# Patient Record
Sex: Female | Born: 1955 | Smoking: Former smoker
Health system: Southern US, Community
[De-identification: ages and names within clinical notes are randomized; demographics above are authoritative.]

## PROBLEM LIST (undated history)

## (undated) DIAGNOSIS — E785 Hyperlipidemia, unspecified: Secondary | ICD-10-CM

## (undated) DIAGNOSIS — I10 Essential (primary) hypertension: Secondary | ICD-10-CM

## (undated) DIAGNOSIS — Z860100 Personal history of colon polyps, unspecified: Secondary | ICD-10-CM

## (undated) DIAGNOSIS — K8020 Calculus of gallbladder without cholecystitis without obstruction: Secondary | ICD-10-CM

## (undated) DIAGNOSIS — M2550 Pain in unspecified joint: Secondary | ICD-10-CM

## (undated) DIAGNOSIS — I2510 Atherosclerotic heart disease of native coronary artery without angina pectoris: Secondary | ICD-10-CM

## (undated) DIAGNOSIS — M2540 Effusion, unspecified joint: Secondary | ICD-10-CM

## (undated) DIAGNOSIS — E669 Obesity, unspecified: Secondary | ICD-10-CM

## (undated) DIAGNOSIS — K5730 Diverticulosis of large intestine without perforation or abscess without bleeding: Secondary | ICD-10-CM

## (undated) DIAGNOSIS — L719 Rosacea, unspecified: Secondary | ICD-10-CM

## (undated) DIAGNOSIS — R42 Dizziness and giddiness: Secondary | ICD-10-CM

## (undated) DIAGNOSIS — H269 Unspecified cataract: Secondary | ICD-10-CM

## (undated) DIAGNOSIS — J302 Other seasonal allergic rhinitis: Secondary | ICD-10-CM

## (undated) DIAGNOSIS — T7840XA Allergy, unspecified, initial encounter: Secondary | ICD-10-CM

## (undated) DIAGNOSIS — T783XXA Angioneurotic edema, initial encounter: Secondary | ICD-10-CM

## (undated) DIAGNOSIS — Z862 Personal history of diseases of the blood and blood-forming organs and certain disorders involving the immune mechanism: Secondary | ICD-10-CM

## (undated) DIAGNOSIS — K648 Other hemorrhoids: Secondary | ICD-10-CM

## (undated) DIAGNOSIS — R112 Nausea with vomiting, unspecified: Secondary | ICD-10-CM

## (undated) DIAGNOSIS — K589 Irritable bowel syndrome without diarrhea: Secondary | ICD-10-CM

## (undated) DIAGNOSIS — K219 Gastro-esophageal reflux disease without esophagitis: Secondary | ICD-10-CM

## (undated) DIAGNOSIS — H9110 Presbycusis, unspecified ear: Secondary | ICD-10-CM

## (undated) DIAGNOSIS — M519 Unspecified thoracic, thoracolumbar and lumbosacral intervertebral disc disorder: Secondary | ICD-10-CM

## (undated) DIAGNOSIS — M949 Disorder of cartilage, unspecified: Secondary | ICD-10-CM

## (undated) DIAGNOSIS — L853 Xerosis cutis: Secondary | ICD-10-CM

## (undated) DIAGNOSIS — I219 Acute myocardial infarction, unspecified: Secondary | ICD-10-CM

## (undated) DIAGNOSIS — M1990 Unspecified osteoarthritis, unspecified site: Secondary | ICD-10-CM

## (undated) DIAGNOSIS — M549 Dorsalgia, unspecified: Secondary | ICD-10-CM

## (undated) DIAGNOSIS — G8929 Other chronic pain: Secondary | ICD-10-CM

## (undated) DIAGNOSIS — F411 Generalized anxiety disorder: Secondary | ICD-10-CM

## (undated) DIAGNOSIS — Z8639 Personal history of other endocrine, nutritional and metabolic disease: Secondary | ICD-10-CM

## (undated) DIAGNOSIS — Z8719 Personal history of other diseases of the digestive system: Secondary | ICD-10-CM

## (undated) DIAGNOSIS — Z9889 Other specified postprocedural states: Secondary | ICD-10-CM

## (undated) DIAGNOSIS — M899 Disorder of bone, unspecified: Secondary | ICD-10-CM

## (undated) HISTORY — DX: Angioneurotic edema, initial encounter: T78.3XXA

## (undated) HISTORY — DX: Obesity, unspecified: E66.9

## (undated) HISTORY — DX: Unspecified thoracic, thoracolumbar and lumbosacral intervertebral disc disorder: M51.9

## (undated) HISTORY — PX: ESOPHAGOGASTRODUODENOSCOPY: SHX1529

## (undated) HISTORY — DX: Disorder of bone, unspecified: M89.9

## (undated) HISTORY — DX: Allergy, unspecified, initial encounter: T78.40XA

## (undated) HISTORY — PX: COLONOSCOPY: SHX174

## (undated) HISTORY — DX: Diverticulosis of large intestine without perforation or abscess without bleeding: K57.30

## (undated) HISTORY — DX: Gastro-esophageal reflux disease without esophagitis: K21.9

## (undated) HISTORY — DX: Presbycusis, unspecified ear: H91.10

## (undated) HISTORY — DX: Irritable bowel syndrome, unspecified: K58.9

## (undated) HISTORY — PX: POLYPECTOMY: SHX149

## (undated) HISTORY — DX: Personal history of other endocrine, nutritional and metabolic disease: Z86.39

## (undated) HISTORY — PX: TONSILLECTOMY: SHX5217

## (undated) HISTORY — DX: Hyperlipidemia, unspecified: E78.5

## (undated) HISTORY — PX: CARDIAC CATHETERIZATION: SHX172

## (undated) HISTORY — DX: Personal history of diseases of the blood and blood-forming organs and certain disorders involving the immune mechanism: Z86.2

## (undated) HISTORY — PX: TONSILLECTOMY: SUR1361

## (undated) HISTORY — DX: Generalized anxiety disorder: F41.1

## (undated) HISTORY — DX: Calculus of gallbladder without cholecystitis without obstruction: K80.20

## (undated) HISTORY — DX: Essential (primary) hypertension: I10

## (undated) HISTORY — DX: Disorder of cartilage, unspecified: M94.9

## (undated) HISTORY — PX: OTHER SURGICAL HISTORY: SHX169

---

## 1998-08-22 ENCOUNTER — Other Ambulatory Visit: Admission: RE | Admit: 1998-08-22 | Discharge: 1998-08-22 | Payer: Self-pay | Admitting: *Deleted

## 1998-09-21 ENCOUNTER — Other Ambulatory Visit: Admission: RE | Admit: 1998-09-21 | Discharge: 1998-09-21 | Payer: Self-pay | Admitting: Obstetrics and Gynecology

## 1998-09-27 ENCOUNTER — Ambulatory Visit (HOSPITAL_COMMUNITY): Admission: RE | Admit: 1998-09-27 | Discharge: 1998-09-27 | Payer: Self-pay | Admitting: Obstetrics and Gynecology

## 1999-03-14 ENCOUNTER — Encounter: Admission: RE | Admit: 1999-03-14 | Discharge: 1999-06-12 | Payer: Self-pay | Admitting: Internal Medicine

## 1999-04-17 ENCOUNTER — Encounter: Payer: Self-pay | Admitting: Internal Medicine

## 1999-06-09 ENCOUNTER — Other Ambulatory Visit: Admission: RE | Admit: 1999-06-09 | Discharge: 1999-06-09 | Payer: Self-pay | Admitting: *Deleted

## 1999-06-27 ENCOUNTER — Encounter: Admission: RE | Admit: 1999-06-27 | Discharge: 1999-06-27 | Payer: Self-pay | Admitting: Internal Medicine

## 1999-06-27 ENCOUNTER — Encounter: Payer: Self-pay | Admitting: Internal Medicine

## 2000-12-06 ENCOUNTER — Other Ambulatory Visit: Admission: RE | Admit: 2000-12-06 | Discharge: 2000-12-06 | Payer: Self-pay | Admitting: *Deleted

## 2001-11-20 ENCOUNTER — Encounter: Admission: RE | Admit: 2001-11-20 | Discharge: 2001-11-20 | Payer: Self-pay | Admitting: Internal Medicine

## 2001-11-20 ENCOUNTER — Encounter: Payer: Self-pay | Admitting: Internal Medicine

## 2002-01-22 ENCOUNTER — Other Ambulatory Visit: Admission: RE | Admit: 2002-01-22 | Discharge: 2002-01-22 | Payer: Self-pay | Admitting: *Deleted

## 2003-06-24 ENCOUNTER — Other Ambulatory Visit: Admission: RE | Admit: 2003-06-24 | Discharge: 2003-06-24 | Payer: Self-pay | Admitting: *Deleted

## 2004-06-09 ENCOUNTER — Encounter: Admission: RE | Admit: 2004-06-09 | Discharge: 2004-06-09 | Payer: Self-pay | Admitting: *Deleted

## 2004-07-11 ENCOUNTER — Ambulatory Visit: Payer: Self-pay | Admitting: Internal Medicine

## 2004-07-18 ENCOUNTER — Ambulatory Visit: Payer: Self-pay | Admitting: Internal Medicine

## 2005-06-11 ENCOUNTER — Encounter: Admission: RE | Admit: 2005-06-11 | Discharge: 2005-06-11 | Payer: Self-pay | Admitting: *Deleted

## 2005-09-03 ENCOUNTER — Ambulatory Visit: Payer: Self-pay | Admitting: Internal Medicine

## 2005-10-15 ENCOUNTER — Encounter: Admission: RE | Admit: 2005-10-15 | Discharge: 2005-10-15 | Payer: Self-pay | Admitting: Internal Medicine

## 2006-06-19 ENCOUNTER — Encounter: Admission: RE | Admit: 2006-06-19 | Discharge: 2006-06-19 | Payer: Self-pay | Admitting: *Deleted

## 2006-09-23 ENCOUNTER — Ambulatory Visit: Payer: Self-pay | Admitting: Internal Medicine

## 2006-09-23 LAB — CONVERTED CEMR LAB
ALT: 30 units/L (ref 0–40)
AST: 26 units/L (ref 0–37)
Albumin: 4.1 g/dL (ref 3.5–5.2)
Alkaline Phosphatase: 58 units/L (ref 39–117)
BUN: 16 mg/dL (ref 6–23)
Basophils Absolute: 0 10*3/uL (ref 0.0–0.1)
Basophils Relative: 0.8 % (ref 0.0–1.0)
Bilirubin Urine: NEGATIVE
Bilirubin, Direct: 0.1 mg/dL (ref 0.0–0.3)
CO2: 34 meq/L — ABNORMAL HIGH (ref 19–32)
Calcium: 9.4 mg/dL (ref 8.4–10.5)
Chloride: 107 meq/L (ref 96–112)
Cholesterol: 225 mg/dL (ref 0–200)
Creatinine, Ser: 0.6 mg/dL (ref 0.4–1.2)
Direct LDL: 126.6 mg/dL
Eosinophils Absolute: 0.1 10*3/uL (ref 0.0–0.6)
Eosinophils Relative: 1.9 % (ref 0.0–5.0)
GFR calc Af Amer: 134 mL/min
GFR calc non Af Amer: 111 mL/min
Glucose, Bld: 97 mg/dL (ref 70–99)
HCT: 42.3 % (ref 36.0–46.0)
HDL: 41.3 mg/dL (ref >39.0)
Hemoglobin, Urine: NEGATIVE
Hemoglobin: 14.4 g/dL (ref 12.0–15.0)
Ketones, ur: NEGATIVE mg/dL
Leukocytes, UA: NEGATIVE
Lymphocytes Relative: 32 % (ref 12.0–46.0)
MCHC: 34.2 g/dL (ref 30.0–36.0)
MCV: 89.8 fL (ref 78.0–100.0)
Monocytes Absolute: 0.3 10*3/uL (ref 0.2–0.7)
Monocytes Relative: 7.7 % (ref 3.0–11.0)
Neutro Abs: 2.3 10*3/uL (ref 1.4–7.7)
Neutrophils Relative %: 57.6 % (ref 43.0–77.0)
Nitrite: NEGATIVE
Platelets: 259 10*3/uL (ref 150–400)
Potassium: 4.4 meq/L (ref 3.5–5.1)
RBC: 4.71 M/uL (ref 3.87–5.11)
RDW: 11.7 % (ref 11.5–14.6)
Sodium: 143 meq/L (ref 135–145)
Specific Gravity, Urine: 1.02 (ref 1.000–1.03)
TSH: 2.28 microintl units/mL (ref 0.35–5.50)
Total Bilirubin: 1 mg/dL (ref 0.3–1.2)
Total CHOL/HDL Ratio: 5.4
Total Protein, Urine: NEGATIVE mg/dL
Total Protein: 7.1 g/dL (ref 6.0–8.3)
Triglycerides: 303 mg/dL (ref 0–149)
Urine Glucose: NEGATIVE mg/dL
Urobilinogen, UA: 0.2 (ref 0.0–1.0)
VLDL: 61 mg/dL — ABNORMAL HIGH (ref 0–40)
WBC: 4 10*3/uL — ABNORMAL LOW (ref 4.5–10.5)
pH: 6.5 (ref 5.0–8.0)

## 2006-10-02 ENCOUNTER — Ambulatory Visit: Payer: Self-pay | Admitting: Internal Medicine

## 2006-10-31 ENCOUNTER — Ambulatory Visit: Payer: Self-pay | Admitting: Internal Medicine

## 2006-12-04 ENCOUNTER — Ambulatory Visit: Payer: Self-pay | Admitting: Internal Medicine

## 2006-12-12 ENCOUNTER — Encounter: Admission: RE | Admit: 2006-12-12 | Discharge: 2006-12-12 | Payer: Self-pay | Admitting: Internal Medicine

## 2006-12-16 ENCOUNTER — Ambulatory Visit: Payer: Self-pay | Admitting: Internal Medicine

## 2006-12-16 LAB — CONVERTED CEMR LAB
Fecal Occult Blood: NEGATIVE
OCCULT 1: NEGATIVE
OCCULT 2: NEGATIVE
OCCULT 3: NEGATIVE
OCCULT 4: NEGATIVE
OCCULT 5: NEGATIVE

## 2007-01-08 ENCOUNTER — Encounter: Payer: Self-pay | Admitting: Internal Medicine

## 2007-01-08 LAB — CONVERTED CEMR LAB
Fecal Occult Blood: NEGATIVE
OCCULT 1: NEGATIVE
OCCULT 2: NEGATIVE
OCCULT 3: NEGATIVE
OCCULT 4: NEGATIVE
OCCULT 5: NEGATIVE

## 2007-06-20 ENCOUNTER — Encounter: Admission: RE | Admit: 2007-06-20 | Discharge: 2007-06-20 | Payer: Self-pay | Admitting: Obstetrics and Gynecology

## 2007-09-08 ENCOUNTER — Telehealth: Payer: Self-pay | Admitting: Internal Medicine

## 2007-10-21 ENCOUNTER — Ambulatory Visit: Payer: Self-pay | Admitting: Internal Medicine

## 2007-10-21 LAB — CONVERTED CEMR LAB
ALT: 57 units/L — ABNORMAL HIGH (ref 0–35)
AST: 43 units/L — ABNORMAL HIGH (ref 0–37)
Albumin: 4.2 g/dL (ref 3.5–5.2)
Alkaline Phosphatase: 53 units/L (ref 39–117)
BUN: 17 mg/dL (ref 6–23)
Basophils Absolute: 0 10*3/uL (ref 0.0–0.1)
Basophils Relative: 0.4 % (ref 0.0–3.0)
Bilirubin Urine: NEGATIVE
Bilirubin, Direct: 0.1 mg/dL (ref 0.0–0.3)
CO2: 27 meq/L (ref 19–32)
Calcium: 9.3 mg/dL (ref 8.4–10.5)
Chloride: 99 meq/L (ref 96–112)
Cholesterol: 286 mg/dL (ref 0–200)
Creatinine, Ser: 0.7 mg/dL (ref 0.4–1.2)
Direct LDL: 159.5 mg/dL
Eosinophils Absolute: 0.1 10*3/uL (ref 0.0–0.7)
Eosinophils Relative: 2 % (ref 0.0–5.0)
GFR calc Af Amer: 112 mL/min
GFR calc non Af Amer: 93 mL/min
Glucose, Bld: 102 mg/dL — ABNORMAL HIGH (ref 70–99)
HCT: 40.8 % (ref 36.0–46.0)
HDL: 34.4 mg/dL — ABNORMAL LOW (ref >39.0)
Hemoglobin, Urine: NEGATIVE
Hemoglobin: 14.3 g/dL (ref 12.0–15.0)
Ketones, ur: NEGATIVE mg/dL
Leukocytes, UA: NEGATIVE
Lymphocytes Relative: 34.8 % (ref 12.0–46.0)
MCHC: 35.1 g/dL (ref 30.0–36.0)
MCV: 89.7 fL (ref 78.0–100.0)
Monocytes Absolute: 0.3 10*3/uL (ref 0.1–1.0)
Monocytes Relative: 8.6 % (ref 3.0–12.0)
Neutro Abs: 1.9 10*3/uL (ref 1.4–7.7)
Neutrophils Relative %: 54.2 % (ref 43.0–77.0)
Nitrite: NEGATIVE
Platelets: 247 10*3/uL (ref 150–400)
Potassium: 3.6 meq/L (ref 3.5–5.1)
RBC: 4.55 M/uL (ref 3.87–5.11)
RDW: 12.4 % (ref 11.5–14.6)
Sodium: 136 meq/L (ref 135–145)
Specific Gravity, Urine: 1.015 (ref 1.000–1.03)
TSH: 2.42 microintl units/mL (ref 0.35–5.50)
Total Bilirubin: 0.9 mg/dL (ref 0.3–1.2)
Total CHOL/HDL Ratio: 8.3
Total Protein, Urine: NEGATIVE mg/dL
Total Protein: 7.1 g/dL (ref 6.0–8.3)
Triglycerides: 404 mg/dL (ref 0–149)
Urine Glucose: NEGATIVE mg/dL
Urobilinogen, UA: 0.2 (ref 0.0–1.0)
VLDL: 81 mg/dL — ABNORMAL HIGH (ref 0–40)
WBC: 3.5 10*3/uL — ABNORMAL LOW (ref 4.5–10.5)
pH: 6 (ref 5.0–8.0)

## 2007-10-28 ENCOUNTER — Encounter: Payer: Self-pay | Admitting: Internal Medicine

## 2007-10-28 DIAGNOSIS — K5730 Diverticulosis of large intestine without perforation or abscess without bleeding: Secondary | ICD-10-CM | POA: Insufficient documentation

## 2007-10-28 DIAGNOSIS — I10 Essential (primary) hypertension: Secondary | ICD-10-CM | POA: Insufficient documentation

## 2007-10-28 DIAGNOSIS — K219 Gastro-esophageal reflux disease without esophagitis: Secondary | ICD-10-CM | POA: Insufficient documentation

## 2007-10-28 DIAGNOSIS — E785 Hyperlipidemia, unspecified: Secondary | ICD-10-CM

## 2007-10-28 DIAGNOSIS — F411 Generalized anxiety disorder: Secondary | ICD-10-CM

## 2007-10-28 DIAGNOSIS — E1169 Type 2 diabetes mellitus with other specified complication: Secondary | ICD-10-CM | POA: Insufficient documentation

## 2007-10-28 HISTORY — DX: Hyperlipidemia, unspecified: E78.5

## 2007-10-28 HISTORY — DX: Essential (primary) hypertension: I10

## 2007-10-28 HISTORY — DX: Diverticulosis of large intestine without perforation or abscess without bleeding: K57.30

## 2007-10-28 HISTORY — DX: Generalized anxiety disorder: F41.1

## 2007-10-28 HISTORY — DX: Gastro-esophageal reflux disease without esophagitis: K21.9

## 2007-10-29 ENCOUNTER — Ambulatory Visit: Payer: Self-pay | Admitting: Internal Medicine

## 2007-10-29 DIAGNOSIS — Z8639 Personal history of other endocrine, nutritional and metabolic disease: Secondary | ICD-10-CM

## 2007-10-29 DIAGNOSIS — M899 Disorder of bone, unspecified: Secondary | ICD-10-CM | POA: Insufficient documentation

## 2007-10-29 DIAGNOSIS — Z862 Personal history of diseases of the blood and blood-forming organs and certain disorders involving the immune mechanism: Secondary | ICD-10-CM

## 2007-10-29 DIAGNOSIS — M949 Disorder of cartilage, unspecified: Secondary | ICD-10-CM

## 2007-10-29 HISTORY — DX: Disorder of bone, unspecified: M89.9

## 2007-10-29 HISTORY — DX: Personal history of other endocrine, nutritional and metabolic disease: Z86.2

## 2007-10-29 HISTORY — DX: Personal history of other endocrine, nutritional and metabolic disease: Z86.39

## 2008-12-08 ENCOUNTER — Ambulatory Visit: Payer: Self-pay | Admitting: Internal Medicine

## 2008-12-08 LAB — CONVERTED CEMR LAB
ALT: 51 units/L — ABNORMAL HIGH (ref 0–35)
AST: 36 units/L (ref 0–37)
Albumin: 4 g/dL (ref 3.5–5.2)
Alkaline Phosphatase: 55 units/L (ref 39–117)
BUN: 17 mg/dL (ref 6–23)
Basophils Absolute: 0 10*3/uL (ref 0.0–0.1)
Basophils Relative: 0.6 % (ref 0.0–3.0)
Bilirubin Urine: NEGATIVE
Bilirubin, Direct: 0.1 mg/dL (ref 0.0–0.3)
CO2: 32 meq/L (ref 19–32)
Calcium: 9.3 mg/dL (ref 8.4–10.5)
Chloride: 103 meq/L (ref 96–112)
Cholesterol: 286 mg/dL — ABNORMAL HIGH (ref 0–200)
Creatinine, Ser: 0.7 mg/dL (ref 0.4–1.2)
Direct LDL: 180.7 mg/dL
Eosinophils Absolute: 0.1 10*3/uL (ref 0.0–0.7)
Eosinophils Relative: 2.1 % (ref 0.0–5.0)
GFR calc non Af Amer: 92.17 mL/min (ref >60)
Glucose, Bld: 99 mg/dL (ref 70–99)
HCT: 40.5 % (ref 36.0–46.0)
HDL: 37.6 mg/dL — ABNORMAL LOW (ref >39.00)
Hemoglobin: 13.8 g/dL (ref 12.0–15.0)
Ketones, ur: NEGATIVE mg/dL
Leukocytes, UA: NEGATIVE
Lymphocytes Relative: 34.8 % (ref 12.0–46.0)
Lymphs Abs: 1 10*3/uL (ref 0.7–4.0)
MCHC: 34.1 g/dL (ref 30.0–36.0)
MCV: 91.5 fL (ref 78.0–100.0)
Monocytes Absolute: 0.3 10*3/uL (ref 0.1–1.0)
Monocytes Relative: 9.2 % (ref 3.0–12.0)
Neutro Abs: 1.5 10*3/uL (ref 1.4–7.7)
Neutrophils Relative %: 53.3 % (ref 43.0–77.0)
Nitrite: NEGATIVE
Platelets: 243 10*3/uL (ref 150.0–400.0)
Potassium: 4.1 meq/L (ref 3.5–5.1)
RBC: 4.42 M/uL (ref 3.87–5.11)
RDW: 11.8 % (ref 11.5–14.6)
Sodium: 141 meq/L (ref 135–145)
Specific Gravity, Urine: >=1.03 (ref 1.000–1.030)
TSH: 2.24 microintl units/mL (ref 0.35–5.50)
Total Bilirubin: 1 mg/dL (ref 0.3–1.2)
Total CHOL/HDL Ratio: 8
Total Protein, Urine: NEGATIVE mg/dL
Total Protein: 6.9 g/dL (ref 6.0–8.3)
Triglycerides: 263 mg/dL — ABNORMAL HIGH (ref 0.0–149.0)
Urine Glucose: NEGATIVE mg/dL
Urobilinogen, UA: 0.2 (ref 0.0–1.0)
VLDL: 52.6 mg/dL — ABNORMAL HIGH (ref 0.0–40.0)
WBC: 2.9 10*3/uL — ABNORMAL LOW (ref 4.5–10.5)
pH: 5.5 (ref 5.0–8.0)

## 2008-12-09 LAB — CONVERTED CEMR LAB
Calcium, Total (PTH): 9.4 mg/dL (ref 8.4–10.5)
HCV Ab: NEGATIVE
Hep A IgM: NEGATIVE
Hep B C IgM: NEGATIVE
Hepatitis B Surface Ag: NEGATIVE
PTH: 55.1 pg/mL (ref 14.0–72.0)

## 2008-12-14 ENCOUNTER — Telehealth: Payer: Self-pay | Admitting: Internal Medicine

## 2008-12-14 ENCOUNTER — Ambulatory Visit: Payer: Self-pay | Admitting: Internal Medicine

## 2009-01-11 ENCOUNTER — Ambulatory Visit: Payer: Self-pay | Admitting: Internal Medicine

## 2009-01-12 LAB — CONVERTED CEMR LAB
ALT: 42 units/L — ABNORMAL HIGH (ref 0–35)
AST: 32 units/L (ref 0–37)
Albumin: 4.1 g/dL (ref 3.5–5.2)
Alkaline Phosphatase: 51 units/L (ref 39–117)
Bilirubin, Direct: 0 mg/dL (ref 0.0–0.3)
Cholesterol: 245 mg/dL — ABNORMAL HIGH (ref 0–200)
Direct LDL: 158.1 mg/dL
HDL: 36.4 mg/dL — ABNORMAL LOW (ref >39.00)
Total Bilirubin: 0.9 mg/dL (ref 0.3–1.2)
Total CHOL/HDL Ratio: 7
Total Protein: 7.1 g/dL (ref 6.0–8.3)
Triglycerides: 215 mg/dL — ABNORMAL HIGH (ref 0.0–149.0)
VLDL: 43 mg/dL — ABNORMAL HIGH (ref 0.0–40.0)

## 2009-05-10 ENCOUNTER — Encounter: Payer: Self-pay | Admitting: Internal Medicine

## 2009-10-25 ENCOUNTER — Telehealth: Payer: Self-pay | Admitting: Internal Medicine

## 2009-12-30 ENCOUNTER — Ambulatory Visit: Payer: Self-pay | Admitting: Internal Medicine

## 2009-12-30 LAB — CONVERTED CEMR LAB
ALT: 41 units/L — ABNORMAL HIGH (ref 0–35)
AST: 35 units/L (ref 0–37)
Albumin: 4.4 g/dL (ref 3.5–5.2)
Alkaline Phosphatase: 61 units/L (ref 39–117)
BUN: 21 mg/dL (ref 6–23)
Basophils Absolute: 0 10*3/uL (ref 0.0–0.1)
Basophils Relative: 0.5 % (ref 0.0–3.0)
Bilirubin Urine: NEGATIVE
Bilirubin, Direct: 0.1 mg/dL (ref 0.0–0.3)
CO2: 30 meq/L (ref 19–32)
Calcium: 9.4 mg/dL (ref 8.4–10.5)
Chloride: 100 meq/L (ref 96–112)
Cholesterol: 243 mg/dL — ABNORMAL HIGH (ref 0–200)
Creatinine, Ser: 0.6 mg/dL (ref 0.4–1.2)
Direct LDL: 171.8 mg/dL
Eosinophils Absolute: 0.1 10*3/uL (ref 0.0–0.7)
Eosinophils Relative: 1.5 % (ref 0.0–5.0)
GFR calc non Af Amer: 111.84 mL/min (ref >60)
Glucose, Bld: 103 mg/dL — ABNORMAL HIGH (ref 70–99)
HCT: 39.9 % (ref 36.0–46.0)
HDL: 40.4 mg/dL (ref >39.00)
Hemoglobin, Urine: NEGATIVE
Hemoglobin: 13.9 g/dL (ref 12.0–15.0)
Ketones, ur: NEGATIVE mg/dL
Lymphocytes Relative: 37.9 % (ref 12.0–46.0)
Lymphs Abs: 1.3 10*3/uL (ref 0.7–4.0)
MCHC: 34.8 g/dL (ref 30.0–36.0)
MCV: 90.9 fL (ref 78.0–100.0)
Monocytes Absolute: 0.3 10*3/uL (ref 0.1–1.0)
Monocytes Relative: 9.4 % (ref 3.0–12.0)
Neutro Abs: 1.7 10*3/uL (ref 1.4–7.7)
Neutrophils Relative %: 50.7 % (ref 43.0–77.0)
Nitrite: NEGATIVE
Platelets: 232 10*3/uL (ref 150.0–400.0)
Potassium: 4.4 meq/L (ref 3.5–5.1)
RBC: 4.39 M/uL (ref 3.87–5.11)
RDW: 12.2 % (ref 11.5–14.6)
Sodium: 138 meq/L (ref 135–145)
Specific Gravity, Urine: 1.015 (ref 1.000–1.030)
TSH: 2.73 microintl units/mL (ref 0.35–5.50)
Total Bilirubin: 0.9 mg/dL (ref 0.3–1.2)
Total CHOL/HDL Ratio: 6
Total Protein, Urine: NEGATIVE mg/dL
Total Protein: 7.2 g/dL (ref 6.0–8.3)
Triglycerides: 177 mg/dL — ABNORMAL HIGH (ref 0.0–149.0)
Urine Glucose: NEGATIVE mg/dL
Urobilinogen, UA: 0.2 (ref 0.0–1.0)
VLDL: 35.4 mg/dL (ref 0.0–40.0)
WBC: 3.3 10*3/uL — ABNORMAL LOW (ref 4.5–10.5)
pH: 6.5 (ref 5.0–8.0)

## 2010-01-04 ENCOUNTER — Encounter: Payer: Self-pay | Admitting: Internal Medicine

## 2010-01-04 ENCOUNTER — Ambulatory Visit: Payer: Self-pay | Admitting: Internal Medicine

## 2010-01-04 DIAGNOSIS — H9110 Presbycusis, unspecified ear: Secondary | ICD-10-CM

## 2010-01-04 HISTORY — DX: Presbycusis, unspecified ear: H91.10

## 2010-01-11 ENCOUNTER — Encounter: Admission: RE | Admit: 2010-01-11 | Discharge: 2010-01-11 | Payer: Self-pay | Admitting: Obstetrics and Gynecology

## 2010-01-16 ENCOUNTER — Encounter (INDEPENDENT_AMBULATORY_CARE_PROVIDER_SITE_OTHER): Payer: Self-pay | Admitting: *Deleted

## 2010-01-19 ENCOUNTER — Encounter: Admission: RE | Admit: 2010-01-19 | Discharge: 2010-01-19 | Payer: Self-pay | Admitting: Obstetrics and Gynecology

## 2010-01-25 ENCOUNTER — Encounter: Admission: RE | Admit: 2010-01-25 | Discharge: 2010-01-25 | Payer: Self-pay | Admitting: Internal Medicine

## 2010-02-03 ENCOUNTER — Encounter: Admission: RE | Admit: 2010-02-03 | Discharge: 2010-02-03 | Payer: Self-pay | Admitting: Obstetrics and Gynecology

## 2010-04-10 ENCOUNTER — Ambulatory Visit: Admit: 2010-04-10 | Payer: Self-pay | Admitting: Internal Medicine

## 2010-04-22 ENCOUNTER — Encounter: Payer: Self-pay | Admitting: Obstetrics and Gynecology

## 2010-04-23 ENCOUNTER — Encounter: Payer: Self-pay | Admitting: Internal Medicine

## 2010-05-02 NOTE — Progress Notes (Signed)
Summary: rx/ divon   Phone Note Call from Patient Call back at Home Phone [phone]   Caller: Patient/[phone] Call For: Dr Jonny Ruiz Summary of Call: per pt call requseting an rx for Divon for mail order requseting a 90 day supply ,pt states she has an appt scheduled for July 21,09   Patient's chart has been requested. please CALL patient when ready for pick up    Initial call taken by: Shelbie Proctor,  September 08, 2007 9:52 AM    New/Updated Medications: DIOVAN HCT 160-12.5 MG  TABS (VALSARTAN-HYDROCHLOROTHIAZIDE) 1 by mouth qd   Prescriptions: DIOVAN HCT 160-12.5 MG  TABS (VALSARTAN-HYDROCHLOROTHIAZIDE) 1 by mouth qd  #90 x 3   Entered by:   Maris Berger   Authorized by:   Corwin Levins MD   Signed by:   Maris Berger on 09/08/2007   Method used:   Print then Give to Patient   RxID:   [id number]

## 2010-05-02 NOTE — Assessment & Plan Note (Signed)
Summary: PHYSICAL-$50-STC   Vital Signs:  Patient Profile:   55 Years Old Female Weight:      179 pounds Temp:     98.0 degrees F oral Pulse rate:   73 / minute BP sitting:   128 / 85  (right arm) Cuff size:   regular  Vitals Entered By: Jerilynn Mages (October 29, 2007 1:12 PM)                 Preventive Care Screening  Colonoscopy:    Date:  04/17/1999    Next Due:  04/2009    Results:  Diverticulosis    Chief Complaint:  CPX.  History of Present Illness: overall doing well, did not take the pravachol since last visit, but is willing to try now    Updated Prior Medication List: DIOVAN HCT 160-12.5 MG  TABS (VALSARTAN-HYDROCHLOROTHIAZIDE) 1 by mouth once daily OMEPRAZOLE 20 MG  CPDR (OMEPRAZOLE) 2 by mouth once daily PRAVACHOL 40 MG  TABS (PRAVASTATIN SODIUM) TAKE 2 by mouth QD CLARINEX 5 MG  TABS (DESLORATADINE) Take 1 by mouth once daily  Current Allergies (reviewed today): ! SULFA ! PENICILLIN ! LIPITOR ! ZOCOR ! CRESTOR  Past Medical History:    Reviewed history from 10/28/2007 and no changes required:       GERD       Hyperlipidemia       Anxiety       IBS       Diverticulosis       Obesity       Migraine       Diverticulosis, colon       Hypertension       low vit D       Osteopenia       hx of lumbar disc disease  Past Surgical History:    Reviewed history from 06/26/2007 and no changes required:       Tonsillectomy   Family History:    Reviewed history and no changes required:       mother with afib, elev chol, stroke       father with afib       grandmother and sister with low thyroid  Social History:    Reviewed history and no changes required:       Former Smoker       Alcohol use-yes       Married       work - Educational psychologist       no children   Risk Factors:  Tobacco use:  quit Alcohol use:  yes  Colonoscopy History:     Date of Last Colonoscopy:  04/17/1999    Results:  Diverticulosis    Review of Systems   all otherwise negative    Physical Exam  General:     alert and overweight-appearing.   Head:     Normocephalic and atraumatic without obvious abnormalities. No apparent alopecia or balding. Eyes:     No corneal or conjunctival inflammation noted. EOMI. Perrla.  Ears:     External ear exam shows no significant lesions or deformities.  Otoscopic examination reveals clear canals, tympanic membranes are intact bilaterally without bulging, retraction, inflammation or discharge. Hearing is grossly normal bilaterally. Nose:     External nasal examination shows no deformity or inflammation. Nasal mucosa are pink and moist without lesions or exudates. Mouth:     Oral mucosa and oropharynx without lesions or exudates.  Teeth in good repair. Neck:  No deformities, masses, or tenderness noted. Lungs:     Normal respiratory effort, chest expands symmetrically. Lungs are clear to auscultation, no crackles or wheezes. Heart:     Normal rate and regular rhythm. S1 and S2 normal without gallop, murmur, click, rub or other extra sounds. Abdomen:     Bowel sounds positive,abdomen soft and non-tender without masses, organomegaly or hernias noted. Msk:     No deformity or scoliosis noted of thoracic or lumbar spine.   Extremities:     No clubbing, cyanosis, edema, or deformity noted with normal full range of motion of all joints.   Neurologic:     No cranial nerve deficits noted. Station and gait are normal. Plantar reflexes are down-going bilaterally. DTRs are symmetrical throughout. Sensory, motor and coordinative functions appear intact.    Impression & Recommendations:  Problem # 1:  Preventive Health Care (ICD-V70.0) Overall doing well, up to date, counseled on routine health concerns for screening and prevention, immunizations up to date or declined, labs reviewed, ecg reviewed  Orders: EKG w/ Interpretation (93000)   Problem # 2:  HYPERTENSION (ICD-401.9)  Her updated medication  list for this problem includes:    Diovan Hct 160-12.5 Mg Tabs (Valsartan-hydrochlorothiazide) .Marland Kitchen... 1 by mouth once daily  BP today: 128/85 Prior BP: 114/70 (10/31/2006)  Labs Reviewed: Creat: 0.7 (10/21/2007) Chol: 286 (10/21/2007)   HDL: 34.4 (10/21/2007)   LDL: DEL (10/21/2007)   TG: 404 (10/21/2007) stable overall by hx and exam, ok to continue meds/tx as is   Complete Medication List: 1)  Diovan Hct 160-12.5 Mg Tabs (Valsartan-hydrochlorothiazide) .Marland Kitchen.. 1 by mouth once daily 2)  Omeprazole 20 Mg Cpdr (Omeprazole) .... 2 by mouth once daily 3)  Pravachol 40 Mg Tabs (Pravastatin sodium) .... Take 2 by mouth qd 4)  Clarinex 5 Mg Tabs (Desloratadine) .... Take 1 by mouth once daily   Patient Instructions: 1)  Please take all new medications as prescribed 2)  Continue all medications that you may have been taking previously 3)  Please return for LAB only in 4 wks: 4)  Lipid Panel prior to visit, ICD-9: 272.0 5)  Hepatic Panel prior to visit, ICD-9: v58.69 6)  Acute Hepatitis Panel 794.8 7)  PTH level - parathyroid hormone level - 733.90 8)  Please schedule a follow-up appointment in 1 year.   Prescriptions: CLARINEX 5 MG  TABS (DESLORATADINE) Take 1 by mouth once daily  #90 x 3   Entered and Authorized by:   Corwin Levins MD   Signed by:   Corwin Levins MD on 10/29/2007   Method used:   Print then Give to Patient   RxID:   [id number] DIOVAN HCT 160-12.5 MG  TABS (VALSARTAN-HYDROCHLOROTHIAZIDE) 1 by mouth once daily  #90 x 3   Entered and Authorized by:   Corwin Levins MD   Signed by:   Corwin Levins MD on 10/29/2007   Method used:   Print then Give to Patient   RxID:   [id number] OMEPRAZOLE 20 MG  CPDR (OMEPRAZOLE) 2 by mouth once daily  #180 x 3   Entered and Authorized by:   Corwin Levins MD   Signed by:   Corwin Levins MD on 10/29/2007   Method used:   Print then Give to Patient   RxID:   [id number] PRAVACHOL 40 MG  TABS (PRAVASTATIN SODIUM) TAKE 2 by mouth  QD  #180 x 3   Entered and Authorized by:   Corwin Levins  MD   Signed by:   Corwin Levins MD on 10/29/2007   Method used:   Print then Give to Patient   RxID:   [id number]  ]

## 2010-05-02 NOTE — Consult Note (Signed)
Summary: [facility]  [facility]   Imported By: Lester [place] 05/12/2009 10:29:24  _____________________________________________________________________  External Attachment:    Type:   Image     Comment:   External Document

## 2010-05-02 NOTE — Procedures (Signed)
Summary: Gastroenterology EGD  Gastroenterology EGD   Imported By: Francee Piccolo CMA 06/26/2007 17:11:47  _____________________________________________________________________  External Attachment:    Type:   Image     Comment:   External Document

## 2010-05-02 NOTE — Procedures (Signed)
Summary: Gastroenterology Colon  Gastroenterology Colon   Imported By: Francee Piccolo CMA 06/26/2007 17:11:05  _____________________________________________________________________  External Attachment:    Type:   Image     Comment:   External Document

## 2010-05-02 NOTE — Assessment & Plan Note (Signed)
Summary: CPX /NWS $50   Vital Signs:  Patient profile:   55 year old female Height:      63.75 inches Weight:      181.25 pounds BMI:     31.47 O2 Sat:      97 % Temp:     97.8 degrees F oral Pulse rate:   73 / minute BP sitting:   146 / 82  (left arm) Cuff size:   regular  Vitals Entered By: Windell [place] (December 14, 2008 8:26 AM)  Preventive Care Screening  Last Tetanus Booster:    Date:  04/02/2004    Results:  Tdap      gets flu shot at work  CC: cpx   CC:  cpx.  History of Present Illness: gained  lbs overall, had recent vacation with falling off the wagin on the diet; BP at home usually < 140/90;  Pt denies CP, sob, doe, wheezing, orthopnea, pnd, worsening LE edema, palps, dizziness or syncope   Pt denies new neuro symptoms such as headache, facial or extremity weakness   Trying to follow low chol diet, wt overall stable   Problems Prior to Update: 1)  Preventive Health Care  (ICD-V70.0) 2)  Osteopenia  (ICD-733.90) 3)  Preventive Health Care  (ICD-V70.0) 4)  Liver Function Tests, Abnormal, Hx of  (ICD-V12.2) 5)  Hypertension  (ICD-401.9) 6)  Hyperlipidemia  (ICD-272.4) 7)  Gerd  (ICD-530.81) 8)  Diverticulosis, Colon  (ICD-562.10) 9)  Anxiety  (ICD-300.00)  Medications Prior to Update: 1)  Diovan Hct 160-12.5 Mg  Tabs (Valsartan-Hydrochlorothiazide) .Marland Kitchen.. 1 By Mouth Once Daily 2)  Omeprazole 20 Mg  Cpdr (Omeprazole) .... 2 By Mouth Once Daily 3)  Pravachol 40 Mg  Tabs (Pravastatin Sodium) .... Take 2 By Mouth Qd 4)  Clarinex 5 Mg  Tabs (Desloratadine) .... Take 1 By Mouth Once Daily  Current Medications (verified): 1)  Diovan Hct 160-12.5 Mg  Tabs (Valsartan-Hydrochlorothiazide) .Marland Kitchen.. 1 By Mouth Once Daily 2)  Lansoprazole 30 Mg Cpdr (Lansoprazole) .Marland Kitchen.. 1 By Mouth Once Daily 3)  Pravachol 40 Mg  Tabs (Pravastatin Sodium) .... Take 1 By Mouth Once Daily 4)  Clarinex 5 Mg  Tabs (Desloratadine) .... Take 1 By Mouth Once Daily 5)  Aspir-Low 81 Mg Tbec  (Aspirin) .Marland Kitchen.. 1po Once Daily  Allergies (verified): 1)  ! Sulfa 2)  ! Penicillin 3)  ! Lipitor 4)  ! Zocor 5)  ! 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         all otherwise negative - 12 system review done   Physical Exam  General:  alert and overweight-appearing.   Head:  normocephalic and atraumatic.   Eyes:  vision grossly intact, pupils  equal, and pupils round.   Ears:  R ear normal and L ear normal.   Nose:  no external deformity and no nasal discharge.   Mouth:  no gingival abnormalities and pharynx pink and moist.   Neck:  supple and no masses.   Lungs:  normal respiratory effort and normal breath sounds.   Heart:  normal rate and regular rhythm.   Abdomen:  soft, non-tender, and normal bowel sounds.   Msk:  no joint tenderness and no joint swelling.   Extremities:  no edema, no erythema  Neurologic:  cranial nerves II-XII  intact and strength normal in all extremities.     Impression & Recommendations:  Problem # 1:  Preventive Health Care (ICD-V70.0)  Overall doing well, up to date, counseled on routine health concerns for screening and prevention, immunizations up to date or declined, labs reviewed, ecg reviewed   Orders: EKG w/ Interpretation (93000)  Problem # 2:  HYPERLIPIDEMIA (ICD-272.4)  Her updated medication list for this problem includes:    Pravachol 40 Mg Tabs (Pravastatin sodium) .Marland Kitchen... Take 1 by mouth once daily  Labs Reviewed: SGOT: 36 (12/08/2008)   SGPT: 51 (12/08/2008)   HDL:37.60 (12/08/2008), 34.4 (10/21/2007)  LDL:DEL (10/21/2007), DEL (09/23/2006)  Chol:286 (12/08/2008), 286 (10/21/2007)  Trig:263.0 (12/08/2008), 404 (10/21/2007) start tx, intolerant to other statins, but will try, and check labs 4 wks  Complete Medication List: 1)  Diovan Hct 160-12.5 Mg Tabs (Valsartan-hydrochlorothiazide) .Marland Kitchen.. 1 by mouth once daily 2)  Lansoprazole 30 Mg Cpdr (Lansoprazole) .Marland Kitchen.. 1 by mouth once daily 3)  Pravachol 40 Mg Tabs (Pravastatin sodium) .... Take 1 by mouth once daily 4)  Clarinex 5 Mg Tabs (Desloratadine) .... Take 1 by mouth once daily 5)  Aspir-low 81 Mg Tbec (Aspirin) .Marland Kitchen.. 1po once daily  Patient Instructions: 1)  start the pravachol 40 mg per day 2)  please return for LAB only in 4 wks: 3)  Hepatic Panel prior to visit, ICD-9: v58.69 4)  Lipid Panel prior to visit, ICD-9: 272.0 5)  Take an Aspirin every day  81 mg - 1 per day - COATED only 6)  please followup iwth your GYN as you do for the bone density and  female exams 7)  Please schedule a follow-up appointment in 1 year or sooner if needed Prescriptions: CLARINEX 5 MG  TABS (DESLORATADINE) Take 1 by mouth once daily  #90 x 3   Entered and Authorized by:   Corwin Levins MD   Signed by:   Corwin Levins MD on 12/14/2008   Method used:   Print then Give to Patient   RxID:   [id number] LANSOPRAZOLE 30 MG CPDR  (LANSOPRAZOLE) 1 by mouth once daily  #90 x 3   Entered and Authorized by:   Corwin Levins MD   Signed by:   Corwin Levins MD on 12/14/2008   Method used:   Print then Give to Patient   RxID:   [id number] DIOVAN HCT 160-12.5 MG  TABS (VALSARTAN-HYDROCHLOROTHIAZIDE) 1 by mouth once daily  #90 x 3   Entered and Authorized by:   Corwin Levins MD   Signed by:   Corwin Levins MD on 12/14/2008   Method used:   Print then Give to Patient   RxID:   [id number] PRAVACHOL 40 MG  TABS (PRAVASTATIN SODIUM) TAKE 1 by mouth once daily  #90 x 3   Entered and Authorized by:   Corwin Levins MD   Signed by:  Corwin Levins MD on 12/14/2008   Method used:   Print then Give to Patient   RxID:   [id number]

## 2010-05-02 NOTE — Assessment & Plan Note (Signed)
Summary: CPX / NWS  #   Vital Signs:  Patient profile:   55 year old female Height:      63 inches Weight:      179 pounds BMI:     31.82 O2 Sat:      96 % on Room air Temp:     98.3 degrees F oral Pulse rate:   72 / minute BP sitting:   130 / 80  (left arm) Cuff size:   regular  Vitals Entered By: Zella Ball Ewing CMA (AAMA) (January 04, 2010 1:13 PM)  O2 Flow:  Room air  Preventive Care Screening     due to see GYN in late oct for pap and mamogram   CC: Adult Physical/RE/wellness   CC:  Adult Physical/RE/wellness.  History of Present Illness: here for wellness, overall doing well, presbyacusis persits however, even grad worse - saw ENT fev 2011 who suggested MRI for persistent symptoms;  Pt denies new neuro symptoms such as headache, facial or extremity weakness  Pt denies CP, worsening sob, doe, wheezing, orthopnea, pnd, worsening LE edema, palps, dizziness or syncope  No fever, wt loss, night sweats, loss of appetite or other constitutional symptoms  Pt denies polydipsia, polyuria,   Overall good compliance with meds, trying to follow low chol diet, wt stable, little excercise however   Preventive Screening-Counseling & Management      Drug Use:  no.    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 1 By Mouth Once Daily 2)  Lansoprazole 30 Mg Cpdr (Lansoprazole) .Marland Kitchen.. 1 By Mouth Once Daily 3)  Pravachol 40 Mg  Tabs (Pravastatin Sodium) .... Take 1 By Mouth Once Daily 4)  Clarinex 5 Mg  Tabs (Desloratadine) .... Take 1 By Mouth Once Daily 5)   Aspir-Low 81 Mg Tbec (Aspirin) .Marland Kitchen.. 1po Once Daily  Current Medications (verified): 1)  Diovan Hct 160-12.5 Mg  Tabs (Valsartan-Hydrochlorothiazide) .Marland Kitchen.. 1 By Mouth Once Daily 2)  Lansoprazole 30 Mg Cpdr (Lansoprazole) .Marland Kitchen.. 1 By Mouth Once Daily 3)  Pravachol 40 Mg  Tabs (Pravastatin Sodium) .... Take 1 By Mouth Once Daily 4)  Clarinex 5 Mg  Tabs (Desloratadine) .... Take 1 By Mouth Once Daily 5)  Aspir-Low 81 Mg Tbec (Aspirin) .Marland Kitchen.. 1po Once Daily 6)  Welchol 3.75 Gm Pack (Colesevelam Hcl) .Marland Kitchen.. 1 Pack By Mouth Once Daily in 8 Oz Fluid  Allergies (verified): 1)  ! Sulfa 2)  ! Penicillin 3)  ! Lipitor 4)  ! Zocor 5)  ! 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         all otherwise negative per  pt -    Physical Exam  General:  alert and overweight-appearing.   Head:  normocephalic and atraumatic.   Eyes:  vision grossly intact, pupils equal, and pupils round.   Ears:  R ear normal and L ear normal.   Nose:  no external deformity and no nasal discharge.   Mouth:  no gingival abnormalities and pharynx pink and moist.   Neck:  supple and no masses.   Lungs:  normal respiratory effort and normal breath sounds.   Heart:  normal rate and regular rhythm.   Abdomen:  soft, non-tender, and normal bowel sounds.   Msk:  no joint tenderness and no joint swelling.   Extremities:  no edema, no erythema  Neurologic:  cranial nerves II-XII intact and strength normal in all extremities.   Skin:  color normal and no rashes.   Psych:  not anxious appearing and not depressed appearing.     Impression & Recommendations:  Problem # 1:  Preventive Health Care (ICD-V70.0) Overall doing well, age appropriate education and counseling updated and referral for appropriate preventive services done unless declined, immunizations up to date or declined, diet counseling done if overweight, urged to quit smoking if smokes , most recent labs reviewed and current ordered if appropriate, ecg reviewed or declined (interpretation per ECG scanned in the EMR if done); information regarding Medicare Prevention requirements given if appropriate; speciality referrals updated as appropriate  Orders: EKG w/ Interpretation (93000) Gastroenterology Referral (GI)  Problem # 2:  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 Take 1 by mouth once daily    Welchol 3.75 Gm Pack (Colesevelam hcl) .Marland Kitchen... 1 pack by mouth once daily in 8 oz fluid to  add the welchol for uncontrolled ; Pt to continue diet efforts, good med tolerance; to check labs - goal LDL less than 100   Labs Reviewed: SGOT: 35 (12/30/2009)   SGPT: 41 (12/30/2009)   HDL:40.40 (12/30/2009), 36.40 (01/11/2009)  LDL:DEL (10/21/2007), DEL (09/23/2006)  Chol:243 (12/30/2009), 245 (01/11/2009)  Trig:177.0 (12/30/2009), 215.0 (01/11/2009)  Complete Medication List: 1)  Diovan Hct 160-12.5 Mg Tabs (Valsartan-hydrochlorothiazide) .Marland Kitchen.. 1 by mouth once daily 2)  Lansoprazole 30 Mg Cpdr (Lansoprazole) .Marland Kitchen.. 1 by mouth once daily 3)  Pravachol 40 Mg Tabs (Pravastatin sodium) .... Take 1 by mouth once daily 4)  Clarinex 5 Mg Tabs (Desloratadine) .... Take 1 by mouth once daily 5)  Aspir-low 81 Mg Tbec (Aspirin) .Marland Kitchen.. 1po once daily 6)  Welchol 3.75 Gm Pack (Colesevelam hcl) .Marland Kitchen.. 1 pack by mouth once daily in 8 oz fluid  Patient Instructions: 1)  You will be contacted about the referral(s) to: Head MRI 2)  Please take all new medications as prescribed 3)  Continue all  previous medications as before this visit 4)  Please return in 4 wks for Lab only:   5)  Hepatic Panel prior to visit, ICD-9: v58.69 6)  Lipid Panel prior to visit, ICD-9: 272.0 7)  You will be contacted about the referral(s) to: colonoscopy 8)  Please schedule a follow-up appointment in 1 year, or sooner if needed Prescriptions: CLARINEX 5 MG  TABS (DESLORATADINE) Take 1 by mouth once daily  #90 x 3   Entered and Authorized by:   Corwin Levins MD   Signed by:   Corwin Levins MD on 01/04/2010   Method used:   Print then Give to Patient   RxID:   [id number] PRAVACHOL 40 MG  TABS (PRAVASTATIN SODIUM) TAKE 1 by mouth once daily  #90 x 3   Entered and Authorized by:   Corwin Levins MD   Signed by:   Corwin Levins MD on 01/04/2010   Method used:   Print then Give to Patient   RxID:   [id number] LANSOPRAZOLE 30 MG CPDR (LANSOPRAZOLE) 1 by mouth once daily  #90 x 3   Entered and Authorized by:   Corwin Levins  MD   Signed by:   Corwin Levins MD on 01/04/2010   Method used:   Print then Give to Patient   RxID:   [id number] DIOVAN HCT 160-12.5 MG  TABS (VALSARTAN-HYDROCHLOROTHIAZIDE) 1 by mouth once daily  #90 x 3   Entered and Authorized by:   Corwin Levins MD   Signed by:   Corwin Levins MD on 01/04/2010   Method used:   Print then Give to Patient   RxID:   [id number] WELCHOL 3.75 GM PACK (COLESEVELAM HCL) 1 pack by mouth once daily in 8 oz fluid  #90 x 3   Entered and Authorized by:   Corwin Levins MD   Signed by:   Corwin Levins MD on 01/04/2010   Method used:   Print then Give to Patient   RxID:   [id number]

## 2010-05-02 NOTE — Letter (Signed)
Summary: Pre Visit Letter Revised  [facility]  [address] [place], [place] [address]   Phone: [phone]  Fax: [phone]        01/16/2010 MRN: [id number] Heather Merritt [address] [place], [place]  [address]             Procedure Date:  02-28-10   Welcome to the Gastroenterology Division at [facility].    You are scheduled to see a nurse for your pre-procedure visit on 02-14-10 at 8:30a.m. on the 3rd floor at [facility], [address]  We ask that you try to arrive at our office 15 minutes prior to your appointment time to allow for check-in.  Please take a minute to review the attached form.  If you answer "Yes" to one or more of the questions on the first page, we ask that you call the person listed at your earliest opportunity.  If you answer "No" to all of the questions, please complete the rest of the form and bring it to your appointment.    Your nurse visit will consist of discussing your medical and surgical history, your immediate family medical history, and your medications.   If you are unable to list all of your medications on the form, please bring the medication bottles to your appointment and we will list them.  We will need to be aware of both prescribed and over the counter drugs.  We will need to know exact dosage information as well.    Please be prepared to read and sign documents such as consent forms, a financial agreement, and acknowledgement forms.  If necessary, and with your consent, a friend or relative is welcome to sit-in on the nurse visit with you.  Please bring your insurance card so that we may make a copy of it.  If your insurance requires a referral to see a specialist, please bring your referral form from your primary care physician.  No co-pay is required for this nurse visit.     If you cannot keep your appointment, please call [phone] to cancel or reschedule prior to your appointment date.  This allows  [place] the opportunity to schedule an appointment for another patient in need of care.    Thank you for choosing [facility] for your medical needs.  We appreciate the opportunity to care for you.  Please visit [place] at our website  to learn more about our practice.  Sincerely, The Gastroenterology Division

## 2010-05-02 NOTE — Progress Notes (Signed)
Summary: PT ?  Phone Note Call from Patient Call back at [facility] Phone [phone]   Caller: Patient Summary of Call: pt called stating that she received saple of Diovan 160/25 but her normal dose is 160/12.5. pt would like to know if this is okay or should she bring them back for 160-12.5? Initial call taken by: Margaret Pyle, CMA,  December 14, 2008 11:10 AM  Follow-up for Phone Call        sorry - pt is correct, I reviewed this and she was given the incorrect samples;  I have 2 more packages of the correct samples for her to return to pick up - to dahlia to arrange Follow-up by: Corwin Levins MD,  December 14, 2008 12:49 PM  Additional Follow-up for Phone Call Additional follow up Details #1::        left message on machine for pt to return my call  samples in front office for pt pick up Additional Follow-up by: Margaret Pyle, CMA,  December 14, 2008 1:06 PM    Additional Follow-up for Phone Call Additional follow up Details #2::    spoke with pt. she will come back to office to return wrong samples for correct ones. Follow-up by: Margaret Pyle, CMA,  December 15, 2008 8:45 AM

## 2010-05-02 NOTE — Progress Notes (Signed)
Summary: BP med  Phone Note Call from Patient Call back at Work Phone [phone]   Caller: Patient Summary of Call: Pt called stating that she may have taken her BP medications twice this morning, she is not sure. Pt states that she feels fine but does not know if there is something she should/could do to counter act any possible symptoms. Please advise.  Follow-up for Phone Call        should be ok this time, but should watch for any dizziness or undue weakness, and avoid dehydration since she took double dose of her mild fluid pill (in with the diovan hct);  it would be nice to check BP at a local drug store or home later in the day before dinner and call if < 100 on the top number;  ok to cont meds as per usual tomorrow Follow-up by: Corwin Levins MD,  October 25, 2009 9:36 AM  Additional Follow-up for Phone Call Additional follow up Details #1::        Pt informed and will call with her BP before 5pm or tomorrow morning if low. Additional Follow-up by: Margaret Pyle, CMA,  October 25, 2009 10:20 AM

## 2010-05-16 ENCOUNTER — Encounter (INDEPENDENT_AMBULATORY_CARE_PROVIDER_SITE_OTHER): Payer: Self-pay | Admitting: *Deleted

## 2010-05-18 ENCOUNTER — Encounter: Payer: Self-pay | Admitting: Internal Medicine

## 2010-05-24 NOTE — Letter (Signed)
Summary: [facility] Instructions  [facility]  [address] [place], [place] [address]   Phone: [phone]  Fax: [phone]       JINAN BIGGINS    Aug 09, 1955    MRN: [id number]        Procedure Day Dorna Bloom:  Farrell Ours 06/02/10     Arrival Time:  8:00am     Procedure Time:  9:00am     Location of Procedure:                    [facility] (4th Floor)                       PREPARATION FOR COLONOSCOPY WITH MOVIPREP   Starting 5 days prior to your procedure  SUNDAY 02/26  do not eat nuts, seeds, popcorn, corn, beans, peas,  salads, or any raw vegetables.  Do not take any fiber supplements (e.g. Metamucil, Citrucel, and Benefiber).  THE DAY BEFORE YOUR PROCEDURE         DATE: THURSDAY 03/01  1.  Drink clear liquids the entire day-NO SOLID FOOD  2.  Do not drink anything colored red or purple.  Avoid juices with pulp.  No orange juice.  3.  Drink at least 64 oz. (8 glasses) of fluid/clear liquids during the day to prevent dehydration and help the prep work efficiently.  CLEAR LIQUIDS INCLUDE: Water Jello Ice Popsicles Tea (sugar ok, no milk/cream) Powdered fruit flavored drinks Coffee (sugar ok, no milk/cream) Gatorade Juice: apple, white grape, white cranberry  Lemonade Clear bullion, consomm, broth Carbonated beverages (any kind) Strained chicken noodle soup Hard Candy                             4.  In the morning, mix first dose of MoviPrep solution:    Empty 1 Pouch A and 1 Pouch B into the disposable container    Add lukewarm drinking water to the top line of the container. Mix to dissolve    Refrigerate (mixed solution should be used within 24 hrs)  5.  Begin drinking the prep at 5:00 p.m. The MoviPrep container is divided by 4 marks.   Every 15 minutes drink the solution down to the next mark (approximately 8 oz) until the full liter is complete.   6.  Follow completed prep with 16 oz of clear liquid of your choice (Nothing  red or purple).  Continue to drink clear liquids until bedtime.  7.  Before going to bed, mix second dose of MoviPrep solution:    Empty 1 Pouch A and 1 Pouch B into the disposable container    Add lukewarm drinking water to the top line of the container. Mix to dissolve    Refrigerate  THE DAY OF YOUR PROCEDURE      DATE:  FRIDAY  03/02  Beginning at  4:00 a.m. (5 hours before procedure):         1. Every 15 minutes, drink the solution down to the next mark (approx 8 oz) until the full liter is complete.  2. Follow completed prep with 16 oz. of clear liquid of your choice.    3. You may drink clear liquids until  7:00am  (2 HOURS BEFORE PROCEDURE).   MEDICATION INSTRUCTIONS  Unless otherwise instructed, you should take regular prescription medications with a small sip of water   as early as possible  the morning of your procedure.   Additional medication instructions: Do not take Diovan/HCTZ day of procedure.         OTHER INSTRUCTIONS  You will need a responsible adult at least 57 years of age to accompany you and drive you home.   This person must remain in the waiting room during your procedure.  Wear loose fitting clothing that is easily removed.  Leave jewelry and other valuables at home.  However, you may wish to bring a book to read or  an iPod/MP3 player to listen to music as you wait for your procedure to start.  Remove all body piercing jewelry and leave at home.  Total time from sign-in until discharge is approximately 2-3 hours.  You should go home directly after your procedure and rest.  You can resume normal activities the  day after your procedure.  The day of your procedure you should not:   Drive   Make legal decisions   Operate machinery   Drink alcohol   Return to work  You will receive specific instructions about eating, activities and medications before you leave.    The above instructions have been reviewed and explained to me by    Ezra Sites RN  May 18, 2010 8:22 AM    I fully understand and can verbalize these instructions _____________________________ Date _________

## 2010-05-24 NOTE — Miscellaneous (Signed)
Summary: [facility]  Clinical Lists Changes  Medications: Added new medication of MOVIPREP 100 GM  SOLR (PEG-KCL-NACL-NASULF-NA ASC-C) As per prep instructions. - Signed Rx of MOVIPREP 100 GM  SOLR (PEG-KCL-NACL-NASULF-NA ASC-C) As per prep instructions.;  #1 x 0;  Signed;  Entered by: Ezra Sites RN;  Authorized by: Hilarie Fredrickson MD;  Method used: [facility] [phone]*, [address] [place], [place]  [address], Ph: [phone] or [phone], Fax: [phone] Allergies: Changed allergy or adverse reaction from SULFA to SULFA Changed allergy or adverse reaction from PENICILLIN to PENICILLIN Changed allergy or adverse reaction from LIPITOR to LIPITOR Changed allergy or adverse reaction from ZOCOR to ZOCOR Changed allergy or adverse reaction from CRESTOR to CRESTOR    Prescriptions: MOVIPREP 100 GM  SOLR (PEG-KCL-NACL-NASULF-NA ASC-C) As per prep instructions.  #1 x 0   Entered by:   Ezra Sites RN   Authorized by:   Hilarie Fredrickson MD   Signed by:   Ezra Sites RN on 05/18/2010   Method used:   [facility]ronically to        [facility] [phone]* (retail)       [address]       [place], [place]  [address]       Ph: [phone] or [phone]       Fax: [phone]   RxID:   [id number]

## 2010-05-29 ENCOUNTER — Telehealth (INDEPENDENT_AMBULATORY_CARE_PROVIDER_SITE_OTHER): Payer: Self-pay | Admitting: *Deleted

## 2010-06-02 ENCOUNTER — Other Ambulatory Visit: Payer: Self-pay | Admitting: Internal Medicine

## 2010-06-02 ENCOUNTER — Other Ambulatory Visit (AMBULATORY_SURGERY_CENTER): Payer: Managed Care, Other (non HMO) | Admitting: Internal Medicine

## 2010-06-02 DIAGNOSIS — D126 Benign neoplasm of colon, unspecified: Secondary | ICD-10-CM

## 2010-06-02 DIAGNOSIS — K5730 Diverticulosis of large intestine without perforation or abscess without bleeding: Secondary | ICD-10-CM

## 2010-06-02 DIAGNOSIS — K648 Other hemorrhoids: Secondary | ICD-10-CM

## 2010-06-02 DIAGNOSIS — Z1211 Encounter for screening for malignant neoplasm of colon: Secondary | ICD-10-CM

## 2010-06-06 ENCOUNTER — Encounter: Payer: Self-pay | Admitting: Internal Medicine

## 2010-06-08 NOTE — Progress Notes (Signed)
Summary: Prep questions  Phone Note Call from Patient Call back at Work Phone [phone]   Caller: Patient Call For: Dr. Marina Goodell Reason for Call: Talk to Nurse Summary of Call: Pt has questions about the ingredients in the Movi prep and would like to speak with a nurse Initial call taken by: [place] Johnson,  May 29, 2010 8:16 AM  Follow-up for Phone Call        Talked w/ pt about concerns w/ MoviPrep.  She will call pharmacist today and talk about concerns about MoviPrep.  She will call us back if she has any further concerns about prep .Ezra Sites RN  May 29, 2010 8:37 AM      Appended Document: Prep questions Dr. Marina Goodell- pt talked w/ pharmacist about MoviPrep.  She has an allergy to sulfites and sulfa.  (She feels faint, has nausea and vomiting, and severe esophageal pain.)  The pharmacist said that using moviprep should be okay but he could not guarantee that she would not have a problem w/ prep.  Pt is still concerned about using moviprep.  Please advise.  Appended Document: Prep questions  most people have sulfa allergy and confuse  sulfa with sulfites.   They are different. I suspect this is the case with her and proceeding with her move the prep will  be fine  Appended Document: Prep questions Talked w/ pt.  She will proceed w/ Moviprep as planned.  Instructed pt to call us if she has any problems w/ prep.

## 2010-06-08 NOTE — Procedures (Addendum)
Summary: Colonoscopy  Patient: Heather Merritt Note: All result statuses are Final unless otherwise noted.  Tests: (1) Colonoscopy (COL)   COL Colonoscopy           DONE     [facility]     [address]     [place], [place]  [address]          COLONOSCOPY PROCEDURE REPORT          PATIENT:  [name], [name]  MR#:  [id number]     BIRTHDATE:  [birth year], 54 yrs. old  GENDER:  female     ENDOSCOPIST:  Wilhemina Bonito. Eda Keys, MD     REF. BY:  Screening Recall     PROCEDURE DATE:  06/02/2010     PROCEDURE:  Colonoscopy with snare polypectomy x 1     ASA CLASS:  Class II     INDICATIONS:  Routine Risk Screening     MEDICATIONS:   Fentanyl 50 mcg IV, Versed 5 mg IV          DESCRIPTION OF PROCEDURE:   After the risks benefits and     alternatives of the procedure were thoroughly explained, informed     consent was obtained.  Digital rectal exam was performed and     revealed no abnormalities.   The LB CF-H180AL [id number] endoscope     was introduced through the anus and advanced to the cecum, which     was identified by both the appendix and ileocecal valve, without     limitations.Time to cecum = 2:32 min.  The quality of the prep was     excellent, using MoviPrep.  The instrument was then slowly     withdrawn (time = 15:30 min) as the colon was fully examined.     <<PROCEDUREIMAGES>>          FINDINGS:  A diminutive polyp was found in the ascending colon.     Polyp was snared without cautery. Retrieval was successful.     Moderate diverticulosis was found in the sigmoid colon.  Otherwise     normal colonoscopy without other polyps, masses, vascular     ectasias, or inflammatory changes.   Retroflexed views in the     rectum revealed internal hemorrhoids.    The scope was then     withdrawn from the patient and the procedure completed.          COMPLICATIONS:  None          ENDOSCOPIC IMPRESSION:     1) Diminutive polyp in the ascending colon - removed     2) Moderate  diverticulosis in the sigmoid colon     3) Otherwise normal colonoscopy     4) Internal hemorrhoids          RECOMMENDATIONS:     1) Repeat colonoscopy in 5 years if polyp adenomatous; otherwise     10 years          ______________________________     Wilhemina Bonito. Eda Keys, MD          CC:  Corwin Levins, MD; The Patient          n.     eSIGNED:   Wilhemina Bonito. Eda Keys at 06/02/2010 10:10 AM          Erich Montane, [id number]  Note: An exclamation mark (!) indicates a result that was not dispersed into the flowsheet. Document Creation Date: 06/02/2010 10:10 AM _______________________________________________________________________  (  1) Order result status: Final Collection or observation date-time: 06/02/2010 10:03 Requested date-time:  Receipt date-time:  Reported date-time:  Referring Physician:   Ordering Physician: Fransico Setters [phone]) Specimen Source:  Source: Launa Grill Order Number: [id number] Lab site:   Appended Document: Colonoscopy recall     Procedures Next Due Date:    Colonoscopy: 06/2015

## 2010-06-13 NOTE — Letter (Addendum)
Summary: Patient Notice- Polyp Results  [facility]  [address] [place], [place] [address]   Phone: [phone]  Fax: [phone]        June 06, 2010 MRN: [id number]    Heather Merritt [address] [place], [place]  [address]    Dear [name],  I am pleased to inform you that the colon polyp(s) removed during your recent colonoscopy was (were) found to be benign (no cancer detected) upon pathologic examination.  I recommend you have a repeat colonoscopy examination in 5 years to look for recurrent polyps, as having colon polyps increases your risk for having recurrent polyps or even colon cancer in the future.  Should you develop new or worsening symptoms of abdominal pain, bowel habit changes or bleeding from the rectum or bowels, please schedule an evaluation with either your primary care physician or with me.  Additional information/recommendations:  __ No further action with gastroenterology is needed at this time. Please      follow-up with your primary care physician for your other healthcare      needs.   Please call us if you are having persistent problems or have questions about your condition that have not been fully answered at this time.  Sincerely,  Hilarie Fredrickson MD  This letter has been electronically signed by your physician.  Appended Document: Patient Notice- Polyp Results letter mailed

## 2010-08-15 NOTE — Assessment & Plan Note (Signed)
[facility]                         GASTROENTEROLOGY OFFICE NOTE   MACKINZIE, VUNCANNON                     MRN:          [id number]  DATE:12/04/2006                            DOB:          01-17-1956    REFERRING PHYSICIAN:  Corwin Levins, MD   REASON FOR CONSULTATION:  1. Abdominal pain.  2. Dysphagia.   HISTORY OF PRESENT ILLNESS:  This is a 55 year old white female with  history of gastroesophageal reflux disease, hypertension,  hyperlipidemia, and low back pain.  She is referred through the courtesy  of Dr. Jonny Ruiz regarding transient abdominal pain and dysphagia.  The  patient was evaluated in this clinic in January 2001 for reflux disease,  dysphagia, and abdominal complaints.  She was felt to have reflux  disease, possible peptic stricture, and irritable bowel.  She  subsequently underwent colonoscopy and upper endoscopy April 17, 1999.  Complete colonoscopy including intubation of the terminal ileum was  normal except for sigmoid diverticulosis and internal hemorrhoids.  Upper endoscopy revealed mild esophagitis and a sliding hiatal hernia.  No obvious stricture.  It was elected to keep her on Prevacid with  planned followup in the office in six weeks.  At the time of followup  the patient was asymptomatic.  Currently she reports that her chronic  indigestion and heartburn are well-controlled with Prevacid.  Off  medication she has significant breakthrough symptoms.  She complains of  dysphagia to large pills only.  She has had this for some time, and  denies that it has gotten significantly worse.  No problems with solid  food or liquids.  She has had no weight loss.  She reports an episode of  abdominal pain after defecation in mid July.  She states that the bowel  movement was normal.  There was no associated constipation.  She does  have chronic bloating and gas.  The discomfort was the lower part of the  abdomen and lasted about two  days.  There was no associated nausea,  vomiting, fevers, melena, hematochezia or urinary symptoms.  She denies  having had problems previous or since.  Blood work obtained in late June  of 2008 found that her hemoglobin, comprehensive metabolic panel, and  urinalysis were all normal.   PAST MEDICAL HISTORY:  1. Hypertension.  2. Dyslipidemia.  3. Low back pain.  4. Gastroesophageal reflux disease.   PAST SURGICAL HISTORY:  Tonsillectomy.   ALLERGIES:  The patient is allergic or intolerant to PENICILLIN, SULFA,  LIPITOR, ZOCOR, CRESTOR.   CURRENT MEDICATIONS:  1. Prevacid 30 mg daily.  2. Diovan/HCT 160/12.5 mg daily.  3. Pravachol 80 mg daily.  4. Oracea 40 mg daily.   FAMILY HISTORY:  No family history of gastrointestinal malignancy.   SOCIAL HISTORY:  The patient is separated without children.  She lives  alone.  She works in Arts administrator for [employer].  She does not smoke or  use alcohol.   REVIEW OF SYSTEMS:  Per diagnostic evaluation form.   PHYSICAL EXAMINATION:  GENERAL:  A well-appearing female in no acute  distress.  VITAL SIGNS:  Blood pressure  116/68, heart rate 86, weight 173.8 pounds,  5 feet 3-3/4 inches.  HEENT:  Sclerae anicteric, conjunctivae are pink, oral mucosa is intact.  NECK:  There is no adenopathy.  LUNGS:  Clear to auscultation and percussion.  HEART:  Regular without murmur.  ABDOMEN:  Obese and soft without tenderness, mass or hernia.  Good bowel  sounds heard.  EXTREMITIES:  Without edema.   IMPRESSION:  1. Gastroesophageal reflux disease.  Symptoms controlled on Prevacid.  2. Minor intermittent dysphagia to large pills.  3. Problems with minor dysphagia chronic and stable. Endoscopy in 2001      as described.  4. Transient abdominal pain.  Cause uncertain.  No worrisome features      by history. Currently asymptomatic.   RECOMMENDATIONS:  1. Continue Prevacid.  2. Offered upper endoscopy with possible esophageal dilation.  She       declined and wishes to see if things worsen.  3. Abdominal ultrasound to evaluate transient pain.  4. Screening Hemoccult cards.  5. Ongoing general medical care with Dr. Jonny Ruiz.  GI followup p.r.n.      She would be due for routine screening colonoscopy in a couple of      years unless otherwise clinically indicated.     Wilhemina Bonito. Marina Goodell, MD  Electronically Signed    JNP/MedQ  DD: 12/04/2006  DT: 12/04/2006  Job #: [id number]   cc:   Corwin Levins, MD

## 2011-01-05 ENCOUNTER — Telehealth: Payer: Self-pay

## 2011-01-05 DIAGNOSIS — Z0000 Encounter for general adult medical examination without abnormal findings: Secondary | ICD-10-CM

## 2011-01-05 NOTE — Telephone Encounter (Signed)
Put order in for labs. 

## 2011-01-09 ENCOUNTER — Other Ambulatory Visit: Payer: Self-pay | Admitting: Obstetrics

## 2011-01-09 DIAGNOSIS — Z1231 Encounter for screening mammogram for malignant neoplasm of breast: Secondary | ICD-10-CM

## 2011-01-30 ENCOUNTER — Ambulatory Visit
Admission: RE | Admit: 2011-01-30 | Discharge: 2011-01-30 | Disposition: A | Discharge status: Home / Self Care | Payer: Managed Care, Other (non HMO) | Source: Ambulatory Visit | Attending: Obstetrics | Admitting: Obstetrics

## 2011-01-30 DIAGNOSIS — Z1231 Encounter for screening mammogram for malignant neoplasm of breast: Secondary | ICD-10-CM

## 2011-02-06 ENCOUNTER — Encounter: Payer: Self-pay | Admitting: Internal Medicine

## 2011-02-15 ENCOUNTER — Other Ambulatory Visit (INDEPENDENT_AMBULATORY_CARE_PROVIDER_SITE_OTHER): Payer: Managed Care, Other (non HMO)

## 2011-02-15 ENCOUNTER — Other Ambulatory Visit: Payer: Self-pay | Admitting: Internal Medicine

## 2011-02-15 DIAGNOSIS — Z0000 Encounter for general adult medical examination without abnormal findings: Secondary | ICD-10-CM

## 2011-02-15 LAB — BASIC METABOLIC PANEL
BUN: 19 mg/dL (ref 6–23)
CO2: 30 mEq/L (ref 19–32)
Calcium: 9.6 mg/dL (ref 8.4–10.5)
Chloride: 100 mEq/L (ref 96–112)
Creatinine, Ser: 0.6 mg/dL (ref 0.4–1.2)
GFR: 109.25 mL/min (ref >60.00)
Glucose, Bld: 83 mg/dL (ref 70–99)
Potassium: 4.1 mEq/L (ref 3.5–5.1)
Sodium: 139 mEq/L (ref 135–145)

## 2011-02-15 LAB — LIPID PANEL
Cholesterol: 241 mg/dL — ABNORMAL HIGH (ref 0–200)
HDL: 42.7 mg/dL (ref >39.00)
Total CHOL/HDL Ratio: 6
Triglycerides: 346 mg/dL — ABNORMAL HIGH (ref 0.0–149.0)
VLDL: 69.2 mg/dL — ABNORMAL HIGH (ref 0.0–40.0)

## 2011-02-15 LAB — CBC WITH DIFFERENTIAL/PLATELET
Basophils Absolute: 0 10*3/uL (ref 0.0–0.1)
Basophils Relative: 0.4 % (ref 0.0–3.0)
Eosinophils Absolute: 0.1 10*3/uL (ref 0.0–0.7)
Eosinophils Relative: 1.7 % (ref 0.0–5.0)
HCT: 40.3 % (ref 36.0–46.0)
Hemoglobin: 13.8 g/dL (ref 12.0–15.0)
Lymphocytes Relative: 30.1 % (ref 12.0–46.0)
Lymphs Abs: 1.2 10*3/uL (ref 0.7–4.0)
MCHC: 34.2 g/dL (ref 30.0–36.0)
MCV: 90.9 fl (ref 78.0–100.0)
Monocytes Absolute: 0.3 10*3/uL (ref 0.1–1.0)
Monocytes Relative: 8.5 % (ref 3.0–12.0)
Neutro Abs: 2.3 10*3/uL (ref 1.4–7.7)
Neutrophils Relative %: 59.3 % (ref 43.0–77.0)
Platelets: 248 10*3/uL (ref 150.0–400.0)
RBC: 4.44 Mil/uL (ref 3.87–5.11)
RDW: 12.8 % (ref 11.5–14.6)
WBC: 3.8 10*3/uL — ABNORMAL LOW (ref 4.5–10.5)

## 2011-02-15 LAB — URINALYSIS, ROUTINE W REFLEX MICROSCOPIC
Bilirubin Urine: NEGATIVE
Hgb urine dipstick: NEGATIVE
Ketones, ur: NEGATIVE
Leukocytes, UA: NEGATIVE
Nitrite: NEGATIVE
Specific Gravity, Urine: 1.02 (ref 1.000–1.030)
Total Protein, Urine: NEGATIVE
Urine Glucose: NEGATIVE
Urobilinogen, UA: 0.2 (ref 0.0–1.0)
pH: 6 (ref 5.0–8.0)

## 2011-02-15 LAB — HEPATIC FUNCTION PANEL
ALT: 63 U/L — ABNORMAL HIGH (ref 0–35)
AST: 40 U/L — ABNORMAL HIGH (ref 0–37)
Albumin: 4 g/dL (ref 3.5–5.2)
Alkaline Phosphatase: 49 U/L (ref 39–117)
Bilirubin, Direct: 0.1 mg/dL (ref 0.0–0.3)
Total Bilirubin: 0.8 mg/dL (ref 0.3–1.2)
Total Protein: 7.1 g/dL (ref 6.0–8.3)

## 2011-02-15 LAB — LDL CHOLESTEROL, DIRECT: Direct LDL: 153.9 mg/dL

## 2011-02-16 ENCOUNTER — Other Ambulatory Visit: Payer: Self-pay

## 2011-02-16 LAB — TSH: TSH: 2.14 u[IU]/mL (ref 0.35–5.50)

## 2011-02-16 MED ORDER — VALSARTAN-HYDROCHLOROTHIAZIDE 160-12.5 MG PO TABS: 1.0000 | ORAL_TABLET | Freq: Every day | ORAL | Status: DC

## 2011-02-16 NOTE — Telephone Encounter (Signed)
Patient called to request a refill #30 day to hold until appointment on Nov. 27. Called and informed the patient we sent in as requested [facility] [address] [place].

## 2011-02-24 ENCOUNTER — Encounter: Payer: Self-pay | Admitting: Internal Medicine

## 2011-02-24 DIAGNOSIS — Z0001 Encounter for general adult medical examination with abnormal findings: Secondary | ICD-10-CM | POA: Insufficient documentation

## 2011-02-27 ENCOUNTER — Encounter: Payer: Self-pay | Admitting: Internal Medicine

## 2011-02-27 ENCOUNTER — Ambulatory Visit (INDEPENDENT_AMBULATORY_CARE_PROVIDER_SITE_OTHER): Payer: Managed Care, Other (non HMO) | Admitting: Internal Medicine

## 2011-02-27 VITALS — BP 132/80 | HR 74 | Temp 98.4°F | Ht 64.0 in | Wt 188.2 lb

## 2011-02-27 DIAGNOSIS — M810 Age-related osteoporosis without current pathological fracture: Secondary | ICD-10-CM | POA: Insufficient documentation

## 2011-02-27 DIAGNOSIS — E785 Hyperlipidemia, unspecified: Secondary | ICD-10-CM

## 2011-02-27 DIAGNOSIS — M549 Dorsalgia, unspecified: Secondary | ICD-10-CM | POA: Insufficient documentation

## 2011-02-27 DIAGNOSIS — K219 Gastro-esophageal reflux disease without esophagitis: Secondary | ICD-10-CM

## 2011-02-27 DIAGNOSIS — Z0000 Encounter for general adult medical examination without abnormal findings: Secondary | ICD-10-CM

## 2011-02-27 DIAGNOSIS — M519 Unspecified thoracic, thoracolumbar and lumbosacral intervertebral disc disorder: Secondary | ICD-10-CM | POA: Insufficient documentation

## 2011-02-27 MED ORDER — DESLORATADINE 5 MG PO TABS: 5.0000 mg | ORAL_TABLET | Freq: Every day | ORAL | Status: DC

## 2011-02-27 MED ORDER — COLESEVELAM HCL 3.75 G PO PACK: 1.0000 | PACK | Freq: Every day | ORAL | Status: DC

## 2011-02-27 MED ORDER — COLESEVELAM HCL 3.75 G PO PACK: 1.0000 | PACK | Freq: Two times a day (BID) | ORAL | Status: DC

## 2011-02-27 MED ORDER — LANSOPRAZOLE 15 MG PO CPDR: 15.0000 mg | DELAYED_RELEASE_CAPSULE | Freq: Every day | ORAL | Status: DC

## 2011-02-27 MED ORDER — VALSARTAN-HYDROCHLOROTHIAZIDE 160-12.5 MG PO TABS: 1.0000 | ORAL_TABLET | Freq: Every day | ORAL | Status: DC

## 2011-02-27 NOTE — Assessment & Plan Note (Signed)

## 2011-02-27 NOTE — Patient Instructions (Signed)
Continue all other medications as before, except stop the pravachol and the prevacid is decreased to 15 mg Please keep your appointments with your specialists as you have planned - GYN, mammogram You are otherwise up to date with prevention You will be contacted regarding the referral for: Dr June Leap are given the hardcopies of your prescriptions as requested Please return in 1 year for your yearly visit, or sooner if needed, with Lab testing done 3-5 days before

## 2011-02-27 NOTE — Progress Notes (Signed)
Subjective:    Patient ID: Heather Merritt, female    DOB: 09-12-1955, 55 y.o.   MRN: [id number]  HPI Here for wellness and f/u;  Overall doing ok;  Pt denies CP, worsening SOB, DOE, wheezing, orthopnea, PND, worsening LE edema, palpitations, dizziness or syncope.  Pt denies neurological change such as new Headache, facial or extremity weakness.  Pt denies polydipsia, polyuria, or low sugar symptoms. Pt states overall good compliance with treatment and medications, good tolerability, and trying to follow lower cholesterol diet.  Pt denies worsening depressive symptoms, suicidal ideation or panic. No fever, wt loss, night sweats, loss of appetite, or other constitutional symptoms.  Pt states good ability with ADL's, low fall risk, home safety reviewed and adequate, no significant changes in hearing or vision, and occasionally active with exercise.  Pt continues to have recurring LBP without change in severity, bowel or bladder change, fever, wt loss,  worsening LE pain/numbness/weakness, gait change or falls, and plans to f/u with Dr Wynetta Emery.  Also has been taking 15 mg prevacid OTC and doing well, asks for decrease dosing as she does well with this.  Also willing to re-try welchol with stool softner for constipation as her chol is still quite severe and statin intolerant. Past Medical History  Diagnosis Date  . ANXIETY 10/28/2007  . DIVERTICULOSIS, COLON 10/28/2007  . GERD 10/28/2007  . HYPERLIPIDEMIA 10/28/2007  . HYPERTENSION 10/28/2007  . LIVER FUNCTION TESTS, ABNORMAL, HX OF 10/29/2007  . OSTEOPENIA 10/29/2007  . Presbyacusis 01/04/2010  . Irritable bowel syndrome (IBS)   . Migraine   . Obesity   . Vitamin D deficiency   . Lumbar disc disease    Past Surgical History  Procedure Date  . Tonsillectomy     reports that she has quit smoking. She does not have any smokeless tobacco history on file. She reports that she drinks alcohol. She reports that she does not use illicit drugs. family history  includes Atrial fibrillation in her father and mother; Hyperlipidemia in her mother; Hypothyroidism in her maternal grandmother and sister; and Stroke in her mother. Allergies  Allergen Reactions  . Atorvastatin     REACTION: muscle weakness  . Penicillins     REACTION: rash  . Rosuvastatin     REACTION: muscle weakness  . Simvastatin     REACTION: muscle weakness  . Sulfonamide Derivatives     REACTION: nausea, abd pain   Current Outpatient Prescriptions on File Prior to Visit  Medication Sig Dispense Refill  . aspirin 81 MG tablet Take 81 mg by mouth daily.         Review of Systems Review of Systems  Constitutional: Negative for diaphoresis, activity change, appetite change and unexpected weight change.  HENT: Negative for hearing loss, ear pain, facial swelling, mouth sores and neck stiffness.   Eyes: Negative for pain, redness and visual disturbance.  Respiratory: Negative for shortness of breath and wheezing.   Cardiovascular: Negative for chest pain and palpitations.  Gastrointestinal: Negative for diarrhea, blood in stool, abdominal distention and rectal pain.  Genitourinary: Negative for hematuria, flank pain and decreased urine volume.  Musculoskeletal: Negative for myalgias and joint swelling.  Skin: Negative for color change and wound.  Neurological: Negative for syncope and numbness.  Hematological: Negative for adenopathy.  Psychiatric/Behavioral: Negative for hallucinations, self-injury, decreased concentration and agitation.      Objective:   Physical Exam BP 132/80  Pulse 74  Temp(Src) 98.4 F (36.9 C) (Oral)  Ht 5\' 4"  (1.626  m)  Wt 188 lb 4 oz (85.39 kg)  BMI 32.31 kg/m2  SpO2 94% Physical Exam  VS noted Constitutional: Pt is oriented to person, place, and time. Appears well-developed and well-nourished.  HENT:  Head: Normocephalic and atraumatic.  Right Ear: External ear normal.  Left Ear: External ear normal.  Nose: Nose normal.  Mouth/Throat:  Oropharynx is clear and moist.  Eyes: Conjunctivae and EOM are normal. Pupils are equal, round, and reactive to light.  Neck: Normal range of motion. Neck supple. No JVD present. No tracheal deviation present.  Cardiovascular: Normal rate, regular rhythm, normal heart sounds and intact distal pulses.   Pulmonary/Chest: Effort normal and breath sounds normal.  Abdominal: Soft. Bowel sounds are normal. There is no tenderness.  Musculoskeletal: Normal range of motion. Exhibits no edema.  Lymphadenopathy:  Has no cervical adenopathy.  Neurological: Pt is alert and oriented to person, place, and time. Pt has normal reflexes. No cranial nerve deficit. Motor/gait normal Skin: Skin is warm and dry. No rash noted.  Psychiatric:  Has  normal mood and affect. Behavior is normal.     Assessment & Plan:

## 2011-03-04 ENCOUNTER — Encounter: Payer: Self-pay | Admitting: Internal Medicine

## 2011-03-04 NOTE — Assessment & Plan Note (Signed)
For f/u with Dr Wynetta Emery, wil refer as has ongoing pain, better now with standing, has to use pillow at night beween legs, per pt request

## 2011-03-04 NOTE — Assessment & Plan Note (Signed)
stable overall by hx and exam,  and pt to continue medical treatment as before except der to 15 mg per day

## 2011-03-04 NOTE — Assessment & Plan Note (Signed)
ot re-start the welchol, f/iu labs next visit

## 2011-04-12 ENCOUNTER — Other Ambulatory Visit: Payer: Self-pay | Admitting: Neurosurgery

## 2011-04-12 DIAGNOSIS — M542 Cervicalgia: Secondary | ICD-10-CM

## 2011-04-17 ENCOUNTER — Other Ambulatory Visit: Payer: Managed Care, Other (non HMO)

## 2011-04-17 ENCOUNTER — Ambulatory Visit
Admission: RE | Admit: 2011-04-17 | Discharge: 2011-04-17 | Disposition: A | Discharge status: Home / Self Care | Payer: Managed Care, Other (non HMO) | Source: Ambulatory Visit | Attending: Neurosurgery | Admitting: Neurosurgery

## 2011-04-17 DIAGNOSIS — M542 Cervicalgia: Secondary | ICD-10-CM

## 2011-04-18 ENCOUNTER — Ambulatory Visit
Admission: RE | Admit: 2011-04-18 | Discharge: 2011-04-18 | Disposition: A | Discharge status: Home / Self Care | Payer: Managed Care, Other (non HMO) | Source: Ambulatory Visit | Attending: Neurosurgery | Admitting: Neurosurgery

## 2011-07-18 ENCOUNTER — Other Ambulatory Visit (HOSPITAL_COMMUNITY): Payer: Managed Care, Other (non HMO)

## 2011-07-18 ENCOUNTER — Encounter (HOSPITAL_COMMUNITY): Payer: Self-pay | Admitting: Pharmacy Technician

## 2011-07-24 ENCOUNTER — Other Ambulatory Visit: Payer: Self-pay | Admitting: Neurosurgery

## 2011-07-25 ENCOUNTER — Encounter (HOSPITAL_COMMUNITY)
Admission: RE | Admit: 2011-07-25 | Discharge: 2011-07-25 | Disposition: A | Discharge status: Home / Self Care | Payer: Managed Care, Other (non HMO) | Source: Ambulatory Visit | Attending: Anesthesiology | Admitting: Anesthesiology

## 2011-07-25 ENCOUNTER — Encounter (HOSPITAL_COMMUNITY): Payer: Self-pay

## 2011-07-25 ENCOUNTER — Encounter (HOSPITAL_COMMUNITY)
Admission: RE | Admit: 2011-07-25 | Discharge: 2011-07-25 | Disposition: A | Discharge status: Home / Self Care | Payer: Managed Care, Other (non HMO) | Source: Ambulatory Visit | Attending: Neurosurgery | Admitting: Neurosurgery

## 2011-07-25 HISTORY — DX: Unspecified osteoarthritis, unspecified site: M19.90

## 2011-07-25 HISTORY — DX: Xerosis cutis: L85.3

## 2011-07-25 HISTORY — DX: Dorsalgia, unspecified: M54.9

## 2011-07-25 HISTORY — DX: Personal history of colon polyps, unspecified: Z86.0100

## 2011-07-25 HISTORY — DX: Personal history of other diseases of the digestive system: Z87.19

## 2011-07-25 HISTORY — DX: Other hemorrhoids: K64.8

## 2011-07-25 HISTORY — DX: Effusion, unspecified joint: M25.40

## 2011-07-25 HISTORY — DX: Pain in unspecified joint: M25.50

## 2011-07-25 HISTORY — DX: Rosacea, unspecified: L71.9

## 2011-07-25 HISTORY — DX: Other seasonal allergic rhinitis: J30.2

## 2011-07-25 HISTORY — DX: Other chronic pain: G89.29

## 2011-07-25 HISTORY — DX: Dizziness and giddiness: R42

## 2011-07-25 HISTORY — DX: Unspecified cataract: H26.9

## 2011-07-25 HISTORY — DX: Personal history of colonic polyps: Z86.010

## 2011-07-25 LAB — CBC
HCT: 41 % (ref 36.0–46.0)
Hemoglobin: 14.3 g/dL (ref 12.0–15.0)
MCH: 30.7 pg (ref 26.0–34.0)
MCHC: 34.9 g/dL (ref 30.0–36.0)
MCV: 88 fL (ref 78.0–100.0)
Platelets: 252 10*3/uL (ref 150–400)
RBC: 4.66 MIL/uL (ref 3.87–5.11)
RDW: 12.4 % (ref 11.5–15.5)
WBC: 4.5 10*3/uL (ref 4.0–10.5)

## 2011-07-25 LAB — BASIC METABOLIC PANEL
BUN: 21 mg/dL (ref 6–23)
CO2: 30 mEq/L (ref 19–32)
Calcium: 9.5 mg/dL (ref 8.4–10.5)
Chloride: 98 mEq/L (ref 96–112)
Creatinine, Ser: 0.64 mg/dL (ref 0.50–1.10)
GFR calc Af Amer: >90 mL/min (ref >90)
GFR calc non Af Amer: >90 mL/min (ref >90)
Glucose, Bld: 103 mg/dL — ABNORMAL HIGH (ref 70–99)
Potassium: 4 mEq/L (ref 3.5–5.1)
Sodium: 139 mEq/L (ref 135–145)

## 2011-07-25 LAB — SURGICAL PCR SCREEN
MRSA, PCR: NEGATIVE
Staphylococcus aureus: NEGATIVE

## 2011-07-25 NOTE — Pre-Procedure Instructions (Signed)
[address]  07/25/2011   Your procedure is scheduled on: Thurs, May 2 @ 0730 AM  Report to [facility] at 0530 AM.  Call this number if you have problems the morning of surgery: [phone]   Remember:   Do not eat food:4 Hours before arrival.  May have clear liquids: up to 4 Hours before arrival.(until 1:30 am)  Clear liquids include soda, tea, black coffee, apple or grape juice, broth,water  Take these medicines the morning of surgery with A SIP OF WATER: Prevacid and Clarinex   Do not wear jewelry, make-up or nail polish.  Do not wear lotions, powders, or perfumes.   Do not bring valuables to the hospital.  Contacts, dentures or bridgework may not be worn into surgery.  Leave suitcase in the car. After surgery it may be brought to your room.  For patients admitted to the hospital, checkout time is 11:00 AM the day of discharge.   Patients discharged the day of surgery will not be allowed to drive home.            Special Instructions: CHG Shower Use Special Wash: 1/2 bottle night before surgery and 1/2 bottle morning of surgery.   Please read over the following fact sheets that you were given: Pain Booklet, Coughing and Deep Breathing, MRSA Information and Surgical Site Infection Prevention

## 2011-07-25 NOTE — Progress Notes (Signed)
Pt doesn't have a cardiologist  Echo done in 89/90 Denies ever having a heart cath or stress test  Medical Md is Dr.James Jonny Ruiz with [facility]

## 2011-08-02 ENCOUNTER — Ambulatory Visit (HOSPITAL_COMMUNITY): Payer: Managed Care, Other (non HMO)

## 2011-08-02 ENCOUNTER — Encounter (HOSPITAL_COMMUNITY): Payer: Self-pay | Admitting: Certified Registered Nurse Anesthetist

## 2011-08-02 ENCOUNTER — Ambulatory Visit (HOSPITAL_COMMUNITY): Payer: Managed Care, Other (non HMO) | Admitting: Certified Registered Nurse Anesthetist

## 2011-08-02 ENCOUNTER — Ambulatory Visit (HOSPITAL_COMMUNITY)
Admission: RE | Admit: 2011-08-02 | Discharge: 2011-08-03 | Disposition: A | Discharge status: Home / Self Care | Payer: Managed Care, Other (non HMO) | Source: Ambulatory Visit | Attending: Neurosurgery | Admitting: Neurosurgery

## 2011-08-02 ENCOUNTER — Encounter (HOSPITAL_COMMUNITY)
Admission: RE | Disposition: A | Discharge status: Home / Self Care | Payer: Self-pay | Source: Ambulatory Visit | Attending: Neurosurgery

## 2011-08-02 DIAGNOSIS — M4712 Other spondylosis with myelopathy, cervical region: Secondary | ICD-10-CM | POA: Insufficient documentation

## 2011-08-02 DIAGNOSIS — F411 Generalized anxiety disorder: Secondary | ICD-10-CM | POA: Insufficient documentation

## 2011-08-02 DIAGNOSIS — F430 Acute stress reaction: Secondary | ICD-10-CM | POA: Insufficient documentation

## 2011-08-02 DIAGNOSIS — Z01812 Encounter for preprocedural laboratory examination: Secondary | ICD-10-CM | POA: Insufficient documentation

## 2011-08-02 DIAGNOSIS — Z01818 Encounter for other preprocedural examination: Secondary | ICD-10-CM | POA: Insufficient documentation

## 2011-08-02 DIAGNOSIS — I10 Essential (primary) hypertension: Secondary | ICD-10-CM | POA: Insufficient documentation

## 2011-08-02 HISTORY — PX: ANTERIOR CERVICAL DECOMP/DISCECTOMY FUSION: SHX1161

## 2011-08-02 SURGERY — ANTERIOR CERVICAL DECOMPRESSION/DISCECTOMY FUSION 3 LEVELS
Anesthesia: General | Wound class: Clean

## 2011-08-02 MED ORDER — MIDAZOLAM HCL 5 MG/5ML IJ SOLN
INTRAMUSCULAR | Status: DC | PRN
  Administered 2011-08-02: 2 mg via INTRAVENOUS

## 2011-08-02 MED ORDER — SODIUM CHLORIDE 0.9 % IV SOLN
INTRAVENOUS | Status: AC
  Filled 2011-08-02: qty 500

## 2011-08-02 MED ORDER — VANCOMYCIN HCL 1000 MG IV SOLR
1000.0000 mg | INTRAVENOUS | Status: DC | PRN
  Administered 2011-08-02: 08:00:00 via INTRAVENOUS

## 2011-08-02 MED ORDER — VITAMIN E 180 MG (400 UNIT) PO CAPS
400.0000 [IU] | ORAL_CAPSULE | Freq: Every day | ORAL | Status: DC
  Administered 2011-08-03: 400 [IU] via ORAL
  Filled 2011-08-02 (×2): qty 1

## 2011-08-02 MED ORDER — ACETAMINOPHEN 325 MG PO TABS: 650.0000 mg | ORAL_TABLET | ORAL | Status: DC | PRN

## 2011-08-02 MED ORDER — THROMBIN 20000 UNITS EX KIT
PACK | CUTANEOUS | Status: DC | PRN
  Administered 2011-08-02: 20000 [IU] via TOPICAL

## 2011-08-02 MED ORDER — 0.9 % SODIUM CHLORIDE (POUR BTL) OPTIME
TOPICAL | Status: DC | PRN
  Administered 2011-08-02: 1000 mL

## 2011-08-02 MED ORDER — DEXAMETHASONE SODIUM PHOSPHATE 4 MG/ML IJ SOLN
INTRAMUSCULAR | Status: DC | PRN
  Administered 2011-08-02: 10 mg via INTRAVENOUS

## 2011-08-02 MED ORDER — VANCOMYCIN HCL IN DEXTROSE 1-5 GM/200ML-% IV SOLN
1000.0000 mg | Freq: Two times a day (BID) | INTRAVENOUS | Status: AC
  Administered 2011-08-02 – 2011-08-03 (×2): 1000 mg via INTRAVENOUS
  Filled 2011-08-02 (×2): qty 200

## 2011-08-02 MED ORDER — VITAMIN D3 25 MCG (1000 UNIT) PO TABS
2000.0000 [IU] | ORAL_TABLET | Freq: Every day | ORAL | Status: DC
  Administered 2011-08-03: 2000 [IU] via ORAL
  Filled 2011-08-02 (×2): qty 2

## 2011-08-02 MED ORDER — HEMOSTATIC AGENTS (NO CHARGE) OPTIME
TOPICAL | Status: DC | PRN
  Administered 2011-08-02: 1 via TOPICAL

## 2011-08-02 MED ORDER — DROPERIDOL 2.5 MG/ML IJ SOLN
INTRAMUSCULAR | Status: AC
  Filled 2011-08-02: qty 2

## 2011-08-02 MED ORDER — MENTHOL 3 MG MT LOZG
1.0000 | LOZENGE | OROMUCOSAL | Status: DC | PRN
  Administered 2011-08-03: 3 mg via ORAL
  Filled 2011-08-02: qty 9

## 2011-08-02 MED ORDER — VANCOMYCIN HCL IN DEXTROSE 1-5 GM/200ML-% IV SOLN
INTRAVENOUS | Status: AC
  Filled 2011-08-02: qty 200

## 2011-08-02 MED ORDER — LIDOCAINE HCL (CARDIAC) 20 MG/ML IV SOLN
INTRAVENOUS | Status: DC | PRN
  Administered 2011-08-02: 100 mg via INTRAVENOUS

## 2011-08-02 MED ORDER — LORATADINE 10 MG PO TABS
10.0000 mg | ORAL_TABLET | Freq: Every day | ORAL | Status: DC
  Administered 2011-08-03: 10 mg via ORAL
  Filled 2011-08-02: qty 1

## 2011-08-02 MED ORDER — LACTATED RINGERS IV SOLN
INTRAVENOUS | Status: DC | PRN
  Administered 2011-08-02 (×2): via INTRAVENOUS

## 2011-08-02 MED ORDER — ASPIRIN 81 MG PO CHEW
81.0000 mg | CHEWABLE_TABLET | Freq: Every day | ORAL | Status: DC
  Filled 2011-08-02: qty 1

## 2011-08-02 MED ORDER — CYCLOBENZAPRINE HCL 10 MG PO TABS
10.0000 mg | ORAL_TABLET | Freq: Three times a day (TID) | ORAL | Status: DC | PRN
  Filled 2011-08-02: qty 1

## 2011-08-02 MED ORDER — VALSARTAN-HYDROCHLOROTHIAZIDE 160-12.5 MG PO TABS: 1.0000 | ORAL_TABLET | Freq: Every day | ORAL | Status: DC

## 2011-08-02 MED ORDER — PROMETHAZINE HCL 25 MG/ML IJ SOLN
12.5000 mg | Freq: Four times a day (QID) | INTRAMUSCULAR | Status: DC | PRN
  Administered 2011-08-02 (×2): 12.5 mg via INTRAVENOUS
  Filled 2011-08-02 (×3): qty 1

## 2011-08-02 MED ORDER — OMEGA-3 FATTY ACIDS 1000 MG PO CAPS: 1.0000 g | ORAL_CAPSULE | Freq: Two times a day (BID) | ORAL | Status: DC

## 2011-08-02 MED ORDER — BACITRACIN 50000 UNITS IM SOLR
INTRAMUSCULAR | Status: AC
  Filled 2011-08-02: qty 1

## 2011-08-02 MED ORDER — FENTANYL CITRATE 0.05 MG/ML IJ SOLN
INTRAMUSCULAR | Status: DC | PRN
  Administered 2011-08-02: 50 ug via INTRAVENOUS
  Administered 2011-08-02: 100 ug via INTRAVENOUS
  Administered 2011-08-02 (×4): 50 ug via INTRAVENOUS

## 2011-08-02 MED ORDER — HYDROCHLOROTHIAZIDE 12.5 MG PO CAPS
12.5000 mg | ORAL_CAPSULE | Freq: Every day | ORAL | Status: DC
  Administered 2011-08-03: 12.5 mg via ORAL
  Filled 2011-08-02 (×2): qty 1

## 2011-08-02 MED ORDER — ACETAMINOPHEN 650 MG RE SUPP: 650.0000 mg | RECTAL | Status: DC | PRN

## 2011-08-02 MED ORDER — EPHEDRINE SULFATE 50 MG/ML IJ SOLN
INTRAMUSCULAR | Status: DC | PRN
  Administered 2011-08-02: 5 mg via INTRAVENOUS

## 2011-08-02 MED ORDER — DROPERIDOL 2.5 MG/ML IJ SOLN
0.6250 mg | INTRAMUSCULAR | Status: DC | PRN
  Administered 2011-08-02: 0.625 mg via INTRAVENOUS

## 2011-08-02 MED ORDER — VITAMIN D 50 MCG (2000 UT) PO CAPS: 2000.0000 [IU] | ORAL_CAPSULE | Freq: Every day | ORAL | Status: DC

## 2011-08-02 MED ORDER — VANCOMYCIN HCL IN DEXTROSE 1-5 GM/200ML-% IV SOLN
1000.0000 mg | Freq: Three times a day (TID) | INTRAVENOUS | Status: DC
  Filled 2011-08-02: qty 200

## 2011-08-02 MED ORDER — CEFAZOLIN SODIUM 1-5 GM-% IV SOLN: 1.0000 g | Freq: Three times a day (TID) | INTRAVENOUS | Status: DC

## 2011-08-02 MED ORDER — HYDROMORPHONE HCL PF 1 MG/ML IJ SOLN
0.2500 mg | INTRAMUSCULAR | Status: DC | PRN
  Administered 2011-08-02 (×2): 0.5 mg via INTRAVENOUS

## 2011-08-02 MED ORDER — IRBESARTAN 150 MG PO TABS
150.0000 mg | ORAL_TABLET | Freq: Every day | ORAL | Status: DC
  Administered 2011-08-03: 150 mg via ORAL
  Filled 2011-08-02 (×2): qty 1

## 2011-08-02 MED ORDER — LIDOCAINE HCL 4 % MT SOLN
OROMUCOSAL | Status: DC | PRN
  Administered 2011-08-02: 3 mL via TOPICAL

## 2011-08-02 MED ORDER — OMEGA-3-ACID ETHYL ESTERS 1 G PO CAPS
1.0000 g | ORAL_CAPSULE | Freq: Two times a day (BID) | ORAL | Status: DC
  Filled 2011-08-02 (×3): qty 1

## 2011-08-02 MED ORDER — HYDROMORPHONE HCL PF 1 MG/ML IJ SOLN
INTRAMUSCULAR | Status: AC
  Administered 2011-08-02: 0.5 mg via INTRAVENOUS
  Filled 2011-08-02: qty 1

## 2011-08-02 MED ORDER — ROCURONIUM BROMIDE 100 MG/10ML IV SOLN
INTRAVENOUS | Status: DC | PRN
  Administered 2011-08-02: 50 mg via INTRAVENOUS

## 2011-08-02 MED ORDER — HYDROMORPHONE HCL PF 1 MG/ML IJ SOLN
INTRAMUSCULAR | Status: AC
  Filled 2011-08-02: qty 1

## 2011-08-02 MED ORDER — PHENOL 1.4 % MT LIQD: 1.0000 | OROMUCOSAL | Status: DC | PRN

## 2011-08-02 MED ORDER — SODIUM CHLORIDE 0.9 % IJ SOLN: 3.0000 mL | Freq: Two times a day (BID) | INTRAMUSCULAR | Status: DC

## 2011-08-02 MED ORDER — VECURONIUM BROMIDE 10 MG IV SOLR
INTRAVENOUS | Status: DC | PRN
  Administered 2011-08-02 (×2): 2 mg via INTRAVENOUS

## 2011-08-02 MED ORDER — ASPIRIN 81 MG PO TABS: 81.0000 mg | ORAL_TABLET | Freq: Every day | ORAL | Status: DC

## 2011-08-02 MED ORDER — PANTOPRAZOLE SODIUM 20 MG PO TBEC
20.0000 mg | DELAYED_RELEASE_TABLET | Freq: Every day | ORAL | Status: DC
  Administered 2011-08-03: 20 mg via ORAL
  Filled 2011-08-02: qty 1

## 2011-08-02 MED ORDER — OXYCODONE-ACETAMINOPHEN 5-325 MG PO TABS
1.0000 | ORAL_TABLET | ORAL | Status: DC | PRN
  Administered 2011-08-03: 1 via ORAL
  Filled 2011-08-02: qty 1

## 2011-08-02 MED ORDER — ARTIFICIAL TEARS OP OINT
TOPICAL_OINTMENT | OPHTHALMIC | Status: DC | PRN
  Administered 2011-08-02: 1 via OPHTHALMIC

## 2011-08-02 MED ORDER — HYDROMORPHONE HCL PF 1 MG/ML IJ SOLN
0.5000 mg | INTRAMUSCULAR | Status: DC | PRN
  Administered 2011-08-02: 0.5 mg via INTRAVENOUS
  Administered 2011-08-02 – 2011-08-03 (×2): 1 mg via INTRAVENOUS
  Filled 2011-08-02 (×2): qty 1

## 2011-08-02 MED ORDER — THROMBIN 5000 UNITS EX SOLR
OROMUCOSAL | Status: DC | PRN
  Administered 2011-08-02: 09:00:00 via TOPICAL

## 2011-08-02 MED ORDER — ONDANSETRON HCL 4 MG/2ML IJ SOLN
4.0000 mg | INTRAMUSCULAR | Status: DC | PRN
  Administered 2011-08-02 (×2): 4 mg via INTRAVENOUS
  Filled 2011-08-02 (×2): qty 2

## 2011-08-02 MED ORDER — PROPOFOL 10 MG/ML IV BOLUS
INTRAVENOUS | Status: DC | PRN
  Administered 2011-08-02: 150 mg via INTRAVENOUS
  Administered 2011-08-02: 50 mg via INTRAVENOUS

## 2011-08-02 MED ORDER — ADULT MULTIVITAMIN W/MINERALS CH
1.0000 | ORAL_TABLET | Freq: Every day | ORAL | Status: DC
  Filled 2011-08-02 (×2): qty 1

## 2011-08-02 MED ORDER — GLYCOPYRROLATE 0.2 MG/ML IJ SOLN
INTRAMUSCULAR | Status: DC | PRN
  Administered 2011-08-02: .6 mg via INTRAVENOUS

## 2011-08-02 MED ORDER — SODIUM CHLORIDE 0.9 % IR SOLN
Status: DC | PRN
  Administered 2011-08-02: 08:00:00

## 2011-08-02 MED ORDER — ONDANSETRON HCL 4 MG/2ML IJ SOLN
INTRAMUSCULAR | Status: DC | PRN
  Administered 2011-08-02 (×2): 4 mg via INTRAVENOUS

## 2011-08-02 MED ORDER — NEOSTIGMINE METHYLSULFATE 1 MG/ML IJ SOLN
INTRAMUSCULAR | Status: DC | PRN
  Administered 2011-08-02: 4 mg via INTRAVENOUS

## 2011-08-02 SURGICAL SUPPLY — 64 items
ADH SKN CLS APL DERMABOND .7 (GAUZE/BANDAGES/DRESSINGS) ×1
APL SKNCLS STERI-STRIP NONHPOA (GAUZE/BANDAGES/DRESSINGS) ×1
BAG DECANTER FOR FLEXI CONT (MISCELLANEOUS) ×2 IMPLANT
BENZOIN TINCTURE PRP APPL 2/3 (GAUZE/BANDAGES/DRESSINGS) ×2 IMPLANT
BRUSH SCRUB EZ PLAIN DRY (MISCELLANEOUS) ×2 IMPLANT
BUR MATCHSTICK NEURO 3.0 LAGG (BURR) ×2 IMPLANT
CANISTER SUCTION 2500CC (MISCELLANEOUS) ×2 IMPLANT
CLOTH BEACON ORANGE TIMEOUT ST (SAFETY) ×2 IMPLANT
CONT SPEC 4OZ CLIKSEAL STRL BL (MISCELLANEOUS) ×2 IMPLANT
DECANTER SPIKE VIAL GLASS SM (MISCELLANEOUS) ×2 IMPLANT
DERMABOND ADVANCED (GAUZE/BANDAGES/DRESSINGS) ×1
DERMABOND ADVANCED .7 DNX12 (GAUZE/BANDAGES/DRESSINGS) ×1 IMPLANT
DRAPE C-ARM 42X72 X-RAY (DRAPES) ×4 IMPLANT
DRAPE LAPAROTOMY 100X72 PEDS (DRAPES) ×2 IMPLANT
DRAPE MICROSCOPE ZEISS OPMI (DRAPES) ×2 IMPLANT
DRAPE POUCH INSTRU U-SHP 10X18 (DRAPES) ×2 IMPLANT
DRILL BIT (BIT) ×1 IMPLANT
DRSG OPSITE 4X5.5 SM (GAUZE/BANDAGES/DRESSINGS) ×2 IMPLANT
ELECT COATED BLADE 2.86 ST (ELECTRODE) ×2 IMPLANT
ELECT REM PT RETURN 9FT ADLT (ELECTROSURGICAL) ×2
ELECTRODE REM PT RTRN 9FT ADLT (ELECTROSURGICAL) ×1 IMPLANT
GAUZE SPONGE 4X4 16PLY XRAY LF (GAUZE/BANDAGES/DRESSINGS) IMPLANT
GLOVE BIO SURGEON STRL SZ8 (GLOVE) ×3 IMPLANT
GLOVE BIOGEL PI IND STRL 7.0 (GLOVE) IMPLANT
GLOVE BIOGEL PI INDICATOR 7.0 (GLOVE) ×1
GLOVE EXAM NITRILE LRG STRL (GLOVE) IMPLANT
GLOVE EXAM NITRILE MD LF STRL (GLOVE) ×2 IMPLANT
GLOVE EXAM NITRILE XL STR (GLOVE) IMPLANT
GLOVE EXAM NITRILE XS STR PU (GLOVE) IMPLANT
GLOVE INDICATOR 8.5 STRL (GLOVE) ×2 IMPLANT
GLOVE SS BIOGEL STRL SZ 6.5 (GLOVE) IMPLANT
GLOVE SUPERSENSE BIOGEL SZ 6.5 (GLOVE) ×3
GLOVE SURG SS PI 6.5 STRL IVOR (GLOVE) ×1 IMPLANT
GOWN BRE IMP SLV AUR LG STRL (GOWN DISPOSABLE) ×1 IMPLANT
GOWN BRE IMP SLV AUR XL STRL (GOWN DISPOSABLE) ×4 IMPLANT
GOWN STRL REIN 2XL LVL4 (GOWN DISPOSABLE) IMPLANT
HEAD HALTER (SOFTGOODS) ×2 IMPLANT
HEMOSTAT POWDER KIT SURGIFOAM (HEMOSTASIS) IMPLANT
KIT BASIN OR (CUSTOM PROCEDURE TRAY) ×2 IMPLANT
KIT ROOM TURNOVER OR (KITS) ×2 IMPLANT
NDL SPNL 20GX3.5 QUINCKE YW (NEEDLE) ×1 IMPLANT
NEEDLE SPNL 20GX3.5 QUINCKE YW (NEEDLE) ×2 IMPLANT
NS IRRIG 1000ML POUR BTL (IV SOLUTION) ×2 IMPLANT
PACK LAMINECTOMY NEURO (CUSTOM PROCEDURE TRAY) ×2 IMPLANT
PLATE 3 57.5XLCK NS SPNE CVD (Plate) IMPLANT
PLATE 3 ATLANTIS TRANS (Plate) ×2 IMPLANT
PUTTY BONE DBX 2.5 MIS (Bone Implant) ×1 IMPLANT
RUBBERBAND STERILE (MISCELLANEOUS) ×4 IMPLANT
SCREW ST FIX 4 ATL 3120213 (Screw) ×8 IMPLANT
SPACER COLONIAL 7X14X12 (Spacer) ×1 IMPLANT
SPACER COLONIAL LGE 8MM 7DEG (Spacer) ×1 IMPLANT
SPACER COLONIAL SMALL 8MM (Spacer) ×1 IMPLANT
SPONGE GAUZE 4X4 12PLY (GAUZE/BANDAGES/DRESSINGS) ×2 IMPLANT
SPONGE INTESTINAL PEANUT (DISPOSABLE) ×2 IMPLANT
SPONGE SURGIFOAM ABS GEL 100 (HEMOSTASIS) ×2 IMPLANT
STRIP CLOSURE SKIN 1/2X4 (GAUZE/BANDAGES/DRESSINGS) ×2 IMPLANT
SUT VIC AB 3-0 SH 8-18 (SUTURE) ×2 IMPLANT
SUT VICRYL 4-0 PS2 18IN ABS (SUTURE) ×2 IMPLANT
SYR 20ML ECCENTRIC (SYRINGE) ×2 IMPLANT
TAPE CLOTH 4X10 WHT NS (GAUZE/BANDAGES/DRESSINGS) ×1 IMPLANT
TOWEL OR 17X24 6PK STRL BLUE (TOWEL DISPOSABLE) ×2 IMPLANT
TOWEL OR 17X26 10 PK STRL BLUE (TOWEL DISPOSABLE) ×2 IMPLANT
TRAP SPECIMEN MUCOUS 40CC (MISCELLANEOUS) ×2 IMPLANT
WATER STERILE IRR 1000ML POUR (IV SOLUTION) ×2 IMPLANT

## 2011-08-02 NOTE — H&P (Signed)
Heather Merritt is an 56 y.o. female.   Chief Complaint: Neck and bilateral arm pain numbness and tingling in her hands HPI: Patient is a very pleasant 56 year old female has had several months of progressively worsening neck pain with pain into both arms with numbness tingling and weakness in her hands as well as numbness and tingling in her legs and feet noted some balance difficulty. This is been refractory to all forms of conservative treatment anti-inflammatories prednisone physical therapy workup included MRI scan that showed severe spinal cord compression kyphotic deformity C3-4, C4-5, C5-6. Straight conservative treatment clinical exam consistent with myelopathy and imaging findings consistent with spinal cord compression she was recommended anterior cervical discectomies and fusion at these 3 levels. Benefits of the operation as well as perioperative course and expectations of outcome alternatives to surgery were all spine the patient she understands and agrees to proceed forward.  Past Medical History  Diagnosis Date  . ANXIETY 10/28/2007  . DIVERTICULOSIS, COLON 10/28/2007  . HYPERLIPIDEMIA 10/28/2007    unable to take meds d/t allergies  . LIVER FUNCTION TESTS, ABNORMAL, HX OF 10/29/2007  . OSTEOPENIA 10/29/2007  . Presbyacusis 01/04/2010  . Irritable bowel syndrome (IBS)   . Migraine   . Obesity   . Vitamin d deficiency     takes Vit D daily  . Lumbar disc disease   . HYPERTENSION 10/28/2007    takes Diovan daily  . Seasonal allergies     takes Clarinex daily  . Vertigo     hx of  . Arthritis   . Joint pain   . Joint swelling   . Chronic back pain   . Osteoporosis   . Dry skin     red patch on right knee area  . Rosacea   . GERD 10/28/2007    takes Prevacid daily  . H/O hiatal hernia   . Internal hemorrhoids   . Hx of colonic polyps   . Cataracts, bilateral     immature    Past Surgical History  Procedure Date  . Tonsillectomy   . Tonsillectomy at age 12  .  Colonoscopy 2001/2013  . Esophagogastroduodenoscopy     Family History  Problem Relation Age of Onset  . Atrial fibrillation Mother   . Hyperlipidemia Mother   . Stroke Mother   . Atrial fibrillation Father   . Hypothyroidism Sister   . Hypothyroidism Maternal Grandmother   . Anesthesia problems Neg Hx   . Hypotension Neg Hx   . Malignant hyperthermia Neg Hx   . Pseudochol deficiency Neg Hx    Social History:  reports that she has quit smoking. She does not have any smokeless tobacco history on file. She reports that she drinks alcohol. She reports that she does not use illicit drugs.  Allergies:  Allergies  Allergen Reactions  . Atorvastatin     REACTION: muscle weakness  . Penicillins     REACTION: rash  . Rosuvastatin     REACTION: muscle weakness  . Simvastatin     REACTION: muscle weakness  . Sulfonamide Derivatives     REACTION: nausea, abd pain    Medications Prior to Admission  Medication Sig Dispense Refill  . aspirin 81 MG tablet Take 81 mg by mouth daily.        . Calcium Citrate (CITRACAL PO) Take 2 tablets by mouth 2 (two) times daily.      . Cholecalciferol (VITAMIN D) 2000 UNITS CAPS Take 2,000 Units by mouth daily.      Marland Kitchen  desloratadine (CLARINEX) 5 MG tablet Take 5 mg by mouth daily.      . fish oil-omega-3 fatty acids 1000 MG capsule Take 1 g by mouth 2 (two) times daily.      Marland Kitchen ibuprofen (ADVIL,MOTRIN) 200 MG tablet Take 600 mg by mouth every 6 (six) hours as needed. Pain      . lansoprazole (PREVACID) 15 MG capsule Take 15 mg by mouth daily.      . metroNIDAZOLE (METROGEL) 0.75 % gel Apply 1 application topically at bedtime.      . Multiple Vitamin (MULITIVITAMIN WITH MINERALS) TABS Take 1 tablet by mouth daily.      . pimecrolimus (ELIDEL) 1 % cream Apply 1 application topically every morning.      . valsartan-hydrochlorothiazide (DIOVAN-HCT) 160-12.5 MG per tablet Take 1 tablet by mouth daily.      . vitamin E 400 UNIT capsule Take 400 Units by  mouth daily.        No results found for this or any previous visit (from the past 48 hour(s)). No results found.  Review of Systems  Constitutional: Negative.   HENT: Positive for neck pain.   Eyes: Negative.   Respiratory: Negative.   Cardiovascular: Negative.   Gastrointestinal: Negative.   Genitourinary: Negative.   Musculoskeletal: Positive for myalgias and back pain.  Skin: Negative.   Neurological: Positive for tingling and sensory change.  Psychiatric/Behavioral: Negative.     Blood pressure 114/82, pulse 66, temperature 98 F (36.7 C), temperature source Oral, resp. rate 18, SpO2 98.00%. Physical Exam  Constitutional: She is oriented to person, place, and time. She appears well-developed and well-nourished.  HENT:  Head: Normocephalic and atraumatic.  Eyes: Pupils are equal, round, and reactive to light.  Neck: Normal range of motion.  Cardiovascular: Normal rate.   Respiratory: Breath sounds normal.  GI: Soft.  Neurological: She is alert and oriented to person, place, and time. She has normal strength. GCS eye subscore is 4. GCS verbal subscore is 5. GCS motor subscore is 6.  Reflex Scores:      Patellar reflexes are 0 on the right side and 0 on the left side.      Achilles reflexes are 0 on the right side and 0 on the left side.      Strength is 5 out of 5 in her deltoids biceps triceps wrist flexion extension and intrinsics lower extra and also 5 out of 5 in her iliopsoas quads hamstrings gastrocs and EHL.     Assessment/Plan 56 year old female presents for anterior cervical discectomies and fusion C3-4, C4-5, C5-6. Risks and benefits of explained as well as perioperative course and expectations of outcome alternatives to surgery she understands and agrees to proceed forward.  Donshay Lupinski P 08/02/2011, 7:21 AM

## 2011-08-02 NOTE — Anesthesia Procedure Notes (Signed)
Procedure Name: Intubation Date/Time: 08/02/2011 7:37 AM Performed by: Margaree Mackintosh Pre-anesthesia Checklist: Patient identified, Timeout performed, Emergency Drugs available, Suction available and Patient being monitored Patient Re-evaluated:Patient Re-evaluated prior to inductionOxygen Delivery Method: Circle system utilized Preoxygenation: Pre-oxygenation with 100% oxygen Intubation Type: IV induction Ventilation: Mask ventilation without difficulty and Oral airway inserted - appropriate to patient size Laryngoscope Size: Mac, 3, Miller and 2 Grade View: Grade II Tube type: Oral Tube size: 7.0 mm Number of attempts: 2 Airway Equipment and Method: Stylet and LTA kit utilized Placement Confirmation: ETT inserted through vocal cords under direct vision,  positive ETCO2 and breath sounds checked- equal and bilateral Secured at: 22 cm Tube secured with: Tape Dental Injury: Teeth and Oropharynx as per pre-operative assessment

## 2011-08-02 NOTE — Preoperative (Signed)
Beta Blockers   Reason not to administer Beta Blockers:Not Applicable 

## 2011-08-02 NOTE — Progress Notes (Signed)
ANTIBIOTIC CONSULT NOTE - INITIAL  Pharmacy Consult for Vancomycin x 24hr Indication: Post-op prophylaxis  Allergies  Allergen Reactions  . Atorvastatin     REACTION: muscle weakness  . Penicillins     REACTION: rash  . Rosuvastatin     REACTION: muscle weakness  . Simvastatin     REACTION: muscle weakness  . Sulfonamide Derivatives     REACTION: nausea, abd pain    Patient Measurements: Height: 5\' 4"  (162.6 cm) Weight: 183 lb 10.3 oz (83.3 kg) IBW/kg (Calculated) : 54.7    Vital Signs: Temp: 97.7 F (36.5 C) (05/02 1210) Temp src: Oral (05/02 0614) BP: 119/68 mmHg (05/02 1210) Pulse Rate: 67  (05/02 1210) Intake/Output from previous day:   Intake/Output from this shift: Total I/O In: 1800 [I.V.:1700; IV Piggyback:100] Out: 469 [Urine:300; Drains:19; Blood:150]  Labs: No results found for this basename: WBC:3,HGB:3,PLT:3,LABCREA:3,CREATININE:3 in the last 72 hours Estimated Creatinine Clearance: 80 ml/min (by C-G formula based on Cr of 0.64). No results found for this basename: VANCOTROUGH:2,VANCOPEAK:2,VANCORANDOM:2,GENTTROUGH:2,GENTPEAK:2,GENTRANDOM:2,TOBRATROUGH:2,TOBRAPEAK:2,TOBRARND:2,AMIKACINPEAK:2,AMIKACINTROU:2,AMIKACIN:2, in the last 72 hours   Microbiology: Recent Results (from the past 720 hour(s))  SURGICAL PCR SCREEN     Status: Normal   Collection Time   07/25/11  8:26 AM      Component Value Range Status Comment   MRSA, PCR NEGATIVE  NEGATIVE  Final    Staphylococcus aureus NEGATIVE  NEGATIVE  Final     Medical History: Past Medical History  Diagnosis Date  . ANXIETY 10/28/2007  . DIVERTICULOSIS, COLON 10/28/2007  . HYPERLIPIDEMIA 10/28/2007    unable to take meds d/t allergies  . LIVER FUNCTION TESTS, ABNORMAL, HX OF 10/29/2007  . OSTEOPENIA 10/29/2007  . Presbyacusis 01/04/2010  . Irritable bowel syndrome (IBS)   . Migraine   . Obesity   . Vitamin d deficiency     takes Vit D daily  . Lumbar disc disease   . HYPERTENSION 10/28/2007   takes Diovan daily  . Seasonal allergies     takes Clarinex daily  . Vertigo     hx of  . Arthritis   . Joint pain   . Joint swelling   . Chronic back pain   . Osteoporosis   . Dry skin     red patch on right knee area  . Rosacea   . GERD 10/28/2007    takes Prevacid daily  . H/O hiatal hernia   . Internal hemorrhoids   . Hx of colonic polyps   . Cataracts, bilateral     immature    Medications:  Scheduled:    . aspirin  81 mg Oral Daily  . bacitracin      . cholecalciferol  2,000 Units Oral Daily  . droperidol      . irbesartan  150 mg Oral Daily   And  . hydrochlorothiazide  12.5 mg Oral Daily  . HYDROmorphone      . loratadine  10 mg Oral Daily  . mulitivitamin with minerals  1 tablet Oral Daily  . omega-3 acid ethyl esters  1 g Oral BID  . pantoprazole  20 mg Oral Q1200  . sodium chloride      . sodium chloride  3 mL Intravenous Q12H  . vancomycin      . vitamin E  400 Units Oral Daily  . DISCONTD: aspirin  81 mg Oral Daily  . DISCONTD:  ceFAZolin (ANCEF) IV  1 g Intravenous Q8H  . DISCONTD: fish oil-omega-3 fatty acids  1 g Oral BID  .  DISCONTD: valsartan-hydrochlorothiazide  1 tablet Oral Daily  . DISCONTD: Vitamin D  2,000 Units Oral Daily   Assessment: 55yo female s/p C3-4, C4-5, C5-6 anterior cervical discectomies & fusions.  She is to receive Vancomycin x 24hr post-op.  She received Vancomycin 500mg  IV in the OR, ~0830 per the anesthesia flow sheet.  Goal of Therapy:  Prevention of infection  Plan:  Vancomycin 1000mg  IV q12 x 2 doses.  Marisue Humble, PharmD Clinical Pharmacist [facility]

## 2011-08-02 NOTE — Op Note (Signed)
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 Atlantis translational plating from C3-C6 with 8 fixed angle 13 mm screws  Surgeon: Jillyn Hidden Leonce Bale  Assistant: Marikay Alar  Anesthesia: Gen.  EBL: Less than 100  History of present illness: Patient is a very pleasant 56 year old female presents with neck pain bilateral arm numbness tingling weakness in her hands and difficulty walking and clinical exam was consistent with a myelopathy and imaging findings revealed severe spinal cord compression from cervical spondylosis with stenosis at C3-4 C4-5 and C5-6 patient failed all forms of conservative treatment was recommended anterior cervical discectomies and fusion the risks and benefits of the operation as well as a perioperative course and expectations of outcome alternatives to surgery were all spine the patient she understood and agreed to proceed forward.  Operative procedure: Patient was brought into the or was induced under general anesthesia positioned supine the neck in slight extension in 5 pounds of halter traction. The right 7 neck was prepped and draped in routine sterile fashion the preoperative x-ray identify the appropriate level so a curvilinear incision was made just off midline to the intracortical the sternomastoid and the superficial layer of the platysma was dissected and divided longitudinally. The avascular tissue sternomastoid and strap muscles in the prevertebral fascia was dissected away with Kitners. Interoperative X. identify the C4-5 disc space level the after the longus Richardson Dopp was reflected laterally and self-retaining retractors placed. The disc spaces were all incised a large anterior aspect of did not a 2 and 3 mm in punch as well as  a Leksell rongeur the all 3 disc spaces were then drilled down with a high-speed drill capturing the bone shavings in a mucous trap. First working at C3-4 the operating microscope was draped and brought in the field under microscopic illumination this disc space was further drilled down the posterior annuluscomplex. There is a large posterior aspect of the C3 vertebral body this was aggressively under been decompress the central canal March across laterally and the level of both C4 pedicles were palpated and both C4 neuroforamina were confirmed patent. At this point the endplates and scraped appearance he allograft after adequate decompression achieved both centrally and foraminally. A 8 mm globus peek cages packed with local a fracture of DBX inserted C3-4. C4-5 disc space is drilled and a similar fashion aggressive and viable template and removed a very large posterior spur coming off the C4 vertebral body decompress the central canal both C5 nerve roots were identified there was some proximal stenosis in the left C5 nerve root this is all aggressively under bitten until the C5 foramen easily accepted nerve root. After adequate central and foraminal decompression been achieved endplates were then prepared and a 7 mm peek cage packable local are graft mixed with DBX was then inserted at C4-5. Both grafts were inserted went to the C2 anteverted by line. A similar fashion disc space is drilled down a very large posterior spur coming off the C5 vertebral body was aggressively under been decompress the central canal as well as a large foraminal spur was also decompressed skeletonized and the C6 nerve root on the left C6 the right was also decompressed and after adequate central and foraminal decompression achieved, was copious irrigated end plates prepped with carried out in another 8 mm  cage was inserted. Then a 57-1/2 mm Atlantis translational plate was placed all screws excellent purchase locking mechanisms were  engaged. The wounds and copiously irrigated meticulous in space was maintained a 7 a J-P drain was placed posterior fluoroscopy fluoroscopy confirmed good position of the plate screws and implants. Then the wounds closed in layers with interrupted Vicryl in the platysma and a running 4 subcuticular benzoin Steri-Strips were applied patient recovered in stable condition at the end of case all needle counts and sponge counts were correct.

## 2011-08-02 NOTE — Transfer of Care (Signed)
Immediate Anesthesia Transfer of Care Note  Patient: Heather Merritt  Procedure(s) Performed: Procedure(s) (LRB): ANTERIOR CERVICAL DECOMPRESSION/DISCECTOMY FUSION 3 LEVELS (N/A)  Patient Location: PACU  Anesthesia Type: General  Level of Consciousness: awake, alert  and oriented  Airway & Oxygen Therapy: Patient Spontanous Breathing and Patient connected to nasal cannula oxygen  Post-op Assessment: Report given to PACU RN, Post -op Vital signs reviewed and stable and Patient moving all extremities X 4  Post vital signs: Reviewed and stable  Complications: No apparent anesthesia complications

## 2011-08-02 NOTE — Anesthesia Preprocedure Evaluation (Signed)
Anesthesia Evaluation  Patient identified by MRN, date of birth, ID band Patient awake    Reviewed: Allergy & Precautions, H&P , NPO status , Patient's Chart, lab work & pertinent test results  History of Anesthesia Complications Negative for: history of anesthetic complications  Airway Mallampati: II TM Distance: >3 FB Neck ROM: Full    Dental  (+) Teeth Intact and Dental Advisory Given   Pulmonary neg pulmonary ROS,  breath sounds clear to auscultation  Pulmonary exam normal       Cardiovascular hypertension, Pt. on medications Rhythm:Regular Rate:Normal     Neuro/Psych Anxiety    GI/Hepatic Neg liver ROS, GERD-  Controlled and Medicated,  Endo/Other  negative endocrine ROS  Renal/GU negative Renal ROS     Musculoskeletal   Abdominal   Peds  Hematology negative hematology ROS (+)   Anesthesia Other Findings   Reproductive/Obstetrics                           Anesthesia Physical Anesthesia Plan  ASA: II  Anesthesia Plan: General   Post-op Pain Management:    Induction: Intravenous  Airway Management Planned: Oral ETT  Additional Equipment:   Intra-op Plan:   Post-operative Plan: Extubation in OR  Informed Consent: I have reviewed the patients History and Physical, chart, labs and discussed the procedure including the risks, benefits and alternatives for the proposed anesthesia with the patient or authorized representative who has indicated his/her understanding and acceptance.   Dental advisory given  Plan Discussed with: CRNA and Surgeon  Anesthesia Plan Comments:         Anesthesia Quick Evaluation

## 2011-08-02 NOTE — Anesthesia Postprocedure Evaluation (Signed)
Anesthesia Post Note  Patient: Heather Merritt  Procedure(s) Performed: Procedure(s) (LRB): ANTERIOR CERVICAL DECOMPRESSION/DISCECTOMY FUSION 3 LEVELS (N/A)  Anesthesia type: general  Patient location: PACU  Post pain: Pain level controlled  Post assessment: Patient's Cardiovascular Status Stable  Last Vitals:  Filed Vitals:   08/02/11 1059  BP:   Pulse: 67  Temp:   Resp: 16    Post vital signs: Reviewed and stable  Level of consciousness: sedated  Complications: No apparent anesthesia complications

## 2011-08-03 MED ORDER — PROMETHAZINE HCL 12.5 MG PO TABS: 12.5000 mg | ORAL_TABLET | Freq: Four times a day (QID) | ORAL | Status: DC | PRN

## 2011-08-03 MED ORDER — CYCLOBENZAPRINE HCL 10 MG PO TABS: 10.0000 mg | ORAL_TABLET | Freq: Three times a day (TID) | ORAL | Status: AC | PRN

## 2011-08-03 MED ORDER — OXYCODONE-ACETAMINOPHEN 5-325 MG PO TABS: 1.0000 | ORAL_TABLET | ORAL | Status: AC | PRN

## 2011-08-03 NOTE — Discharge Summary (Signed)
  Physician Discharge Summary  Patient ID: Heather Merritt MRN: [id number] DOB/AGE: 04-09-1955 56 y.o.  Admit date: 08/02/2011 Discharge date: 08/03/2011  Admission Diagnoses: Cervical spondylitic myelopathy  Discharge Diagnoses: Same Active Problems:  * No active hospital problems. *    Discharged Condition: good  Hospital Course: Patient is a 56 year old female who presented to the hospital yesterday underwent an anterior cervical discectomy and fusion C4 C4-5 and C5-6. Sharply patient did very well return in the floor on the floor she convalesced well had some trouble with postoperative nausea but this resolved in the evening of postop day 0 and she was tolerated a diet by the morning of postop day 1 and living and voiding spontaneously wound is clean and dry will be discharged home scheduled followup in approximately one week.  Consults: Significant Diagnostic Studies: Treatments: ACDF C3-4 C4-5 and C5-6 Discharge Exam: Blood pressure 97/61, pulse 79, temperature 97.4 F (36.3 C), temperature source Oral, resp. rate 16, height 5\' 4"  (1.626 m), weight 83.3 kg (183 lb 10.3 oz), SpO2 92.00%. Strength out of 5 wound clean and dry  Disposition: Home   Medication List  As of 08/03/2011  9:04 AM   TAKE these medications         aspirin 81 MG tablet   Take 81 mg by mouth daily.      CITRACAL PO   Take 2 tablets by mouth 2 (two) times daily.      cyclobenzaprine 10 MG tablet   Commonly known as: FLEXERIL   Take 1 tablet (10 mg total) by mouth 3 (three) times daily as needed for muscle spasms.      desloratadine 5 MG tablet   Commonly known as: CLARINEX   Take 5 mg by mouth daily.      fish oil-omega-3 fatty acids 1000 MG capsule   Take 1 g by mouth 2 (two) times daily.      ibuprofen 200 MG tablet   Commonly known as: ADVIL,MOTRIN   Take 600 mg by mouth every 6 (six) hours as needed. Pain      lansoprazole 15 MG capsule   Commonly known as: PREVACID   Take 15 mg by mouth  daily.      metroNIDAZOLE 0.75 % gel   Commonly known as: METROGEL   Apply 1 application topically at bedtime.      mulitivitamin with minerals Tabs   Take 1 tablet by mouth daily.      oxyCODONE-acetaminophen 5-325 MG per tablet   Commonly known as: PERCOCET   Take 1-2 tablets by mouth every 4 (four) hours as needed.      pimecrolimus 1 % cream   Commonly known as: ELIDEL   Apply 1 application topically every morning.      promethazine 12.5 MG tablet   Commonly known as: PHENERGAN   Take 1 tablet (12.5 mg total) by mouth every 6 (six) hours as needed for nausea.      valsartan-hydrochlorothiazide 160-12.5 MG per tablet   Commonly known as: DIOVAN-HCT   Take 1 tablet by mouth daily.      Vitamin D 2000 UNITS Caps   Take 2,000 Units by mouth daily.      vitamin E 400 UNIT capsule   Take 400 Units by mouth daily.             Signed: Jamarria Real P 08/03/2011, 9:04 AM

## 2011-08-03 NOTE — Discharge Instructions (Signed)
Wound Care Keep incision covered and dry for 2 days.   You may remove outer bandage after 2 days and shower.  Do not put any creams, lotions, or ointments on incision. Leave steri-strips on neck.  They will fall off by themselves. Activity Walk each and every day, increasing distance each day. No lifting greater than 5 lbs.  Avoid excessive neck motion. No driving for 2 weeks; may ride as a passenger locally. Wear neck brace at all times except when showering or otherwise instructed. Diet Resume your normal diet.  Return to Work Will be discussed at you follow up appointment. Call Your Doctor If Any of These Occur Redness, drainage, or swelling at the wound.  Temperature greater than 101 degrees. Severe pain not relieved by pain medication. Increased difficulty swallowing.  Incision starts to come apart. Follow Up Appt Call today for appointment in 1-2 weeks ([phone]) or for problems.  If you have any hardware placed in your spine, you will need an x-ray before your appointment.

## 2011-08-03 NOTE — Progress Notes (Signed)
Subjective: Patient reports She feels much better she has significantly improved numbness tingling arms and hands  Objective: Vital signs in last 24 hours: Temp:  [96.8 F (36 C)-98 F (36.7 C)] 97.4 F (36.3 C) (05/03 0758) Pulse Rate:  [64-83] 79  (05/03 0758) Resp:  [13-23] 16  (05/03 0758) BP: (97-146)/(55-80) 97/61 mmHg (05/03 0758) SpO2:  [92 %-99 %] 92 % (05/03 0758) Weight:  [83.3 kg (183 lb 10.3 oz)] 83.3 kg (183 lb 10.3 oz) (05/02 1300)  Intake/Output from previous day: 05/02 0701 - 05/03 0700 In: 1840 [I.V.:1700; IV Piggyback:100] Out: 1814 [Urine:1300; Emesis/NG output:300; Drains:64; Blood:150] Intake/Output this shift:    Wound is clean and dry neurologically stable  Lab Results: No results found for this basename: WBC:2,HGB:2,HCT:2,PLT:2 in the last 72 hours BMET No results found for this basename: NA:2,K:2,CL:2,CO2:2,GLUCOSE:2,BUN:2,CREATININE:2,CALCIUM:2 in the last 72 hours  Studies/Results: Dg Cervical Spine 1 View  08/02/2011  *RADIOLOGY REPORT*  Clinical Data: Cervical fusion.  DG CERVICAL SPINE - 1 VIEW  Comparison: Cervical spine MRI 04/18/2011.  Findings: Lateral cervical spine film demonstrates anterior plate and screws and interbody bone spacers fusing C3-4, C4-5 and C5-6. Normal alignment.  No complicating features.  IMPRESSION: Anterior and interbody fusion changes from C3-C6.  Original Report Authenticated By: P. Loralie Champagne, M.D.   Dg C-arm 1-60 Min  08/02/2011  *RADIOLOGY REPORT*  Clinical Data: Cervical fusion.  C-ARM 1-60 MIN  Comparison: Cervical spine MRI 04/18/2011.  Findings: Lateral cervical spine film demonstrates anterior plate and screws and interbody bone plugs at C3-4, C4-5 and C5-6. Normal alignment.  No complicating features.  IMPRESSION: The anterior and interbody fusion changes at C3-4, C4-5 and C5-6.  Original Report Authenticated By: P. Loralie Champagne, M.D.    Assessment/Plan: Postoperative day 1 from an ACDF  LOS: 1 day      Heather Merritt P 08/03/2011, 9:00 AM

## 2011-08-06 ENCOUNTER — Encounter (HOSPITAL_COMMUNITY): Payer: Self-pay | Admitting: Neurosurgery

## 2011-12-19 ENCOUNTER — Ambulatory Visit (HOSPITAL_COMMUNITY)
Admission: RE | Admit: 2011-12-19 | Discharge: 2011-12-19 | Disposition: A | Discharge status: Home / Self Care | Payer: Managed Care, Other (non HMO) | Source: Ambulatory Visit | Attending: Neurosurgery | Admitting: Neurosurgery

## 2011-12-19 DIAGNOSIS — R1310 Dysphagia, unspecified: Secondary | ICD-10-CM | POA: Insufficient documentation

## 2011-12-19 DIAGNOSIS — Z79899 Other long term (current) drug therapy: Secondary | ICD-10-CM | POA: Insufficient documentation

## 2011-12-19 DIAGNOSIS — K219 Gastro-esophageal reflux disease without esophagitis: Secondary | ICD-10-CM | POA: Insufficient documentation

## 2011-12-19 NOTE — Procedures (Signed)
Objective Swallowing Evaluation: Modified Barium Swallowing Study  Patient Details  Name: Heather Merritt MRN: [id number] Date of Birth: [birth year]  Today's Date: 12/19/2011 Time: 2952-8413 SLP Time Calculation (min): 33 min  Past Medical History:  Past Medical History  Diagnosis Date  . ANXIETY 10/28/2007  . DIVERTICULOSIS, COLON 10/28/2007  . HYPERLIPIDEMIA 10/28/2007    unable to take meds d/t allergies  . LIVER FUNCTION TESTS, ABNORMAL, HX OF 10/29/2007  . OSTEOPENIA 10/29/2007  . Presbyacusis 01/04/2010  . Irritable bowel syndrome (IBS)   . Migraine   . Obesity   . Vitamin d deficiency     takes Vit D daily  . Lumbar disc disease   . HYPERTENSION 10/28/2007    takes Diovan daily  . Seasonal allergies     takes Clarinex daily  . Vertigo     hx of  . Arthritis   . Joint pain   . Joint swelling   . Chronic back pain   . Osteoporosis   . Dry skin     red patch on right knee area  . Rosacea   . GERD 10/28/2007    takes Prevacid daily  . H/O hiatal hernia   . Internal hemorrhoids   . Hx of colonic polyps   . Cataracts, bilateral     immature   Past Surgical History:  Past Surgical History  Procedure Date  . Tonsillectomy   . Tonsillectomy at age 61  . Colonoscopy 2001/2013  . Esophagogastroduodenoscopy   . Anterior cervical decomp/discectomy fusion 08/02/2011    Procedure: ANTERIOR CERVICAL DECOMPRESSION/DISCECTOMY FUSION 3 LEVELS;  Surgeon: Mariam Dollar, MD;  Location: [facility];  Service: Neurosurgery;  Laterality: N/A;  Cervical Three-Four, Cervical Four-Five, Cervical Five-Six  Anterior Cervical Decompression Fusion    HPI:  56 yo female referred by Dr Wynetta Emery for outpt MBS due to pt report of dysphagia.  Pt is s/p ACDF C3-C5 (per her report) in May 2013.  Pt stated her dysphagia is characterized by having to swallow effortfully especially with bread.  Pt pointed to pharynx stating sensation of intermittent stasis clears when she drinks liquids.  She also reports  sensation of "tightness" at pharynx at times.  Pt does admit to h/o GERD and she has taken Prevacid 15 mg once a day since 1998 per her statement.  Pt denies symptoms of reflux but does avoid mints and chocolates.       Assessment / Plan / Recommendation Clinical Impression  Dysphagia Diagnosis: Within Functional Limits Clinical impression: Pt presents with functional oropharyngeal swallow without aspiration or penetration of any consistency tested.  Pt noted to throat clear during testing without barium infiltrated into larynx.  Appearance of stasis at mid esophagus with pudding noted- referrant sensation to pharynx- adequately cleared with thin swallow.  Barium tablet appeared to lodge at GE (pt reports she has a hiatal hernia) , appeared to clear adequately with further bolus of thin.  SLP suspects pt's symptoms are due to her known GERD.  Reflux symptom index administered with pt scoring 17 of 45 - indicating per authors a high likelihood that pt is having Laryngopharyngeal reflux.    SLP provided pt with tips to compensate for known GERD and RSI to re-test monthly for functional improvement.  Pt also states she is going to increase her Prevacid to 15 mg BID - as she cut it down to once a day 15 mg in Nov 2012 and only reports worsening symptoms since neck surgery.  Thanks for  this order.    Treatment Recommendation       Diet Recommendation Regular;Thin liquid   Liquid Administration via: Cup Supervision: Patient able to self feed Compensations: Slow rate;Small sips/bites;Follow solids with liquid Postural Changes and/or Swallow Maneuvers: Seated upright 90 degrees;Upright 30-60 min after meal    Other  Recommendations Oral Care Recommendations: Oral care BID   Follow Up Recommendations  None           SLP Swallow Goals     General Date of Onset: 12/19/11 HPI: 56 yo female referred by Dr Wynetta Emery for outpt MBS due to pt report of dysphagia.  Pt is s/p ACDF C3-C5 (per her report) in  May 2013.  Pt stated her dysphagia is characterized by having to swallow effortfully especially with bread.  Pt pointed to pharynx stating sensation of intermittent stasis clears when she drinks liquids.  She also reports sensation of "tightness" at pharynx at times.  Pt does admit to h/o GERD and she has taken Prevacid 15 mg once a day since 1998 per her statement.  Pt denies symptoms of reflux but does avoid mints and chocolates.   Type of Study: Modified Barium Swallowing Study Reason for Referral: Objectively evaluate swallowing function Previous Swallow Assessment: Pt states Dr Marina Goodell GI did endoscopic eval in 2001- uncertain of results Diet Prior to this Study: Regular;Thin liquids Temperature Spikes Noted: No Respiratory Status: Room air History of Recent Intubation:  (May 2013 for surgery) Behavior/Cognition: Alert Oral Cavity - Dentition: Adequate natural dentition Oral Motor / Sensory Function: Within functional limits Self-Feeding Abilities: Able to feed self Patient Positioning: Upright in bed Baseline Vocal Quality:  (pt states voice is baseline) Volitional Cough: Strong Volitional Swallow: Able to elicit Anatomy: Within functional limits Pharyngeal Secretions: Not observed secondary MBS    Reason for Referral Objectively evaluate swallowing function   Oral Phase Oral Preparation/Oral Phase Oral Phase: WFL   Pharyngeal Phase Pharyngeal Phase Pharyngeal Phase: Within functional limits  Cervical Esophageal Phase    GO    Cervical Esophageal Phase Cervical Esophageal Phase: [facility]         Donavan Burnet, MS [facility] SLP [phone]

## 2011-12-27 ENCOUNTER — Other Ambulatory Visit: Payer: Self-pay | Admitting: Obstetrics

## 2011-12-27 ENCOUNTER — Telehealth: Payer: Self-pay

## 2011-12-27 DIAGNOSIS — Z1231 Encounter for screening mammogram for malignant neoplasm of breast: Secondary | ICD-10-CM

## 2011-12-27 DIAGNOSIS — Z0000 Encounter for general adult medical examination without abnormal findings: Secondary | ICD-10-CM

## 2011-12-27 NOTE — Telephone Encounter (Signed)
Put order in for lab. 

## 2012-01-25 ENCOUNTER — Other Ambulatory Visit: Payer: Self-pay | Admitting: Otolaryngology

## 2012-01-25 DIAGNOSIS — R1310 Dysphagia, unspecified: Secondary | ICD-10-CM

## 2012-01-31 ENCOUNTER — Ambulatory Visit
Admission: RE | Admit: 2012-01-31 | Discharge: 2012-01-31 | Disposition: A | Discharge status: Home / Self Care | Payer: Managed Care, Other (non HMO) | Source: Ambulatory Visit | Attending: Obstetrics | Admitting: Obstetrics

## 2012-01-31 DIAGNOSIS — Z1231 Encounter for screening mammogram for malignant neoplasm of breast: Secondary | ICD-10-CM

## 2012-02-04 ENCOUNTER — Ambulatory Visit
Admission: RE | Admit: 2012-02-04 | Discharge: 2012-02-04 | Disposition: A | Discharge status: Home / Self Care | Payer: Managed Care, Other (non HMO) | Source: Ambulatory Visit | Attending: Otolaryngology | Admitting: Otolaryngology

## 2012-02-04 DIAGNOSIS — R1310 Dysphagia, unspecified: Secondary | ICD-10-CM

## 2012-02-06 ENCOUNTER — Other Ambulatory Visit: Payer: Self-pay | Admitting: Obstetrics

## 2012-02-06 DIAGNOSIS — M810 Age-related osteoporosis without current pathological fracture: Secondary | ICD-10-CM

## 2012-02-19 ENCOUNTER — Ambulatory Visit
Admission: RE | Admit: 2012-02-19 | Discharge: 2012-02-19 | Disposition: A | Discharge status: Home / Self Care | Payer: Managed Care, Other (non HMO) | Source: Ambulatory Visit | Attending: Obstetrics | Admitting: Obstetrics

## 2012-02-19 DIAGNOSIS — M810 Age-related osteoporosis without current pathological fracture: Secondary | ICD-10-CM

## 2012-03-10 ENCOUNTER — Other Ambulatory Visit (INDEPENDENT_AMBULATORY_CARE_PROVIDER_SITE_OTHER): Payer: Managed Care, Other (non HMO)

## 2012-03-10 DIAGNOSIS — Z0000 Encounter for general adult medical examination without abnormal findings: Secondary | ICD-10-CM

## 2012-03-10 LAB — LIPID PANEL
Cholesterol: 293 mg/dL — ABNORMAL HIGH (ref 0–200)
HDL: 34.5 mg/dL — ABNORMAL LOW (ref >39.00)
Total CHOL/HDL Ratio: 8
Triglycerides: 334 mg/dL — ABNORMAL HIGH (ref 0.0–149.0)
VLDL: 66.8 mg/dL — ABNORMAL HIGH (ref 0.0–40.0)

## 2012-03-10 LAB — CBC WITH DIFFERENTIAL/PLATELET
Basophils Absolute: 0 10*3/uL (ref 0.0–0.1)
Basophils Relative: 0.4 % (ref 0.0–3.0)
Eosinophils Absolute: 0 10*3/uL (ref 0.0–0.7)
Eosinophils Relative: 1.2 % (ref 0.0–5.0)
HCT: 42.7 % (ref 36.0–46.0)
Hemoglobin: 14.4 g/dL (ref 12.0–15.0)
Lymphocytes Relative: 32.2 % (ref 12.0–46.0)
Lymphs Abs: 1.2 10*3/uL (ref 0.7–4.0)
MCHC: 33.6 g/dL (ref 30.0–36.0)
MCV: 91 fl (ref 78.0–100.0)
Monocytes Absolute: 0.3 10*3/uL (ref 0.1–1.0)
Monocytes Relative: 8.5 % (ref 3.0–12.0)
Neutro Abs: 2.1 10*3/uL (ref 1.4–7.7)
Neutrophils Relative %: 57.7 % (ref 43.0–77.0)
Platelets: 274 10*3/uL (ref 150.0–400.0)
RBC: 4.69 Mil/uL (ref 3.87–5.11)
RDW: 12.8 % (ref 11.5–14.6)
WBC: 3.6 10*3/uL — ABNORMAL LOW (ref 4.5–10.5)

## 2012-03-10 LAB — BASIC METABOLIC PANEL
BUN: 16 mg/dL (ref 6–23)
CO2: 33 mEq/L — ABNORMAL HIGH (ref 19–32)
Calcium: 9.3 mg/dL (ref 8.4–10.5)
Chloride: 99 mEq/L (ref 96–112)
Creatinine, Ser: 0.6 mg/dL (ref 0.4–1.2)
GFR: 113.19 mL/min (ref >60.00)
Glucose, Bld: 105 mg/dL — ABNORMAL HIGH (ref 70–99)
Potassium: 4.3 mEq/L (ref 3.5–5.1)
Sodium: 138 mEq/L (ref 135–145)

## 2012-03-10 LAB — URINALYSIS, ROUTINE W REFLEX MICROSCOPIC
Bilirubin Urine: NEGATIVE
Hgb urine dipstick: NEGATIVE
Ketones, ur: NEGATIVE
Leukocytes, UA: NEGATIVE
Nitrite: NEGATIVE
Specific Gravity, Urine: 1.015 (ref 1.000–1.030)
Total Protein, Urine: NEGATIVE
Urine Glucose: NEGATIVE
Urobilinogen, UA: 0.2 (ref 0.0–1.0)
pH: 7 (ref 5.0–8.0)

## 2012-03-10 LAB — HEPATIC FUNCTION PANEL
ALT: 39 U/L — ABNORMAL HIGH (ref 0–35)
AST: 31 U/L (ref 0–37)
Albumin: 4.1 g/dL (ref 3.5–5.2)
Alkaline Phosphatase: 62 U/L (ref 39–117)
Bilirubin, Direct: 0 mg/dL (ref 0.0–0.3)
Total Bilirubin: 0.8 mg/dL (ref 0.3–1.2)
Total Protein: 7.3 g/dL (ref 6.0–8.3)

## 2012-03-10 LAB — TSH: TSH: 2.78 u[IU]/mL (ref 0.35–5.50)

## 2012-03-10 LAB — LDL CHOLESTEROL, DIRECT: Direct LDL: 200.9 mg/dL

## 2012-03-17 ENCOUNTER — Ambulatory Visit (INDEPENDENT_AMBULATORY_CARE_PROVIDER_SITE_OTHER): Payer: Managed Care, Other (non HMO) | Admitting: Internal Medicine

## 2012-03-17 ENCOUNTER — Encounter: Payer: Self-pay | Admitting: Internal Medicine

## 2012-03-17 VITALS — BP 132/72 | HR 85 | Temp 98.2°F | Ht 64.0 in | Wt 183.1 lb

## 2012-03-17 DIAGNOSIS — E785 Hyperlipidemia, unspecified: Secondary | ICD-10-CM

## 2012-03-17 DIAGNOSIS — M810 Age-related osteoporosis without current pathological fracture: Secondary | ICD-10-CM

## 2012-03-17 DIAGNOSIS — Z0000 Encounter for general adult medical examination without abnormal findings: Secondary | ICD-10-CM

## 2012-03-17 MED ORDER — ATORVASTATIN CALCIUM 10 MG PO TABS: 10.0000 mg | ORAL_TABLET | Freq: Every day | ORAL | Status: DC

## 2012-03-17 MED ORDER — LANSOPRAZOLE 15 MG PO CPDR: 15.0000 mg | DELAYED_RELEASE_CAPSULE | Freq: Every day | ORAL | Status: DC

## 2012-03-17 MED ORDER — VALSARTAN-HYDROCHLOROTHIAZIDE 160-12.5 MG PO TABS: 1.0000 | ORAL_TABLET | Freq: Every day | ORAL | Status: DC

## 2012-03-17 NOTE — Assessment & Plan Note (Signed)
Sees GYN for this, gave results to pt

## 2012-03-17 NOTE — Progress Notes (Signed)
Subjective:    Patient ID: Heather Merritt, female    DOB: 09-Oct-1955, 56 y.o.   MRN: [id number]  HPI  Here for wellness and f/u;  Overall doing ok;  Pt denies CP, worsening SOB, DOE, wheezing, orthopnea, PND, worsening LE edema, palpitations, dizziness or syncope.  Pt denies neurological change such as new Headache, facial or extremity weakness.  Pt denies polydipsia, polyuria, or low sugar symptoms. Pt states overall good compliance with treatment and medications, good tolerability, and trying to follow lower cholesterol diet.  Pt denies worsening depressive symptoms, suicidal ideation or panic. No fever, wt loss, night sweats, loss of appetite, or other constitutional symptoms.  Pt states good ability with ADL's, low fall risk, home safety reviewed and adequate, no significant changes in hearing or vision, and occasionally active with exercise.  S/p cervical surgury with some dysphagia/vomiting post op for 3wks, improved. Willing to try low dose lipitor qod since this helped but has myalgias with mult statins in the past. No other acute complaints  Past Medical History  Diagnosis Date  . ANXIETY 10/28/2007  . DIVERTICULOSIS, COLON 10/28/2007  . HYPERLIPIDEMIA 10/28/2007    unable to take meds d/t allergies  . LIVER FUNCTION TESTS, ABNORMAL, HX OF 10/29/2007  . OSTEOPENIA 10/29/2007  . Presbyacusis 01/04/2010  . Irritable bowel syndrome (IBS)   . Migraine   . Obesity   . Vitamin D deficiency     takes Vit D daily  . Lumbar disc disease   . HYPERTENSION 10/28/2007    takes Diovan daily  . Seasonal allergies     takes Clarinex daily  . Vertigo     hx of  . Arthritis   . Joint pain   . Joint swelling   . Chronic back pain   . Osteoporosis   . Dry skin     red patch on right knee area  . Rosacea   . GERD 10/28/2007    takes Prevacid daily  . H/O hiatal hernia   . Internal hemorrhoids   . Hx of colonic polyps   . Cataracts, bilateral     immature   Past Surgical History  Procedure  Date  . Tonsillectomy   . Tonsillectomy at age 35  . Colonoscopy 2001/2013  . Esophagogastroduodenoscopy   . Anterior cervical decomp/discectomy fusion 08/02/2011    Procedure: ANTERIOR CERVICAL DECOMPRESSION/DISCECTOMY FUSION 3 LEVELS;  Surgeon: Mariam Dollar, MD;  Location: [facility];  Service: Neurosurgery;  Laterality: N/A;  Cervical Three-Four, Cervical Four-Five, Cervical Five-Six  Anterior Cervical Decompression Fusion     reports that she has quit smoking. She does not have any smokeless tobacco history on file. She reports that she drinks alcohol. She reports that she does not use illicit drugs. family history includes Atrial fibrillation in her father and mother; Hyperlipidemia in her mother; Hypothyroidism in her maternal grandmother and sister; and Stroke in her mother.  There is no history of Anesthesia problems, and Hypotension, and Malignant hyperthermia, and Pseudochol deficiency, . Allergies  Allergen Reactions  . Atorvastatin     REACTION: muscle weakness  . Penicillins     REACTION: rash  . Rosuvastatin     REACTION: muscle weakness  . Simvastatin     REACTION: muscle weakness  . Sulfonamide Derivatives     REACTION: nausea, abd pain   Current Outpatient Prescriptions on File Prior to Visit  Medication Sig Dispense Refill  . aspirin 81 MG tablet Take 81 mg by mouth daily.        Marland Kitchen  Calcium Citrate (CITRACAL PO) Take 2 tablets by mouth 2 (two) times daily.      . Cholecalciferol (VITAMIN D) 2000 UNITS CAPS Take 2,000 Units by mouth daily.      Marland Kitchen desloratadine (CLARINEX) 5 MG tablet Take 5 mg by mouth daily.      . fish oil-omega-3 fatty acids 1000 MG capsule Take 1 g by mouth 2 (two) times daily.      Marland Kitchen ibuprofen (ADVIL,MOTRIN) 200 MG tablet Take 600 mg by mouth every 6 (six) hours as needed. Pain      . metroNIDAZOLE (METROGEL) 0.75 % gel Apply 1 application topically at bedtime.      . Multiple Vitamin (MULITIVITAMIN WITH MINERALS) TABS Take 1 tablet by mouth daily.       . pimecrolimus (ELIDEL) 1 % cream Apply 1 application topically every morning.      . vitamin E 400 UNIT capsule Take 400 Units by mouth daily.      Marland Kitchen atorvastatin (LIPITOR) 10 MG tablet Take 1 tablet (10 mg total) by mouth daily.  90 tablet  3  . lansoprazole (PREVACID) 15 MG capsule Take 1 capsule (15 mg total) by mouth daily.  90 capsule  3  . promethazine (PHENERGAN) 12.5 MG tablet Take 1 tablet (12.5 mg total) by mouth every 6 (six) hours as needed for nausea.  30 tablet  0  . valsartan-hydrochlorothiazide (DIOVAN-HCT) 160-12.5 MG per tablet Take 1 tablet by mouth daily.  14 tablet  0   Review of Systems Review of Systems  Constitutional: Negative for diaphoresis, activity change, appetite change and unexpected weight change.  HENT: Negative for hearing loss, ear pain, facial swelling, mouth sores and neck stiffness.   Eyes: Negative for pain, redness and visual disturbance.  Respiratory: Negative for shortness of breath and wheezing.   Cardiovascular: Negative for chest pain and palpitations.  Gastrointestinal: Negative for diarrhea, blood in stool, abdominal distention and rectal pain.  Genitourinary: Negative for hematuria, flank pain and decreased urine volume.  Musculoskeletal: Negative for myalgias and joint swelling.  Skin: Negative for color change and wound.  Neurological: Negative for syncope and numbness.  Hematological: Negative for adenopathy.  Psychiatric/Behavioral: Negative for hallucinations, self-injury, decreased concentration and agitation.      Objective:   Physical Exam BP 132/72  Pulse 85  Temp 98.2 F (36.8 C) (Oral)  Ht 5\' 4"  (1.626 m)  Wt 183 lb 2 oz (83.065 kg)  BMI 31.43 kg/m2  SpO2 97% Physical Exam  VS noted Constitutional: Pt is oriented to person, place, and time. Appears well-developed and well-nourished. Heather Merritt HENT:  Head: Normocephalic and atraumatic.  Right Ear: External ear normal.  Left Ear: External ear normal.  Nose: Nose  normal.  Mouth/Throat: Oropharynx is clear and moist.  Eyes: Conjunctivae and EOM are normal. Pupils are equal, round, and reactive to light.  Neck: Normal range of motion. Neck supple. No JVD present. No tracheal deviation present.  Cardiovascular: Normal rate, regular rhythm, normal heart sounds and intact distal pulses.   Pulmonary/Chest: Effort normal and breath sounds normal.  Abdominal: Soft. Bowel sounds are normal. There is no tenderness.  Musculoskeletal: Normal range of motion. Exhibits no edema.  Lymphadenopathy:  Has no cervical adenopathy.  Neurological: Pt is alert and oriented to person, place, and time. Pt has normal reflexes. No cranial nerve deficit.  Skin: Skin is warm and dry. No rash noted.  Psychiatric:  Has  normal mood and affect. Behavior is normal.     Assessment &  Plan:

## 2012-03-17 NOTE — Patient Instructions (Addendum)
Take all new medications as prescribed - the lipitor Continue all other medications as before Your refills were done as reqeuested Please have the pharmacy call with any other refills you may need. Please continue your efforts at being more active, low cholesterol diet, and weight control. Thank you for enrolling in MyChart. Please follow the instructions below to securely access your online medical record. MyChart allows you to send messages to your doctor, view your test results, renew your prescriptions, schedule appointments, and more. To Log into MyChart, please go to [email or web address], and your Username is: [email or web address] Please return in 1 year for your yearly visit, or sooner if needed, with Lab testing done 3-5 days before

## 2012-03-17 NOTE — Assessment & Plan Note (Signed)
Overall doing well, age appropriate education and counseling updated, referrals for preventative services and immunizations addressed, dietary and smoking counseling addressed, most recent labs and ECG reviewed.  I have personally reviewed and have noted: 1) the patient's medical and social history 2) The pt's use of alcohol, tobacco, and illicit drugs 3) The patient's current medications and supplements 4) Functional ability including ADL's, fall risk, home safety risk, hearing and visual impairment 5) Diet and physical activities 6) Evidence for depression or mood disorder 7) The patient's height, weight, and BMI have been recorded in the chart I have made referrals, and provided counseling and education based on review of the above Declines flu shot

## 2012-03-19 ENCOUNTER — Telehealth: Payer: Self-pay | Admitting: *Deleted

## 2012-03-19 MED ORDER — VALSARTAN-HYDROCHLOROTHIAZIDE 160-12.5 MG PO TABS: 1.0000 | ORAL_TABLET | Freq: Every day | ORAL | Status: DC

## 2012-03-19 MED ORDER — LANSOPRAZOLE 15 MG PO CPDR: 15.0000 mg | DELAYED_RELEASE_CAPSULE | Freq: Every day | ORAL | Status: DC

## 2012-03-19 MED ORDER — ATORVASTATIN CALCIUM 10 MG PO TABS: 10.0000 mg | ORAL_TABLET | Freq: Every day | ORAL | Status: DC

## 2012-03-19 NOTE — Telephone Encounter (Signed)
Pt needs rx's refilled at [facility] resent to [facility], she states she checked with mail order company and they have not received them yet. Pt states that she is almost of Diovan as well. Rx's sent to [facility] and temporary supply of Diovan sent to [facility].

## 2012-03-22 ENCOUNTER — Encounter: Payer: Self-pay | Admitting: Internal Medicine

## 2012-03-22 NOTE — Assessment & Plan Note (Signed)
For lipitor as rx, cont low chol diet

## 2012-04-28 ENCOUNTER — Ambulatory Visit (INDEPENDENT_AMBULATORY_CARE_PROVIDER_SITE_OTHER): Payer: Exclusive Provider Organization | Admitting: Nurse Practitioner

## 2012-04-28 ENCOUNTER — Encounter (INDEPENDENT_AMBULATORY_CARE_PROVIDER_SITE_OTHER): Payer: Self-pay

## 2012-04-28 VITALS — BP 123/78 | HR 105 | Temp 101.7°F | Resp 18 | Ht 64.0 in | Wt 182.0 lb

## 2012-04-28 LAB — POCT INFLUENZA A/B
POCT Rapid Influenza A AG: NEGATIVE
POCT Rapid Influenza B AG: NEGATIVE

## 2012-04-28 MED ORDER — ONDANSETRON HCL 4 MG PO TABS
4.0000 mg | ORAL_TABLET | Freq: Three times a day (TID) | ORAL | Status: AC | PRN
Start: 2012-04-28 — End: 2013-04-28

## 2012-04-28 MED ORDER — DICYCLOMINE HCL 20 MG PO TABS
20.0000 mg | ORAL_TABLET | Freq: Three times a day (TID) | ORAL | Status: AC | PRN
Start: 2012-04-28 — End: 2013-04-28

## 2012-04-28 MED ORDER — ONDANSETRON HCL 4 MG PO TABS
4.0000 mg | ORAL_TABLET | Freq: Every day | ORAL | Status: DC | PRN
Start: 2012-04-28 — End: 2012-04-28

## 2012-04-28 NOTE — Patient Instructions (Signed)
Food Poisoning Or Viral Gastroenteritis (6Yr-Adult)  You have a stomach illness that is likely either food poisoning or viral gastroenteritis. Food poisoning occurs from1 to 24 hours after eating contaminated food and lasts up to 1 to 2 days. Viral gastroenteritis is commonly known as the "stomach flu." It may last up to a week. Symptoms of both illnesses may include vomiting, diarrhea, fever, and stomach cramping. Antibiotics are not an effective treatment for either problem, but simple home treatment can give relief.  Home Care:   If symptoms are severe, rest at home for the next 24 hours.   You may use acetaminophen (Tylenol) or ibuprofen (Motrin, Advil) to control fever, unless another medication was prescribed. [NOTE: If you have chronic liver or kidney disease or ever had a stomach ulcer or GI bleeding, talk with your doctor before using these medications. Do not give aspirin to anyone under 18 years of age who is ill with a fever.]   Avoid tobacco and alcohol consumption. These may worsen your symptoms.   If medicines for diarrhea or vomiting were prescribed, take these only as directed. Never take these without a healthcare provider's approval.  During the first12 to 24hours follow the diet below:   BEVERAGES: Sport drinks like Gatorade, soft drinks without caffeine; ginger ale, mineral water (plain or flavored), decaffeinated tea and coffee.   SOUPS: Clear broth, consomm and bouillon   DESSERTS: Plain gelatin (Jell-O), popsicles and fruit juice bars.  During the next 24 hours you may add the following to the above:   Hot cereal, plain toast, bread, rolls, crackers   Plain noodles, rice, mashed potatoes, chicken noodle or rice soup   Unsweetened canned fruit (avoid pineapple), bananas   Limit fat intake to less than 15 grams per day by avoiding margarine, butter, oils, mayonnaise, sauces, gravies, fried foods, peanut butter, meat, poultry, and fish.   Limit fiber; avoid raw or cooked  vegetables, fresh fruits (except bananas) and bran cereals.   Limit caffeine and chocolate. No spices or seasonings except salt.  Gradually resume a normal diet as you feel better and your symptoms lessen.  Follow Up  with your doctor as advised if you are not better in 2 days. If a stool (diarrhea) sample was taken, you may call in 2 days (or as directed) for the results.  Get Prompt Medical Attention  if any of the following occur:   Increasing abdominal pain or constant lower right abdominal pain   Continued vomiting (unable to keep liquids down)   Frequent diarrhea (more than 5 times a day)   Blood in vomit or stool (black or red color)   Signs of dehydration: increased thirst, dark urine, reduced or no urine output, dry mouth and tongue, tireness or weakness, dizziness when standing, rapid breathinng   New rash   Fever of 100.4F (38C) oral or higher, not better with fever medication   2000-2013 [employer], [address], [place] [address]. All rights reserved. This information is not intended as a substitute for professional medical care. Always follow your healthcare professional's instructions.

## 2012-04-28 NOTE — Progress Notes (Signed)
Subjective:       Patient ID: Olivia Ballard is a 57 y.o. female.    Fever   This is a new problem. The current episode started today. The problem occurs constantly. The problem has been unchanged. The maximum temperature noted was 100 to 100.9 F. Associated symptoms include diarrhea and nausea. Pertinent negatives include no congestion, coughing, sore throat or vomiting. She has tried nothing for the symptoms. The treatment provided no relief.     Chief Complaint   Patient presents with   . Fever     fever, chills, nausea x 4 am this morning.  No OTC meds taken for symptoms.     Did take her sisters potassium supplement today since she had so much diarrhea. She is not vomiting. She did eat out and is not sure that this is related. She does have an allergy sulfites.     The following portions of the patient's history were reviewed and updated as appropriate: allergies, current medications, past family history, past medical history, past social history, past surgical history and problem list.    Review of Systems   Constitutional: Positive for fever and fatigue.   HENT: Negative for congestion and sore throat.    Respiratory: Negative for cough.    Gastrointestinal: Positive for nausea and diarrhea. Negative for vomiting and blood in stool.   All other systems reviewed and are negative.          Objective:    Physical Exam   Constitutional: She is oriented to person, place, and time. She appears well-developed and well-nourished. No distress.   Cardiovascular: Normal rate, regular rhythm and normal heart sounds.    Pulmonary/Chest: Effort normal and breath sounds normal.   Abdominal: Soft. Bowel sounds are normal. She exhibits no distension. There is no tenderness.   Neurological: She is alert and oriented to person, place, and time.   Skin: Skin is warm and dry. She is not diaphoretic.   Psychiatric: She has a normal mood and affect.         Assessment:       1. Gastroenteritis  Stool Culture with Ova and Parasites    2. Fever  Influenza A/B         Plan:       Stool culture  Zofran PRN  Bentyl PRN  Push PO fluids  Follow up as needed

## 2012-04-28 NOTE — Addendum Note (Signed)
Addended by: Nickie Retort on: 04/28/2012 07:43 PM     Modules accepted: Orders

## 2012-04-30 LAB — OVA AND PARASITE EXAMINATION

## 2012-05-01 ENCOUNTER — Ambulatory Visit (INDEPENDENT_AMBULATORY_CARE_PROVIDER_SITE_OTHER): Payer: Managed Care, Other (non HMO) | Admitting: Internal Medicine

## 2012-05-01 ENCOUNTER — Encounter: Payer: Self-pay | Admitting: Internal Medicine

## 2012-05-01 ENCOUNTER — Telehealth (INDEPENDENT_AMBULATORY_CARE_PROVIDER_SITE_OTHER): Payer: Self-pay

## 2012-05-01 VITALS — BP 120/80 | HR 71 | Temp 98.1°F | Ht 64.0 in | Wt 179.5 lb

## 2012-05-01 DIAGNOSIS — J019 Acute sinusitis, unspecified: Secondary | ICD-10-CM

## 2012-05-01 DIAGNOSIS — F411 Generalized anxiety disorder: Secondary | ICD-10-CM

## 2012-05-01 DIAGNOSIS — I10 Essential (primary) hypertension: Secondary | ICD-10-CM

## 2012-05-01 MED ORDER — AZITHROMYCIN 250 MG PO TABS: ORAL_TABLET | ORAL | Status: DC

## 2012-05-01 NOTE — Assessment & Plan Note (Signed)
stable overall by history and exam, recent data reviewed with pt, and pt to continue medical treatment as before,  to f/u any worsening symptoms or concerns, doubt relates to the positional HA BP Readings from Last 3 Encounters:  05/01/12 120/80  03/17/12 132/72  08/03/11 97/61

## 2012-05-01 NOTE — Assessment & Plan Note (Signed)
Mild to mod, for antibx course,  to f/u any worsening symptoms or concerns 

## 2012-05-01 NOTE — Assessment & Plan Note (Signed)
stable overall by history and exam, recent data reviewed with pt, and pt to continue medical treatment as before,  to f/u any worsening symptoms or concerns Lab Results  Component Value Date   WBC 3.6* 03/10/2012   HGB 14.4 03/10/2012   HCT 42.7 03/10/2012   PLT 274.0 03/10/2012   GLUCOSE 105* 03/10/2012   CHOL 293* 03/10/2012   TRIG 334.0* 03/10/2012   HDL 34.50* 03/10/2012   LDLDIRECT 200.9 03/10/2012   ALT 39* 03/10/2012   AST 31 03/10/2012   NA 138 03/10/2012   K 4.3 03/10/2012   CL 99 03/10/2012   CREATININE 0.6 03/10/2012   BUN 16 03/10/2012   CO2 33* 03/10/2012   TSH 2.78 03/10/2012

## 2012-05-01 NOTE — Progress Notes (Signed)
Subjective:    Patient ID: Heather Merritt, female    DOB: 1955-06-20, 57 y.o.   MRN: [id number]  HPI  Here to f/u after seen at [facility] in [place] on Mon jan 27, brings results of testing for influ A and B (both neg), and stool testing apparently still pending;  Symptoms apparently flu like with feeling warm, malaise, myalgias, n/v and watery diarrhea, all of which has improved except for worsening 2-3 days acute onset facial pain, pressure, postional headache (worse to stand, worse at the end of the day), general weakness and malaise, and greenish d/c, but pt denies chest pain, wheezing, increased sob or doe, orthopnea, PND, increased LE swelling, palpitations, dizziness or syncope.Pt denies new neurological symptoms such as new headache, or facial or extremity weakness or numbness   Pt denies polydipsia, polyuria.  Denies worsening depressive symptoms, suicidal ideation, or panic Past Medical History  Diagnosis Date  . ANXIETY 10/28/2007  . DIVERTICULOSIS, COLON 10/28/2007  . HYPERLIPIDEMIA 10/28/2007    unable to take meds d/t allergies  . LIVER FUNCTION TESTS, ABNORMAL, HX OF 10/29/2007  . OSTEOPENIA 10/29/2007  . Presbyacusis 01/04/2010  . Irritable bowel syndrome (IBS)   . Migraine   . Obesity   . Vitamin D deficiency     takes Vit D daily  . Lumbar disc disease   . HYPERTENSION 10/28/2007    takes Diovan daily  . Seasonal allergies     takes Clarinex daily  . Vertigo     hx of  . Arthritis   . Joint pain   . Joint swelling   . Chronic back pain   . Osteoporosis   . Dry skin     red patch on right knee area  . Rosacea   . GERD 10/28/2007    takes Prevacid daily  . H/O hiatal hernia   . Internal hemorrhoids   . Hx of colonic polyps   . Cataracts, bilateral     immature   Past Surgical History  Procedure Date  . Tonsillectomy   . Tonsillectomy at age 69  . Colonoscopy 2001/2013  . Esophagogastroduodenoscopy   . Anterior cervical decomp/discectomy fusion 08/02/2011    Procedure:  ANTERIOR CERVICAL DECOMPRESSION/DISCECTOMY FUSION 3 LEVELS;  Surgeon: Mariam Dollar, MD;  Location: [facility];  Service: Neurosurgery;  Laterality: N/A;  Cervical Three-Four, Cervical Four-Five, Cervical Five-Six  Anterior Cervical Decompression Fusion     reports that she has quit smoking. She does not have any smokeless tobacco history on file. She reports that she drinks alcohol. She reports that she does not use illicit drugs. family history includes Atrial fibrillation in her father and mother; Hyperlipidemia in her mother; Hypothyroidism in her maternal grandmother and sister; and Stroke in her mother.  There is no history of Anesthesia problems, and Hypotension, and Malignant hyperthermia, and Pseudochol deficiency, . Allergies  Allergen Reactions  . Atorvastatin     REACTION: muscle weakness  . Penicillins     REACTION: rash  . Rosuvastatin     REACTION: muscle weakness  . Simvastatin     REACTION: muscle weakness  . Sulfonamide Derivatives     REACTION: nausea, abd pain   Current Outpatient Prescriptions on File Prior to Visit  Medication Sig Dispense Refill  . aspirin 81 MG tablet Take 81 mg by mouth daily.        Marland Kitchen atorvastatin (LIPITOR) 10 MG tablet Take 1 tablet (10 mg total) by mouth daily.  90 tablet  3  .  Calcium Citrate (CITRACAL PO) Take 2 tablets by mouth 2 (two) times daily.      . Cholecalciferol (VITAMIN D) 2000 UNITS CAPS Take 2,000 Units by mouth daily.      Marland Kitchen desloratadine (CLARINEX) 5 MG tablet Take 5 mg by mouth daily.      . fish oil-omega-3 fatty acids 1000 MG capsule Take 1 g by mouth 2 (two) times daily.      Marland Kitchen ibuprofen (ADVIL,MOTRIN) 200 MG tablet Take 600 mg by mouth every 6 (six) hours as needed. Pain      . lansoprazole (PREVACID) 15 MG capsule Take 1 capsule (15 mg total) by mouth daily.  90 capsule  3  . metroNIDAZOLE (METROGEL) 0.75 % gel Apply 1 application topically at bedtime.      . Multiple Vitamin (MULITIVITAMIN WITH MINERALS) TABS Take 1  tablet by mouth daily.      . pimecrolimus (ELIDEL) 1 % cream Apply 1 application topically every morning.      . valsartan-hydrochlorothiazide (DIOVAN-HCT) 160-12.5 MG per tablet Take 1 tablet by mouth daily.  14 tablet  0  . vitamin E 400 UNIT capsule Take 400 Units by mouth daily.      . promethazine (PHENERGAN) 12.5 MG tablet Take 1 tablet (12.5 mg total) by mouth every 6 (six) hours as needed for nausea.  30 tablet  0   Review of Systems  Constitutional: Negative for unexpected weight change, or unusual diaphoresis  HENT: Negative for tinnitus.   Eyes: Negative for photophobia and visual disturbance.  Respiratory: Negative for choking and stridor.   Gastrointestinal: Negative for vomiting and blood in stool.  Genitourinary: Negative for hematuria and decreased urine volume.  Musculoskeletal: Negative for acute joint swelling Skin: Negative for color change and wound.  Neurological: Negative for tremors and numbness other than noted  Psychiatric/Behavioral: Negative for decreased concentration or  hyperactivity.       Objective:   Physical Exam BP 120/80  Pulse 71  Temp 98.1 F (36.7 C) (Oral)  Ht 5\' 4"  (1.626 m)  Wt 179 lb 8 oz (81.421 kg)  BMI 30.81 kg/m2  SpO2 95% VS noted,  Constitutional: Pt appears well-developed and well-nourished.  HENT: Head: NCAT.  Right Ear: External ear normal.  Left Ear: External ear normal.  Bilat tm's with mild erythema.  Max sinus areas mild tender.  Pharynx with mild erythema, no exudate Eyes: Conjunctivae and EOM are normal. Pupils are equal, round, and reactive to light.  Neck: Normal range of motion. Neck supple.  Cardiovascular: Normal rate and regular rhythm.   Pulmonary/Chest: Effort normal and breath sounds normal.  Abd:  Soft, NT, non-distended, + BS Neurological: Pt is alert. Not confused  Skin: Skin is warm. No erythema. No rash Psychiatric: Pt behavior is normal. Thought content normal. mild nervous, not depressed affect     Assessment & Plan:

## 2012-05-01 NOTE — Patient Instructions (Addendum)
Please take all new medication as prescribed Please continue all other medications as before, and refills have been done if requested. Please drink plenty of fluids and remember the BRAT diet Please have the pharmacy call with any other refills you may need, including the zofran Thank you for enrolling in MyChart. Please follow the instructions below to securely access your online medical record. MyChart allows you to send messages to your doctor, view your test results, renew your prescriptions, schedule appointments, and more.

## 2012-05-02 ENCOUNTER — Telehealth (INDEPENDENT_AMBULATORY_CARE_PROVIDER_SITE_OTHER): Payer: Self-pay

## 2012-05-02 LAB — CULTURE, STOOL, SALM/SHIGELLA/CAMPY/EHEC: E coli, Shiga toxin Assay: NEGATIVE

## 2012-05-17 ENCOUNTER — Other Ambulatory Visit: Payer: Self-pay

## 2012-05-27 ENCOUNTER — Other Ambulatory Visit: Payer: Self-pay | Admitting: Internal Medicine

## 2012-12-22 ENCOUNTER — Other Ambulatory Visit: Payer: Self-pay

## 2012-12-22 DIAGNOSIS — Z1231 Encounter for screening mammogram for malignant neoplasm of breast: Secondary | ICD-10-CM

## 2013-01-02 ENCOUNTER — Other Ambulatory Visit: Payer: Self-pay | Admitting: Neurosurgery

## 2013-01-02 DIAGNOSIS — M545 Low back pain, unspecified: Secondary | ICD-10-CM

## 2013-01-13 ENCOUNTER — Ambulatory Visit
Admission: RE | Admit: 2013-01-13 | Discharge: 2013-01-13 | Disposition: A | Discharge status: Home / Self Care | Payer: Managed Care, Other (non HMO) | Source: Ambulatory Visit | Attending: Neurosurgery | Admitting: Neurosurgery

## 2013-01-13 DIAGNOSIS — M545 Low back pain, unspecified: Secondary | ICD-10-CM

## 2013-02-02 ENCOUNTER — Telehealth: Payer: Self-pay | Admitting: Internal Medicine

## 2013-02-02 ENCOUNTER — Ambulatory Visit
Admission: RE | Admit: 2013-02-02 | Discharge: 2013-02-02 | Disposition: A | Discharge status: Home / Self Care | Payer: Managed Care, Other (non HMO) | Source: Ambulatory Visit

## 2013-02-02 DIAGNOSIS — Z1231 Encounter for screening mammogram for malignant neoplasm of breast: Secondary | ICD-10-CM

## 2013-02-02 MED ORDER — VALSARTAN-HYDROCHLOROTHIAZIDE 160-12.5 MG PO TABS: 1.0000 | ORAL_TABLET | Freq: Every day | ORAL | Status: DC

## 2013-02-02 NOTE — Telephone Encounter (Signed)
02/02/2013  Pt is requesting a refill on maintenance med : valsartan-hydrochlorothiazide (DIOVAN-HCT) 160-12.5 MG per tablet.  Pt has a scheduled CPE for Janurary 2015, but is needing this med filled before then. Please advise.

## 2013-02-02 NOTE — Telephone Encounter (Signed)
Called pt verify which pharmacy need med to go to [facility]. Inform pt will send in 90 with no refills. Must see physical appt for additional refills...lmb

## 2013-02-05 ENCOUNTER — Other Ambulatory Visit: Payer: Self-pay

## 2013-02-19 LAB — HM MAMMOGRAPHY

## 2013-03-09 ENCOUNTER — Telehealth: Payer: Self-pay | Admitting: Internal Medicine

## 2013-03-09 NOTE — Telephone Encounter (Signed)
Patient Information:  Caller Name: Brittaney  Phone: [phone]  Patient: Heather Merritt  Gender: Female  DOB: 1955-10-20  Age: 57 Years  PCP: Oliver Barre (Adults only)  Office Follow Up:  Does the office need to follow up with this patient?: No  Instructions For The Office: N/A   Symptoms  Reason For Call & Symptoms: Pt is calling about abdominal pain - onset last evening at 6pm. 03/08/13.  Pain was intermittent and became constant over the past 3 hours. Pain is a 5/8. Pt states it is middle of the abdomen to the left side - low abdomen. No nausea/vomiting/diarrhea. Pt across town and could not make it to the office until > 5pm. RN advised [facility].  Reviewed Health History In EMR: Yes  Reviewed Medications In EMR: Yes  Reviewed Allergies In EMR: Yes  Reviewed Surgeries / Procedures: Yes  Date of Onset of Symptoms: 03/08/2013  Guideline(s) Used:  Abdominal Pain - Female  Disposition Per Guideline:   Go to ED Now (or to Office with PCP Approval)  Reason For Disposition Reached:   Constant abdominal pain lasting > 2 hours  Advice Given:  N/A  Patient Will Follow Care Advice:  YES

## 2013-03-10 ENCOUNTER — Ambulatory Visit (INDEPENDENT_AMBULATORY_CARE_PROVIDER_SITE_OTHER): Payer: Managed Care, Other (non HMO) | Admitting: Internal Medicine

## 2013-03-10 ENCOUNTER — Encounter: Payer: Self-pay | Admitting: Internal Medicine

## 2013-03-10 VITALS — BP 120/80 | HR 79 | Temp 99.3°F | Ht 63.5 in | Wt 184.8 lb

## 2013-03-10 DIAGNOSIS — N200 Calculus of kidney: Secondary | ICD-10-CM

## 2013-03-10 MED ORDER — KETOROLAC TROMETHAMINE 10 MG PO TABS: 10.0000 mg | ORAL_TABLET | Freq: Four times a day (QID) | ORAL | Status: DC | PRN

## 2013-03-10 NOTE — Patient Instructions (Signed)
Nephrolithiasis - positive hematuria and flank pain. The pain is better and the exam is non-acute. It is very likely that this stone may pass  Plan Hydrate  Ketorolac 10 mg q 8, limit 15 tabs  Phenergan for nausea as needed.  For fever, cloudy urine, refractory pain will need CT stone protocol.   Kidney Stones Kidney stones (urolithiasis) are deposits that form inside your kidneys. The intense pain is caused by the stone moving through the urinary tract. When the stone moves, the ureter goes into spasm around the stone. The stone is usually passed in the urine.  CAUSES   A disorder that makes certain neck glands produce too much parathyroid hormone (primary hyperparathyroidism).  A buildup of uric acid crystals, similar to gout in your joints.  Narrowing (stricture) of the ureter.  A kidney obstruction present at birth (congenital obstruction).  Previous surgery on the kidney or ureters.  Numerous kidney infections. SYMPTOMS   Feeling sick to your stomach (nauseous).  Throwing up (vomiting).  Blood in the urine (hematuria).  Pain that usually spreads (radiates) to the groin.  Frequency or urgency of urination. DIAGNOSIS   Taking a history and physical exam.  Blood or urine tests.  CT scan.  Occasionally, an examination of the inside of the urinary bladder (cystoscopy) is performed. TREATMENT   Observation.  Increasing your fluid intake.  Extracorporeal shock wave lithotripsy This is a noninvasive procedure that uses shock waves to break up kidney stones.  Surgery may be needed if you have severe pain or persistent obstruction. There are various surgical procedures. Most of the procedures are performed with the use of small instruments. Only small incisions are needed to accommodate these instruments, so recovery time is minimized. The size, location, and chemical composition are all important variables that will determine the proper choice of action for you. Talk  to your health care provider to better understand your situation so that you will minimize the risk of injury to yourself and your kidney.  HOME CARE INSTRUCTIONS   Drink enough water and fluids to keep your urine clear or pale yellow. This will help you to pass the stone or stone fragments.  Strain all urine through the provided strainer. Keep all particulate matter and stones for your health care provider to see. The stone causing the pain may be as small as a grain of salt. It is very important to use the strainer each and every time you pass your urine. The collection of your stone will allow your health care provider to analyze it and verify that a stone has actually passed. The stone analysis will often identify what you can do to reduce the incidence of recurrences.  Only take over-the-counter or prescription medicines for pain, discomfort, or fever as directed by your health care provider.  Make a follow-up appointment with your health care provider as directed.  Get follow-up X-rays if required. The absence of pain does not always mean that the stone has passed. It may have only stopped moving. If the urine remains completely obstructed, it can cause loss of kidney function or even complete destruction of the kidney. It is your responsibility to make sure X-rays and follow-ups are completed. Ultrasounds of the kidney can show blockages and the status of the kidney. Ultrasounds are not associated with any radiation and can be performed easily in a matter of minutes. SEEK MEDICAL CARE IF:  You experience pain that is progressive and unresponsive to any pain medicine you have been  prescribed. SEEK IMMEDIATE MEDICAL CARE IF:   Pain cannot be controlled with the prescribed medicine.  You have a fever or shaking chills.  The severity or intensity of pain increases over 18 hours and is not relieved by pain medicine.  You develop a new onset of abdominal pain.  You feel faint or pass  out.  You are unable to urinate. MAKE SURE YOU:   Understand these instructions.  Will watch your condition.  Will get help right away if you are not doing well or get worse. Document Released: 03/19/2005 Document Revised: 11/19/2012 Document Reviewed: 08/20/2012 [facility] Patient Information 2014 [place], [place].

## 2013-03-10 NOTE — Progress Notes (Signed)
Subjective:    Patient ID: Heather Merritt, female    DOB: Jan 01, 1956, 57 y.o.   MRN: [id number]  HPI Ms. Guylledge started having abdominal pain Saturday in the suprapubic area with radiation to the left flank. Was seen at [facility] - tentative dx - nephrolithiasis. U/A is pending - called report trace blood on dip.  Past Medical History  Diagnosis Date  . ANXIETY 10/28/2007  . DIVERTICULOSIS, COLON 10/28/2007  . HYPERLIPIDEMIA 10/28/2007    unable to take meds d/t allergies  . LIVER FUNCTION TESTS, ABNORMAL, HX OF 10/29/2007  . OSTEOPENIA 10/29/2007  . Presbyacusis 01/04/2010  . Irritable bowel syndrome (IBS)   . Migraine   . Obesity   . Vitamin D deficiency     takes Vit D daily  . Lumbar disc disease   . HYPERTENSION 10/28/2007    takes Diovan daily  . Seasonal allergies     takes Clarinex daily  . Vertigo     hx of  . Arthritis   . Joint pain   . Joint swelling   . Chronic back pain   . Osteoporosis   . Dry skin     red patch on right knee area  . Rosacea   . GERD 10/28/2007    takes Prevacid daily  . H/O hiatal hernia   . Internal hemorrhoids   . Hx of colonic polyps   . Cataracts, bilateral     immature   Past Surgical History  Procedure Laterality Date  . Tonsillectomy    . Tonsillectomy  at age 26  . Colonoscopy  2001/2013  . Esophagogastroduodenoscopy    . Anterior cervical decomp/discectomy fusion  08/02/2011    Procedure: ANTERIOR CERVICAL DECOMPRESSION/DISCECTOMY FUSION 3 LEVELS;  Surgeon: Mariam Dollar, MD;  Location: [facility];  Service: Neurosurgery;  Laterality: N/A;  Cervical Three-Four, Cervical Four-Five, Cervical Five-Six  Anterior Cervical Decompression Fusion    Family History  Problem Relation Age of Onset  . Atrial fibrillation Mother   . Hyperlipidemia Mother   . Stroke Mother   . Atrial fibrillation Father   . Hypothyroidism Sister   . Hypothyroidism Maternal Grandmother   . Anesthesia problems Neg Hx   . Hypotension Neg Hx     . Malignant hyperthermia Neg Hx   . Pseudochol deficiency Neg Hx    History   Social History  . Marital Status: Married    Spouse Name: N/A    Number of Children: N/A  . Years of Education: N/A   Occupational History  . Not on file.   Social History Main Topics  . Smoking status: Former Games developer  . Smokeless tobacco: Not on file     Comment: quit in 81yrs ago  . Alcohol Use: Yes     Comment: occ beer/drink  . Drug Use: No  . Sexual Activity: Yes    Birth Control/ Protection: Post-menopausal   Other Topics Concern  . Not on file   Social History Narrative  . No narrative on file    Current Outpatient Prescriptions on File Prior to Visit  Medication Sig Dispense Refill  . aspirin 81 MG tablet Take 81 mg by mouth daily.        Marland Kitchen atorvastatin (LIPITOR) 10 MG tablet Take 1 tablet (10 mg total) by mouth daily.  90 tablet  3  . Calcium Citrate (CITRACAL PO) Take 2 tablets by mouth 2 (two) times daily.      . Cholecalciferol (VITAMIN D) 2000  UNITS CAPS Take 2,000 Units by mouth daily.      Marland Kitchen desloratadine (CLARINEX) 5 MG tablet TAKE 1 TABLET DAILY  90 tablet  2  . fish oil-omega-3 fatty acids 1000 MG capsule Take 1 g by mouth 2 (two) times daily.      Marland Kitchen ibuprofen (ADVIL,MOTRIN) 200 MG tablet Take 600 mg by mouth every 6 (six) hours as needed. Pain      . lansoprazole (PREVACID) 15 MG capsule Take 1 capsule (15 mg total) by mouth daily.  90 capsule  3  . metroNIDAZOLE (METROGEL) 0.75 % gel Apply 1 application topically at bedtime.      . Multiple Vitamin (MULITIVITAMIN WITH MINERALS) TABS Take 1 tablet by mouth daily.      . pimecrolimus (ELIDEL) 1 % cream Apply 1 application topically every morning.      . valsartan-hydrochlorothiazide (DIOVAN-HCT) 160-12.5 MG per tablet Take 1 tablet by mouth daily.  90 tablet  0  . vitamin E 400 UNIT capsule Take 400 Units by mouth daily.      . promethazine (PHENERGAN) 12.5 MG tablet Take 1 tablet (12.5 mg total) by mouth every 6 (six) hours  as needed for nausea.  30 tablet  0   No current facility-administered medications on file prior to visit.      Review of Systems System review is negative for any constitutional, cardiac, pulmonary, GI or neuro symptoms or complaints other than as described in the HPI.     Objective:   Physical Exam Filed Vitals:   03/10/13 1504  BP: 120/80  Pulse: 79  Temp: 99.3 F (37.4 C)   Gen'l - WNWD woman in no distress Cor RRR Pulm - normal respirations Abd - left flank tender to percussion, abdomen at left-level of umbilicus tender to percussion and palpation, suprapubic non-tender.       Assessment & Plan:  Nephrolithiasis - positive hematuria and flank pain. The pain is better and the exam is non-acute. It is very likely that this stone may pass  Plan Hydrate  Ketorolac 10 mg q 8, limit 15 tabs  Phenergan for nausea as needed.  For fever, cloudy urine, refractory pain will need CT stone protocol.

## 2013-03-10 NOTE — Progress Notes (Signed)
Pre visit review using our clinic review tool, if applicable. No additional management support is needed unless otherwise documented below in the visit note. 

## 2013-03-18 ENCOUNTER — Encounter: Payer: Managed Care, Other (non HMO) | Admitting: Internal Medicine

## 2013-04-07 ENCOUNTER — Other Ambulatory Visit (INDEPENDENT_AMBULATORY_CARE_PROVIDER_SITE_OTHER): Payer: Managed Care, Other (non HMO)

## 2013-04-07 DIAGNOSIS — M5417 Radiculopathy, lumbosacral region: Secondary | ICD-10-CM | POA: Insufficient documentation

## 2013-04-07 DIAGNOSIS — Z0000 Encounter for general adult medical examination without abnormal findings: Secondary | ICD-10-CM

## 2013-04-07 LAB — URINALYSIS, ROUTINE W REFLEX MICROSCOPIC
Bilirubin Urine: NEGATIVE
Ketones, ur: NEGATIVE
Leukocytes, UA: NEGATIVE
Nitrite: NEGATIVE
Specific Gravity, Urine: 1.02 (ref 1.000–1.030)
Total Protein, Urine: NEGATIVE
Urine Glucose: NEGATIVE
Urobilinogen, UA: 0.2 (ref 0.0–1.0)
pH: 6 (ref 5.0–8.0)

## 2013-04-07 LAB — HEPATIC FUNCTION PANEL
ALT: 44 U/L — ABNORMAL HIGH (ref 0–35)
AST: 31 U/L (ref 0–37)
Albumin: 4.1 g/dL (ref 3.5–5.2)
Alkaline Phosphatase: 47 U/L (ref 39–117)
Bilirubin, Direct: 0.1 mg/dL (ref 0.0–0.3)
Total Bilirubin: 0.7 mg/dL (ref 0.3–1.2)
Total Protein: 7.1 g/dL (ref 6.0–8.3)

## 2013-04-07 LAB — BASIC METABOLIC PANEL
BUN: 15 mg/dL (ref 6–23)
CO2: 31 mEq/L (ref 19–32)
Calcium: 9.2 mg/dL (ref 8.4–10.5)
Chloride: 101 mEq/L (ref 96–112)
Creatinine, Ser: 0.7 mg/dL (ref 0.4–1.2)
GFR: 90.77 mL/min (ref >60.00)
Glucose, Bld: 104 mg/dL — ABNORMAL HIGH (ref 70–99)
Potassium: 4.7 mEq/L (ref 3.5–5.1)
Sodium: 139 mEq/L (ref 135–145)

## 2013-04-07 LAB — CBC WITH DIFFERENTIAL/PLATELET
Basophils Absolute: 0 10*3/uL (ref 0.0–0.1)
Basophils Relative: 0.3 % (ref 0.0–3.0)
Eosinophils Absolute: 0.1 10*3/uL (ref 0.0–0.7)
Eosinophils Relative: 2 % (ref 0.0–5.0)
HCT: 40.1 % (ref 36.0–46.0)
Hemoglobin: 13.6 g/dL (ref 12.0–15.0)
Lymphocytes Relative: 33.2 % (ref 12.0–46.0)
Lymphs Abs: 1.6 10*3/uL (ref 0.7–4.0)
MCHC: 33.9 g/dL (ref 30.0–36.0)
MCV: 89.9 fl (ref 78.0–100.0)
Monocytes Absolute: 0.4 10*3/uL (ref 0.1–1.0)
Monocytes Relative: 7.8 % (ref 3.0–12.0)
Neutro Abs: 2.7 10*3/uL (ref 1.4–7.7)
Neutrophils Relative %: 56.7 % (ref 43.0–77.0)
Platelets: 298 10*3/uL (ref 150.0–400.0)
RBC: 4.46 Mil/uL (ref 3.87–5.11)
RDW: 12.5 % (ref 11.5–14.6)
WBC: 4.8 10*3/uL (ref 4.5–10.5)

## 2013-04-07 LAB — TSH: TSH: 1.73 u[IU]/mL (ref 0.35–5.50)

## 2013-04-07 LAB — LIPID PANEL
Cholesterol: 271 mg/dL — ABNORMAL HIGH (ref 0–200)
HDL: 30.2 mg/dL — ABNORMAL LOW (ref >39.00)
Total CHOL/HDL Ratio: 9
Triglycerides: 147 mg/dL (ref 0.0–149.0)
VLDL: 29.4 mg/dL (ref 0.0–40.0)

## 2013-04-07 LAB — LDL CHOLESTEROL, DIRECT: Direct LDL: 210.2 mg/dL

## 2013-04-14 ENCOUNTER — Ambulatory Visit (INDEPENDENT_AMBULATORY_CARE_PROVIDER_SITE_OTHER): Payer: Managed Care, Other (non HMO) | Admitting: Internal Medicine

## 2013-04-14 ENCOUNTER — Encounter: Payer: Self-pay | Admitting: Internal Medicine

## 2013-04-14 VITALS — BP 140/90 | HR 73 | Temp 98.2°F | Ht 63.5 in | Wt 182.4 lb

## 2013-04-14 DIAGNOSIS — Z0000 Encounter for general adult medical examination without abnormal findings: Secondary | ICD-10-CM

## 2013-04-14 DIAGNOSIS — E785 Hyperlipidemia, unspecified: Secondary | ICD-10-CM

## 2013-04-14 DIAGNOSIS — Z23 Encounter for immunization: Secondary | ICD-10-CM

## 2013-04-14 MED ORDER — LANSOPRAZOLE 15 MG PO CPDR: 15.0000 mg | DELAYED_RELEASE_CAPSULE | Freq: Every day | ORAL | Status: DC

## 2013-04-14 MED ORDER — VALSARTAN-HYDROCHLOROTHIAZIDE 160-12.5 MG PO TABS: 1.0000 | ORAL_TABLET | Freq: Every day | ORAL | Status: DC

## 2013-04-14 MED ORDER — ATORVASTATIN CALCIUM 10 MG PO TABS: 10.0000 mg | ORAL_TABLET | Freq: Every day | ORAL | Status: DC

## 2013-04-14 MED ORDER — DESLORATADINE 5 MG PO TABS: 5.0000 mg | ORAL_TABLET | Freq: Every day | ORAL | Status: DC

## 2013-04-14 NOTE — Progress Notes (Signed)
Subjective:    Patient ID: Heather Merritt, female    DOB: [birth year], 58 y.o.   MRN: [id number]  HPI  Here for wellness and f/u;  Overall doing ok;  Pt denies CP, worsening SOB, DOE, wheezing, orthopnea, PND, worsening LE edema, palpitations, dizziness or syncope.  Pt denies neurological change such as new headache, facial or extremity weakness.  Pt denies polydipsia, polyuria, or low sugar symptoms. Pt states overall good compliance with treatment and medications, good tolerability, and has been trying to follow lower cholesterol diet.  Pt denies worsening depressive symptoms, suicidal ideation or panic. No fever, night sweats, wt loss, loss of appetite, or other constitutional symptoms.  Pt states good ability with ADL's, has low fall risk, home safety reviewed and adequate, no other significant changes in hearing or vision, and only occasionally active with exercise.  Did have episode of lower abd pain, ? Passed urinary stone, had some blood on UA dip at the clinic, no recurrent pain since then.  No UTI.  B at home usually < 140/90 Past Medical History  Diagnosis Date  . ANXIETY 10/28/2007  . DIVERTICULOSIS, COLON 10/28/2007  . HYPERLIPIDEMIA 10/28/2007    unable to take meds d/t allergies  . LIVER FUNCTION TESTS, ABNORMAL, HX OF 10/29/2007  . OSTEOPENIA 10/29/2007  . Presbyacusis 01/04/2010  . Irritable bowel syndrome (IBS)   . Migraine   . Obesity   . Vitamin D deficiency     takes Vit D daily  . Lumbar disc disease   . HYPERTENSION 10/28/2007    takes Diovan daily  . Seasonal allergies     takes Clarinex daily  . Vertigo     hx of  . Arthritis   . Joint pain   . Joint swelling   . Chronic back pain   . Osteoporosis   . Dry skin     red patch on right knee area  . Rosacea   . GERD 10/28/2007    takes Prevacid daily  . H/O hiatal hernia   . Internal hemorrhoids   . Hx of colonic polyps   . Cataracts, bilateral     immature   Past Surgical History  Procedure Laterality Date    . Tonsillectomy    . Tonsillectomy  at age 43  . Colonoscopy  2001/2013  . Esophagogastroduodenoscopy    . Anterior cervical decomp/discectomy fusion  08/02/2011    Procedure: ANTERIOR CERVICAL DECOMPRESSION/DISCECTOMY FUSION 3 LEVELS;  Surgeon: Elaina Hoops, MD;  Location: [facility];  Service: Neurosurgery;  Laterality: N/A;  Cervical Three-Four, Cervical Four-Five, Cervical Five-Six  Anterior Cervical Decompression Fusion     reports that she has quit smoking. She does not have any smokeless tobacco history on file. She reports that she drinks alcohol. She reports that she does not use illicit drugs. family history includes Atrial fibrillation in her father and mother; Hyperlipidemia in her mother; Hypothyroidism in her maternal grandmother and sister; Stroke in her mother. There is no history of Anesthesia problems, Hypotension, Malignant hyperthermia, or Pseudochol deficiency. Allergies  Allergen Reactions  . Atorvastatin     REACTION: muscle weakness  . Penicillins     REACTION: rash  . Rosuvastatin     REACTION: muscle weakness  . Simvastatin     REACTION: muscle weakness  . Sulfonamide Derivatives     REACTION: nausea, abd pain   Current Outpatient Prescriptions on File Prior to Visit  Medication Sig Dispense Refill  . aspirin 81 MG tablet Take  81 mg by mouth daily.        Marland Kitchen atorvastatin (LIPITOR) 10 MG tablet Take 1 tablet (10 mg total) by mouth daily.  90 tablet  3  . Calcium Citrate (CITRACAL PO) Take 2 tablets by mouth 2 (two) times daily.      . Cholecalciferol (VITAMIN D) 2000 UNITS CAPS Take 2,000 Units by mouth daily.      . fish oil-omega-3 fatty acids 1000 MG capsule Take 1 g by mouth 2 (two) times daily.      Marland Kitchen ibuprofen (ADVIL,MOTRIN) 200 MG tablet Take 600 mg by mouth every 6 (six) hours as needed. Pain      . metroNIDAZOLE (METROGEL) 0.75 % gel Apply 1 application topically at bedtime.      . Multiple Vitamin (MULITIVITAMIN WITH MINERALS) TABS Take 1 tablet by  mouth daily.      . pimecrolimus (ELIDEL) 1 % cream Apply 1 application topically every morning.      . vitamin E 400 UNIT capsule Take 400 Units by mouth daily.       No current facility-administered medications on file prior to visit.   Review of Systems Constitutional: Negative for diaphoresis, activity change, appetite change or unexpected weight change.  HENT: Negative for hearing loss, ear pain, facial swelling, mouth sores and neck stiffness.   Eyes: Negative for pain, redness and visual disturbance.  Respiratory: Negative for shortness of breath and wheezing.   Cardiovascular: Negative for chest pain and palpitations.  Gastrointestinal: Negative for diarrhea, blood in stool, abdominal distention or other pain Genitourinary: Negative for hematuria, flank pain or change in urine volume.  Musculoskeletal: Negative for myalgias and joint swelling.  Skin: Negative for color change and wound.  Neurological: Negative for syncope and numbness. other than noted Hematological: Negative for adenopathy.  Psychiatric/Behavioral: Negative for hallucinations, self-injury, decreased concentration and agitation.      Objective:   Physical Exam BP 140/90  Pulse 73  Temp(Src) 98.2 F (36.8 C) (Oral)  Ht 5' 3.5" (1.613 m)  Wt 182 lb 6 oz (82.725 kg)  BMI 31.80 kg/m2  SpO2 97% VS noted,  Constitutional: Pt is oriented to person, place, and time. Appears well-developed and well-nourished.  Head: Normocephalic and atraumatic.  Right Ear: External ear normal.  Left Ear: External ear normal.  Nose: Nose normal.  Mouth/Throat: Oropharynx is clear and moist.  Eyes: Conjunctivae and EOM are normal. Pupils are equal, round, and reactive to light.  Neck: Normal range of motion. Neck supple. No JVD present. No tracheal deviation present.  Cardiovascular: Normal rate, regular rhythm, normal heart sounds and intact distal pulses.   Pulmonary/Chest: Effort normal and breath sounds normal.  Abdominal:  Soft. Bowel sounds are normal. There is no tenderness. No HSM  Musculoskeletal: Normal range of motion. Exhibits no edema.  Lymphadenopathy:  Has no cervical adenopathy.  Neurological: Pt is alert and oriented to person, place, and time. Pt has normal reflexes. No cranial nerve deficit.  Skin: Skin is warm and dry. No rash noted.  Psychiatric:  Has  normal mood and affect. Behavior is normal.     Assessment & Plan:

## 2013-04-14 NOTE — Assessment & Plan Note (Signed)
To start lipitor 10 qod as qd was not tolerated previously, as well as other statins

## 2013-04-14 NOTE — Patient Instructions (Addendum)
You had the flu shot today Please take all new medication as prescribed - the low dose lipitor Please continue all other medications as before, and refills have been done if requested. Please have the pharmacy call with any other refills you may need. Please continue your efforts at being more active, low cholesterol diet, and weight control. You are otherwise up to date with prevention measures today. Please keep your appointments with your specialists as you may have planned Your EKG and lab work was OK today Please look into whether the insurance covers the shingles shot.  Please return in 6 wks for lab work for your cholesterol  Please return in 1 year for your yearly visit, or sooner if needed, with Lab testing done 3-5 days before

## 2013-04-14 NOTE — Assessment & Plan Note (Signed)

## 2013-04-14 NOTE — Addendum Note (Signed)
Addended by: Sharon Seller B on: 04/14/2013 02:10 PM   Modules accepted: Orders

## 2013-04-14 NOTE — Addendum Note (Signed)
Addended by: Biagio Borg on: 04/14/2013 02:05 PM   Modules accepted: Orders

## 2013-08-20 ENCOUNTER — Other Ambulatory Visit: Payer: Self-pay | Admitting: Internal Medicine

## 2014-01-26 ENCOUNTER — Other Ambulatory Visit: Payer: Self-pay

## 2014-01-26 DIAGNOSIS — Z1231 Encounter for screening mammogram for malignant neoplasm of breast: Secondary | ICD-10-CM

## 2014-02-11 ENCOUNTER — Ambulatory Visit: Payer: Managed Care, Other (non HMO)

## 2014-03-02 ENCOUNTER — Ambulatory Visit
Admission: RE | Admit: 2014-03-02 | Discharge: 2014-03-02 | Disposition: A | Discharge status: Home / Self Care | Payer: Managed Care, Other (non HMO) | Source: Ambulatory Visit

## 2014-03-02 DIAGNOSIS — Z1231 Encounter for screening mammogram for malignant neoplasm of breast: Secondary | ICD-10-CM

## 2014-04-15 ENCOUNTER — Ambulatory Visit (INDEPENDENT_AMBULATORY_CARE_PROVIDER_SITE_OTHER): Payer: Managed Care, Other (non HMO) | Admitting: Internal Medicine

## 2014-04-15 ENCOUNTER — Encounter: Payer: Self-pay | Admitting: Internal Medicine

## 2014-04-15 VITALS — BP 132/72 | HR 67 | Temp 97.9°F | Ht 64.0 in | Wt 185.1 lb

## 2014-04-15 DIAGNOSIS — R079 Chest pain, unspecified: Secondary | ICD-10-CM | POA: Insufficient documentation

## 2014-04-15 DIAGNOSIS — K219 Gastro-esophageal reflux disease without esophagitis: Secondary | ICD-10-CM

## 2014-04-15 DIAGNOSIS — I10 Essential (primary) hypertension: Secondary | ICD-10-CM

## 2014-04-15 NOTE — Progress Notes (Signed)
Subjective:    Patient ID: Heather Merritt, female    DOB: 09/26/1955, 59 y.o.   MRN: [id number]  HPI  Here with 1 wk recurring sternal CP, dull burning pressure like, but pleuritic, and some tenderness noted to left parasternal and left costal margin, worse to bend forward to pick up something on floor, some radiation to the neck, without sour brash or dysphagia, Denies worsening reflux, abd pain, dysphagia, n/v, bowel change or blood. Not assoc with sob. N/v, diaphoresis, palp or dizziness. Past Medical History  Diagnosis Date  . ANXIETY 10/28/2007  . DIVERTICULOSIS, COLON 10/28/2007  . HYPERLIPIDEMIA 10/28/2007    unable to take meds d/t allergies  . LIVER FUNCTION TESTS, ABNORMAL, HX OF 10/29/2007  . OSTEOPENIA 10/29/2007  . Presbyacusis 01/04/2010  . Irritable bowel syndrome (IBS)   . Migraine   . Obesity   . Vitamin D deficiency     takes Vit D daily  . Lumbar disc disease   . HYPERTENSION 10/28/2007    takes Diovan daily  . Seasonal allergies     takes Clarinex daily  . Vertigo     hx of  . Arthritis   . Joint pain   . Joint swelling   . Chronic back pain   . Osteoporosis   . Dry skin     red patch on right knee area  . Rosacea   . GERD 10/28/2007    takes Prevacid daily  . H/O hiatal hernia   . Internal hemorrhoids   . Hx of colonic polyps   . Cataracts, bilateral     immature   Past Surgical History  Procedure Laterality Date  . Tonsillectomy    . Tonsillectomy  at age 45  . Colonoscopy  2001/2013  . Esophagogastroduodenoscopy    . Anterior cervical decomp/discectomy fusion  08/02/2011    Procedure: ANTERIOR CERVICAL DECOMPRESSION/DISCECTOMY FUSION 3 LEVELS;  Surgeon: Elaina Hoops, MD;  Location: [facility];  Service: Neurosurgery;  Laterality: N/A;  Cervical Three-Four, Cervical Four-Five, Cervical Five-Six  Anterior Cervical Decompression Fusion     reports that she has quit smoking. She does not have any smokeless tobacco history on file. She reports that she  drinks alcohol. She reports that she does not use illicit drugs. family history includes Atrial fibrillation in her father and mother; Hyperlipidemia in her mother; Hypothyroidism in her maternal grandmother and sister; Stroke in her mother. There is no history of Anesthesia problems, Hypotension, Malignant hyperthermia, or Pseudochol deficiency. Allergies  Allergen Reactions  . Atorvastatin     REACTION: muscle weakness  . Penicillins     REACTION: rash  . Rosuvastatin     REACTION: muscle weakness  . Simvastatin     REACTION: muscle weakness  . Sulfonamide Derivatives     REACTION: nausea, abd pain   Current Outpatient Prescriptions on File Prior to Visit  Medication Sig Dispense Refill  . aspirin 81 MG tablet Take 81 mg by mouth daily.      . Calcium Citrate (CITRACAL PO) Take 2 tablets by mouth 2 (two) times daily.    . Cholecalciferol (VITAMIN D) 2000 UNITS CAPS Take 2,000 Units by mouth daily.    Marland Kitchen desloratadine (CLARINEX) 5 MG tablet Take 1 tablet (5 mg total) by mouth daily. 90 tablet 3  . desloratadine (CLARINEX) 5 MG tablet TAKE 1 TABLET DAILY 90 tablet 1  . fish oil-omega-3 fatty acids 1000 MG capsule Take 1 g by mouth 2 (two) times daily.    Marland Kitchen  ibuprofen (ADVIL,MOTRIN) 200 MG tablet Take 600 mg by mouth every 6 (six) hours as needed. Pain    . metroNIDAZOLE (METROGEL) 0.75 % gel Apply 1 application topically at bedtime.    . Multiple Vitamin (MULITIVITAMIN WITH MINERALS) TABS Take 1 tablet by mouth daily.    . pimecrolimus (ELIDEL) 1 % cream Apply 1 application topically every morning.    . valsartan-hydrochlorothiazide (DIOVAN-HCT) 160-12.5 MG per tablet Take 1 tablet by mouth daily. 90 tablet 3  . vitamin E 400 UNIT capsule Take 400 Units by mouth daily.    . lansoprazole (PREVACID) 15 MG capsule Take 1 capsule (15 mg total) by mouth daily. 90 capsule 3   No current facility-administered medications on file prior to visit.   Review of Systems  Constitutional: Negative  for unusual diaphoresis or other sweats  HENT: Negative for ringing in ear Eyes: Negative for double vision or worsening visual disturbance.  Respiratory: Negative for choking and stridor.   Gastrointestinal: Negative for vomiting or other signifcant bowel change Genitourinary: Negative for hematuria or decreased urine volume.  Musculoskeletal: Negative for other MSK pain or swelling Skin: Negative for color change and worsening wound.  Neurological: Negative for tremors and numbness other than noted  Psychiatric/Behavioral: Negative for decreased concentration or agitation other than above       Objective:   Physical Exam BP 132/72 mmHg  Pulse 67  Temp(Src) 97.9 F (36.6 C) (Oral)  Ht 5\' 4"  (1.626 m)  Wt 185 lb 2 oz (83.972 kg)  BMI 31.76 kg/m2  SpO2 96% VS noted, not ill appearing Constitutional: Pt appears well-developed, well-nourished.  HENT: Head: NCAT.  Right Ear: External ear normal.  Left Ear: External ear normal.  Eyes: . Pupils are equal, round, and reactive to light. Conjunctivae and EOM are normal Neck: Normal range of motion. Neck supple.  Cardiovascular: Normal rate and regular rhythm.   Pulmonary/Chest: Effort normal and breath sounds without rales or wheezing.  + left parasternal tender, left costal margin tender Abd:  Soft, NT, ND, + BS Neurological: Pt is alert. Not confused , motor grossly intact Skin: Skin is warm. No rash Psychiatric: Pt behavior is normal. No agitation.     Assessment & Plan:

## 2014-04-15 NOTE — Progress Notes (Signed)
Pre visit review using our clinic review tool, if applicable. No additional management support is needed unless otherwise documented below in the visit note. 

## 2014-04-15 NOTE — Assessment & Plan Note (Addendum)
ECG reviewed as per emr, atypical, c/w msk most likely, for cxr as well - r/o pulm, also stress test given risk factors

## 2014-04-15 NOTE — Patient Instructions (Signed)
Your EKG is OK today  You will be contacted regarding the referral for: stress test  Please go to the XRAY Department in the Basement (go straight as you get off the elevator) for the x-ray testing tomorrow or "soon"  You will be contacted by phone if any changes need to be made immediately.  Otherwise, you will receive a letter about your results with an explanation, but please check with MyChart first.  Please continue all other medications as before  Please have the pharmacy call with any other refills you may need.   Please keep your appointments with your specialists as you may have planned

## 2014-04-16 ENCOUNTER — Ambulatory Visit (INDEPENDENT_AMBULATORY_CARE_PROVIDER_SITE_OTHER)
Admission: RE | Admit: 2014-04-16 | Discharge: 2014-04-16 | Disposition: A | Discharge status: Home / Self Care | Payer: Managed Care, Other (non HMO) | Source: Ambulatory Visit | Attending: Internal Medicine | Admitting: Internal Medicine

## 2014-04-16 DIAGNOSIS — R079 Chest pain, unspecified: Secondary | ICD-10-CM

## 2014-04-17 NOTE — Assessment & Plan Note (Signed)
stable overall by history and exam, recent data reviewed with pt, and pt to continue medical treatment as before,  to f/u any worsening symptoms or concerns BP Readings from Last 3 Encounters:  04/15/14 132/72  04/14/13 140/90  03/10/13 120/80

## 2014-04-17 NOTE — Assessment & Plan Note (Signed)
stable overall by history and exam, recent data reviewed with pt, and pt to continue medical treatment as before,  to f/u any worsening symptoms or concerns Lab Results  Component Value Date   WBC 4.8 04/07/2013   HGB 13.6 04/07/2013   HCT 40.1 04/07/2013   PLT 298.0 04/07/2013   GLUCOSE 104* 04/07/2013   CHOL 271* 04/07/2013   TRIG 147.0 04/07/2013   HDL 30.20* 04/07/2013   LDLDIRECT 210.2 04/07/2013   ALT 44* 04/07/2013   AST 31 04/07/2013   NA 139 04/07/2013   K 4.7 04/07/2013   CL 101 04/07/2013   CREATININE 0.7 04/07/2013   BUN 15 04/07/2013   CO2 31 04/07/2013   TSH 1.73 04/07/2013

## 2014-05-03 ENCOUNTER — Other Ambulatory Visit (INDEPENDENT_AMBULATORY_CARE_PROVIDER_SITE_OTHER): Payer: Managed Care, Other (non HMO)

## 2014-05-03 DIAGNOSIS — Z0000 Encounter for general adult medical examination without abnormal findings: Secondary | ICD-10-CM

## 2014-05-03 LAB — CBC WITH DIFFERENTIAL/PLATELET
Basophils Absolute: 0 10*3/uL (ref 0.0–0.1)
Basophils Relative: 0.4 % (ref 0.0–3.0)
Eosinophils Absolute: 0.1 10*3/uL (ref 0.0–0.7)
Eosinophils Relative: 1.7 % (ref 0.0–5.0)
HCT: 42.4 % (ref 36.0–46.0)
Hemoglobin: 14.6 g/dL (ref 12.0–15.0)
Lymphocytes Relative: 33.7 % (ref 12.0–46.0)
Lymphs Abs: 1.2 10*3/uL (ref 0.7–4.0)
MCHC: 34.4 g/dL (ref 30.0–36.0)
MCV: 87.8 fl (ref 78.0–100.0)
Monocytes Absolute: 0.3 10*3/uL (ref 0.1–1.0)
Monocytes Relative: 7.7 % (ref 3.0–12.0)
Neutro Abs: 2.1 10*3/uL (ref 1.4–7.7)
Neutrophils Relative %: 56.5 % (ref 43.0–77.0)
Platelets: 290 10*3/uL (ref 150.0–400.0)
RBC: 4.83 Mil/uL (ref 3.87–5.11)
RDW: 12.8 % (ref 11.5–15.5)
WBC: 3.7 10*3/uL — ABNORMAL LOW (ref 4.0–10.5)

## 2014-05-03 LAB — HEPATIC FUNCTION PANEL
ALT: 44 U/L — ABNORMAL HIGH (ref 0–35)
AST: 26 U/L (ref 0–37)
Albumin: 4.3 g/dL (ref 3.5–5.2)
Alkaline Phosphatase: 60 U/L (ref 39–117)
Bilirubin, Direct: 0.1 mg/dL (ref 0.0–0.3)
Total Bilirubin: 0.6 mg/dL (ref 0.2–1.2)
Total Protein: 7 g/dL (ref 6.0–8.3)

## 2014-05-03 LAB — URINALYSIS, ROUTINE W REFLEX MICROSCOPIC
Bilirubin Urine: NEGATIVE
Hgb urine dipstick: NEGATIVE
Ketones, ur: NEGATIVE
Leukocytes, UA: NEGATIVE
Nitrite: NEGATIVE
RBC / HPF: NONE SEEN (ref 0–?)
Specific Gravity, Urine: >=1.03 — AB (ref 1.000–1.030)
Total Protein, Urine: NEGATIVE
Urine Glucose: NEGATIVE
Urobilinogen, UA: 0.2 (ref 0.0–1.0)
pH: 5.5 (ref 5.0–8.0)

## 2014-05-03 LAB — LIPID PANEL
Cholesterol: 275 mg/dL — ABNORMAL HIGH (ref 0–200)
HDL: 37.1 mg/dL — ABNORMAL LOW (ref >39.00)
NonHDL: 237.9
Total CHOL/HDL Ratio: 7
Triglycerides: 299 mg/dL — ABNORMAL HIGH (ref 0.0–149.0)
VLDL: 59.8 mg/dL — ABNORMAL HIGH (ref 0.0–40.0)

## 2014-05-03 LAB — BASIC METABOLIC PANEL
BUN: 18 mg/dL (ref 6–23)
CO2: 30 mEq/L (ref 19–32)
Calcium: 9.5 mg/dL (ref 8.4–10.5)
Chloride: 102 mEq/L (ref 96–112)
Creatinine, Ser: 0.7 mg/dL (ref 0.40–1.20)
GFR: 90.45 mL/min (ref >60.00)
Glucose, Bld: 105 mg/dL — ABNORMAL HIGH (ref 70–99)
Potassium: 4.1 mEq/L (ref 3.5–5.1)
Sodium: 140 mEq/L (ref 135–145)

## 2014-05-03 LAB — LDL CHOLESTEROL, DIRECT: Direct LDL: 181 mg/dL

## 2014-05-03 LAB — TSH: TSH: 3.69 u[IU]/mL (ref 0.35–4.50)

## 2014-05-12 ENCOUNTER — Encounter: Payer: Self-pay | Admitting: Internal Medicine

## 2014-05-12 ENCOUNTER — Ambulatory Visit (INDEPENDENT_AMBULATORY_CARE_PROVIDER_SITE_OTHER): Payer: Managed Care, Other (non HMO) | Admitting: Internal Medicine

## 2014-05-12 VITALS — BP 152/94 | HR 66 | Temp 98.1°F | Ht 63.5 in | Wt 185.0 lb

## 2014-05-12 DIAGNOSIS — Z0000 Encounter for general adult medical examination without abnormal findings: Secondary | ICD-10-CM

## 2014-05-12 DIAGNOSIS — I10 Essential (primary) hypertension: Secondary | ICD-10-CM

## 2014-05-12 DIAGNOSIS — R079 Chest pain, unspecified: Secondary | ICD-10-CM

## 2014-05-12 MED ORDER — ATORVASTATIN CALCIUM 10 MG PO TABS: 10.0000 mg | ORAL_TABLET | ORAL | Status: DC

## 2014-05-12 MED ORDER — LANSOPRAZOLE 15 MG PO CPDR: 15.0000 mg | DELAYED_RELEASE_CAPSULE | Freq: Every day | ORAL | Status: DC

## 2014-05-12 MED ORDER — VALSARTAN-HYDROCHLOROTHIAZIDE 160-12.5 MG PO TABS
1.0000 | ORAL_TABLET | Freq: Every day | ORAL | Status: DC
Start: 2014-05-12 — End: 2014-05-14

## 2014-05-12 NOTE — Progress Notes (Signed)
Subjective:    Patient ID: Heather Merritt, female    DOB: [birth year], 59 y.o.   MRN: [id number]  HPI  Here for wellness and f/u;  Overall doing ok;  Pt denies, worsening SOB, DOE, wheezing, orthopnea, PND, worsening LE edema, palpitations, dizziness or syncope.  Pt denies neurological change such as new headache, facial or extremity weakness.  Pt denies polydipsia, polyuria, or low sugar symptoms. Pt states overall good compliance with treatment and medications, good tolerability, and has been trying to follow lower cholesterol diet.  Pt denies worsening depressive symptoms, suicidal ideation or panic. No fever, night sweats, wt loss, loss of appetite, or other constitutional symptoms.  Pt states good ability with ADL's, has low fall risk, home safety reviewed and adequate, no other significant changes in hearing or vision, and only occasionally active with exercise. BP at home < 140/90. Has not heard yet about stress test.  Left CP has resolved and not recurred.  Willing to re-try the lipitor at 10 mg qod  No other new complaints Past Medical History  Diagnosis Date  . ANXIETY 10/28/2007  . DIVERTICULOSIS, COLON 10/28/2007  . HYPERLIPIDEMIA 10/28/2007    unable to take meds d/t allergies  . LIVER FUNCTION TESTS, ABNORMAL, HX OF 10/29/2007  . OSTEOPENIA 10/29/2007  . Presbyacusis 01/04/2010  . Irritable bowel syndrome (IBS)   . Migraine   . Obesity   . Vitamin D deficiency     takes Vit D daily  . Lumbar disc disease   . HYPERTENSION 10/28/2007    takes Diovan daily  . Seasonal allergies     takes Clarinex daily  . Vertigo     hx of  . Arthritis   . Joint pain   . Joint swelling   . Chronic back pain   . Osteoporosis   . Dry skin     red patch on right knee area  . Rosacea   . GERD 10/28/2007    takes Prevacid daily  . H/O hiatal hernia   . Internal hemorrhoids   . Hx of colonic polyps   . Cataracts, bilateral     immature   Past Surgical History  Procedure Laterality Date  .  Tonsillectomy    . Tonsillectomy  at age 89  . Colonoscopy  2001/2013  . Esophagogastroduodenoscopy    . Anterior cervical decomp/discectomy fusion  08/02/2011    Procedure: ANTERIOR CERVICAL DECOMPRESSION/DISCECTOMY FUSION 3 LEVELS;  Surgeon: Elaina Hoops, MD;  Location: [facility];  Service: Neurosurgery;  Laterality: N/A;  Cervical Three-Four, Cervical Four-Five, Cervical Five-Six  Anterior Cervical Decompression Fusion     reports that she has quit smoking. She does not have any smokeless tobacco history on file. She reports that she drinks alcohol. She reports that she does not use illicit drugs. family history includes Atrial fibrillation in her father and mother; Hyperlipidemia in her mother; Hypothyroidism in her maternal grandmother and sister; Stroke in her mother. There is no history of Anesthesia problems, Hypotension, Malignant hyperthermia, or Pseudochol deficiency. Allergies  Allergen Reactions  . Penicillins     REACTION: rash  . Rosuvastatin     REACTION: muscle weakness  . Simvastatin     REACTION: muscle weakness  . Sulfonamide Derivatives     REACTION: nausea, abd pain   Current Outpatient Prescriptions on File Prior to Visit  Medication Sig Dispense Refill  . aspirin 81 MG tablet Take 81 mg by mouth daily.      . Calcium Citrate (  CITRACAL PO) Take 2 tablets by mouth 2 (two) times daily.    . Cholecalciferol (VITAMIN D) 2000 UNITS CAPS Take 2,000 Units by mouth daily.    Marland Kitchen desloratadine (CLARINEX) 5 MG tablet Take 1 tablet (5 mg total) by mouth daily. (Patient taking differently: Take 5 mg by mouth daily as needed. ) 90 tablet 3  . fish oil-omega-3 fatty acids 1000 MG capsule Take 1 g by mouth 2 (two) times daily.    Marland Kitchen ibuprofen (ADVIL,MOTRIN) 200 MG tablet Take 600 mg by mouth every 6 (six) hours as needed. Pain    . metroNIDAZOLE (METROGEL) 0.75 % gel Apply 1 application topically at bedtime.    . Multiple Vitamin (MULITIVITAMIN WITH MINERALS) TABS Take 1 tablet by  mouth daily.    . pimecrolimus (ELIDEL) 1 % cream Apply 1 application topically every morning.    . vitamin E 400 UNIT capsule Take 400 Units by mouth daily.     No current facility-administered medications on file prior to visit.   Review of Systems Constitutional: Negative for increased diaphoresis, other activity, appetite or other siginficant weight change  HENT: Negative for worsening hearing loss, ear pain, facial swelling, mouth sores and neck stiffness.   Eyes: Negative for other worsening pain, redness or visual disturbance.  Respiratory: Negative for shortness of breath and wheezing.   Cardiovascular: Negative for chest pain and palpitations.  Gastrointestinal: Negative for diarrhea, blood in stool, abdominal distention or other pain Genitourinary: Negative for hematuria, flank pain or change in urine volume.  Musculoskeletal: Negative for myalgias or other joint complaints.  Skin: Negative for color change and wound.  Neurological: Negative for syncope and numbness. other than noted Hematological: Negative for adenopathy. or other swelling Psychiatric/Behavioral: Negative for hallucinations, self-injury, decreased concentration or other worsening agitation.      Objective:   Physical Exam BP 152/94 mmHg  Pulse 66  Temp(Src) 98.1 F (36.7 C) (Oral)  Ht 5' 3.5" (1.613 m)  Wt 185 lb (83.915 kg)  BMI 32.25 kg/m2 VS noted,  Constitutional: Pt is oriented to person, place, and time. Appears well-developed and well-nourished.  Head: Normocephalic and atraumatic.  Right Ear: External ear normal.  Left Ear: External ear normal.  Nose: Nose normal.  Mouth/Throat: Oropharynx is clear and moist.  Eyes: Conjunctivae and EOM are normal. Pupils are equal, round, and reactive to light.  Neck: Normal range of motion. Neck supple. No JVD present. No tracheal deviation present.  Cardiovascular: Normal rate, regular rhythm, normal heart sounds and intact distal pulses.     Pulmonary/Chest: Effort normal and breath sounds without rales or wheezing  Abdominal: Soft. Bowel sounds are normal. NT. No HSM  Musculoskeletal: Normal range of motion. Exhibits no edema.  Lymphadenopathy:  Has no cervical adenopathy.  Neurological: Pt is alert and oriented to person, place, and time. Pt has normal reflexes. No cranial nerve deficit. Motor grossly intact Skin: Skin is warm and dry. No rash noted.  Psychiatric:  Has normal mood and affect. Behavior is normal.   BP Readings from Last 3 Encounters:  05/12/14 152/94  04/15/14 132/72  04/14/13 140/90       Assessment & Plan:

## 2014-05-12 NOTE — Patient Instructions (Addendum)
OK to take the lipitor 10 mg you have at home at every other day  Please continue all other medications as before, and refills have been done if requested - the prevacid and the BP medication  Please have the pharmacy call with any other refills you may need.  Please continue your efforts at being more active, low cholesterol diet, and weight control.  You are otherwise up to date with prevention measures today.  Please keep your appointments with your specialists as you may have planned  You will be contacted regarding the referral for: stress test  Continue to monitor your Blood Pressure at home as you do  Please return in 6 months, or sooner if needed, with Lab testing done 3-5 days before

## 2014-05-12 NOTE — Progress Notes (Signed)
Pre visit review using our clinic review tool, if applicable. No additional management support is needed unless otherwise documented below in the visit note. 

## 2014-05-14 ENCOUNTER — Telehealth: Payer: Self-pay | Admitting: Internal Medicine

## 2014-05-14 MED ORDER — VALSARTAN-HYDROCHLOROTHIAZIDE 160-12.5 MG PO TABS: 1.0000 | ORAL_TABLET | Freq: Every day | ORAL | Status: DC

## 2014-05-14 MED ORDER — ATORVASTATIN CALCIUM 10 MG PO TABS: 10.0000 mg | ORAL_TABLET | ORAL | Status: DC

## 2014-05-14 MED ORDER — LANSOPRAZOLE 15 MG PO CPDR: 15.0000 mg | DELAYED_RELEASE_CAPSULE | Freq: Every day | ORAL | Status: DC

## 2014-05-14 NOTE — Telephone Encounter (Signed)
Would like diovan, lipitor, and prevacid sent to [facility].

## 2014-05-14 NOTE — Telephone Encounter (Signed)
rx's sent in electronically 

## 2014-05-14 NOTE — Telephone Encounter (Signed)
emmi emailed °

## 2014-05-15 NOTE — Assessment & Plan Note (Signed)

## 2014-05-15 NOTE — Assessment & Plan Note (Addendum)
Mild elev today, asympt,  stable overall by history and exam, recent data reviewed with pt, and pt to continue medical treatment as before - declines any change today,  to f/u any worsening symptoms or concerns BP Readings from Last 3 Encounters:  05/12/14 152/94  04/15/14 132/72  04/14/13 140/90

## 2014-05-15 NOTE — Assessment & Plan Note (Signed)
For some reason not yet accomplished, for stress test

## 2014-06-17 ENCOUNTER — Telehealth: Payer: Self-pay | Admitting: Internal Medicine

## 2014-06-17 NOTE — Telephone Encounter (Signed)
All I can say is that a medication is given that makes the arteries "light up" with scans of the heart done with exercise and without.  I really couldn't explain in more detail regarding the medication as this is outside my area of practice.Marland Kitchen sorry

## 2014-06-17 NOTE — Telephone Encounter (Signed)
Pt called stated she has an stress test schedule on 06/29/14. Pt was wondering if Dr. Jenny Reichmann can give her a little info about this kind of test because she called cardiology and they refer her back to our office. Pt was wondering what kind of drug they going to give for that test and what side effect. Pt stated it last about 4 hours but she just want to know what to expect. Please call pt

## 2014-06-17 NOTE — Telephone Encounter (Signed)
Pt. Is aware.

## 2014-06-28 ENCOUNTER — Encounter (HOSPITAL_COMMUNITY): Payer: Managed Care, Other (non HMO)

## 2014-06-29 ENCOUNTER — Ambulatory Visit (HOSPITAL_COMMUNITY): Payer: Managed Care, Other (non HMO) | Attending: Cardiology | Admitting: Radiology

## 2014-06-29 DIAGNOSIS — I10 Essential (primary) hypertension: Secondary | ICD-10-CM | POA: Diagnosis not present

## 2014-06-29 DIAGNOSIS — R079 Chest pain, unspecified: Secondary | ICD-10-CM | POA: Diagnosis not present

## 2014-06-29 DIAGNOSIS — Z8249 Family history of ischemic heart disease and other diseases of the circulatory system: Secondary | ICD-10-CM | POA: Diagnosis not present

## 2014-06-29 MED ORDER — TECHNETIUM TC 99M SESTAMIBI GENERIC - CARDIOLITE
30.0000 | Freq: Once | INTRAVENOUS | Status: AC | PRN
  Administered 2014-06-29: 30 via INTRAVENOUS

## 2014-06-29 MED ORDER — TECHNETIUM TC 99M SESTAMIBI GENERIC - CARDIOLITE
11.0000 | Freq: Once | INTRAVENOUS | Status: AC | PRN
  Administered 2014-06-29: 11 via INTRAVENOUS

## 2014-06-29 NOTE — Progress Notes (Signed)
[facility] [address] [place], [place] [address] [phone]    Cardiology Nuclear Med Study  JAHLIA OMURA is a 59 y.o. female     MRN : [id number]     DOB: [birth year]  Procedure Date: 06/29/2014  Nuclear Med Background Indication for Stress Test:  Evaluation for Ischemia History:  n/a Cardiac Risk Factors: Family History - CAD and Hypertension  Symptoms:  Chest Pain   Nuclear Pre-Procedure Caffeine/Decaff Intake:  None> 12 hrs NPO After: 8:00pm   Lungs:  clear O2 Sat: 97% on room air. IV 0.9% NS with Angio Cath:  22g  IV Site: R Hand x 1, tolerated well IV Started by:  Irven Baltimore, RN  Chest Size (in):  38 Cup Size: C  Height: 5' 3.5" (1.613 m)  Weight:  184 lb (83.462 kg)  BMI:  Body mass index is 32.08 kg/(m^2). Tech Comments:  Patient took Diovan this am. Irven Baltimore, RN.    Nuclear Med Study 1 or 2 day study: 1 day  Stress Test Type:  Stress  Reading MD: N/A  Order Authorizing Provider:  Cathlean Cower, MD  Resting Radionuclide: Technetium 63m Sestamibi  Resting Radionuclide Dose: 11.0 mCi   Stress Radionuclide:  Technetium 71m Sestamibi  Stress Radionuclide Dose: 33.0 mCi           Stress Protocol Rest HR: 67 Stress HR: 142  Rest BP: 134/85 Stress BP: 206/84  Exercise Time (min): 6:15 METS: 7.30   Predicted Max HR: 159 bpm % Max HR: 89.31 bpm Rate Pressure Product: 29252   Dose of Adenosine (mg):  n/a Dose of Lexiscan: n/a mg  Dose of Atropine (mg): n/a Dose of Dobutamine: n/a mcg/kg/min (at max HR)  Stress Test Technologist: Perrin Maltese, EMT-P  Nuclear Technologist:  Earl Many, CNMT     Rest Procedure:  Myocardial perfusion imaging was performed at rest 45 minutes following the intravenous administration of Technetium 86m Sestamibi. Rest ECG: NSR - Normal EKG  Stress Procedure:  The patient exercised on the treadmill utilizing the Bruce Protocol for 6:15 minutes. The patient stopped due to fatigue, sob, and  denied any chest pain.  Technetium 63m Sestamibi was injected at peak exercise and myocardial perfusion imaging was performed after a brief delay. Stress ECG: Insignificant upsloping ST segment depression.  QPS Raw Data Images:  Normal; no motion artifact; normal heart/lung ratio. Stress Images:  Normal homogeneous uptake in all areas of the myocardium. Rest Images:  Poor, blotchy radiotracer uptake Subtraction (SDS):  0 Transient Ischemic Dilatation (Normal <1.22):  0.85 Lung/Heart Ratio (Normal <0.45):  0.26  Quantitative Gated Spect Images QGS EDV:  59 ml QGS ESV:  13 ml  Impression Exercise Capacity:  Good exercise capacity. BP Response:  Hypertensive blood pressure response. Clinical Symptoms:  There is dyspnea. ECG Impression:  Insignificant upsloping ST segment depression. Comparison with Prior Nuclear Study: No previous nuclear study performed  Overall Impression:  Normal stress nuclear study.  LV Ejection Fraction: 78%.  LV Wall Motion:  NL LV Function; NL Wall Motion   Pixie Casino, MD, [facility] Board Certified in Nuclear Cardiology Attending Cardiologist [place]

## 2014-12-06 ENCOUNTER — Emergency Department (HOSPITAL_COMMUNITY): Payer: Managed Care, Other (non HMO)

## 2014-12-06 ENCOUNTER — Emergency Department (HOSPITAL_COMMUNITY)
Admission: EM | Admit: 2014-12-06 | Discharge: 2014-12-06 | Disposition: A | Discharge status: Home / Self Care | Payer: Managed Care, Other (non HMO) | Attending: Emergency Medicine | Admitting: Emergency Medicine

## 2014-12-06 ENCOUNTER — Encounter (HOSPITAL_COMMUNITY): Payer: Self-pay | Admitting: Vascular Surgery

## 2014-12-06 DIAGNOSIS — E669 Obesity, unspecified: Secondary | ICD-10-CM | POA: Diagnosis not present

## 2014-12-06 DIAGNOSIS — Z79899 Other long term (current) drug therapy: Secondary | ICD-10-CM | POA: Diagnosis not present

## 2014-12-06 DIAGNOSIS — G43909 Migraine, unspecified, not intractable, without status migrainosus: Secondary | ICD-10-CM | POA: Insufficient documentation

## 2014-12-06 DIAGNOSIS — F419 Anxiety disorder, unspecified: Secondary | ICD-10-CM | POA: Insufficient documentation

## 2014-12-06 DIAGNOSIS — K219 Gastro-esophageal reflux disease without esophagitis: Secondary | ICD-10-CM | POA: Insufficient documentation

## 2014-12-06 DIAGNOSIS — Z87891 Personal history of nicotine dependence: Secondary | ICD-10-CM | POA: Insufficient documentation

## 2014-12-06 DIAGNOSIS — G8929 Other chronic pain: Secondary | ICD-10-CM | POA: Insufficient documentation

## 2014-12-06 DIAGNOSIS — Z7982 Long term (current) use of aspirin: Secondary | ICD-10-CM | POA: Diagnosis not present

## 2014-12-06 DIAGNOSIS — E785 Hyperlipidemia, unspecified: Secondary | ICD-10-CM | POA: Insufficient documentation

## 2014-12-06 DIAGNOSIS — Y998 Other external cause status: Secondary | ICD-10-CM | POA: Insufficient documentation

## 2014-12-06 DIAGNOSIS — R9431 Abnormal electrocardiogram [ECG] [EKG]: Secondary | ICD-10-CM | POA: Diagnosis present

## 2014-12-06 DIAGNOSIS — I10 Essential (primary) hypertension: Secondary | ICD-10-CM | POA: Diagnosis not present

## 2014-12-06 DIAGNOSIS — S39012A Strain of muscle, fascia and tendon of lower back, initial encounter: Secondary | ICD-10-CM | POA: Diagnosis not present

## 2014-12-06 DIAGNOSIS — Z880 Allergy status to penicillin: Secondary | ICD-10-CM | POA: Insufficient documentation

## 2014-12-06 DIAGNOSIS — Y9289 Other specified places as the place of occurrence of the external cause: Secondary | ICD-10-CM | POA: Diagnosis not present

## 2014-12-06 DIAGNOSIS — M1990 Unspecified osteoarthritis, unspecified site: Secondary | ICD-10-CM | POA: Insufficient documentation

## 2014-12-06 DIAGNOSIS — Z86010 Personal history of colonic polyps: Secondary | ICD-10-CM | POA: Diagnosis not present

## 2014-12-06 DIAGNOSIS — Y9389 Activity, other specified: Secondary | ICD-10-CM | POA: Diagnosis not present

## 2014-12-06 DIAGNOSIS — X58XXXA Exposure to other specified factors, initial encounter: Secondary | ICD-10-CM | POA: Diagnosis not present

## 2014-12-06 DIAGNOSIS — M791 Myalgia, unspecified site: Secondary | ICD-10-CM

## 2014-12-06 DIAGNOSIS — S29019A Strain of muscle and tendon of unspecified wall of thorax, initial encounter: Secondary | ICD-10-CM

## 2014-12-06 LAB — BASIC METABOLIC PANEL
Anion gap: 8 (ref 5–15)
BUN: 23 mg/dL — ABNORMAL HIGH (ref 6–20)
CO2: 28 mmol/L (ref 22–32)
Calcium: 9.4 mg/dL (ref 8.9–10.3)
Chloride: 100 mmol/L — ABNORMAL LOW (ref 101–111)
Creatinine, Ser: 0.67 mg/dL (ref 0.44–1.00)
GFR calc Af Amer: >60 mL/min (ref >60)
GFR calc non Af Amer: >60 mL/min (ref >60)
Glucose, Bld: 128 mg/dL — ABNORMAL HIGH (ref 65–99)
Potassium: 3.9 mmol/L (ref 3.5–5.1)
Sodium: 136 mmol/L (ref 135–145)

## 2014-12-06 LAB — CBC
HCT: 42.5 % (ref 36.0–46.0)
Hemoglobin: 14.2 g/dL (ref 12.0–15.0)
MCH: 30 pg (ref 26.0–34.0)
MCHC: 33.4 g/dL (ref 30.0–36.0)
MCV: 89.7 fL (ref 78.0–100.0)
Platelets: 271 10*3/uL (ref 150–400)
RBC: 4.74 MIL/uL (ref 3.87–5.11)
RDW: 12.3 % (ref 11.5–15.5)
WBC: 4.7 10*3/uL (ref 4.0–10.5)

## 2014-12-06 LAB — I-STAT TROPONIN, ED: Troponin i, poc: 0 ng/mL (ref 0.00–0.08)

## 2014-12-06 MED ORDER — METHOCARBAMOL 500 MG PO TABS: 500.0000 mg | ORAL_TABLET | Freq: Three times a day (TID) | ORAL | Status: DC | PRN

## 2014-12-06 MED ORDER — METHOCARBAMOL 500 MG PO TABS
1000.0000 mg | ORAL_TABLET | Freq: Once | ORAL | Status: AC
  Administered 2014-12-06: 1000 mg via ORAL
  Filled 2014-12-06: qty 2

## 2014-12-06 MED ORDER — NAPROXEN 500 MG PO TABS: 500.0000 mg | ORAL_TABLET | Freq: Two times a day (BID) | ORAL | Status: DC

## 2014-12-06 NOTE — Discharge Instructions (Signed)
Back Pain, Adult Back pain is very common. The pain often gets better over time. The cause of back pain is usually not dangerous. Most people can learn to manage their back pain on their own.  HOME CARE   Stay active. Start with short walks on flat ground if you can. Try to walk farther each day.  Do not sit, drive, or stand in one place for more than 30 minutes. Do not stay in bed.  Do not avoid exercise or work. Activity can help your back heal faster.  Be careful when you bend or lift an object. Bend at your knees, keep the object close to you, and do not twist.  Sleep on a firm mattress. Lie on your side, and bend your knees. If you lie on your back, put a pillow under your knees.  Only take medicines as told by your doctor.  Put ice on the injured area.  Put ice in a plastic bag.  Place a towel between your skin and the bag.  Leave the ice on for 15-20 minutes, 03-04 times a day for the first 2 to 3 days. After that, you can switch between ice and heat packs.  Ask your doctor about back exercises or massage.  Avoid feeling anxious or stressed. Find good ways to deal with stress, such as exercise. GET HELP RIGHT AWAY IF:   Your pain does not go away with rest or medicine.  Your pain does not go away in 1 week.  You have new problems.  You do not feel well.  The pain spreads into your legs.  You cannot control when you poop (bowel movement) or pee (urinate).  Your arms or legs feel weak or lose feeling (numbness).  You feel sick to your stomach (nauseous) or throw up (vomit).  You have belly (abdominal) pain.  You feel like you may pass out (faint). MAKE SURE YOU:   Understand these instructions.  Will watch your condition.  Will get help right away if you are not doing well or get worse. Document Released: 09/05/2007 Document Revised: 06/11/2011 Document Reviewed: 07/21/2013 [facility] Patient Information 2015 [place], [place]. This information is not intended  to replace advice given to you by your health care provider. Make sure you discuss any questions you have with your health care provider.  Muscle Strain A muscle strain is an injury that occurs when a muscle is stretched beyond its normal length. Usually a small number of muscle fibers are torn when this happens. Muscle strain is rated in degrees. First-degree strains have the least amount of muscle fiber tearing and pain. Second-degree and third-degree strains have increasingly more tearing and pain.  Usually, recovery from muscle strain takes 1-2 weeks. Complete healing takes 5-6 weeks.  CAUSES  Muscle strain happens when a sudden, violent force placed on a muscle stretches it too far. This may occur with lifting, sports, or a fall.  RISK FACTORS Muscle strain is especially common in athletes.  SIGNS AND SYMPTOMS At the site of the muscle strain, there may be:  Pain.  Bruising.  Swelling.  Difficulty using the muscle due to pain or lack of normal function. DIAGNOSIS  Your health care provider will perform a physical exam and ask about your medical history. TREATMENT  Often, the best treatment for a muscle strain is resting, icing, and applying cold compresses to the injured area.  HOME CARE INSTRUCTIONS   Use the PRICE method of treatment to promote muscle healing during the first  2-3 days after your injury. The PRICE method involves:  Protecting the muscle from being injured again.  Restricting your activity and resting the injured body part.  Icing your injury. To do this, put ice in a plastic bag. Place a towel between your skin and the bag. Then, apply the ice and leave it on from 15-20 minutes each hour. After the third day, switch to moist heat packs.  Apply compression to the injured area with a splint or elastic bandage. Be careful not to wrap it too tightly. This may interfere with blood circulation or increase swelling.  Elevate the injured body part above the level of  your heart as often as you can.  Only take over-the-counter or prescription medicines for pain, discomfort, or fever as directed by your health care provider.  Warming up prior to exercise helps to prevent future muscle strains. SEEK MEDICAL CARE IF:   You have increasing pain or swelling in the injured area.  You have numbness, tingling, or a significant loss of strength in the injured area. MAKE SURE YOU:   Understand these instructions.  Will watch your condition.  Will get help right away if you are not doing well or get worse. Document Released: 03/19/2005 Document Revised: 01/07/2013 Document Reviewed: 10/16/2012 [facility] Patient Information 2015 [place], [place]. This information is not intended to replace advice given to you by your health care provider. Make sure you discuss any questions you have with your health care provider.

## 2014-12-06 NOTE — ED Notes (Signed)
Pt reports to the ED for eval of pain to her left scapular area. She went to the [facility] because she thought it was a pulled muscle and they did and EKG and sent her here. She reports she has also been having some neck tightness, denies any injury. Denies any CP, SOB, or fatigue. Pt denies any numbness, tingling, paralysis, or bowel or bladder changes at this time. Pt A&Ox4, resp e/u, and skin warm and dry.

## 2014-12-06 NOTE — ED Provider Notes (Signed)
CSN: [id number]     Arrival date & time 12/06/14  1835 History   First MD Initiated Contact with Patient 12/06/14 1907     Chief Complaint  Patient presents with  . Back Pain  . Abnormal ECG      HPI  Patient presents for evaluation of back pain. States that last night she was grilling steak and while she was waiting she was leaning over and pulling some weeds. She states that last night and this morning she felt some pain adjacent her left scapula and her back hurts when she takes a deep breath or poor moves her arms or her back. She does not have chest pain or shortness of breath. No history of cardiac disease. Had a normal Myoview in February of this year. Has a history of hypertension. Is not diabetic. Nonsmoker.  Past Medical History  Diagnosis Date  . ANXIETY 10/28/2007  . DIVERTICULOSIS, COLON 10/28/2007  . HYPERLIPIDEMIA 10/28/2007    unable to take meds d/t allergies  . LIVER FUNCTION TESTS, ABNORMAL, HX OF 10/29/2007  . OSTEOPENIA 10/29/2007  . Presbyacusis 01/04/2010  . Irritable bowel syndrome (IBS)   . Migraine   . Obesity   . Vitamin D deficiency     takes Vit D daily  . Lumbar disc disease   . HYPERTENSION 10/28/2007    takes Diovan daily  . Seasonal allergies     takes Clarinex daily  . Vertigo     hx of  . Arthritis   . Joint pain   . Joint swelling   . Chronic back pain   . Osteoporosis   . Dry skin     red patch on right knee area  . Rosacea   . GERD 10/28/2007    takes Prevacid daily  . H/O hiatal hernia   . Internal hemorrhoids   . Hx of colonic polyps   . Cataracts, bilateral     immature   Past Surgical History  Procedure Laterality Date  . Tonsillectomy    . Tonsillectomy  at age 22  . Colonoscopy  2001/2013  . Esophagogastroduodenoscopy    . Anterior cervical decomp/discectomy fusion  08/02/2011    Procedure: ANTERIOR CERVICAL DECOMPRESSION/DISCECTOMY FUSION 3 LEVELS;  Surgeon: Elaina Hoops, MD;  Location: [facility];  Service: Neurosurgery;   Laterality: N/A;  Cervical Three-Four, Cervical Four-Five, Cervical Five-Six  Anterior Cervical Decompression Fusion    Family History  Problem Relation Age of Onset  . Atrial fibrillation Mother   . Hyperlipidemia Mother   . Stroke Mother   . Atrial fibrillation Father   . Hypothyroidism Sister   . Hypothyroidism Maternal Grandmother   . Anesthesia problems Neg Hx   . Hypotension Neg Hx   . Malignant hyperthermia Neg Hx   . Pseudochol deficiency Neg Hx    Social History  Substance Use Topics  . Smoking status: Former Research scientist (life sciences)  . Smokeless tobacco: None     Comment: quit in 62yrs ago  . Alcohol Use: Yes     Comment: occ beer/drink   OB History    No data available     Review of Systems  Constitutional: Negative for fever, chills, diaphoresis, appetite change and fatigue.  HENT: Negative for mouth sores, sore throat and trouble swallowing.   Eyes: Negative for visual disturbance.  Respiratory: Negative for cough, chest tightness, shortness of breath and wheezing.   Cardiovascular: Negative for chest pain.  Gastrointestinal: Negative for nausea, vomiting, abdominal pain, diarrhea and abdominal distention.  Endocrine: Negative for polydipsia, polyphagia and polyuria.  Genitourinary: Negative for dysuria, frequency and hematuria.  Musculoskeletal: Positive for back pain. Negative for gait problem.  Skin: Negative for color change, pallor and rash.  Neurological: Negative for dizziness, syncope, light-headedness and headaches.  Hematological: Does not bruise/bleed easily.  Psychiatric/Behavioral: Negative for behavioral problems and confusion.      Allergies  Penicillins; Rosuvastatin; Simvastatin; Statins; Sulfonamide derivatives; and Sulfa antibiotics  Home Medications   Prior to Admission medications   Medication Sig Start Date End Date Taking? Authorizing Provider  aspirin 81 MG tablet Take 81 mg by mouth daily.     Yes Historical Provider, MD  Calcium Citrate  (CITRACAL PO) Take 2 tablets by mouth daily.    Yes Historical Provider, MD  Cholecalciferol (VITAMIN D) 2000 UNITS CAPS Take 2,000 Units by mouth daily.   Yes Historical Provider, MD  fish oil-omega-3 fatty acids 1000 MG capsule Take 1 g by mouth 2 (two) times daily.   Yes Historical Provider, MD  ibuprofen (ADVIL,MOTRIN) 200 MG tablet Take 800 mg by mouth every 6 (six) hours as needed for moderate pain. Pain   Yes Historical Provider, MD  lansoprazole (PREVACID) 15 MG capsule Take 1 capsule (15 mg total) by mouth daily. 05/14/14 06/11/15 Yes Biagio Borg, MD  metroNIDAZOLE (METROGEL) 0.75 % gel Apply 1 application topically at bedtime.   Yes Historical Provider, MD  Multiple Vitamin (MULITIVITAMIN WITH MINERALS) TABS Take 1 tablet by mouth daily.   Yes Historical Provider, MD  pimecrolimus (ELIDEL) 1 % cream Apply 1 application topically every morning.   Yes Historical Provider, MD  valsartan-hydrochlorothiazide (DIOVAN-HCT) 160-12.5 MG per tablet Take 1 tablet by mouth daily. 05/14/14  Yes Biagio Borg, MD  vitamin E 400 UNIT capsule Take 400 Units by mouth daily.   Yes Historical Provider, MD  atorvastatin (LIPITOR) 10 MG tablet Take 1 tablet (10 mg total) by mouth every other day. 05/14/14   Biagio Borg, MD  desloratadine (CLARINEX) 5 MG tablet Take 1 tablet (5 mg total) by mouth daily. Patient taking differently: Take 5 mg by mouth daily as needed.  04/14/13   Biagio Borg, MD  methocarbamol (ROBAXIN) 500 MG tablet Take 1 tablet (500 mg total) by mouth 3 (three) times daily between meals as needed. 12/06/14   Tanna Furry, MD  naproxen (NAPROSYN) 500 MG tablet Take 1 tablet (500 mg total) by mouth 2 (two) times daily. 12/06/14   Tanna Furry, MD   BP 145/78 mmHg  Pulse 73  Temp(Src) 98.3 F (36.8 C) (Oral)  Resp 17  SpO2 99% Physical Exam  Constitutional: She is oriented to person, place, and time. She appears well-developed and well-nourished. No distress.  HENT:  Head: Normocephalic.  Eyes:  Conjunctivae are normal. Pupils are equal, round, and reactive to light. No scleral icterus.  Neck: Normal range of motion. Neck supple. No thyromegaly present.  Cardiovascular: Normal rate and regular rhythm.  Exam reveals no gallop and no friction rub.   No murmur heard. Pulmonary/Chest: Effort normal and breath sounds normal. No respiratory distress. She has no wheezes. She has no rales.  Abdominal: Soft. Bowel sounds are normal. She exhibits no distension. There is no tenderness. There is no rebound.  Musculoskeletal: Normal range of motion.       Back:  Reproducible tenderness adjacent to scapula. Reproduced with extension of the back. Reproducible trapezius maneuvers and with palpation. Clear lungs.  Neurological: She is alert and oriented to person, place, and  time.  Skin: Skin is warm and dry. No rash noted.  Psychiatric: She has a normal mood and affect. Her behavior is normal.    ED Course  Procedures (including critical care time) Labs Review Labs Reviewed  BASIC METABOLIC PANEL - Abnormal; Notable for the following:    Chloride 100 (*)    Glucose, Bld 128 (*)    BUN 23 (*)    All other components within normal limits  CBC  I-STAT TROPOININ, ED    Imaging Review Dg Chest 2 View  12/06/2014   CLINICAL DATA:  Chest pain  EXAM: CHEST  2 VIEW  COMPARISON:  04/16/2014  FINDINGS: Heart size and vascularity normal. Negative for heart failure. Mild scarring in the lingula is stable. Negative for pneumonia or effusion. Negative for mass lesion. Cervical ACDF plate.  IMPRESSION: Mild scarring in the lingula.  No acute abnormality.   Electronically Signed   By: Franchot Gallo M.D.   On: 12/06/2014 19:52   I have personally reviewed and evaluated these images and lab results as part of my medical decision-making.   EKG Interpretation   Date/Time:  Monday December 06 2014 18:45:05 EDT Ventricular Rate:  80 PR Interval:  120 QRS Duration: 78 QT Interval:  390 QTC Calculation:  449 R Axis:   81 Text Interpretation:  Normal sinus rhythm Cannot rule out Anterior infarct  , age undetermined Abnormal ECG Confirmed by Jeneen Rinks  MD, [place] ([id number]) on  12/06/2014 7:07:54 PM      MDM   Final diagnoses:  Muscular pain  Thoracic myofascial strain, initial encounter    Patient's pain is reproducible. Has mechanism for her injury with leaning over and pulling weeds area and does not have a murmur. Has not markedly hypertensive. Symmetric pulses. Doubt dissection. Has poor R-wave progression, but no acute changes. After more than 24 hours of symptoms has normal troponin. I think she is safe and appropriate for outpatient treatment with anti-inflammatory muscle relaxant. Precautions to return with any worsening symptoms.    Tanna Furry, MD 12/06/14 2128

## 2014-12-06 NOTE — ED Notes (Signed)
Pt left with all belongings and ambulated out of the treatment area.  

## 2014-12-17 ENCOUNTER — Emergency Department (HOSPITAL_COMMUNITY): Payer: Managed Care, Other (non HMO)

## 2014-12-17 ENCOUNTER — Telehealth: Payer: Self-pay | Admitting: Internal Medicine

## 2014-12-17 ENCOUNTER — Emergency Department (HOSPITAL_COMMUNITY)
Admission: EM | Admit: 2014-12-17 | Discharge: 2014-12-17 | Disposition: A | Discharge status: Home / Self Care | Payer: Managed Care, Other (non HMO) | Attending: Emergency Medicine | Admitting: Emergency Medicine

## 2014-12-17 ENCOUNTER — Encounter (HOSPITAL_COMMUNITY): Payer: Self-pay | Admitting: Emergency Medicine

## 2014-12-17 DIAGNOSIS — E669 Obesity, unspecified: Secondary | ICD-10-CM | POA: Insufficient documentation

## 2014-12-17 DIAGNOSIS — R9431 Abnormal electrocardiogram [ECG] [EKG]: Secondary | ICD-10-CM | POA: Insufficient documentation

## 2014-12-17 DIAGNOSIS — Z86010 Personal history of colonic polyps: Secondary | ICD-10-CM | POA: Diagnosis not present

## 2014-12-17 DIAGNOSIS — I10 Essential (primary) hypertension: Secondary | ICD-10-CM | POA: Insufficient documentation

## 2014-12-17 DIAGNOSIS — M1990 Unspecified osteoarthritis, unspecified site: Secondary | ICD-10-CM | POA: Insufficient documentation

## 2014-12-17 DIAGNOSIS — Z8659 Personal history of other mental and behavioral disorders: Secondary | ICD-10-CM | POA: Diagnosis not present

## 2014-12-17 DIAGNOSIS — M79602 Pain in left arm: Secondary | ICD-10-CM | POA: Diagnosis not present

## 2014-12-17 DIAGNOSIS — K219 Gastro-esophageal reflux disease without esophagitis: Secondary | ICD-10-CM | POA: Diagnosis not present

## 2014-12-17 DIAGNOSIS — G8929 Other chronic pain: Secondary | ICD-10-CM | POA: Diagnosis not present

## 2014-12-17 DIAGNOSIS — H269 Unspecified cataract: Secondary | ICD-10-CM | POA: Insufficient documentation

## 2014-12-17 DIAGNOSIS — Z880 Allergy status to penicillin: Secondary | ICD-10-CM | POA: Insufficient documentation

## 2014-12-17 DIAGNOSIS — Z79899 Other long term (current) drug therapy: Secondary | ICD-10-CM | POA: Insufficient documentation

## 2014-12-17 DIAGNOSIS — Z87891 Personal history of nicotine dependence: Secondary | ICD-10-CM | POA: Diagnosis not present

## 2014-12-17 DIAGNOSIS — Z7982 Long term (current) use of aspirin: Secondary | ICD-10-CM | POA: Diagnosis not present

## 2014-12-17 DIAGNOSIS — E785 Hyperlipidemia, unspecified: Secondary | ICD-10-CM | POA: Insufficient documentation

## 2014-12-17 LAB — CBC
HCT: 40.6 % (ref 36.0–46.0)
Hemoglobin: 13.7 g/dL (ref 12.0–15.0)
MCH: 30.2 pg (ref 26.0–34.0)
MCHC: 33.7 g/dL (ref 30.0–36.0)
MCV: 89.6 fL (ref 78.0–100.0)
Platelets: 224 10*3/uL (ref 150–400)
RBC: 4.53 MIL/uL (ref 3.87–5.11)
RDW: 12.4 % (ref 11.5–15.5)
WBC: 4 10*3/uL (ref 4.0–10.5)

## 2014-12-17 LAB — BASIC METABOLIC PANEL
Anion gap: 10 (ref 5–15)
BUN: 16 mg/dL (ref 6–20)
CO2: 24 mmol/L (ref 22–32)
Calcium: 9 mg/dL (ref 8.9–10.3)
Chloride: 105 mmol/L (ref 101–111)
Creatinine, Ser: 0.58 mg/dL (ref 0.44–1.00)
GFR calc Af Amer: >60 mL/min (ref >60)
GFR calc non Af Amer: >60 mL/min (ref >60)
Glucose, Bld: 97 mg/dL (ref 65–99)
Potassium: 4.1 mmol/L (ref 3.5–5.1)
Sodium: 139 mmol/L (ref 135–145)

## 2014-12-17 LAB — I-STAT TROPONIN, ED
Troponin i, poc: 0 ng/mL (ref 0.00–0.08)
Troponin i, poc: 0 ng/mL (ref 0.00–0.08)

## 2014-12-17 MED ORDER — ASPIRIN 81 MG PO CHEW
324.0000 mg | CHEWABLE_TABLET | Freq: Once | ORAL | Status: AC
  Administered 2014-12-17: 324 mg via ORAL
  Filled 2014-12-17: qty 4

## 2014-12-17 NOTE — ED Notes (Signed)
Pt able to ambulate independently.  Gait steady and even.

## 2014-12-17 NOTE — ED Notes (Signed)
Pt reports that she had an abnormal EKG 2 weeks ago. Today pt had pain to left arm.

## 2014-12-17 NOTE — Discharge Instructions (Signed)
Your EKG is unchanged from prior and your heart enzymes are normal  Please follow up with your doctor and return if any problems.   Chest Pain (Nonspecific) It is often hard to give a specific diagnosis for the cause of chest pain. There is always a chance that your pain could be related to something serious, such as a heart attack or a blood clot in the lungs. You need to follow up with your health care provider for further evaluation. CAUSES   Heartburn.  Pneumonia or bronchitis.  Anxiety or stress.  Inflammation around your heart (pericarditis) or lung (pleuritis or pleurisy).  A blood clot in the lung.  A collapsed lung (pneumothorax). It can develop suddenly on its own (spontaneous pneumothorax) or from trauma to the chest.  Shingles infection (herpes zoster virus). The chest wall is composed of bones, muscles, and cartilage. Any of these can be the source of the pain.  The bones can be bruised by injury.  The muscles or cartilage can be strained by coughing or overwork.  The cartilage can be affected by inflammation and become sore (costochondritis). DIAGNOSIS  Lab tests or other studies may be needed to find the cause of your pain. Your health care provider may have you take a test called an ambulatory electrocardiogram (ECG). An ECG records your heartbeat patterns over a 24-hour period. You may also have other tests, such as:  Transthoracic echocardiogram (TTE). During echocardiography, sound waves are used to evaluate how blood flows through your heart.  Transesophageal echocardiogram (TEE).  Cardiac monitoring. This allows your health care provider to monitor your heart rate and rhythm in real time.  Holter monitor. This is a portable device that records your heartbeat and can help diagnose heart arrhythmias. It allows your health care provider to track your heart activity for several days, if needed.  Stress tests by exercise or by giving medicine that makes the heart  beat faster. TREATMENT   Treatment depends on what may be causing your chest pain. Treatment may include:  Acid blockers for heartburn.  Anti-inflammatory medicine.  Pain medicine for inflammatory conditions.  Antibiotics if an infection is present.  You may be advised to change lifestyle habits. This includes stopping smoking and avoiding alcohol, caffeine, and chocolate.  You may be advised to keep your head raised (elevated) when sleeping. This reduces the chance of acid going backward from your stomach into your esophagus. Most of the time, nonspecific chest pain will improve within 2-3 days with rest and mild pain medicine.  HOME CARE INSTRUCTIONS   If antibiotics were prescribed, take them as directed. Finish them even if you start to feel better.  For the next few days, avoid physical activities that bring on chest pain. Continue physical activities as directed.  Do not use any tobacco products, including cigarettes, chewing tobacco, or electronic cigarettes.  Avoid drinking alcohol.  Only take medicine as directed by your health care provider.  Follow your health care provider's suggestions for further testing if your chest pain does not go away.  Keep any follow-up appointments you made. If you do not go to an appointment, you could develop lasting (chronic) problems with pain. If there is any problem keeping an appointment, call to reschedule. SEEK MEDICAL CARE IF:   Your chest pain does not go away, even after treatment.  You have a rash with blisters on your chest.  You have a fever. SEEK IMMEDIATE MEDICAL CARE IF:   You have increased chest pain or  pain that spreads to your arm, neck, jaw, back, or abdomen.  You have shortness of breath.  You have an increasing cough, or you cough up blood.  You have severe back or abdominal pain.  You feel nauseous or vomit.  You have severe weakness.  You faint.  You have chills. This is an emergency. Do not wait  to see if the pain will go away. Get medical help at once. Call your local emergency services (911 in [place] Do not drive yourself to the hospital. MAKE SURE YOU:   Understand these instructions.  Will watch your condition.  Will get help right away if you are not doing well or get worse. Document Released: 12/27/2004 Document Revised: 03/24/2013 Document Reviewed: 10/23/2007 [facility] Patient Information 2015 [place], [place]. This information is not intended to replace advice given to you by your health care provider. Make sure you discuss any questions you have with your health care provider.

## 2014-12-17 NOTE — ED Notes (Signed)
Pt sts she was at work today using the computer when she began to have left arm pain.  Pt has a hx of an abnormal EKG.

## 2014-12-17 NOTE — ED Provider Notes (Signed)
CSN: [id number]     Arrival date & time 12/17/14  1045 History   First MD Initiated Contact with Patient 12/17/14 1130     Chief Complaint  Patient presents with  . Abnormal ECG  . Arm Pain     (Consider location/radiation/quality/duration/timing/severity/associated sxs/prior Treatment) HPI   This is a 59 year old female who presents today complaining of some left arm discomfort. She states that she was seen here 4 weeks ago and her EKG was abnormal. She has not had any chest pain. She states that today she had some discomfort in her left upper arm but she finds difficult to describe. Secondary this she called her primary care physician to advised her to come into the ED for further evaluation. She states that she is not having the pain now. She denies any associated symptoms such as shortness of breath, dyspnea, or diaphoresis. She has no cardiac history  Past Medical History  Diagnosis Date  . ANXIETY 10/28/2007  . DIVERTICULOSIS, COLON 10/28/2007  . HYPERLIPIDEMIA 10/28/2007    unable to take meds d/t allergies  . LIVER FUNCTION TESTS, ABNORMAL, HX OF 10/29/2007  . OSTEOPENIA 10/29/2007  . Presbyacusis 01/04/2010  . Irritable bowel syndrome (IBS)   . Migraine   . Obesity   . Vitamin D deficiency     takes Vit D daily  . Lumbar disc disease   . HYPERTENSION 10/28/2007    takes Diovan daily  . Seasonal allergies     takes Clarinex daily  . Vertigo     hx of  . Arthritis   . Joint pain   . Joint swelling   . Chronic back pain   . Osteoporosis   . Dry skin     red patch on right knee area  . Rosacea   . GERD 10/28/2007    takes Prevacid daily  . H/O hiatal hernia   . Internal hemorrhoids   . Hx of colonic polyps   . Cataracts, bilateral     immature   Past Surgical History  Procedure Laterality Date  . Tonsillectomy    . Tonsillectomy  at age 57  . Colonoscopy  2001/2013  . Esophagogastroduodenoscopy    . Anterior cervical decomp/discectomy fusion  08/02/2011   Procedure: ANTERIOR CERVICAL DECOMPRESSION/DISCECTOMY FUSION 3 LEVELS;  Surgeon: Elaina Hoops, MD;  Location: [facility];  Service: Neurosurgery;  Laterality: N/A;  Cervical Three-Four, Cervical Four-Five, Cervical Five-Six  Anterior Cervical Decompression Fusion    Family History  Problem Relation Age of Onset  . Atrial fibrillation Mother   . Hyperlipidemia Mother   . Stroke Mother   . Atrial fibrillation Father   . Hypothyroidism Sister   . Hypothyroidism Maternal Grandmother   . Anesthesia problems Neg Hx   . Hypotension Neg Hx   . Malignant hyperthermia Neg Hx   . Pseudochol deficiency Neg Hx    Social History  Substance Use Topics  . Smoking status: Former Research scientist (life sciences)  . Smokeless tobacco: None     Comment: quit in 50yrs ago  . Alcohol Use: Yes     Comment: occ beer/drink   OB History    No data available     Review of Systems  All other systems reviewed and are negative.     Allergies  Penicillins; Rosuvastatin; Simvastatin; Statins; Sulfonamide derivatives; and Sulfa antibiotics  Home Medications   Prior to Admission medications   Medication Sig Start Date End Date Taking? Authorizing Provider  aspirin 81 MG tablet Take 81 mg  by mouth daily.      Historical Provider, MD  atorvastatin (LIPITOR) 10 MG tablet Take 1 tablet (10 mg total) by mouth every other day. 05/14/14   Biagio Borg, MD  Calcium Citrate (CITRACAL PO) Take 2 tablets by mouth daily.     Historical Provider, MD  Cholecalciferol (VITAMIN D) 2000 UNITS CAPS Take 2,000 Units by mouth daily.    Historical Provider, MD  desloratadine (CLARINEX) 5 MG tablet Take 1 tablet (5 mg total) by mouth daily. Patient taking differently: Take 5 mg by mouth daily as needed.  04/14/13   Biagio Borg, MD  fish oil-omega-3 fatty acids 1000 MG capsule Take 1 g by mouth 2 (two) times daily.    Historical Provider, MD  ibuprofen (ADVIL,MOTRIN) 200 MG tablet Take 800 mg by mouth every 6 (six) hours as needed for moderate pain.  Pain    Historical Provider, MD  lansoprazole (PREVACID) 15 MG capsule Take 1 capsule (15 mg total) by mouth daily. 05/14/14 06/11/15  Biagio Borg, MD  methocarbamol (ROBAXIN) 500 MG tablet Take 1 tablet (500 mg total) by mouth 3 (three) times daily between meals as needed. 12/06/14   Tanna Furry, MD  metroNIDAZOLE (METROGEL) 0.75 % gel Apply 1 application topically at bedtime.    Historical Provider, MD  Multiple Vitamin (MULITIVITAMIN WITH MINERALS) TABS Take 1 tablet by mouth daily.    Historical Provider, MD  naproxen (NAPROSYN) 500 MG tablet Take 1 tablet (500 mg total) by mouth 2 (two) times daily. 12/06/14   Tanna Furry, MD  pimecrolimus (ELIDEL) 1 % cream Apply 1 application topically every morning.    Historical Provider, MD  valsartan-hydrochlorothiazide (DIOVAN-HCT) 160-12.5 MG per tablet Take 1 tablet by mouth daily. 05/14/14   Biagio Borg, MD  vitamin E 400 UNIT capsule Take 400 Units by mouth daily.    Historical Provider, MD   BP 134/61 mmHg  Pulse 83  Temp(Src) 98.8 F (37.1 C) (Oral)  Resp 15  Ht 5\' 4"  (1.626 m)  Wt 187 lb (84.823 kg)  BMI 32.08 kg/m2  SpO2 98% Physical Exam  Constitutional: She is oriented to person, place, and time. She appears well-developed and well-nourished.  HENT:  Head: Normocephalic and atraumatic.  Right Ear: External ear normal.  Left Ear: External ear normal.  Nose: Nose normal.  Mouth/Throat: Oropharynx is clear and moist.  Eyes: Conjunctivae and EOM are normal. Pupils are equal, round, and reactive to light.  Neck: Normal range of motion. Neck supple.  Cardiovascular: Normal rate, regular rhythm, normal heart sounds and intact distal pulses.   Pulmonary/Chest: Effort normal and breath sounds normal.  Abdominal: Soft. Bowel sounds are normal.  Musculoskeletal: Normal range of motion.  Neurological: She is alert and oriented to person, place, and time. She has normal reflexes.  Skin: Skin is warm and dry.  Psychiatric: She has a normal mood  and affect. Her behavior is normal. Judgment and thought content normal.  Nursing note and vitals reviewed.   ED Course  Procedures (including critical care time) Labs Review Labs Reviewed  BASIC METABOLIC PANEL  [place], ED    Imaging Review No results found. I have personally reviewed and evaluated these images and lab results as part of my medical decision-making.   EKG Interpretation   Date/Time:  Friday December 17 2014 10:51:47 EDT Ventricular Rate:  89 PR Interval:  142 QRS Duration: 68 QT Interval:  364 QTC Calculation: 442 R Axis:   64  Text Interpretation:  Sinus rhythm with Premature atrial complexes Low  voltage QRS Septal infarct , age undetermined Abnormal ECG Confirmed by  RAY MD, DANIELLE [phone]) on 12/17/2014 11:08:30 AM     1:56 PM Patient continues pain-free. Plan repeat troponin normal.  Discussed results with patient and plan d/c with f/u with pmd.  Patient advised regarding return precautions and need for follow up.  MDM   Final diagnoses:  Arm pain, diffuse, left  Abnormal EKG     Pattricia Boss, MD 12/17/14 1455

## 2014-12-17 NOTE — ED Notes (Signed)
Pharmacy at bedside

## 2014-12-17 NOTE — ED Notes (Signed)
Patient undressed, in gown, on monitor, continuous pulse oximetry and blood pressure cuff 

## 2014-12-17 NOTE — Telephone Encounter (Signed)
PLEASE NOTE: All timestamps contained within this report are represented as [place] Standard Time. CONFIDENTIALTY NOTICE: This fax transmission is intended only for the addressee. It contains information that is legally privileged, confidential or otherwise protected from use or disclosure. If you are not the intended recipient, you are strictly prohibited from reviewing, disclosing, copying using or disseminating any of this information or taking any action in reliance on or regarding this information. If you have received this fax in error, please notify us immediately by telephone so that we can arrange for its return to [place]. Phone: [phone], Toll-Free: [phone], Fax: [phone] Page: 1 of 1 Call Id: [id number] [facility] Patient Name: Heather Merritt DOB: 17-Apr-1955 Initial Comment Caller states c/o left arm soreness Nurse Assessment Nurse: Mechele Dawley, RN, Amy Date/Time (Eastern Time): 12/17/2014 9:41:15 AM Confirm and document reason for call. If symptomatic, describe symptoms. ---CALLER IS HAVING SOME LEFT ARM SORENESS. SHE HAD EKG IN ER A COUPLE OF WEEKS AGO. SHE STATES IT WAS ABNORMAL. SHE HAD ONE IN FEB THAT WAS OKAY. SHE WAS DOING A FOLLOW UP FROM THAT CALL. SHE STATES THAT HER ARM IS FEELING STRANGE. SOMETIMES HER SHOULDER DOES GET TIGHT AND FEELS LIKE ITS BEEN IN A KNOT. NO CHEST PAIN, NO SOB. ARM IS NOT WEAK, NO NUMBNESS OR TINGLING. MOVEMENT DOES NOT MAKE IT WORSE. NECK SHOULDER AREA IS TIGHT/ SORE AND SHE STATES THAT IT OFTEN IS AND SHE SITS AT A DESK ALL DAY. Has the patient traveled out of the country within the last 30 days? ---Not Applicable Does the patient require triage? ---Yes Related visit to physician within the last 2 weeks? ---No Does the PT have any chronic conditions? (i.e. diabetes, asthma, etc.) ---Yes List chronic conditions. ---CHOLESTEROL, HYPERTENSION, CERVICAL  DISC SURGERY Guidelines Guideline Title Affirmed Question Affirmed Notes Shoulder Pain [1] Age > 40 AND [2] no obvious cause AND [3] pain even when not moving the arm (Exception: pain is clearly made worse by moving arm or bending neck) Final Disposition User Go to ED Now Mechele Dawley, Therapist, sports, [facility] - ED Disagree/Comply: Comply

## 2014-12-21 ENCOUNTER — Ambulatory Visit (INDEPENDENT_AMBULATORY_CARE_PROVIDER_SITE_OTHER): Payer: Managed Care, Other (non HMO) | Admitting: Internal Medicine

## 2014-12-21 ENCOUNTER — Encounter: Payer: Self-pay | Admitting: Internal Medicine

## 2014-12-21 VITALS — BP 140/80 | HR 77 | Temp 98.3°F | Ht 64.0 in | Wt 187.0 lb

## 2014-12-21 DIAGNOSIS — M5412 Radiculopathy, cervical region: Secondary | ICD-10-CM | POA: Diagnosis not present

## 2014-12-21 DIAGNOSIS — R079 Chest pain, unspecified: Secondary | ICD-10-CM | POA: Diagnosis not present

## 2014-12-21 DIAGNOSIS — I10 Essential (primary) hypertension: Secondary | ICD-10-CM | POA: Diagnosis not present

## 2014-12-21 NOTE — Progress Notes (Signed)
Pre visit review using our clinic review tool, if applicable. No additional management support is needed unless otherwise documented below in the visit note. 

## 2014-12-21 NOTE — Patient Instructions (Signed)
Please continue all other medications as before, and refills have been done if requested.  Please have the pharmacy call with any other refills you may need.  Please continue your efforts at being more active, low cholesterol diet, and weight control.  You are otherwise up to date with prevention measures today.  Please keep your appointments with your specialists as you may have planned  You will be contacted regarding the referral for: Neck MRI, and Dr Cram/NS

## 2014-12-21 NOTE — Assessment & Plan Note (Signed)
stable overall by history and exam, recent data reviewed with pt, and pt to continue medical treatment as before,  to f/u any worsening symptoms or concerns BP Readings from Last 3 Encounters:  12/21/14 140/80  12/17/14 137/73  12/06/14 145/78

## 2014-12-21 NOTE — Assessment & Plan Note (Signed)
With increased left > right neck neuritic pain x > 1 months, now occas mod to severe, declines pain med, for nsaid prn, pt declines further robaxin she has at home, also for NS referral

## 2014-12-21 NOTE — Assessment & Plan Note (Signed)
Atypical, noncardiac, ok to follow, recent stress test neg

## 2014-12-21 NOTE — Progress Notes (Signed)
Subjective:    Patient ID: Heather Merritt, female    DOB: [birth year], 59 y.o.   MRN: [id number]  HPI  Here to f/u, seen at [facility] 9/5 then ER assoc with [facility] with left upper medial scapular area pain, has been sp cervical disc surgury  - still has some pulling feeling to the bilat neck; ECG felt to have a change, later in ED the same day repeat ECG per EDP did not show acute.  Ruled out for MI with neg troponin, tx with nsaid and robaxin for MSK pain to left upper back. 1 wk later (after travel to [place] and back), has left shoulder/arm pain to the hand, went back to ED, ECG no change. Last stress test mar 2016 neg.  Now feels ok, still has recurring neck, left > right shoulder and upper back pain.  Only took nsaid and muscle relaxer x 1 dose, was scared to take further. Has had neck and left arm intermittent for many months.  No recent MRI c-spine, has seen Dr Saintclair Halsted before with c-spine surgury.  Past Medical History  Diagnosis Date  . ANXIETY 10/28/2007  . DIVERTICULOSIS, COLON 10/28/2007  . HYPERLIPIDEMIA 10/28/2007    unable to take meds d/t allergies  . LIVER FUNCTION TESTS, ABNORMAL, HX OF 10/29/2007  . OSTEOPENIA 10/29/2007  . Presbyacusis 01/04/2010  . Irritable bowel syndrome (IBS)   . Migraine   . Obesity   . Vitamin D deficiency     takes Vit D daily  . Lumbar disc disease   . HYPERTENSION 10/28/2007    takes Diovan daily  . Seasonal allergies     takes Clarinex daily  . Vertigo     hx of  . Arthritis   . Joint pain   . Joint swelling   . Chronic back pain   . Osteoporosis   . Dry skin     red patch on right knee area  . Rosacea   . GERD 10/28/2007    takes Prevacid daily  . H/O hiatal hernia   . Internal hemorrhoids   . Hx of colonic polyps   . Cataracts, bilateral     immature   Past Surgical History  Procedure Laterality Date  . Tonsillectomy    . Tonsillectomy  at age 73  . Colonoscopy  2001/2013  . Esophagogastroduodenoscopy    . Anterior cervical  decomp/discectomy fusion  08/02/2011    Procedure: ANTERIOR CERVICAL DECOMPRESSION/DISCECTOMY FUSION 3 LEVELS;  Surgeon: Elaina Hoops, MD;  Location: [facility];  Service: Neurosurgery;  Laterality: N/A;  Cervical Three-Four, Cervical Four-Five, Cervical Five-Six  Anterior Cervical Decompression Fusion     reports that she has quit smoking. She does not have any smokeless tobacco history on file. She reports that she drinks alcohol. She reports that she does not use illicit drugs. family history includes Atrial fibrillation in her father and mother; Hyperlipidemia in her mother; Hypothyroidism in her maternal grandmother and sister; Stroke in her mother. There is no history of Anesthesia problems, Hypotension, Malignant hyperthermia, or Pseudochol deficiency. Allergies  Allergen Reactions  . Penicillins Hives, Itching and Rash  . Rosuvastatin Other (See Comments)    REACTION: muscle weakness  . Simvastatin Other (See Comments)    REACTION: muscle weakness  . Statins Other (See Comments)    Myalgias and weakness  . Sulfonamide Derivatives Nausea Only and Other (See Comments)    also abd pain  . Sulfa Antibiotics Hives   Current Outpatient Prescriptions on File  Prior to Visit  Medication Sig Dispense Refill  . aspirin 81 MG tablet Take 81 mg by mouth daily.      Marland Kitchen atorvastatin (LIPITOR) 10 MG tablet Take 1 tablet (10 mg total) by mouth every other day. 45 tablet 3  . Calcium Citrate (CITRACAL PO) Take 2 tablets by mouth daily.     . Cholecalciferol (VITAMIN D) 2000 UNITS CAPS Take 2,000 Units by mouth daily.    Marland Kitchen desloratadine (CLARINEX) 5 MG tablet Take 1 tablet (5 mg total) by mouth daily. (Patient taking differently: Take 5 mg by mouth daily as needed. ) 90 tablet 3  . fish oil-omega-3 fatty acids 1000 MG capsule Take 1 g by mouth daily.     Marland Kitchen ibuprofen (ADVIL,MOTRIN) 200 MG tablet Take 800 mg by mouth every 6 (six) hours as needed for moderate pain. Pain    . lansoprazole (PREVACID) 15  MG capsule Take 1 capsule (15 mg total) by mouth daily. 90 capsule 3  . methocarbamol (ROBAXIN) 500 MG tablet Take 1 tablet (500 mg total) by mouth 3 (three) times daily between meals as needed. 20 tablet 0  . metroNIDAZOLE (METROGEL) 0.75 % gel Apply 1 application topically at bedtime.    . Multiple Vitamin (MULITIVITAMIN WITH MINERALS) TABS Take 1 tablet by mouth daily.    . naproxen (NAPROSYN) 500 MG tablet Take 1 tablet (500 mg total) by mouth 2 (two) times daily. 30 tablet 0  . pimecrolimus (ELIDEL) 1 % cream Apply 1 application topically every morning.    . valsartan-hydrochlorothiazide (DIOVAN-HCT) 160-12.5 MG per tablet Take 1 tablet by mouth daily. 90 tablet 3  . vitamin E 400 UNIT capsule Take 400 Units by mouth daily.     No current facility-administered medications on file prior to visit.   Review of Systems  Constitutional: Negative for unusual diaphoresis or night sweats HENT: Negative for ringing in ear or discharge Eyes: Negative for double vision or worsening visual disturbance.  Respiratory: Negative for choking and stridor.   Gastrointestinal: Negative for vomiting or other signifcant bowel change Genitourinary: Negative for hematuria or change in urine volume.  Musculoskeletal: Negative for other MSK pain or swelling Skin: Negative for color change and worsening wound.  Neurological: Negative for tremors and numbness other than noted  Psychiatric/Behavioral: Negative for decreased concentration or agitation other than above       Objective:   Physical Exam BP 140/80 mmHg  Pulse 77  Temp(Src) 98.3 F (36.8 C) (Oral)  Ht 5\' 4"  (1.626 m)  Wt 187 lb (84.823 kg)  BMI 32.08 kg/m2  SpO2 96% VS noted,  Constitutional: Pt appears in no significant distress HENT: Head: NCAT.  Right Ear: External ear normal.  Left Ear: External ear normal.  Eyes: . Pupils are equal, round, and reactive to light. Conjunctivae and EOM are normal Neck: Normal range of motion. Neck  supple.  Cardiovascular: Normal rate and regular rhythm.   Pulmonary/Chest: Effort normal and breath sounds without rales or wheezing.  Abd:  Soft, NT, ND, + BS Neurological: Pt is alert. Not confused , motor 5/5 intact, sens/dtr intact to UE's Skin: Skin is warm. No rash, no LE edema Psychiatric: Pt behavior is normal. No agitation.      Assessment & Plan:

## 2014-12-27 ENCOUNTER — Encounter: Payer: Self-pay | Admitting: Internal Medicine

## 2015-01-22 ENCOUNTER — Ambulatory Visit
Admission: RE | Admit: 2015-01-22 | Discharge: 2015-01-22 | Disposition: A | Discharge status: Home / Self Care | Payer: Managed Care, Other (non HMO) | Source: Ambulatory Visit | Attending: Internal Medicine | Admitting: Internal Medicine

## 2015-01-22 DIAGNOSIS — M5412 Radiculopathy, cervical region: Secondary | ICD-10-CM

## 2015-01-25 DIAGNOSIS — M5030 Other cervical disc degeneration, unspecified cervical region: Secondary | ICD-10-CM | POA: Insufficient documentation

## 2015-01-28 ENCOUNTER — Other Ambulatory Visit: Payer: Self-pay | Admitting: Internal Medicine

## 2015-01-28 ENCOUNTER — Other Ambulatory Visit: Payer: Self-pay | Admitting: Neurosurgery

## 2015-01-28 DIAGNOSIS — M5023 Other cervical disc displacement, cervicothoracic region: Secondary | ICD-10-CM

## 2015-02-07 ENCOUNTER — Ambulatory Visit
Admission: RE | Admit: 2015-02-07 | Discharge: 2015-02-07 | Disposition: A | Discharge status: Home / Self Care | Payer: Managed Care, Other (non HMO) | Source: Ambulatory Visit | Attending: Neurosurgery | Admitting: Neurosurgery

## 2015-02-07 DIAGNOSIS — M5023 Other cervical disc displacement, cervicothoracic region: Secondary | ICD-10-CM

## 2015-02-11 DIAGNOSIS — M542 Cervicalgia: Secondary | ICD-10-CM | POA: Insufficient documentation

## 2015-03-28 ENCOUNTER — Other Ambulatory Visit: Payer: Self-pay | Admitting: Internal Medicine

## 2015-04-07 ENCOUNTER — Other Ambulatory Visit: Payer: Self-pay | Admitting: Obstetrics

## 2015-04-07 DIAGNOSIS — M810 Age-related osteoporosis without current pathological fracture: Secondary | ICD-10-CM

## 2015-04-19 ENCOUNTER — Telehealth: Payer: Self-pay | Admitting: Internal Medicine

## 2015-04-19 DIAGNOSIS — Z0000 Encounter for general adult medical examination without abnormal findings: Secondary | ICD-10-CM

## 2015-04-19 NOTE — Telephone Encounter (Signed)
Please advise, patient is coming in on the 27th to see you, would like her lab work drawn ahead of time.

## 2015-04-19 NOTE — Telephone Encounter (Signed)
Lab orders done. 

## 2015-04-19 NOTE — Telephone Encounter (Signed)
Pt is scheduled for her physical on 1/27. She is requesting to get her lab work done ahead of time.  Please advise

## 2015-04-26 ENCOUNTER — Other Ambulatory Visit (INDEPENDENT_AMBULATORY_CARE_PROVIDER_SITE_OTHER): Payer: Managed Care, Other (non HMO)

## 2015-04-26 DIAGNOSIS — Z0189 Encounter for other specified special examinations: Secondary | ICD-10-CM | POA: Diagnosis not present

## 2015-04-26 DIAGNOSIS — Z0000 Encounter for general adult medical examination without abnormal findings: Secondary | ICD-10-CM

## 2015-04-26 DIAGNOSIS — R7989 Other specified abnormal findings of blood chemistry: Secondary | ICD-10-CM | POA: Diagnosis not present

## 2015-04-26 LAB — CBC WITH DIFFERENTIAL/PLATELET
Basophils Absolute: 0 10*3/uL (ref 0.0–0.1)
Basophils Relative: 0.3 % (ref 0.0–3.0)
Eosinophils Absolute: 0.1 10*3/uL (ref 0.0–0.7)
Eosinophils Relative: 1.7 % (ref 0.0–5.0)
HCT: 41.7 % (ref 36.0–46.0)
Hemoglobin: 13.9 g/dL (ref 12.0–15.0)
Lymphocytes Relative: 31.6 % (ref 12.0–46.0)
Lymphs Abs: 1.1 10*3/uL (ref 0.7–4.0)
MCHC: 33.3 g/dL (ref 30.0–36.0)
MCV: 89.9 fl (ref 78.0–100.0)
Monocytes Absolute: 0.2 10*3/uL (ref 0.1–1.0)
Monocytes Relative: 5.8 % (ref 3.0–12.0)
Neutro Abs: 2.1 10*3/uL (ref 1.4–7.7)
Neutrophils Relative %: 60.6 % (ref 43.0–77.0)
Platelets: 308 10*3/uL (ref 150.0–400.0)
RBC: 4.63 Mil/uL (ref 3.87–5.11)
RDW: 12.5 % (ref 11.5–15.5)
WBC: 3.4 10*3/uL — ABNORMAL LOW (ref 4.0–10.5)

## 2015-04-26 LAB — HEPATIC FUNCTION PANEL
ALT: 37 U/L — ABNORMAL HIGH (ref 0–35)
AST: 24 U/L (ref 0–37)
Albumin: 4.3 g/dL (ref 3.5–5.2)
Alkaline Phosphatase: 69 U/L (ref 39–117)
Bilirubin, Direct: 0 mg/dL (ref 0.0–0.3)
Total Bilirubin: 0.5 mg/dL (ref 0.2–1.2)
Total Protein: 7.1 g/dL (ref 6.0–8.3)

## 2015-04-26 LAB — LIPID PANEL
Cholesterol: 255 mg/dL — ABNORMAL HIGH (ref 0–200)
HDL: 39.9 mg/dL (ref >39.00)
NonHDL: 215.43
Total CHOL/HDL Ratio: 6
Triglycerides: 224 mg/dL — ABNORMAL HIGH (ref 0.0–149.0)
VLDL: 44.8 mg/dL — ABNORMAL HIGH (ref 0.0–40.0)

## 2015-04-26 LAB — URINALYSIS, ROUTINE W REFLEX MICROSCOPIC
Bilirubin Urine: NEGATIVE
Ketones, ur: NEGATIVE
Leukocytes, UA: NEGATIVE
Nitrite: NEGATIVE
RBC / HPF: NONE SEEN (ref 0–?)
Specific Gravity, Urine: 1.015 (ref 1.000–1.030)
Total Protein, Urine: NEGATIVE
Urine Glucose: NEGATIVE
Urobilinogen, UA: 0.2 (ref 0.0–1.0)
WBC, UA: NONE SEEN (ref 0–?)
pH: 6.5 (ref 5.0–8.0)

## 2015-04-26 LAB — BASIC METABOLIC PANEL
BUN: 18 mg/dL (ref 6–23)
CO2: 31 mEq/L (ref 19–32)
Calcium: 9.5 mg/dL (ref 8.4–10.5)
Chloride: 101 mEq/L (ref 96–112)
Creatinine, Ser: 0.65 mg/dL (ref 0.40–1.20)
GFR: 98.2 mL/min (ref >60.00)
Glucose, Bld: 109 mg/dL — ABNORMAL HIGH (ref 70–99)
Potassium: 4.2 mEq/L (ref 3.5–5.1)
Sodium: 140 mEq/L (ref 135–145)

## 2015-04-26 LAB — LDL CHOLESTEROL, DIRECT: Direct LDL: 166 mg/dL

## 2015-04-26 LAB — TSH: TSH: 2.46 u[IU]/mL (ref 0.35–4.50)

## 2015-04-29 ENCOUNTER — Encounter: Payer: Self-pay | Admitting: Internal Medicine

## 2015-04-29 ENCOUNTER — Ambulatory Visit (INDEPENDENT_AMBULATORY_CARE_PROVIDER_SITE_OTHER): Payer: Managed Care, Other (non HMO) | Admitting: Internal Medicine

## 2015-04-29 VITALS — BP 124/72 | HR 76 | Temp 98.5°F | Resp 20 | Ht 64.0 in | Wt 183.0 lb

## 2015-04-29 DIAGNOSIS — I10 Essential (primary) hypertension: Secondary | ICD-10-CM

## 2015-04-29 DIAGNOSIS — Z86010 Personal history of colonic polyps: Secondary | ICD-10-CM

## 2015-04-29 DIAGNOSIS — I491 Atrial premature depolarization: Secondary | ICD-10-CM

## 2015-04-29 DIAGNOSIS — Z0000 Encounter for general adult medical examination without abnormal findings: Secondary | ICD-10-CM | POA: Diagnosis not present

## 2015-04-29 DIAGNOSIS — K219 Gastro-esophageal reflux disease without esophagitis: Secondary | ICD-10-CM | POA: Diagnosis not present

## 2015-04-29 MED ORDER — VALSARTAN-HYDROCHLOROTHIAZIDE 160-12.5 MG PO TABS: ORAL_TABLET | ORAL | Status: DC

## 2015-04-29 MED ORDER — LANSOPRAZOLE 30 MG PO CPDR: 30.0000 mg | DELAYED_RELEASE_CAPSULE | Freq: Every day | ORAL | Status: DC

## 2015-04-29 NOTE — Assessment & Plan Note (Signed)
stable overall by history and exam, recent data reviewed with pt, and pt to continue medical treatment as before,  to f/u any worsening symptoms or concerns BP Readings from Last 3 Encounters:  04/29/15 124/72  12/21/14 140/80  12/17/14 137/73

## 2015-04-29 NOTE — Assessment & Plan Note (Signed)

## 2015-04-29 NOTE — Progress Notes (Signed)
Pre visit review using our clinic review tool, if applicable. No additional management support is needed unless otherwise documented below in the visit note. 

## 2015-04-29 NOTE — Progress Notes (Signed)
Subjective:    Patient ID: Heather Merritt, female    DOB: [birth year], 60 y.o.   MRN: [id number]  HPI  Here for wellness and f/u;  Overall doing ok;  Pt denies Chest pain, worsening SOB, DOE, wheezing, orthopnea, PND, worsening LE edema, dizziness or syncope though does have still persistent occasional palpitations, ECG sept 2016 with PACs.   Pt denies neurological change such as new headache, facial or extremity weakness.  Pt denies polydipsia, polyuria, or low sugar symptoms. Pt states overall good compliance with treatment and medications, good tolerability, and has been trying to follow appropriate diet.  Pt denies worsening depressive symptoms, suicidal ideation or panic. No fever, night sweats, wt loss, loss of appetite, or other constitutional symptoms.  Pt states good ability with ADL's, has low fall risk, home safety reviewed and adequate, no other significant changes in hearing or vision, and only occasionally active with exercise. Due for colonoscopy, also mentions has a pressure building up after meals that she cant clear with burping, sometimes assoc with a burning sensation mid chest, sometimes radiates to the mid back..  Had stress test neg last yr.  Also had CT/MRI last yr per Dr Saintclair Halsted in f/u, neck pain now improved.  Declines statin, just does not want to try. To cont working on diet. Declines tetanus for now Wt down several lbs with better diet Wt Readings from Last 3 Encounters:  04/29/15 183 lb (83.008 kg)  12/21/14 187 lb (84.823 kg)  12/17/14 187 lb (84.823 kg)   Past Medical History  Diagnosis Date  . ANXIETY 10/28/2007  . DIVERTICULOSIS, COLON 10/28/2007  . HYPERLIPIDEMIA 10/28/2007    unable to take meds d/t allergies  . LIVER FUNCTION TESTS, ABNORMAL, HX OF 10/29/2007  . OSTEOPENIA 10/29/2007  . Presbyacusis 01/04/2010  . Irritable bowel syndrome (IBS)   . Migraine   . Obesity   . Vitamin D deficiency     takes Vit D daily  . Lumbar disc disease   . HYPERTENSION  10/28/2007    takes Diovan daily  . Seasonal allergies     takes Clarinex daily  . Vertigo     hx of  . Arthritis   . Joint pain   . Joint swelling   . Chronic back pain   . Osteoporosis   . Dry skin     red patch on right knee area  . Rosacea   . GERD 10/28/2007    takes Prevacid daily  . H/O hiatal hernia   . Internal hemorrhoids   . Hx of colonic polyps   . Cataracts, bilateral     immature   Past Surgical History  Procedure Laterality Date  . Tonsillectomy    . Tonsillectomy  at age 67  . Colonoscopy  2001/2013  . Esophagogastroduodenoscopy    . Anterior cervical decomp/discectomy fusion  08/02/2011    Procedure: ANTERIOR CERVICAL DECOMPRESSION/DISCECTOMY FUSION 3 LEVELS;  Surgeon: Elaina Hoops, MD;  Location: [facility];  Service: Neurosurgery;  Laterality: N/A;  Cervical Three-Four, Cervical Four-Five, Cervical Five-Six  Anterior Cervical Decompression Fusion     reports that she has quit smoking. She does not have any smokeless tobacco history on file. She reports that she drinks alcohol. She reports that she does not use illicit drugs. family history includes Atrial fibrillation in her father and mother; Hyperlipidemia in her mother; Hypothyroidism in her maternal grandmother and sister; Stroke in her mother. There is no history of Anesthesia problems, Hypotension, Malignant hyperthermia,  or Pseudochol deficiency. Allergies  Allergen Reactions  . Penicillins Hives, Itching and Rash  . Rosuvastatin Other (See Comments)    REACTION: muscle weakness  . Simvastatin Other (See Comments)    REACTION: muscle weakness  . Statins Other (See Comments)    Myalgias and weakness  . Sulfonamide Derivatives Nausea Only and Other (See Comments)    also abd pain  . Sulfa Antibiotics Hives   Current Outpatient Prescriptions on File Prior to Visit  Medication Sig Dispense Refill  . aspirin 81 MG tablet Take 81 mg by mouth daily.      . Calcium Citrate (CITRACAL PO) Take 2 tablets  by mouth daily.     . Cholecalciferol (VITAMIN D) 2000 UNITS CAPS Take 2,000 Units by mouth daily.    Marland Kitchen desloratadine (CLARINEX) 5 MG tablet Take 1 tablet (5 mg total) by mouth daily. (Patient taking differently: Take 5 mg by mouth daily as needed. ) 90 tablet 3  . fish oil-omega-3 fatty acids 1000 MG capsule Take 1 g by mouth daily.     Marland Kitchen ibuprofen (ADVIL,MOTRIN) 200 MG tablet Take 800 mg by mouth every 6 (six) hours as needed for moderate pain. Pain    . metroNIDAZOLE (METROGEL) 0.75 % gel Apply 1 application topically at bedtime.    . Multiple Vitamin (MULITIVITAMIN WITH MINERALS) TABS Take 1 tablet by mouth daily.    . pimecrolimus (ELIDEL) 1 % cream Apply 1 application topically every morning.    . vitamin E 400 UNIT capsule Take 400 Units by mouth daily.     No current facility-administered medications on file prior to visit.   Review of Systems Constitutional: Negative for increased diaphoresis, other activity, appetite or siginficant weight change other than noted HENT: Negative for worsening hearing loss, ear pain, facial swelling, mouth sores and neck stiffness.   Eyes: Negative for other worsening pain, redness or visual disturbance.  Respiratory: Negative for shortness of breath and wheezing  Cardiovascular: Negative for chest pain and palpitations.  Gastrointestinal: Negative for diarrhea, blood in stool, abdominal distention or other pain Genitourinary: Negative for hematuria, flank pain or change in urine volume.  Musculoskeletal: Negative for myalgias or other joint complaints.  Skin: Negative for color change and wound or drainage.  Neurological: Negative for syncope and numbness. other than noted Hematological: Negative for adenopathy. or other swelling Psychiatric/Behavioral: Negative for hallucinations, SI, self-injury, decreased concentration or other worsening agitation.      Objective:   Physical Exam BP 124/72 mmHg  Pulse 76  Temp(Src) 98.5 F (36.9 C) (Oral)   Resp 20  Ht 5\' 4"  (1.626 m)  Wt 183 lb (83.008 kg)  BMI 31.40 kg/m2  SpO2 96% VS noted, obese Constitutional: Pt is oriented to person, place, and time. Appears well-developed and well-nourished, in no significant distress Head: Normocephalic and atraumatic.  Right Ear: External ear normal.  Left Ear: External ear normal.  Nose: Nose normal.  Mouth/Throat: Oropharynx is clear and moist.  Eyes: Conjunctivae and EOM are normal. Pupils are equal, round, and reactive to light.  Neck: Normal range of motion. Neck supple. No JVD present. No tracheal deviation present or significant neck LA or mass Cardiovascular: Normal rate, regular rhythm, normal heart sounds and intact distal pulses.   Pulmonary/Chest: Effort normal and breath sounds without rales or wheezing  Abdominal: Soft. Bowel sounds are normal. NT. No HSM  Musculoskeletal: Normal range of motion. Exhibits no edema.  Lymphadenopathy:  Has no cervical adenopathy.  Neurological: Pt is alert and  oriented to person, place, and time. Pt has normal reflexes. No cranial nerve deficit. Motor grossly intact Skin: Skin is warm and dry. No rash noted.  Psychiatric:  Has normal mood and affect. Behavior is normal. 2+ nervous    Assessment & Plan:

## 2015-04-29 NOTE — Assessment & Plan Note (Signed)
Due for f/u colonoscopy, will refer to GI

## 2015-04-29 NOTE — Patient Instructions (Addendum)
OK to increase the prevacid generic to 30 mg  Please continue all other medications as before, and refills have been done if requested - the blood pressure medication  Please have the pharmacy call with any other refills you may need.  Please continue your efforts at being more active, low cholesterol diet, and weight control.  You are otherwise up to date with prevention measures today.  You will be contacted regarding the referral for: Dr Perry/GI  Please keep your appointments with your specialists as you may have planned  Please return for Nurse visit as you like for the Tdap tetanus shot   Please return in 6 months, or sooner if needed

## 2015-04-29 NOTE — Assessment & Plan Note (Signed)
D/w pt, consider BB, declines for now,  to f/u any worsening symptoms or concerns

## 2015-04-29 NOTE — Assessment & Plan Note (Signed)
Suspect uncontrolled recent, to increase the prevacid to 30 mg, refer GI - ? Need EGD with the colonoscopy

## 2015-05-03 ENCOUNTER — Encounter: Payer: Self-pay | Admitting: Physician Assistant

## 2015-05-06 ENCOUNTER — Ambulatory Visit
Admission: RE | Admit: 2015-05-06 | Discharge: 2015-05-06 | Disposition: A | Discharge status: Home / Self Care | Payer: Managed Care, Other (non HMO) | Source: Ambulatory Visit | Attending: Obstetrics | Admitting: Obstetrics

## 2015-05-06 DIAGNOSIS — M810 Age-related osteoporosis without current pathological fracture: Secondary | ICD-10-CM

## 2015-05-31 ENCOUNTER — Encounter: Payer: Self-pay | Admitting: Physician Assistant

## 2015-05-31 ENCOUNTER — Ambulatory Visit (INDEPENDENT_AMBULATORY_CARE_PROVIDER_SITE_OTHER): Payer: Managed Care, Other (non HMO) | Admitting: Physician Assistant

## 2015-05-31 VITALS — BP 122/82 | HR 76 | Ht 63.5 in | Wt 185.0 lb

## 2015-05-31 DIAGNOSIS — R1310 Dysphagia, unspecified: Secondary | ICD-10-CM

## 2015-05-31 DIAGNOSIS — Z86010 Personal history of colonic polyps: Secondary | ICD-10-CM | POA: Diagnosis not present

## 2015-05-31 DIAGNOSIS — R1013 Epigastric pain: Secondary | ICD-10-CM

## 2015-05-31 DIAGNOSIS — K219 Gastro-esophageal reflux disease without esophagitis: Secondary | ICD-10-CM

## 2015-05-31 DIAGNOSIS — R0789 Other chest pain: Secondary | ICD-10-CM | POA: Diagnosis not present

## 2015-05-31 MED ORDER — NA SULFATE-K SULFATE-MG SULF 17.5-3.13-1.6 GM/177ML PO SOLN: 1.0000 | Freq: Once | ORAL | Status: DC

## 2015-05-31 NOTE — Progress Notes (Signed)
Patient ID: Heather Merritt, female   DOB: 02/07/1956, 60 y.o.   MRN: [id number]   Subjective:    Patient ID: Heather Merritt, female    DOB: September 26, 1955, 60 y.o.   MRN: [id number]  HPI  Laporche is a pleasant 60 year old white female known to Dr. Henrene Pastor who is referred by Dr. Cathlean Cower to discuss follow-up colonoscopy and possible EGD. Patient has history of adenomatous colon polyps and last had colonoscopy in March 2012 and She was noted to have internal hemorrhoids moderate left-sided diverticulosis and a diminutive polyp in the ascending colon which was removed and path consistent with a tubular adenoma. Patient has had long-term GERD and underwent an EGD in 2001 with finding of mild esophagitis and a small hiatal hernia. She has been on PPI therapy primarily Prevacid ever since that time on a daily basis. She says in the past this had controlled her symptoms well however over the past year she started having problems with episodes of lower chest pressure and sometimes burning and occasional pain radiating to her back between her shoulder blades. Says sometimes she'll get some pressure sensation up into her throat as well. She says this sporadic comes and goes and when she's having symptoms may have problems every day for a week or so and then the symptoms resolved for a while. Says she seems to have more burping and belching around this time and sometimes with a very large burp she can relieve her symptoms. She has had occasional dysphagia no regurgitation. Her appetite is fine her weight is been stable no nausea or vomiting. She increased her Prevacid from 15 mg to 30 mg within the past month but hasn't noticed any change in her symptoms thus far.  Review of Systems Pertinent positive and negative review of systems were noted in the above HPI section.  All other review of systems was otherwise negative.  Outpatient Encounter Prescriptions as of 05/31/2015  Medication Sig  . aspirin 81 MG tablet Take 81  mg by mouth daily.    . Calcium Citrate (CITRACAL PO) Take 2 tablets by mouth daily.   . Cholecalciferol (VITAMIN D) 2000 UNITS CAPS Take 2,000 Units by mouth daily.  Marland Kitchen desloratadine (CLARINEX) 5 MG tablet Take 1 tablet (5 mg total) by mouth daily. (Patient taking differently: Take 5 mg by mouth daily as needed (as needed). )  . fish oil-omega-3 fatty acids 1000 MG capsule Take 1 g by mouth daily.   Marland Kitchen ibuprofen (ADVIL,MOTRIN) 200 MG tablet Take 800 mg by mouth every 6 (six) hours as needed for moderate pain. Pain  . lansoprazole (PREVACID) 30 MG capsule Take 1 capsule (30 mg total) by mouth daily.  . metroNIDAZOLE (METROGEL) 0.75 % gel Apply 1 application topically at bedtime.  . Multiple Vitamin (MULITIVITAMIN WITH MINERALS) TABS Take 1 tablet by mouth daily.  . pimecrolimus (ELIDEL) 1 % cream Apply 1 application topically every morning.  . valsartan-hydrochlorothiazide (DIOVAN-HCT) 160-12.5 MG tablet Take 1 tablet by mouth  daily.  . vitamin E 400 UNIT capsule Take 400 Units by mouth daily.  . Na Sulfate-K Sulfate-Mg Sulf SOLN Take 1 kit by mouth once.  . [DISCONTINUED] Na Sulfate-K Sulfate-Mg Sulf SOLN Take 1 kit by mouth once.   No facility-administered encounter medications on file as of 05/31/2015.   Allergies  Allergen Reactions  . Penicillins Hives, Itching and Rash  . Rosuvastatin Other (See Comments)    REACTION: muscle weakness  . Simvastatin Other (See Comments)  REACTION: muscle weakness  . Statins Other (See Comments)    Myalgias and weakness  . Sulfonamide Derivatives Nausea Only and Other (See Comments)    also abd pain  . Sulfa Antibiotics Hives   Patient Active Problem List   Diagnosis Date Noted  . PAC (premature atrial contraction) 04/29/2015  . History of colonic polyps 04/29/2015  . Cervical radiculitis 12/21/2014  . Pain in the chest 04/15/2014  . Osteoporosis 02/27/2011  . Lumbar disc disease 02/27/2011  . Back pain 02/27/2011  . Preventative health  care 02/24/2011  . Presbyacusis 01/04/2010  . LIVER FUNCTION TESTS, ABNORMAL, HX OF 10/29/2007  . HYPERLIPIDEMIA 10/28/2007  . ANXIETY 10/28/2007  . Essential hypertension 10/28/2007  . GERD 10/28/2007  . DIVERTICULOSIS, COLON 10/28/2007   Social History   Social History  . Marital Status: Married    Spouse Name: N/A  . Number of Children: N/A  . Years of Education: N/A   Occupational History  . Not on file.   Social History Main Topics  . Smoking status: Former Research scientist (life sciences)  . Smokeless tobacco: Never Used     Comment: quit in 70yr ago  . Alcohol Use: Yes     Comment: occ beer/drink  . Drug Use: No  . Sexual Activity: Yes    Birth Control/ Protection: Post-menopausal   Other Topics Concern  . Not on file   Social History Narrative    Ms. Burgo's family history includes Atrial fibrillation in her father and mother; Hyperlipidemia in her mother; Hypothyroidism in her maternal grandmother and sister; Stroke in her mother. There is no history of Anesthesia problems, Hypotension, Malignant hyperthermia, or Pseudochol deficiency.      Objective:    Filed Vitals:   05/31/15 0916  BP: 122/82  Pulse: 76    Physical Exam  well-developed white female in no acute distress, pleasant blood pressure 122/82 pulse 76 height 5 foot 3 weight 185. HEENT; nontraumatic, cephalic EOMI PERRLA sclera anicteric, Cardiovascular; regular rate and rhythm with S1-S2 no murmur or gallop, Pulmonary; clear bilaterally, Abdomen; soft she is tender in the subxiphoid and epigastrium no guarding or rebound no palpable mass or hepatosplenomegaly bowel sounds present, Rectal ;exam not done, Extremities; no clubbing cyanosis or edema skin warm and dry, Neuropsych; mood and affect appropriate     Assessment & Plan:   #[age]year old female with history of adenomatous colon polyps due for follow-up colonoscopy. #2 chronic GERD with long-term PPI use #31 year history of intermittent subxiphoid and chest  discomfort radiating to the scapula and associated with belching intermittent dysphagia. Will rule out poorly controlled GERD, rule out biliary colic, rule out biliary dyskinesia, symptoms very atypical for cardiac #3 anxiety #4 hyperlipidemia  Plan; Will schedule for colonoscopy and EGD with possible dilation with Dr. PHenrene Pastor Procedure, risks and benefits discussed in detail with patient and she is agreeable to proceed. Will schedule for upper abdominal ultrasound. If EGD and ultrasound unrevealing then CCK HIDA scan Continue Prevacid 30 mg by mouth every morning.   Amy SGenia HaroldPA-C 05/31/2015   Cc: JBiagio Borg MD

## 2015-05-31 NOTE — Patient Instructions (Signed)

## 2015-05-31 NOTE — Progress Notes (Signed)
Agree with initial assessment and plans as outlined 

## 2015-06-15 ENCOUNTER — Telehealth: Payer: Self-pay | Admitting: Internal Medicine

## 2015-06-15 NOTE — Telephone Encounter (Signed)
Pt wanted to know what drug we used for anesthesia and if she would be intubated. Discussed with pt that we use propofol and she would be breathing on her own. Also explained to pt that she would have a CRNA present during her procedures, pt verbalized having her questions answered and that she now feels more comfortable regarding her upcoming procedure.

## 2015-06-28 ENCOUNTER — Ambulatory Visit (AMBULATORY_SURGERY_CENTER): Payer: Managed Care, Other (non HMO) | Admitting: Internal Medicine

## 2015-06-28 ENCOUNTER — Other Ambulatory Visit: Payer: Self-pay

## 2015-06-28 ENCOUNTER — Telehealth: Payer: Self-pay

## 2015-06-28 ENCOUNTER — Encounter: Payer: Self-pay | Admitting: Internal Medicine

## 2015-06-28 VITALS — BP 118/52 | HR 71 | Temp 99.3°F | Resp 22 | Ht 63.5 in | Wt 185.0 lb

## 2015-06-28 DIAGNOSIS — R1013 Epigastric pain: Secondary | ICD-10-CM

## 2015-06-28 DIAGNOSIS — R0789 Other chest pain: Secondary | ICD-10-CM

## 2015-06-28 DIAGNOSIS — Z860100 Personal history of colon polyps, unspecified: Secondary | ICD-10-CM

## 2015-06-28 DIAGNOSIS — R1310 Dysphagia, unspecified: Secondary | ICD-10-CM

## 2015-06-28 DIAGNOSIS — K219 Gastro-esophageal reflux disease without esophagitis: Secondary | ICD-10-CM | POA: Diagnosis not present

## 2015-06-28 MED ORDER — SODIUM CHLORIDE 0.9 % IV SOLN: 500.0000 mL | INTRAVENOUS | Status: DC

## 2015-06-28 NOTE — Op Note (Signed)
[facility] Patient Name: Heather Merritt Procedure Date: 06/28/2015 3:14 PM MRN: [id number] Endoscopist: Docia Chuck. Henrene Pastor , MD Age: 60 Referring MD:  Date of Birth: 11-06-1955 Gender: Female Procedure:                Upper GI endoscopy Indications:              Epigastric abdominal pain, gas bloat type symptoms.                            Infrequent. Lower chest pressure. Previous negative                            evaluations for cardiac. Vague dysphagia Medicines:                Monitored Anesthesia Care Procedure:                Pre-Anesthesia Assessment:                           - Prior to the procedure, a History and Physical                            was performed, and patient medications and                            allergies were reviewed. The patient's tolerance of                            previous anesthesia was also reviewed. The risks                            and benefits of the procedure and the sedation                            options and risks were discussed with the patient.                            All questions were answered, and informed consent                            was obtained. Prior Anticoagulants: The patient has                            taken no previous anticoagulant or antiplatelet                            agents. ASA Grade Assessment: II - A patient with                            mild systemic disease. After reviewing the risks                            and benefits, the patient was deemed in  satisfactory condition to undergo the procedure.                           After obtaining informed consent, the endoscope was                            passed under direct vision. Throughout the                            procedure, the patient's blood pressure, pulse, and                            oxygen saturations were monitored continuously. The                            Model GIF-[id number])  scope was introduced                            through the mouth, and advanced to the third part                            of duodenum. The upper GI endoscopy was                            accomplished without difficulty. The patient                            tolerated the procedure well. Scope In: Scope Out: Findings:      The esophagus was normal.      The stomach was normal except for incidental benign fundic gland type       polyps all less than 1 cm.      The examined duodenum was normal. Complications:            No immediate complications. Estimated Blood Loss:     Estimated blood loss: none. Impression:               - Normal esophagus.                           - Normal stomach.                           - Normal examined duodenum.                           - No specimens collected. Recommendation:           - Patient has a contact number available for                            emergencies. The signs and symptoms of potential                            delayed complications were discussed with the  patient. Return to normal activities tomorrow.                            Written discharge instructions were provided to the                            patient.                           - Resume previous diet.                           - Continue present medications.                           - Schedule abdominal ultrasound" epigastric pain,                            evaluate". We will contact you with the results                            when available Procedure Code(s):        --- Professional ---                           [id number], Esophagogastroduodenoscopy, flexible,                            transoral; diagnostic, including collection of                            specimen(s) by brushing or washing, when performed                            (separate procedure) CPT copyright 2016 American Medical Association. All rights reserved. Docia Chuck.  Henrene Pastor, MD 06/28/2015 3:50:21 PM This report has been signed electronically. Number of Addenda: 0 Referring MD:      Biagio Borg

## 2015-06-28 NOTE — Progress Notes (Signed)
Spoke with Rosanne Sack RN.  She will schedule abdominal ultrasound for epigastric pain per Dr. Blanch Media order.  Stated she will call pt. Tomorrow.

## 2015-06-28 NOTE — Progress Notes (Signed)
Patient awakening,vss,report to rn 

## 2015-06-28 NOTE — Patient Instructions (Signed)

## 2015-06-28 NOTE — Op Note (Signed)
[facility] Patient Name: Heather Merritt Procedure Date: 06/28/2015 3:15 PM MRN: [id number] Endoscopist: Docia Chuck. Henrene Pastor , MD Age: 60 Referring MD:  Date of Birth: April 13, 1955 Gender: Female Procedure:                Colonoscopy Indications:              Surveillance: Personal history of adenomatous                            polyps on last colonoscopy 5 years ago Medicines:                Monitored Anesthesia Care Procedure:                Pre-Anesthesia Assessment:                           - Prior to the procedure, a History and Physical                            was performed, and patient medications and                            allergies were reviewed. The patient's tolerance of                            previous anesthesia was also reviewed. The risks                            and benefits of the procedure and the sedation                            options and risks were discussed with the patient.                            All questions were answered, and informed consent                            was obtained. Prior Anticoagulants: The patient has                            taken no previous anticoagulant or antiplatelet                            agents. ASA Grade Assessment: II - A patient with                            mild systemic disease. After reviewing the risks                            and benefits, the patient was deemed in                            satisfactory condition to undergo the procedure.  After obtaining informed consent, the colonoscope                            was passed under direct vision. Throughout the                            procedure, the patient's blood pressure, pulse, and                            oxygen saturations were monitored continuously. The                            Model CF-[id number] scope was introduced                            through the anus and advanced to the the cecum,                             identified by appendiceal orifice and ileocecal                            valve. The colonoscopy was performed without                            difficulty. The patient tolerated the procedure                            well. The quality of the bowel preparation was                            excellent. The bowel preparation used was SUPREP.                            The ileocecal valve, appendiceal orifice, and                            rectum were photographed. Scope In: 3:26:10 PM Scope Out: 3:35:24 PM Scope Withdrawal Time: 0 hours 7 minutes 14 seconds  Total Procedure Duration: 0 hours 9 minutes 14 seconds  Findings:      The digital rectal exam was normal.      A few medium-mouthed diverticula were found in the transverse colon.      The exam was otherwise without abnormality on direct and retroflexion       views (internal hemorrhoids present). Complications:            No immediate complications. Estimated Blood Loss:     Estimated blood loss: none. Impression:               - Diverticulosis in the transverse colon.                           - The examination was otherwise normal on direct                            and retroflexion views.                           -  No specimens collected. Recommendation:           - Patient has a contact number available for                            emergencies. The signs and symptoms of potential                            delayed complications were discussed with the                            patient. Return to normal activities tomorrow.                            Written discharge instructions were provided to the                            patient.                           - Resume previous diet.                           - Continue present medications.                           - Repeat colonoscopy in 10 years for surveillance. Procedure Code(s):        --- Professional ---                            [phone], Colonoscopy, flexible; diagnostic, including                            collection of specimen(s) by brushing or washing,                            when performed (separate procedure) CPT copyright 2016 American Medical Association. All rights reserved. Docia Chuck. Henrene Pastor, MD 06/28/2015 3:44:07 PM This report has been signed electronically. Number of Addenda: 0 Referring MD:      Biagio Borg

## 2015-06-28 NOTE — Telephone Encounter (Signed)
Pt scheduled for [place] of abdomen at [facility] 07/04/15@8 :30am. Pt to be NPO after midnight and arrive there at 8:15am.

## 2015-06-29 ENCOUNTER — Telehealth: Payer: Self-pay

## 2015-06-29 NOTE — Telephone Encounter (Signed)
Spoke with pt and she is aware.

## 2015-06-29 NOTE — Telephone Encounter (Signed)
  Follow up Call-  Call back number 06/28/2015  Post procedure Call Back phone  # [phone]  Permission to leave phone message Yes     Patient questions:  Do you have a fever, pain , or abdominal swelling? No. Pain Score  0 *  Have you tolerated food without any problems? Yes.    Have you been able to return to your normal activities? Yes.    Do you have any questions about your discharge instructions: Diet   No. Medications  No. Follow up visit  No.  Do you have questions or concerns about your Care? No.  Actions: * If pain score is 4 or above: No action needed, pain <4.  No problems per the pt. maw

## 2015-06-30 ENCOUNTER — Encounter: Payer: Managed Care, Other (non HMO) | Admitting: Internal Medicine

## 2015-07-04 ENCOUNTER — Ambulatory Visit (HOSPITAL_COMMUNITY)
Admission: RE | Admit: 2015-07-04 | Discharge: 2015-07-04 | Disposition: A | Discharge status: Home / Self Care | Payer: Managed Care, Other (non HMO) | Source: Ambulatory Visit | Attending: Internal Medicine | Admitting: Internal Medicine

## 2015-07-04 DIAGNOSIS — R1013 Epigastric pain: Secondary | ICD-10-CM | POA: Insufficient documentation

## 2015-07-04 DIAGNOSIS — R932 Abnormal findings on diagnostic imaging of liver and biliary tract: Secondary | ICD-10-CM | POA: Insufficient documentation

## 2015-12-31 DIAGNOSIS — J310 Chronic rhinitis: Secondary | ICD-10-CM | POA: Insufficient documentation

## 2015-12-31 DIAGNOSIS — M26623 Arthralgia of bilateral temporomandibular joint: Secondary | ICD-10-CM | POA: Insufficient documentation

## 2016-03-05 ENCOUNTER — Other Ambulatory Visit: Payer: Self-pay | Admitting: Internal Medicine

## 2016-03-09 ENCOUNTER — Telehealth: Payer: Self-pay | Admitting: Internal Medicine

## 2016-03-09 ENCOUNTER — Ambulatory Visit (INDEPENDENT_AMBULATORY_CARE_PROVIDER_SITE_OTHER): Payer: Managed Care, Other (non HMO) | Admitting: Internal Medicine

## 2016-03-09 ENCOUNTER — Ambulatory Visit (INDEPENDENT_AMBULATORY_CARE_PROVIDER_SITE_OTHER)
Admission: RE | Admit: 2016-03-09 | Discharge: 2016-03-09 | Disposition: A | Discharge status: Home / Self Care | Payer: Managed Care, Other (non HMO) | Source: Ambulatory Visit | Attending: Internal Medicine | Admitting: Internal Medicine

## 2016-03-09 ENCOUNTER — Encounter: Payer: Self-pay | Admitting: Internal Medicine

## 2016-03-09 VITALS — BP 140/82 | HR 76 | Resp 20 | Wt 187.0 lb

## 2016-03-09 DIAGNOSIS — R42 Dizziness and giddiness: Secondary | ICD-10-CM | POA: Insufficient documentation

## 2016-03-09 DIAGNOSIS — M542 Cervicalgia: Secondary | ICD-10-CM | POA: Diagnosis not present

## 2016-03-09 DIAGNOSIS — I10 Essential (primary) hypertension: Secondary | ICD-10-CM | POA: Diagnosis not present

## 2016-03-09 MED ORDER — AMLODIPINE BESYLATE 2.5 MG PO TABS: 2.5000 mg | ORAL_TABLET | Freq: Every day | ORAL | 3 refills | Status: DC

## 2016-03-09 NOTE — Progress Notes (Signed)
Pre visit review using our clinic review tool, if applicable. No additional management support is needed unless otherwise documented below in the visit note. 

## 2016-03-09 NOTE — Patient Instructions (Signed)
Please take all new medication as prescribed - the amlodipine 2.5 mg per day  Please continue all other medications as before, and refills have been done if requested.  Please have the pharmacy call with any other refills you may need.  You are otherwise up to date with prevention measures today.  Please keep your appointments with your specialists as you may have planned  You will be contacted regarding the referral for: Vestibular Rehab  Please go to the XRAY Department in the Basement (go straight as you get off the elevator) for the x-ray testing  You will be contacted by phone if any changes need to be made immediately.  Otherwise, you will receive a letter about your results with an explanation, but please check with MyChart first.  Please remember to sign up for MyChart if you have not done so, as this will be important to you in the future with finding out test results, communicating by private email, and scheduling acute appointments online when needed.

## 2016-03-09 NOTE — Progress Notes (Signed)
Subjective:    Patient ID: Heather Merritt, female    DOB: 10-Aug-1955, 60 y.o.   MRN: [id number]  HPI  Here to f/u; overall doing ok,  Pt denies chest pain, increasing sob or doe, wheezing, orthopnea, PND, increased LE swelling, palpitations or syncope.  Pt denies new neurological symptoms such as new headache, or facial or extremity weakness or numbness.  Pt denies polydipsia, polyuria, or low sugar episode.   Pt denies new neurological symptoms such as new headache, or facial or extremity weakness or numbness.   Pt states overall good compliance with meds, mostly trying to follow appropriate diet, with wt overall stable  No other new history except for recurring mild positional vertigo with even mild head position changes such as getting hair done , for several months. Has some bilat ear popping and crackling  Pt denies new neurological symptoms such as new headache, or facial or extremity weakness or numbness  Has also been checking BP at home - seem to average to her about:164/100 at home, despite good med compliance Wt Readings from Last 3 Encounters:  03/09/16 187 lb (84.8 kg)  06/28/15 185 lb (83.9 kg)  05/31/15 185 lb (83.9 kg)  Also c/o anterior neck pain where it seems "something moves" s/p c-spine surgury, plans to see Dr Cram/NS as this is new to her, asks for spine films to expedite this with Dr Saintclair Halsted.   Past Medical History:  Diagnosis Date  . Allergy    SEASONAL  . ANXIETY 10/28/2007  . Arthritis   . Cataracts, bilateral    immature  . Chronic back pain   . DIVERTICULOSIS, COLON 10/28/2007  . Dry skin    red patch on right knee area  . GERD 10/28/2007   takes Prevacid daily  . H/O hiatal hernia   . Hx of colonic polyps   . HYPERLIPIDEMIA 10/28/2007   unable to take meds d/t allergies  . HYPERTENSION 10/28/2007   takes Diovan daily  . Internal hemorrhoids   . Irritable bowel syndrome (IBS)   . Joint pain   . Joint swelling   . LIVER FUNCTION TESTS, ABNORMAL, HX OF  10/29/2007  . Lumbar disc disease   . Migraine   . Obesity   . OSTEOPENIA 10/29/2007  . Osteoporosis   . Presbyacusis 01/04/2010  . Rosacea   . Seasonal allergies    takes Clarinex daily  . Vertigo    hx of  . Vitamin D deficiency    takes Vit D daily   Past Surgical History:  Procedure Laterality Date  . ANTERIOR CERVICAL DECOMP/DISCECTOMY FUSION  08/02/2011   Procedure: ANTERIOR CERVICAL DECOMPRESSION/DISCECTOMY FUSION 3 LEVELS;  Surgeon: Elaina Hoops, MD;  Location: [facility];  Service: Neurosurgery;  Laterality: N/A;  Cervical Three-Four, Cervical Four-Five, Cervical Five-Six  Anterior Cervical Decompression Fusion   . COLONOSCOPY  2001/2013  . ESOPHAGOGASTRODUODENOSCOPY    . POLYPECTOMY    . TONSILLECTOMY    . TONSILLECTOMY  at age 77    reports that she has quit smoking. She has never used smokeless tobacco. She reports that she drinks alcohol. She reports that she does not use drugs. family history includes Atrial fibrillation in her father and mother; Diabetes in her maternal grandmother; Heart disease in her mother; Hyperlipidemia in her mother; Hypothyroidism in her maternal grandmother and sister; Stroke in her mother. Allergies  Allergen Reactions  . Penicillins Hives, Itching and Rash  . Rosuvastatin Other (See Comments)    REACTION:  muscle weakness  . Simvastatin Other (See Comments)    REACTION: muscle weakness  . Statins Other (See Comments)    Myalgias and weakness  . Sulfonamide Derivatives Nausea Only and Other (See Comments)    also abd pain  . Sulfa Antibiotics Hives   Current Outpatient Prescriptions on File Prior to Visit  Medication Sig Dispense Refill  . aspirin 81 MG tablet Take 81 mg by mouth daily.      Marland Kitchen atorvastatin (LIPITOR) 10 MG tablet Take 1 tablet (10 mg total) by mouth every other day. Yearly physical w/labs due in January must see Md for refills 45 tablet 0  . Calcium Citrate (CITRACAL PO) Take 2 tablets by mouth daily.     .  Cholecalciferol (VITAMIN D) 2000 UNITS CAPS Take 2,000 Units by mouth daily.    Marland Kitchen desloratadine (CLARINEX) 5 MG tablet Take 1 tablet (5 mg total) by mouth daily. 90 tablet 3  . fish oil-omega-3 fatty acids 1000 MG capsule Take 1 g by mouth daily.     Marland Kitchen ibuprofen (ADVIL,MOTRIN) 200 MG tablet Take 800 mg by mouth every 6 (six) hours as needed for moderate pain. Pain    . lansoprazole (PREVACID) 30 MG capsule Take 1 capsule (30 mg total) by mouth daily. Yearly physical due in January must see MD for refills 90 capsule 0  . metroNIDAZOLE (METROGEL) 0.75 % gel Apply 1 application topically at bedtime.    . Multiple Vitamin (MULITIVITAMIN WITH MINERALS) TABS Take 1 tablet by mouth daily.    . pimecrolimus (ELIDEL) 1 % cream Apply 1 application topically every morning.    . valsartan-hydrochlorothiazide (DIOVAN-HCT) 160-12.5 MG tablet Take 1 tablet by mouth daily. Yearly physical due in January must see MD for refills 90 tablet 0  . vitamin E 400 UNIT capsule Take 400 Units by mouth daily.     No current facility-administered medications on file prior to visit.    Review of Systems  Constitutional: Negative for unusual diaphoresis or night sweats HENT: Negative for ear swelling or discharge Eyes: Negative for worsening visual haziness  Respiratory: Negative for choking and stridor.   Gastrointestinal: Negative for distension or worsening eructation Genitourinary: Negative for retention or change in urine volume.  Musculoskeletal: Negative for other MSK pain or swelling Skin: Negative for color change and worsening wound Neurological: Negative for tremors and numbness other than noted  Psychiatric/Behavioral: Negative for decreased concentration or agitation other than above   All other system neg per pt    Objective:   Physical Exam BP 140/82   Pulse 76   Resp 20   Wt 187 lb (84.8 kg)   SpO2 96%   BMI 32.61 kg/m  VS noted,  Constitutional: Pt appears in no apparent distress HENT: Head:  NCAT.  Right Ear: External ear normal.  Left Ear: External ear normal.  Eyes: . Pupils are equal, round, and reactive to light. Conjunctivae and EOM are normal Neck: Normal range of motion. Neck supple. No mass or swelling appreciated Spine nontender Cardiovascular: Normal rate and regular rhythm.   Pulmonary/Chest: Effort normal and breath sounds without rales or wheezing.  Neurological: Pt is alert. Not confused , motor grossly intact, cn 2-12 intact Skin: Skin is warm. No rash, no LE edema Psychiatric: Pt behavior is normal. No agitation.     Assessment & Plan:

## 2016-03-09 NOTE — Telephone Encounter (Signed)
Please enter CPE lab orders for CPE in juanuary

## 2016-03-10 NOTE — Assessment & Plan Note (Signed)
Exam benign, etiology unclaer, neuro no changes, for cspine films to help faciliate NS exam

## 2016-03-10 NOTE — Assessment & Plan Note (Addendum)
Mild uncontrolled, to add amlodipine 2.5 qd, o/w stable overall by history and exam, recent data reviewed with pt, and pt to continue medical treatment as before,  to f/u any worsening symptoms or concerns BP Readings from Last 3 Encounters:  03/09/16 140/82  06/28/15 (!) 118/52  05/31/15 122/82

## 2016-03-10 NOTE — Assessment & Plan Note (Signed)
Assoc with tinnitus and eustachain tube symptoms  For mucinex otc prn, also refer Vestibular rehab PT

## 2016-03-30 ENCOUNTER — Ambulatory Visit: Payer: Managed Care, Other (non HMO) | Attending: Internal Medicine

## 2016-03-30 DIAGNOSIS — R2689 Other abnormalities of gait and mobility: Secondary | ICD-10-CM | POA: Diagnosis present

## 2016-03-30 DIAGNOSIS — R42 Dizziness and giddiness: Secondary | ICD-10-CM

## 2016-03-30 NOTE — Therapy (Signed)
[facility] [address] [place], [place], [address] Phone: [phone]   Fax:  [phone]  Physical Therapy Evaluation  Patient Details  Name: Heather Merritt MRN: [id number] Date of Birth: September 09, 1955 Referring Provider: Dr. Jenny Reichmann  Encounter Date: 03/30/2016      PT End of Session - 03/30/16 1514    Visit Number 1   Number of Visits 9   Date for PT Re-Evaluation 04/29/16   Authorization Type [employer] Managed   PT Start Time 1430   PT Stop Time 1526   PT Time Calculation (min) 56 min   Equipment Utilized During Treatment --  S prn   Activity Tolerance Patient tolerated treatment well   Behavior During Therapy [facility] for tasks assessed/performed      Past Medical History:  Diagnosis Date  . Allergy    SEASONAL  . ANXIETY 10/28/2007  . Arthritis   . Cataracts, bilateral    immature  . Chronic back pain   . DIVERTICULOSIS, COLON 10/28/2007  . Dry skin    red patch on right knee area  . GERD 10/28/2007   takes Prevacid daily  . H/O hiatal hernia   . Hx of colonic polyps   . HYPERLIPIDEMIA 10/28/2007   unable to take meds d/t allergies  . HYPERTENSION 10/28/2007   takes Diovan daily  . Internal hemorrhoids   . Irritable bowel syndrome (IBS)   . Joint pain   . Joint swelling   . LIVER FUNCTION TESTS, ABNORMAL, HX OF 10/29/2007  . Lumbar disc disease   . Migraine   . Obesity   . OSTEOPENIA 10/29/2007  . Osteoporosis   . Presbyacusis 01/04/2010  . Rosacea   . Seasonal allergies    takes Clarinex daily  . Vertigo    hx of  . Vitamin D deficiency    takes Vit D daily    Past Surgical History:  Procedure Laterality Date  . ANTERIOR CERVICAL DECOMP/DISCECTOMY FUSION  08/02/2011   Procedure: ANTERIOR CERVICAL DECOMPRESSION/DISCECTOMY FUSION 3 LEVELS;  Surgeon: Elaina Hoops, MD;  Location: [facility];  Service: Neurosurgery;  Laterality: N/A;  Cervical Three-Four, Cervical Four-Five, Cervical Five-Six  Anterior  Cervical Decompression Fusion   . COLONOSCOPY  2001/2013  . ESOPHAGOGASTRODUODENOSCOPY    . POLYPECTOMY    . TONSILLECTOMY    . TONSILLECTOMY  at age 7    There were no vitals filed for this visit.       Subjective Assessment - 03/30/16 1434    Subjective Pt reported dizziness began a long time ago but dizziness has become progressively worse over the last year. Pt reports she also has tinnitus, which is her main concern. Looking up, down, and side to side head movements cause dizziness. Pt fell right before Christmas due to uneven pavement but denied hitting head. Pt did have an episode unsteadiness for one day eariler this month that last 4-5 hours and then subsided. Dizziness typically lasts approx. <1 minute and wooziness <5 mintues after onset. Pt reports sitting still makes dizziness better. Pt has hx of Cx fusion. Pt has seen ENT and they aren't sure what is causing tinnitus. Pt denied migraine, despite chart reporting migraine. Pt reports intermittent HA.  Pt denied hearing loss but reported head pressure/congestion. Intermittent R hearing amplified and L ear feels like it needs to pop, pt ceased Clarinex per MD orders.    Pertinent History HTN, hyperlipidemia, GERD, hx of Cx ACDF in 2013, immature B cataracts, osteoporosis,  Lx disc disease.   Patient Stated Goals To have better balance, decr. pressure in head   Currently in Pain? No/denies            [facility] PT Assessment - 03/30/16 1446      Assessment   Medical Diagnosis Vertigo   Referring Provider Dr. Jenny Reichmann   Onset Date/Surgical Date 03/31/15   Hand Dominance Right   Prior Therapy not for dizziness     Precautions   Precautions Fall     Restrictions   Weight Bearing Restrictions No   Other Position/Activity Restrictions Pt reports L knee still hurts when kneeling s/p fall     Balance Screen   Has the patient fallen in the past 6 months Yes   How many times? 1   Has the patient had a decrease in activity level  because of a fear of falling?  No   Is the patient reluctant to leave their home because of a fear of falling?  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      Sensation   Additional Comments Pt reports Cx radiculopathy which was present prior to Cx ACDF.     Ambulation/Gait   Ambulation/Gait Yes   Ambulation/Gait Assistance 7: Independent   Ambulation Distance (Feet) 100 Feet   Assistive device None   Gait Pattern Step-through pattern;Within Functional Limits  however, pt amb. in guarded manner   Ambulation Surface Level;Indoor   Gait velocity 3.56ft/sec.  no AD            Vestibular Assessment - 03/30/16 1456      Symptom Behavior   Type of Dizziness Unsteady with head/body turns   Frequency of Dizziness Daily   Duration of Dizziness initial: <1 minute and then wooziness up to 5 minutes. One bout lasted 4-5 hours.    Aggravating Factors Turning head quickly;Turning body quickly;Looking up to the ceiling  looking down, going down steps(pt wears bifocals)   Relieving Factors Rest     Occulomotor Exam   Occulomotor Alignment Normal    Spontaneous Absent   Gaze-induced Absent   Smooth Pursuits Intact   Saccades Intact     Vestibulo-Occular Reflex   VOR 1 Head Only (x 1 viewing) WNL but pt did report 4/10 dizziness.   VOR Cancellation Normal  pt reported 4/10 dizziness.   Comment B Negative head thrust test.     Positional Testing   Dix-Hallpike Dix-Hallpike Left;Dix-Hallpike Right   Horizontal Canal Testing Horizontal Canal Right;Horizontal Canal Left     Dix-Hallpike Right   Dix-Hallpike Right Duration none   Dix-Hallpike Right Symptoms  No nystagmus     Dix-Hallpike Left   Dix-Hallpike Left Duration none   Dix-Hallpike Left Symptoms No nystagmus     Horizontal Canal Right   Horizontal Canal Right Duration none   Horizontal Canal Right Symptoms Normal     Horizontal Canal Left   Horizontal Canal Left Duration none   Horizontal Canal Left Symptoms Normal     Positional Sensitivities   Supine to Sitting Lightheadedness     Orthostatics   BP supine (x 5 minutes) 136/75   HR supine (x 5 minutes) 71   BP sitting 146/77  Lightheadedness upon sitting upright 4-5/10   HR sitting 76   BP standing (after 1 minute) 138/73   HR standing (after 1 minute) 78  no reports of lightheadedness.                       PT Education - 03/30/16 1513    Education provided Yes   Education Details PT discussed POC (frequency/duration) and exam findings/outcome measures.   Person(s) Educated Patient   Methods Explanation   Comprehension Verbalized understanding          PT Short Term Goals - 03/30/16 1514      PT SHORT TERM GOAL #1   Title same as LTGs           PT Long Term Goals - 03/30/16 1514      PT LONG TERM GOAL #1   Title Pt will be IND in HEP to reduce dizziness and improve balance. TARGET DATE: 04/27/16   Status New     PT LONG TERM GOAL #2   Title Pt will report dizziness </=2/10 in order to safely amb. and perform ADLs.   Status New     PT LONG TERM GOAL #3   Title  Perform FGA and SOT and writes goals as indicated.   Status New     PT LONG TERM GOAL #4   Title Pt will amb. 1000' over even/uneven terrain, IND, while performing head turns without LOB in order to improve functional mobility.    Status New               Plan - 03/30/16 1531    Clinical Impression Statement Pt is a pleasant 60 y/o female presenting to OPPT neuro for vertigo. Pt's PMH significant for the following: HTN, hyperlipidemia, GERD, hx of Cx ACDF in 2013, immature B cataracts, osteoporosis, Lx disc disease. Pt presented with the following impairments: dizziness, gait deviations, tinnitus, impaired balance, and impaired cervical AROM and PROM 2/2 Cx ACDF in 2013, therefore, Cx musculature weakness also suspected. Pt exam negative for positional vertigo and orthostasis. Pt reported reproduction of sx's during VOR and supine to sit, indicating likely decr. vestibular input. If pt does not improve within 6 visits, PT will refer back to MD. Pt would benefit from skilled PT to improve safety during functional mobility and reduce dizziness.    Rehab Potential Good   Clinical Impairments Affecting Rehab Potential Hx of Cx ACDF   PT Frequency 2x / week   PT Duration 4 weeks   PT Treatment/Interventions ADLs/Self Care Home Management;Canalith Repostioning;Neuromuscular re-education;Balance training;Therapeutic exercise;Therapeutic activities;Functional mobility training;Gait training;DME Instruction;Patient/family education;Vestibular;Manual techniques   PT Next Visit Plan Perform SOT and FGA and write goals, intiate gaze stab. and balance HEP.   Consulted and Agree with Plan of Care Patient      Patient will benefit from skilled therapeutic intervention in order to improve  the following deficits and impairments:  Abnormal gait, Impaired flexibility, Decreased range of motion, Dizziness, Decreased mobility, Decreased balance, Decreased knowledge of use of DME, Decreased strength  Visit  Diagnosis: Dizziness and giddiness - Plan: PT plan of care cert/re-cert  Other abnormalities of gait and mobility - Plan: PT plan of care cert/re-cert     Problem List Patient Active Problem List   Diagnosis Date Noted  . Anterior neck pain 03/09/2016  . Vertigo 03/09/2016  . PAC (premature atrial contraction) 04/29/2015  . History of colonic polyps 04/29/2015  . Cervical radiculitis 12/21/2014  . Pain in the chest 04/15/2014  . Osteoporosis 02/27/2011  . Lumbar disc disease 02/27/2011  . Back pain 02/27/2011  . Preventative health care 02/24/2011  . Presbyacusis 01/04/2010  . LIVER FUNCTION TESTS, ABNORMAL, HX OF 10/29/2007  . HYPERLIPIDEMIA 10/28/2007  . ANXIETY 10/28/2007  . Essential hypertension 10/28/2007  . GERD 10/28/2007  . DIVERTICULOSIS, COLON 10/28/2007    Jahaira Earnhart L 03/30/2016, 3:40 PM  [facility] [address] [place], [place], [address] Phone: [phone]   Fax:  [phone]  Name: Heather Merritt MRN: [id number] Date of Birth: 13-Aug-1955  Geoffry Paradise, PT,DPT 03/30/16 3:40 PM Phone: [phone] Fax: [phone]

## 2016-04-03 ENCOUNTER — Ambulatory Visit: Payer: Managed Care, Other (non HMO) | Attending: Internal Medicine

## 2016-04-03 DIAGNOSIS — R2689 Other abnormalities of gait and mobility: Secondary | ICD-10-CM

## 2016-04-03 DIAGNOSIS — R42 Dizziness and giddiness: Secondary | ICD-10-CM | POA: Diagnosis present

## 2016-04-03 NOTE — Patient Instructions (Addendum)
Gaze Stabilization: Tip Card  1.Target must remain in focus, not blurry, and appear stationary while head is in motion. 2.Perform exercises with small head movements (45 to either side of midline). 3.Increase speed of head motion so long as target is in focus. 4.If you wear eyeglasses, be sure you can see target through lens (therapist will give specific instructions for bifocal / progressive lenses). 5.These exercises may provoke dizziness or nausea. Work through these symptoms. If too dizzy, slow head movement slightly. Rest between each exercise. 6.Exercises demand concentration; avoid distractions. 7.For safety, perform standing exercises close to a counter, wall, corner, or next to someone.  Copyright  VHI. All rights reserved.   Gaze Stabilization: Sitting    Keeping eyes on target on wall 3-5 feet away, tilt head down 15-30 and move head side to side for __20-30__ seconds. Repeat while moving head up and down for _20-30___ seconds. Do __2__ sessions per day.  Copyright  VHI. All rights reserved.   Feet Together (Compliant Surface) Head Motion - Eyes Closed    Stand on compliant surface: __pillow/cushion______ with feet together. Close eyes and move head slowly, up and down 5 times and side to side 5 times. Repeat __3__ times per session. Do __1__ sessions per day.  Copyright  VHI. All rights reserved.

## 2016-04-03 NOTE — Therapy (Signed)
[facility] [address] [place], [place], [address] Phone: [phone]   Fax:  [phone]  Physical Therapy Treatment  Patient Details  Name: Heather Merritt MRN: [id number] Date of Birth: [birth year] Referring Provider: Dr. Jenny Reichmann  Encounter Date: 04/03/2016      PT End of Session - 04/03/16 0926    Visit Number 2   Number of Visits 9   Date for PT Re-Evaluation 04/29/16   Authorization Type [employer] Managed-40 visit limit combined per Lattie Haw   PT Start Time 0804   PT Stop Time 0845   PT Time Calculation (min) 41 min   Equipment Utilized During Treatment --  SOT harness and min guard prn   Activity Tolerance Patient tolerated treatment well   Behavior During Therapy [facility] for tasks assessed/performed      Past Medical History:  Diagnosis Date  . Allergy    SEASONAL  . ANXIETY 10/28/2007  . Arthritis   . Cataracts, bilateral    immature  . Chronic back pain   . DIVERTICULOSIS, COLON 10/28/2007  . Dry skin    red patch on right knee area  . GERD 10/28/2007   takes Prevacid daily  . H/O hiatal hernia   . Hx of colonic polyps   . HYPERLIPIDEMIA 10/28/2007   unable to take meds d/t allergies  . HYPERTENSION 10/28/2007   takes Diovan daily  . Internal hemorrhoids   . Irritable bowel syndrome (IBS)   . Joint pain   . Joint swelling   . LIVER FUNCTION TESTS, ABNORMAL, HX OF 10/29/2007  . Lumbar disc disease   . Migraine   . Obesity   . OSTEOPENIA 10/29/2007  . Osteoporosis   . Presbyacusis 01/04/2010  . Rosacea   . Seasonal allergies    takes Clarinex daily  . Vertigo    hx of  . Vitamin D deficiency    takes Vit D daily    Past Surgical History:  Procedure Laterality Date  . ANTERIOR CERVICAL DECOMP/DISCECTOMY FUSION  08/02/2011   Procedure: ANTERIOR CERVICAL DECOMPRESSION/DISCECTOMY FUSION 3 LEVELS;  Surgeon: Elaina Hoops, MD;  Location: [facility];  Service: Neurosurgery;  Laterality: N/A;  Cervical  Three-Four, Cervical Four-Five, Cervical Five-Six  Anterior Cervical Decompression Fusion   . COLONOSCOPY  2001/2013  . ESOPHAGOGASTRODUODENOSCOPY    . POLYPECTOMY    . TONSILLECTOMY    . TONSILLECTOMY  at age 17    There were no vitals filed for this visit.      Subjective Assessment - 04/03/16 0807    Subjective Pt denied changes or falls since last visit. No dizziness at rest but states she has some pressure and congestion in her head.    Pertinent History HTN, hyperlipidemia, GERD, hx of Cx ACDF in 2013, immature B cataracts, osteoporosis, Lx disc disease.   Patient Stated Goals To have better balance, decr. pressure in head   Currently in Pain? No/denies            [facility] PT Assessment - 04/03/16 0807      Functional Gait  Assessment   Gait assessed  Yes   Gait Level Surface Walks 20 ft in less than 7 sec but greater than 5.5 sec, uses assistive device, slower speed, mild gait deviations, or deviates 6-10 in outside of the 12 in walkway width.  5.8sec.   Change in Gait Speed Able to smoothly change walking speed without loss of balance or gait deviation. Deviate no more than 6  in outside of the 12 in walkway width.   Gait with Horizontal Head Turns Performs head turns smoothly with slight change in gait velocity (eg, minor disruption to smooth gait path), deviates 6-10 in outside 12 in walkway width, or uses an assistive device.   Gait with Vertical Head Turns Performs task with slight change in gait velocity (eg, minor disruption to smooth gait path), deviates 6 - 10 in outside 12 in walkway width or uses assistive device   Gait and Pivot Turn Pivot turns safely within 3 sec and stops quickly with no loss of balance.   Step Over Obstacle Is able to step over 2 stacked shoe boxes taped together (9 in total height) without changing gait speed. No evidence of imbalance.   Gait with Narrow Base of Support Is able to ambulate for 10 steps heel to toe with no staggering.   Gait with  Eyes Closed Walks 20 ft, no assistive devices, good speed, no evidence of imbalance, normal gait pattern, deviates no more than 6 in outside 12 in walkway width. Ambulates 20 ft in less than 7 sec.   Ambulating Backwards Walks 20 ft, no assistive devices, good speed, no evidence for imbalance, normal gait   Steps Alternating feet, no rail.   Total Score 27   FGA comment: Indicates pt is at low falls risk.      Neuro re-ed: Pt performed gaze stabilization and feet together on pillows with EC and head turns with S for safety and cues for technique. Please see pt instructions for details.  Neuro re-ed: sensory organization test performed with following results: Conditions: 1:  3 trials WNL 2:  2 trials below normal 3:  3 trials WNL 4: 1 trial below normal and 2 WNL 5: 3 trials WNL 6: 3 trials WNL Composite score:  52 (WNL for age group of 32-69y/o) Sensory Analysis [id number] normal ~90 Vis: WNL Vest: WNL Pref: WNL Strategy analysis:    Good use of hip and ankle strategies.    COG alignment:      Midline with slight posterior bias.                           PT Education - 04/03/16 0925    Education provided Yes   Education Details PT discussed outcome measure and SOT results. PT provided pt with HEP.   Person(s) Educated Patient   Methods Explanation;Demonstration;Verbal cues;Handout   Comprehension Returned demonstration;Verbalized understanding          PT Short Term Goals - 03/30/16 1514      PT SHORT TERM GOAL #1   Title same as LTGs           PT Long Term Goals - 04/03/16 0929      PT LONG TERM GOAL #1   Title Pt will be IND in HEP to reduce dizziness and improve balance. TARGET DATE: 04/27/16   Status New     PT LONG TERM GOAL #2   Title Pt will report dizziness </=2/10 in order to safely amb. and perform ADLs.   Baseline SOT WNL on 04/03/16 and FGA: 27/30   Status New     PT LONG TERM GOAL #3   Title Perform FGA and SOT and writes  goals as indicated.   Status Achieved     PT LONG TERM GOAL #4   Title Pt will amb. 1000' over even/uneven terrain, IND, while performing head turns without LOB in  order to improve functional mobility.    Status New     PT LONG TERM GOAL #5   Title Pt will improve FGA score to 30/30 to reduce falls risk.    Status New               Plan - 04/03/16 [id number]    Clinical Impression Statement Pt's FGA score places pt at low falls risk and SOT composite score was WNL for her age group. Per SOT, pt's somatosensory input is just below normal limits. However, pt experienced dizziness during head turns (FGA) and 10/10 dizziness during EC with platform movement on SOT. Therefore, PT will continue to address balance and dizziness impairments. PT reiterated that pt will need to f/u with MD regarding head pressure/congestion. Continue with POC.    Rehab Potential Good   Clinical Impairments Affecting Rehab Potential Hx of Cx ACDF   PT Frequency 2x / week   PT Duration 4 weeks   PT Treatment/Interventions ADLs/Self Care Home Management;Canalith Repostioning;Neuromuscular re-education;Balance training;Therapeutic exercise;Therapeutic activities;Functional mobility training;Gait training;DME Instruction;Patient/family education;Vestibular;Manual techniques   PT Next Visit Plan Review HEP as needed. High level balance with head turns over compliant surfaces.   PT Home Exercise Plan Gaze stab. and balance HEP.   Consulted and Agree with Plan of Care Patient      Patient will benefit from skilled therapeutic intervention in order to improve the following deficits and impairments:  Abnormal gait, Impaired flexibility, Decreased range of motion, Dizziness, Decreased mobility, Decreased balance, Decreased knowledge of use of DME, Decreased strength  Visit Diagnosis: Dizziness and giddiness  Other abnormalities of gait and mobility     Problem List Patient Active Problem List   Diagnosis Date Noted   . Anterior neck pain 03/09/2016  . Vertigo 03/09/2016  . PAC (premature atrial contraction) 04/29/2015  . History of colonic polyps 04/29/2015  . Cervical radiculitis 12/21/2014  . Pain in the chest 04/15/2014  . Osteoporosis 02/27/2011  . Lumbar disc disease 02/27/2011  . Back pain 02/27/2011  . Preventative health care 02/24/2011  . Presbyacusis 01/04/2010  . LIVER FUNCTION TESTS, ABNORMAL, HX OF 10/29/2007  . HYPERLIPIDEMIA 10/28/2007  . ANXIETY 10/28/2007  . Essential hypertension 10/28/2007  . GERD 10/28/2007  . DIVERTICULOSIS, COLON 10/28/2007    Ka Bench L 04/03/2016, 1:45 PM  [facility] [address] [place], [place], [address] Phone: [phone]   Fax:  [phone]  Name: Heather Merritt MRN: [id number] Date of Birth: 05-09-1955  Geoffry Paradise, PT,DPT 04/03/16 1:48 PM Phone: [phone] Fax: [phone]

## 2016-04-06 ENCOUNTER — Ambulatory Visit: Payer: Managed Care, Other (non HMO)

## 2016-04-06 DIAGNOSIS — R42 Dizziness and giddiness: Secondary | ICD-10-CM | POA: Diagnosis not present

## 2016-04-06 DIAGNOSIS — R2689 Other abnormalities of gait and mobility: Secondary | ICD-10-CM

## 2016-04-06 NOTE — Therapy (Signed)
[facility] [address] [place], [place], [address] Phone: [phone]   Fax:  [phone]  Physical Therapy Treatment  Patient Details  Name: Heather Merritt MRN: [id number] Date of Birth: 1955-05-06 Referring Provider: Dr. Jenny Reichmann  Encounter Date: 04/06/2016      PT End of Session - 04/06/16 1631    Visit Number 3   Number of Visits 9   Date for PT Re-Evaluation 04/29/16   Authorization Type [employer] Managed-40 visit limit combined per Lattie Haw   PT Start Time 1535   PT Stop Time 1614   PT Time Calculation (min) 39 min   Equipment Utilized During Treatment Gait belt   Activity Tolerance Patient tolerated treatment well   Behavior During Therapy [facility] for tasks assessed/performed      Past Medical History:  Diagnosis Date  . Allergy    SEASONAL  . ANXIETY 10/28/2007  . Arthritis   . Cataracts, bilateral    immature  . Chronic back pain   . DIVERTICULOSIS, COLON 10/28/2007  . Dry skin    red patch on right knee area  . GERD 10/28/2007   takes Prevacid daily  . H/O hiatal hernia   . Hx of colonic polyps   . HYPERLIPIDEMIA 10/28/2007   unable to take meds d/t allergies  . HYPERTENSION 10/28/2007   takes Diovan daily  . Internal hemorrhoids   . Irritable bowel syndrome (IBS)   . Joint pain   . Joint swelling   . LIVER FUNCTION TESTS, ABNORMAL, HX OF 10/29/2007  . Lumbar disc disease   . Migraine   . Obesity   . OSTEOPENIA 10/29/2007  . Osteoporosis   . Presbyacusis 01/04/2010  . Rosacea   . Seasonal allergies    takes Clarinex daily  . Vertigo    hx of  . Vitamin D deficiency    takes Vit D daily    Past Surgical History:  Procedure Laterality Date  . ANTERIOR CERVICAL DECOMP/DISCECTOMY FUSION  08/02/2011   Procedure: ANTERIOR CERVICAL DECOMPRESSION/DISCECTOMY FUSION 3 LEVELS;  Surgeon: Elaina Hoops, MD;  Location: [facility];  Service: Neurosurgery;  Laterality: N/A;  Cervical Three-Four, Cervical Four-Five,  Cervical Five-Six  Anterior Cervical Decompression Fusion   . COLONOSCOPY  2001/2013  . ESOPHAGOGASTRODUODENOSCOPY    . POLYPECTOMY    . TONSILLECTOMY    . TONSILLECTOMY  at age 15    There were no vitals filed for this visit.      Subjective Assessment - 04/06/16 1539    Subjective Pt denied falls since last visit. Pt reported she didn't sleep last night, as a bat got in her house. Pt reports she has a cold and hasn't been performing HEP due to congestion.    Pertinent History HTN, hyperlipidemia, GERD, hx of Cx ACDF in 2013, immature B cataracts, osteoporosis, Lx disc disease.   Patient Stated Goals To have better balance, decr. pressure in head   Currently in Pain? No/denies                          Vestibular Treatment/Exercise - 04/06/16 0001      Vestibular Treatment/Exercise   Vestibular Treatment Provided Gaze   Gaze Exercises X1 Viewing Horizontal;X1 Viewing Vertical     X1 Viewing Horizontal   Foot Position Seated, and standing:Feet apart, feet together and tandem on compliant and non-compliant surfaces.    Time --  20-30 sec.    Reps --  1 rep per activity.   Comments Pt noted to experience incr. postural sway and dizziness during tandem on pillows (2-3/10 dizziness)     X1 Viewing Vertical   Foot Position Seated and standing: feet apart, feet together, tandem on compliant and non-compliant surfaces.   Time --  20-30 sec.   Reps 1  per activity   Comments Incr. postural sway and dizziness during tandem on pillows.     See HEP for details.         Balance Exercises - 04/06/16 1614      Balance Exercises: Standing   Standing Eyes Closed Narrow base of support (BOS);Foam/compliant surface  3x5 reps with head turns/nods with S for safety. See HEP for details.    Rockerboard Anterior/posterior;Lateral;Head turns;EO;EC;10 seconds;30 seconds;10 reps   Other Standing Exercises Performed in // bars with min gaurd to min A for support. Cues to  use hips to improve ant. weight shifting. Pt performed ball toss/catch on rockerboard with min guard to min A x30 reps.            PT Education - 04/06/16 1630    Education provided Yes   Education Details PT reviewed pt's balance and gaze stab. HEP and progressed as tolerated. PT again reiterated the importance of discussing congestion/pressure with MD.          PT Short Term Goals - 03/30/16 1514      PT SHORT TERM GOAL #1   Title same as LTGs           PT Long Term Goals - 04/03/16 0929      PT LONG TERM GOAL #1   Title Pt will be IND in HEP to reduce dizziness and improve balance. TARGET DATE: 04/27/16   Status New     PT LONG TERM GOAL #2   Title Pt will report dizziness </=2/10 in order to safely amb. and perform ADLs.   Baseline SOT WNL on 04/03/16 and FGA: 27/30   Status New     PT LONG TERM GOAL #3   Title Perform FGA and SOT and writes goals as indicated.   Status Achieved     PT LONG TERM GOAL #4   Title Pt will amb. 1000' over even/uneven terrain, IND, while performing head turns without LOB in order to improve functional mobility.    Status New     PT LONG TERM GOAL #5   Title Pt will improve FGA score to 30/30 to reduce falls risk.    Status New               Plan - 04/06/16 1633    Clinical Impression Statement Pt demonstrated progress, as she was able to tolerate progression to standing in tandem stance on pillows during gaze stabilization. Pt continues to experience incr. postural sway during activities which require incr. vestibular input. Continue with POC.    Rehab Potential Good   Clinical Impairments Affecting Rehab Potential Hx of Cx ACDF   PT Frequency 2x / week   PT Duration 4 weeks   PT Treatment/Interventions ADLs/Self Care Home Management;Canalith Repostioning;Neuromuscular re-education;Balance training;Therapeutic exercise;Therapeutic activities;Functional mobility training;Gait training;DME Instruction;Patient/family  education;Vestibular;Manual techniques   PT Next Visit Plan Discuss reducing frequency to 1x/week based on progress. High level balance with head turns over compliant surfaces.   PT Home Exercise Plan Gaze stab. and balance HEP.   Consulted and Agree with Plan of Care Patient      Patient will benefit from skilled therapeutic  intervention in order to improve the following deficits and impairments:  Abnormal gait, Impaired flexibility, Decreased range of motion, Dizziness, Decreased mobility, Decreased balance, Decreased knowledge of use of DME, Decreased strength  Visit Diagnosis: Dizziness and giddiness  Other abnormalities of gait and mobility     Problem List Patient Active Problem List   Diagnosis Date Noted  . Anterior neck pain 03/09/2016  . Vertigo 03/09/2016  . PAC (premature atrial contraction) 04/29/2015  . History of colonic polyps 04/29/2015  . Cervical radiculitis 12/21/2014  . Pain in the chest 04/15/2014  . Osteoporosis 02/27/2011  . Lumbar disc disease 02/27/2011  . Back pain 02/27/2011  . Preventative health care 02/24/2011  . Presbyacusis 01/04/2010  . LIVER FUNCTION TESTS, ABNORMAL, HX OF 10/29/2007  . HYPERLIPIDEMIA 10/28/2007  . ANXIETY 10/28/2007  . Essential hypertension 10/28/2007  . GERD 10/28/2007  . DIVERTICULOSIS, COLON 10/28/2007    Miller,Jennifer L 04/06/2016, 4:35 PM  [facility] [address] [place], [place], [address] Phone: [phone]   Fax:  [phone]  Name: Heather Merritt MRN: [id number] Date of Birth: 07/20/1955   Geoffry Paradise, PT,DPT 04/06/16 4:36 PM Phone: [phone] Fax: [phone]

## 2016-04-06 NOTE — Patient Instructions (Signed)
Gaze Stabilization: Tip Card  1.Target must remain in focus, not blurry, and appear stationary while head is in motion. 2.Perform exercises with small head movements (45 to either side of midline). 3.Increase speed of head motion so long as target is in focus. 4.If you wear eyeglasses, be sure you can see target through lens (therapist will give specific instructions for bifocal / progressive lenses). 5.These exercises may provoke dizziness or nausea. Work through these symptoms. If too dizzy, slow head movement slightly. Rest between each exercise. 6.Exercises demand concentration; avoid distractions. 7.For safety, perform standing exercises close to a counter, wall, corner, or next to someone.  Gaze Stabilization: Standing Feet Heel-Toe "Tandem"    Feet in full heel-toe position, while standing on a pillow. Keeping eyes on target on wall 3-5 feet away, tilt head down 15-30 and move head side to side for __20-30__ seconds. Repeat while moving head up and down for _20-30___ seconds. Do __2__ sessions per day.  Copyright  VHI. All rights reserved.   Feet Together (Compliant Surface) Head Motion - Eyes Closed    Stand on compliant surface: __pillow/cushion______ with feet together. Close eyes and move head slowly, up and down 5 times and side to side 5 times. Repeat __3__ times per session. Do __1__ sessions per day.  Copyright  VHI. All rights reserved.

## 2016-04-09 ENCOUNTER — Ambulatory Visit: Payer: Managed Care, Other (non HMO) | Admitting: Physical Therapy

## 2016-04-12 ENCOUNTER — Ambulatory Visit: Payer: Managed Care, Other (non HMO) | Admitting: Physical Therapy

## 2016-04-16 ENCOUNTER — Encounter: Payer: Managed Care, Other (non HMO) | Admitting: Physical Therapy

## 2016-04-17 ENCOUNTER — Ambulatory Visit: Payer: Managed Care, Other (non HMO) | Admitting: Physical Therapy

## 2016-04-17 DIAGNOSIS — R42 Dizziness and giddiness: Secondary | ICD-10-CM

## 2016-04-17 DIAGNOSIS — R2689 Other abnormalities of gait and mobility: Secondary | ICD-10-CM

## 2016-04-17 NOTE — Therapy (Signed)
[facility] [address] [place], [place], [address] Phone: [phone]   Fax:  [phone]  Physical Therapy Treatment  Patient Details  Name: Heather Merritt MRN: [id number] Date of Birth: [birth year] Referring Provider: Dr. Jenny Reichmann  Encounter Date: 04/17/2016      PT End of Session - 04/17/16 1446    Visit Number 4   Number of Visits 9   Date for PT Re-Evaluation 04/29/16   Authorization Type [employer] Managed-40 visit limit combined per Lattie Haw   PT Start Time 1400   PT Stop Time 1440   PT Time Calculation (min) 40 min   Equipment Utilized During Treatment Gait belt   Activity Tolerance Patient tolerated treatment well   Behavior During Therapy [facility] for tasks assessed/performed      Past Medical History:  Diagnosis Date  . Allergy    SEASONAL  . ANXIETY 10/28/2007  . Arthritis   . Cataracts, bilateral    immature  . Chronic back pain   . DIVERTICULOSIS, COLON 10/28/2007  . Dry skin    red patch on right knee area  . GERD 10/28/2007   takes Prevacid daily  . H/O hiatal hernia   . Hx of colonic polyps   . HYPERLIPIDEMIA 10/28/2007   unable to take meds d/t allergies  . HYPERTENSION 10/28/2007   takes Diovan daily  . Internal hemorrhoids   . Irritable bowel syndrome (IBS)   . Joint pain   . Joint swelling   . LIVER FUNCTION TESTS, ABNORMAL, HX OF 10/29/2007  . Lumbar disc disease   . Migraine   . Obesity   . OSTEOPENIA 10/29/2007  . Osteoporosis   . Presbyacusis 01/04/2010  . Rosacea   . Seasonal allergies    takes Clarinex daily  . Vertigo    hx of  . Vitamin D deficiency    takes Vit D daily    Past Surgical History:  Procedure Laterality Date  . ANTERIOR CERVICAL DECOMP/DISCECTOMY FUSION  08/02/2011   Procedure: ANTERIOR CERVICAL DECOMPRESSION/DISCECTOMY FUSION 3 LEVELS;  Surgeon: Elaina Hoops, MD;  Location: [facility];  Service: Neurosurgery;  Laterality: N/A;  Cervical Three-Four, Cervical  Four-Five, Cervical Five-Six  Anterior Cervical Decompression Fusion   . COLONOSCOPY  2001/2013  . ESOPHAGOGASTRODUODENOSCOPY    . POLYPECTOMY    . TONSILLECTOMY    . TONSILLECTOMY  at age 27    There were no vitals filed for this visit.      Subjective Assessment - 04/17/16 1401    Subjective has been sick so unable to do exercises, reports increased imbalance.  sickness is much better; still has some mild congestion.  has had some dizziness, no spinning and described more as "off balance."   Pertinent History HTN, hyperlipidemia, GERD, hx of Cx ACDF in 2013, immature B cataracts, osteoporosis, Lx disc disease.   Patient Stated Goals To have better balance, decr. pressure in head   Currently in Pain? No/denies                          Vestibular Treatment/Exercise - 04/17/16 1412      Vestibular Treatment/Exercise   Vestibular Treatment Provided Gaze   Gaze Exercises X1 Viewing Horizontal;X1 Viewing Vertical     X1 Viewing Horizontal   Foot Position feet together on compliant   Time --  30 sec   Reps 1     X1 Viewing Vertical   Foot Position feet together  on compliant surface   Time --  30 sec   Reps 1            Balance Exercises - 04/17/16 1441      Balance Exercises: Standing   Standing Eyes Closed Narrow base of support (BOS);Foam/compliant surface;Head turns   Rockerboard Anterior/posterior;Lateral;Head turns;10 reps;EC;10 seconds  EC 10 sec x 3 reps each   Gait with Head Turns Forward  with/without ball toss, mild increase in imbalance   Partial Tandem Stance Foam/compliant surface;Eyes closed;4 reps  head turns/nods x2 reps each foot in front             PT Short Term Goals - 03/30/16 1514      PT SHORT TERM GOAL #1   Title same as LTGs           PT Long Term Goals - 04/03/16 0929      PT LONG TERM GOAL #1   Title Pt will be IND in HEP to reduce dizziness and improve balance. TARGET DATE: 04/27/16   Status New      PT LONG TERM GOAL #2   Title Pt will report dizziness </=2/10 in order to safely amb. and perform ADLs.   Baseline SOT WNL on 04/03/16 and FGA: 27/30   Status New     PT LONG TERM GOAL #3   Title Perform FGA and SOT and writes goals as indicated.   Status Achieved     PT LONG TERM GOAL #4   Title Pt will amb. 1000' over even/uneven terrain, IND, while performing head turns without LOB in order to improve functional mobility.    Status New     PT LONG TERM GOAL #5   Title Pt will improve FGA score to 30/30 to reduce falls risk.    Status New               Plan - 04/17/16 1527    Clinical Impression Statement Pt tolerated all exercises well today with min c/o imbalance noted, no true LOB with exercises.  Progressed corner balance exercises.  Pt to be consistent with HEP x 1 week and will return next week to further assess symptoms.  Recommended moving glasses down to help with depth perception with stairs as progressive lenses seem to impact stairs.    PT Treatment/Interventions ADLs/Self Care Home Management;Canalith Repostioning;Neuromuscular re-education;Balance training;Therapeutic exercise;Therapeutic activities;Functional mobility training;Gait training;DME Instruction;Patient/family education;Vestibular;Manual techniques   PT Next Visit Plan High level balance with head turns over compliant surfaces.; see how pt did with stairs and HEP   Consulted and Agree with Plan of Care Patient      Patient will benefit from skilled therapeutic intervention in order to improve the following deficits and impairments:  Abnormal gait, Impaired flexibility, Decreased range of motion, Dizziness, Decreased mobility, Decreased balance, Decreased knowledge of use of DME, Decreased strength  Visit Diagnosis: Dizziness and giddiness  Other abnormalities of gait and mobility     Problem List Patient Active Problem List   Diagnosis Date Noted  . Anterior neck pain 03/09/2016  . Vertigo  03/09/2016  . PAC (premature atrial contraction) 04/29/2015  . History of colonic polyps 04/29/2015  . Cervical radiculitis 12/21/2014  . Pain in the chest 04/15/2014  . Osteoporosis 02/27/2011  . Lumbar disc disease 02/27/2011  . Back pain 02/27/2011  . Preventative health care 02/24/2011  . Presbyacusis 01/04/2010  . LIVER FUNCTION TESTS, ABNORMAL, HX OF 10/29/2007  . HYPERLIPIDEMIA 10/28/2007  . ANXIETY 10/28/2007  .  Essential hypertension 10/28/2007  . GERD 10/28/2007  . DIVERTICULOSIS, COLON 10/28/2007      Laureen Abrahams, PT, DPT 04/17/16 3:29 PM    [facility] [address] [place], [place], [address] Phone: [phone]   Fax:  [phone]  Name: Heather Merritt MRN: [id number] Date of Birth: [birth year]

## 2016-04-19 ENCOUNTER — Encounter: Payer: Managed Care, Other (non HMO) | Admitting: Physical Therapy

## 2016-04-19 ENCOUNTER — Encounter: Payer: Managed Care, Other (non HMO) | Admitting: Internal Medicine

## 2016-04-24 ENCOUNTER — Encounter: Payer: Managed Care, Other (non HMO) | Admitting: Physical Therapy

## 2016-04-25 ENCOUNTER — Ambulatory Visit: Payer: Managed Care, Other (non HMO) | Admitting: Physical Therapy

## 2016-04-25 DIAGNOSIS — R2689 Other abnormalities of gait and mobility: Secondary | ICD-10-CM

## 2016-04-25 DIAGNOSIS — R42 Dizziness and giddiness: Secondary | ICD-10-CM

## 2016-04-25 NOTE — Therapy (Addendum)
[facility] [address] [place], [place], [address] Phone: [phone]   Fax:  [phone]  Physical Therapy Treatment  Patient Details  Name: Heather Merritt MRN: [id number] Date of Birth: Apr 19, 1955 Referring Provider: Dr. Jenny Reichmann  Encounter Date: 04/25/2016      PT End of Session - 04/25/16 1004    Visit Number 5   Authorization Type [employer] Managed-40 visit limit combined per Lattie Haw   PT Start Time [phone]   PT Stop Time 1001  d/c visit   PT Time Calculation (min) 35 min   Activity Tolerance Patient tolerated treatment well   Behavior During Therapy [facility] for tasks assessed/performed      Past Medical History:  Diagnosis Date  . Allergy    SEASONAL  . ANXIETY 10/28/2007  . Arthritis   . Cataracts, bilateral    immature  . Chronic back pain   . DIVERTICULOSIS, COLON 10/28/2007  . Dry skin    red patch on right knee area  . GERD 10/28/2007   takes Prevacid daily  . H/O hiatal hernia   . Hx of colonic polyps   . HYPERLIPIDEMIA 10/28/2007   unable to take meds d/t allergies  . HYPERTENSION 10/28/2007   takes Diovan daily  . Internal hemorrhoids   . Irritable bowel syndrome (IBS)   . Joint pain   . Joint swelling   . LIVER FUNCTION TESTS, ABNORMAL, HX OF 10/29/2007  . Lumbar disc disease   . Migraine   . Obesity   . OSTEOPENIA 10/29/2007  . Osteoporosis   . Presbyacusis 01/04/2010  . Rosacea   . Seasonal allergies    takes Clarinex daily  . Vertigo    hx of  . Vitamin D deficiency    takes Vit D daily    Past Surgical History:  Procedure Laterality Date  . ANTERIOR CERVICAL DECOMP/DISCECTOMY FUSION  08/02/2011   Procedure: ANTERIOR CERVICAL DECOMPRESSION/DISCECTOMY FUSION 3 LEVELS;  Surgeon: Elaina Hoops, MD;  Location: [facility];  Service: Neurosurgery;  Laterality: N/A;  Cervical Three-Four, Cervical Four-Five, Cervical Five-Six  Anterior Cervical Decompression Fusion   . COLONOSCOPY  2001/2013  .  ESOPHAGOGASTRODUODENOSCOPY    . POLYPECTOMY    . TONSILLECTOMY    . TONSILLECTOMY  at age 42    There were no vitals filed for this visit.      Subjective Assessment - 04/25/16 0928    Subjective noticed a difference with stairs-not dizzy if she moves the glasses down. no dizziness.  rates dizziness 1-2/10 past couple days.   Pertinent History HTN, hyperlipidemia, GERD, hx of Cx ACDF in 2013, immature B cataracts, osteoporosis, Lx disc disease.   Patient Stated Goals To have better balance, decr. pressure in head   Currently in Pain? No/denies            [facility] PT Assessment - 04/25/16 0931      Observation/Other Assessments   Focus on Therapeutic Outcomes (FOTO)  [place] 10     Ambulation/Gait   Ambulation/Gait Yes   Ambulation/Gait Assistance 7: Independent   Ambulation Distance (Feet) 1000 Feet   Assistive device None   Gait Pattern Within Functional Limits   Ambulation Surface Level;Unlevel;Indoor;Outdoor;Paved;Gravel;Grass     Functional Gait  Assessment   Gait assessed  Yes   Gait Level Surface Walks 20 ft in less than 5.5 sec, no assistive devices, good speed, no evidence for imbalance, normal gait pattern, deviates no more than 6 in outside of the 12  in walkway width.   Change in Gait Speed Able to smoothly change walking speed without loss of balance or gait deviation. Deviate no more than 6 in outside of the 12 in walkway width.   Gait with Horizontal Head Turns Performs head turns smoothly with slight change in gait velocity (eg, minor disruption to smooth gait path), deviates 6-10 in outside 12 in walkway width, or uses an assistive device.   Gait with Vertical Head Turns Performs head turns with no change in gait. Deviates no more than 6 in outside 12 in walkway width.   Gait and Pivot Turn Pivot turns safely within 3 sec and stops quickly with no loss of balance.   Step Over Obstacle Is able to step over 2 stacked shoe boxes taped together (9 in total height) without  changing gait speed. No evidence of imbalance.   Gait with Narrow Base of Support Is able to ambulate for 10 steps heel to toe with no staggering.   Gait with Eyes Closed Walks 20 ft, no assistive devices, good speed, no evidence of imbalance, normal gait pattern, deviates no more than 6 in outside 12 in walkway width. Ambulates 20 ft in less than 7 sec.   Ambulating Backwards Walks 20 ft, no assistive devices, good speed, no evidence for imbalance, normal gait   Steps Alternating feet, no rail.   Total Score 29                      Vestibular Treatment/Exercise - 04/25/16 0958      Vestibular Treatment/Exercise   Vestibular Treatment Provided Gaze   Gaze Exercises X1 Viewing Horizontal;X1 Viewing Vertical     X1 Viewing Horizontal   Foot Position partial tandem on compliant; and ambulation 40' x2   Time --  30 sec; 40'x2   Reps 2   Comments no increase in symptoms     X1 Viewing Vertical   Foot Position partial tandem on compliant; and ambulation 40' x2   Time --  30 sec; 40'x2   Reps 2   Comments no increase in symptoms            Balance Exercises - 04/25/16 0959      Balance Exercises: Standing   Standing Eyes Closed Narrow base of support (BOS);Foam/compliant surface;Head turns   Partial Tandem Stance Foam/compliant surface;Eyes closed  head turns x10 each direction           PT Education - 04/25/16 1003    Education provided Yes   Education Details gaze x 1 walking, continue HEP to maximize function   Person(s) Educated Patient   Methods Explanation;Demonstration;Handout   Comprehension Verbalized understanding          PT Short Term Goals - 03/30/16 1514      PT SHORT TERM GOAL #1   Title same as LTGs           PT Long Term Goals - 04/25/16 1005      PT LONG TERM GOAL #1   Title Pt will be IND in HEP to reduce dizziness and improve balance. TARGET DATE: 04/27/16   Status Achieved     PT LONG TERM GOAL #2   Title Pt will  report dizziness </=2/10 in order to safely amb. and perform ADLs.   Status Achieved     PT LONG TERM GOAL #3   Title Perform FGA and SOT and writes goals as indicated.   Status Achieved  PT LONG TERM GOAL #4   Title Pt will amb. 1000' over even/uneven terrain, IND, while performing head turns without LOB in order to improve functional mobility.    Status Achieved     PT LONG TERM GOAL #5   Title Pt will improve FGA score to 30/30 to reduce falls risk.    Baseline FGA improved to 29/30   Status Not Met               Plan - 04/25/16 1006    Clinical Impression Statement Pt has returned to regular activitiy and report minimal symptoms.  Added progression of gaze today to perform with ambulation-no increase in symptoms but difficulty with maintaining eyes on target with horizontal turns.  Agree to hold PT x 30 days and pt to return if symptoms return.     PT Treatment/Interventions ADLs/Self Care Home Management;Canalith Repostioning;Neuromuscular re-education;Balance training;Therapeutic exercise;Therapeutic activities;Functional mobility training;Gait training;DME Instruction;Patient/family education;Vestibular;Manual techniques   PT Next Visit Plan hold x 30 days, d/c if pt doesn't return otherwise reassess   PT Home Exercise Plan Gaze stab. and balance HEP.   Consulted and Agree with Plan of Care Patient      Patient will benefit from skilled therapeutic intervention in order to improve the following deficits and impairments:  Abnormal gait, Impaired flexibility, Decreased range of motion, Dizziness, Decreased mobility, Decreased balance, Decreased knowledge of use of DME, Decreased strength  Visit Diagnosis: Dizziness and giddiness  Other abnormalities of gait and mobility     Problem List Patient Active Problem List   Diagnosis Date Noted  . Anterior neck pain 03/09/2016  . Vertigo 03/09/2016  . PAC (premature atrial contraction) 04/29/2015  . History of  colonic polyps 04/29/2015  . Cervical radiculitis 12/21/2014  . Pain in the chest 04/15/2014  . Osteoporosis 02/27/2011  . Lumbar disc disease 02/27/2011  . Back pain 02/27/2011  . Preventative health care 02/24/2011  . Presbyacusis 01/04/2010  . LIVER FUNCTION TESTS, ABNORMAL, HX OF 10/29/2007  . HYPERLIPIDEMIA 10/28/2007  . ANXIETY 10/28/2007  . Essential hypertension 10/28/2007  . GERD 10/28/2007  . DIVERTICULOSIS, COLON 10/28/2007      Laureen Abrahams, PT, DPT 04/25/16 10:10 AM    [facility] [address] [place], [place], [address] Phone: [phone]   Fax:  [phone]  Name: Heather Merritt MRN: [id number] Date of Birth: 12/17/1955         PHYSICAL THERAPY DISCHARGE SUMMARY  Visits from Start of Care: 5  Current functional level related to goals / functional outcomes: See above   Remaining deficits: n/a   Education / Equipment: HEP  Plan: Patient agrees to discharge.  Patient goals were met. Patient is being discharged due to meeting the stated rehab goals.  ?????     Laureen Abrahams, PT, DPT 05/29/16 8:58 AM  [facility] [address] [place], [place] [address]  [phone] (office) [phone] (fax)

## 2016-04-25 NOTE — Patient Instructions (Signed)
Gaze Stabilization: Walking Toward Target    Keeping eyes on target, walk forward with target in your hand, moving head up and down for __30-40__ feet. Repeat with head tilted down 15-30, moving head side to side for __30-40__ feet. Do _2-3___ sessions per day. Repeat using target on pattern background (walking outside)  Copyright  VHI. All rights reserved.

## 2016-04-26 ENCOUNTER — Encounter: Payer: Managed Care, Other (non HMO) | Admitting: Physical Therapy

## 2016-04-27 ENCOUNTER — Encounter: Payer: Managed Care, Other (non HMO) | Admitting: Physical Therapy

## 2016-05-23 ENCOUNTER — Telehealth: Payer: Self-pay | Admitting: Emergency Medicine

## 2016-05-23 DIAGNOSIS — E559 Vitamin D deficiency, unspecified: Secondary | ICD-10-CM

## 2016-05-23 DIAGNOSIS — Z0000 Encounter for general adult medical examination without abnormal findings: Secondary | ICD-10-CM

## 2016-05-23 NOTE — Telephone Encounter (Signed)
error 

## 2016-05-23 NOTE — Telephone Encounter (Signed)
Pt called and wants to know if you can put her labs in so she can come in and have then drawn a few days before her appt. She also said her GYN wants her to have her vitamin D levels drawn if you could put that order in as well. Please advise thanks.

## 2016-05-25 NOTE — Telephone Encounter (Signed)
Please Advise

## 2016-05-25 NOTE — Addendum Note (Signed)
Addended by: Biagio Borg on: 05/25/2016 12:51 PM   Modules accepted: Orders

## 2016-05-25 NOTE — Telephone Encounter (Signed)
Ok, this has been done 

## 2016-05-28 NOTE — Telephone Encounter (Signed)
Spoke with pt to inform.  

## 2016-05-31 ENCOUNTER — Other Ambulatory Visit (INDEPENDENT_AMBULATORY_CARE_PROVIDER_SITE_OTHER): Payer: Managed Care, Other (non HMO)

## 2016-05-31 DIAGNOSIS — Z0000 Encounter for general adult medical examination without abnormal findings: Secondary | ICD-10-CM

## 2016-05-31 DIAGNOSIS — E559 Vitamin D deficiency, unspecified: Secondary | ICD-10-CM | POA: Diagnosis not present

## 2016-05-31 DIAGNOSIS — R7989 Other specified abnormal findings of blood chemistry: Secondary | ICD-10-CM

## 2016-05-31 LAB — LIPID PANEL
Cholesterol: 288 mg/dL — ABNORMAL HIGH (ref 0–200)
HDL: 40.4 mg/dL (ref >39.00)
NonHDL: 247.98
Total CHOL/HDL Ratio: 7
Triglycerides: 208 mg/dL — ABNORMAL HIGH (ref 0.0–149.0)
VLDL: 41.6 mg/dL — ABNORMAL HIGH (ref 0.0–40.0)

## 2016-05-31 LAB — URINALYSIS, ROUTINE W REFLEX MICROSCOPIC
Bilirubin Urine: NEGATIVE
Ketones, ur: NEGATIVE
Leukocytes, UA: NEGATIVE
Nitrite: NEGATIVE
Specific Gravity, Urine: 1.025 (ref 1.000–1.030)
Total Protein, Urine: NEGATIVE
Urine Glucose: NEGATIVE
Urobilinogen, UA: 0.2 (ref 0.0–1.0)
pH: 5.5 (ref 5.0–8.0)

## 2016-05-31 LAB — HEPATIC FUNCTION PANEL
ALT: 35 U/L (ref 0–35)
AST: 24 U/L (ref 0–37)
Albumin: 4.2 g/dL (ref 3.5–5.2)
Alkaline Phosphatase: 59 U/L (ref 39–117)
Bilirubin, Direct: 0.1 mg/dL (ref 0.0–0.3)
Total Bilirubin: 0.5 mg/dL (ref 0.2–1.2)
Total Protein: 7 g/dL (ref 6.0–8.3)

## 2016-05-31 LAB — CBC WITH DIFFERENTIAL/PLATELET
Basophils Absolute: 0 10*3/uL (ref 0.0–0.1)
Basophils Relative: 0.8 % (ref 0.0–3.0)
Eosinophils Absolute: 0.1 10*3/uL (ref 0.0–0.7)
Eosinophils Relative: 2.2 % (ref 0.0–5.0)
HCT: 40.9 % (ref 36.0–46.0)
Hemoglobin: 14 g/dL (ref 12.0–15.0)
Lymphocytes Relative: 35.3 % (ref 12.0–46.0)
Lymphs Abs: 1.4 10*3/uL (ref 0.7–4.0)
MCHC: 34.2 g/dL (ref 30.0–36.0)
MCV: 89.9 fl (ref 78.0–100.0)
Monocytes Absolute: 0.4 10*3/uL (ref 0.1–1.0)
Monocytes Relative: 10.2 % (ref 3.0–12.0)
Neutro Abs: 2 10*3/uL (ref 1.4–7.7)
Neutrophils Relative %: 51.5 % (ref 43.0–77.0)
Platelets: 265 10*3/uL (ref 150.0–400.0)
RBC: 4.55 Mil/uL (ref 3.87–5.11)
RDW: 13.1 % (ref 11.5–15.5)
WBC: 3.9 10*3/uL — ABNORMAL LOW (ref 4.0–10.5)

## 2016-05-31 LAB — VITAMIN D 25 HYDROXY (VIT D DEFICIENCY, FRACTURES): VITD: 36.15 ng/mL (ref 30.00–100.00)

## 2016-05-31 LAB — BASIC METABOLIC PANEL
BUN: 18 mg/dL (ref 6–23)
CO2: 30 mEq/L (ref 19–32)
Calcium: 9.4 mg/dL (ref 8.4–10.5)
Chloride: 101 mEq/L (ref 96–112)
Creatinine, Ser: 0.71 mg/dL (ref 0.40–1.20)
GFR: 88.37 mL/min (ref >60.00)
Glucose, Bld: 98 mg/dL (ref 70–99)
Potassium: 3.9 mEq/L (ref 3.5–5.1)
Sodium: 138 mEq/L (ref 135–145)

## 2016-05-31 LAB — TSH: TSH: 3.35 u[IU]/mL (ref 0.35–4.50)

## 2016-05-31 LAB — LDL CHOLESTEROL, DIRECT: Direct LDL: 181 mg/dL

## 2016-06-05 ENCOUNTER — Encounter: Payer: Self-pay | Admitting: Internal Medicine

## 2016-06-05 ENCOUNTER — Ambulatory Visit (INDEPENDENT_AMBULATORY_CARE_PROVIDER_SITE_OTHER): Payer: Managed Care, Other (non HMO) | Admitting: Internal Medicine

## 2016-06-05 VITALS — BP 126/82 | HR 75 | Temp 98.1°F | Ht 63.5 in | Wt 186.0 lb

## 2016-06-05 DIAGNOSIS — R3129 Other microscopic hematuria: Secondary | ICD-10-CM | POA: Insufficient documentation

## 2016-06-05 DIAGNOSIS — I10 Essential (primary) hypertension: Secondary | ICD-10-CM

## 2016-06-05 DIAGNOSIS — E785 Hyperlipidemia, unspecified: Secondary | ICD-10-CM

## 2016-06-05 DIAGNOSIS — E559 Vitamin D deficiency, unspecified: Secondary | ICD-10-CM

## 2016-06-05 DIAGNOSIS — E1169 Type 2 diabetes mellitus with other specified complication: Secondary | ICD-10-CM

## 2016-06-05 DIAGNOSIS — Z0000 Encounter for general adult medical examination without abnormal findings: Secondary | ICD-10-CM | POA: Diagnosis not present

## 2016-06-05 DIAGNOSIS — Z23 Encounter for immunization: Secondary | ICD-10-CM | POA: Diagnosis not present

## 2016-06-05 MED ORDER — ATORVASTATIN CALCIUM 10 MG PO TABS: 10.0000 mg | ORAL_TABLET | ORAL | 3 refills | Status: DC

## 2016-06-05 MED ORDER — AMLODIPINE BESYLATE 2.5 MG PO TABS: 2.5000 mg | ORAL_TABLET | Freq: Every day | ORAL | 3 refills | Status: DC

## 2016-06-05 MED ORDER — VALSARTAN-HYDROCHLOROTHIAZIDE 160-12.5 MG PO TABS: 1.0000 | ORAL_TABLET | Freq: Every day | ORAL | 3 refills | Status: DC

## 2016-06-05 MED ORDER — LANSOPRAZOLE 30 MG PO CPDR: 30.0000 mg | DELAYED_RELEASE_CAPSULE | Freq: Every day | ORAL | 3 refills | Status: DC

## 2016-06-05 NOTE — Patient Instructions (Addendum)
You had the Tetanus (Tdap ) shot today  Please take all new medication as prescribed - the lipitor every other day  Please continue all other medications as before, and refills have been done if requested.  Please have the pharmacy call with any other refills you may need.  Please continue your efforts at being more active, low cholesterol diet, and weight control.  You are otherwise up to date with prevention measures today.  Please keep your appointments with your specialists as you may have planned  You will be contacted regarding the referral for: CT scan to check for renal stones or anything else to cause the blood in the urine  Please call if you change your mind about the Urology referral as well  Please return in 1 year for your yearly visit, or sooner if needed, with Lab testing done 3-5 days before

## 2016-06-05 NOTE — Progress Notes (Signed)
Subjective:    Patient ID: Heather Merritt, female    DOB: 01/17/1956, 61 y.o.   MRN: [id number]   HPI    Here for wellness and f/u;  Overall doing ok;  Pt denies Chest pain, worsening SOB, DOE, wheezing, orthopnea, PND, worsening LE edema, palpitations, dizziness or syncope.  Pt denies neurological change such as new headache, facial or extremity weakness.  Pt denies polydipsia, polyuria, or low sugar symptoms. Pt states overall good compliance with treatment and medications, good tolerability, and has been trying to follow appropriate diet.  Pt denies worsening depressive symptoms, suicidal ideation or panic. No fever, night sweats, wt loss, loss of appetite, or other constitutional symptoms.  Pt states good ability with ADL's, has low fall risk, home safety reviewed and adequate, no other significant changes in hearing or vision, and only occasionally active with exercise, but plans to be more active now. BP Readings from Last 3 Encounters:  06/05/16 126/82  03/09/16 140/82  06/28/15 (!) 118/52   Wt Readings from Last 3 Encounters:  06/05/16 186 lb (84.4 kg)  03/09/16 187 lb (84.8 kg)  06/28/15 185 lb (83.9 kg)  Left knee no longer painful after accidental fall dec 2017.  Gets pap and mammog with OB/gyn.  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   Willing to start the liptior 10 qod since she has myalgias with 10 qd before. Has had no overt urinary blood loss, but has hx of renal stones with last attack about 2014.  Believes she passed it without further attacks.  No CT at the time or imaging or urology.  Curently pt states will only accept CT imaging to r/o stone.   Past Medical History:  Diagnosis Date  . Allergy    SEASONAL  . ANXIETY 10/28/2007  . Arthritis   . Cataracts, bilateral    immature  . Chronic back pain   . DIVERTICULOSIS, COLON 10/28/2007  . Dry skin    red patch  on right knee area  . GERD 10/28/2007   takes Prevacid daily  . H/O hiatal hernia   . Hx of colonic polyps   . HYPERLIPIDEMIA 10/28/2007   unable to take meds d/t allergies  . HYPERTENSION 10/28/2007   takes Diovan daily  . Internal hemorrhoids   . Irritable bowel syndrome (IBS)   . Joint pain   . Joint swelling   . LIVER FUNCTION TESTS, ABNORMAL, HX OF 10/29/2007  . Lumbar disc disease   . Migraine   . Obesity   . OSTEOPENIA 10/29/2007  . Osteoporosis   . Presbyacusis 01/04/2010  . Rosacea   . Seasonal allergies    takes Clarinex daily  . Vertigo    hx of  . Vitamin D deficiency    takes Vit D daily   Past Surgical History:  Procedure Laterality Date  . ANTERIOR CERVICAL DECOMP/DISCECTOMY FUSION  08/02/2011   Procedure: ANTERIOR CERVICAL DECOMPRESSION/DISCECTOMY FUSION 3 LEVELS;  Surgeon: Elaina Hoops, MD;  Location: [facility];  Service: Neurosurgery;  Laterality: N/A;  Cervical Three-Four, Cervical Four-Five, Cervical Five-Six  Anterior Cervical Decompression Fusion   . COLONOSCOPY  2001/2013  . ESOPHAGOGASTRODUODENOSCOPY    . POLYPECTOMY    . TONSILLECTOMY    . TONSILLECTOMY  at age 71    reports that she has quit smoking. She has never used smokeless tobacco. She reports  that she drinks alcohol. She reports that she does not use drugs. family history includes Atrial fibrillation in her father and mother; Diabetes in her maternal grandmother; Heart disease in her mother; Hyperlipidemia in her mother; Hypothyroidism in her maternal grandmother and sister; Stroke in her mother. Allergies  Allergen Reactions  . Penicillins Hives, Itching and Rash  . Rosuvastatin Other (See Comments)    REACTION: muscle weakness  . Simvastatin Other (See Comments)    REACTION: muscle weakness  . Statins Other (See Comments)    Myalgias and weakness  . Sulfonamide Derivatives Nausea Only and Other (See Comments)    also abd pain  . Sulfa Antibiotics Hives   Current Outpatient  Prescriptions on File Prior to Visit  Medication Sig Dispense Refill  . aspirin 81 MG tablet Take 81 mg by mouth daily.      . Calcium Citrate (CITRACAL PO) Take 2 tablets by mouth daily.     . Cholecalciferol (VITAMIN D) 2000 UNITS CAPS Take 2,000 Units by mouth daily.    . fish oil-omega-3 fatty acids 1000 MG capsule Take 1 g by mouth daily.     Marland Kitchen ibuprofen (ADVIL,MOTRIN) 200 MG tablet Take 800 mg by mouth every 6 (six) hours as needed for moderate pain. Pain    . metroNIDAZOLE (METROGEL) 0.75 % gel Apply 1 application topically at bedtime.    . Multiple Vitamin (MULITIVITAMIN WITH MINERALS) TABS Take 1 tablet by mouth daily.    . pimecrolimus (ELIDEL) 1 % cream Apply 1 application topically every morning.    . vitamin E 400 UNIT capsule Take 400 Units by mouth daily.    Marland Kitchen desloratadine (CLARINEX) 5 MG tablet Take 1 tablet (5 mg total) by mouth daily. (Patient not taking: Reported on 06/05/2016) 90 tablet 3   No current facility-administered medications on file prior to visit.    Review of Systems Constitutional: Negative for increased diaphoresis, or other activity, appetite or siginficant weight change other than noted HENT: Negative for worsening hearing loss, ear pain, facial swelling, mouth sores and neck stiffness.   Eyes: Negative for other worsening pain, redness or visual disturbance.  Respiratory: Negative for choking or stridor Cardiovascular: Negative for other chest pain and palpitations.  Gastrointestinal: Negative for worsening diarrhea, blood in stool, or abdominal distention Genitourinary: Negative for hematuria, flank pain or change in urine volume.  Musculoskeletal: Negative for myalgias or other joint complaints.  Skin: Negative for other color change and wound or drainage.  Neurological: Negative for syncope and numbness. other than noted Hematological: Negative for adenopathy. or other swelling Psychiatric/Behavioral: Negative for hallucinations, SI, self-injury,  decreased concentration or other worsening agitation.  All other system neg per pt    Objective:   Physical Exam BP 126/82   Pulse 75   Temp 98.1 F (36.7 C)   Ht 5' 3.5" (1.613 m)   Wt 186 lb (84.4 kg)   SpO2 98%   BMI 32.43 kg/m  VS noted,  Constitutional: Pt is oriented to person, place, and time. Appears well-developed and well-nourished, in no significant distress Head: Normocephalic and atraumatic  Eyes: Conjunctivae and EOM are normal. Pupils are equal, round, and reactive to light Right Ear: External ear normal.  Left Ear: External ear normal Nose: Nose normal.  Mouth/Throat: Oropharynx is clear and moist  Neck: Normal range of motion. Neck supple. No JVD present. No tracheal deviation present or significant neck LA or mass Cardiovascular: Normal rate, regular rhythm, normal heart sounds and intact distal pulses.  Pulmonary/Chest: Effort normal and breath sounds without rales or wheezing  Abdominal: Soft. Bowel sounds are normal. NT. No HSM  Musculoskeletal: Normal range of motion. Exhibits no edema Lymphadenopathy: Has no cervical adenopathy.  Neurological: Pt is alert and oriented to person, place, and time. Pt has normal reflexes. No cranial nerve deficit. Motor grossly intact Skin: Skin is warm and dry. No rash noted or new ulcers Psychiatric:  Has normal mood and affect. Behavior is normal.  No other exam findings    Assessment & Plan:

## 2016-06-10 NOTE — Assessment & Plan Note (Signed)
BP Readings from Last 3 Encounters:  06/05/16 126/82  03/09/16 140/82  06/28/15 (!) 118/52  stable overall by history and exam, recent data reviewed with pt, and pt to continue medical treatment as before,  to f/u any worsening symptoms or concerns

## 2016-06-10 NOTE — Assessment & Plan Note (Signed)

## 2016-06-10 NOTE — Assessment & Plan Note (Signed)
[place] for lipitor qod as this is tolerable

## 2016-06-10 NOTE — Assessment & Plan Note (Signed)
Asympt, for CT abd renal stone protocol

## 2016-06-13 ENCOUNTER — Other Ambulatory Visit: Payer: Self-pay | Admitting: Internal Medicine

## 2016-06-13 ENCOUNTER — Ambulatory Visit
Admission: RE | Admit: 2016-06-13 | Discharge: 2016-06-13 | Disposition: A | Discharge status: Home / Self Care | Payer: Managed Care, Other (non HMO) | Source: Ambulatory Visit | Attending: Internal Medicine | Admitting: Internal Medicine

## 2016-06-13 DIAGNOSIS — R3129 Other microscopic hematuria: Secondary | ICD-10-CM

## 2016-06-18 ENCOUNTER — Encounter (HOSPITAL_BASED_OUTPATIENT_CLINIC_OR_DEPARTMENT_OTHER): Payer: Self-pay | Admitting: *Deleted

## 2016-06-18 ENCOUNTER — Emergency Department (HOSPITAL_BASED_OUTPATIENT_CLINIC_OR_DEPARTMENT_OTHER): Payer: Managed Care, Other (non HMO)

## 2016-06-18 ENCOUNTER — Emergency Department (HOSPITAL_BASED_OUTPATIENT_CLINIC_OR_DEPARTMENT_OTHER)
Admission: EM | Admit: 2016-06-18 | Discharge: 2016-06-19 | Disposition: A | Discharge status: Home / Self Care | Payer: Managed Care, Other (non HMO) | Attending: Emergency Medicine | Admitting: Emergency Medicine

## 2016-06-18 DIAGNOSIS — Z79899 Other long term (current) drug therapy: Secondary | ICD-10-CM | POA: Diagnosis not present

## 2016-06-18 DIAGNOSIS — R109 Unspecified abdominal pain: Secondary | ICD-10-CM | POA: Insufficient documentation

## 2016-06-18 DIAGNOSIS — Z87891 Personal history of nicotine dependence: Secondary | ICD-10-CM | POA: Diagnosis not present

## 2016-06-18 DIAGNOSIS — I10 Essential (primary) hypertension: Secondary | ICD-10-CM | POA: Insufficient documentation

## 2016-06-18 DIAGNOSIS — Z7982 Long term (current) use of aspirin: Secondary | ICD-10-CM | POA: Insufficient documentation

## 2016-06-18 DIAGNOSIS — R42 Dizziness and giddiness: Secondary | ICD-10-CM | POA: Diagnosis present

## 2016-06-18 NOTE — ED Triage Notes (Signed)
c/o multiple various symptoms: reports sudden onset at 1800 of bilateral neck submandibular tightness, up through ears, and facial pressure, sx have lessened, also reports altered swallowing, transient nausea, loose stools and indigestion (denies: fever, cough, congestion, CP, SOB, vomiting, diarrhea, HA). Alert, NAD, calm, interactive, resps e/u, speaking in clear complete sentences, no dyspnea noted, skin W&D, VSS.

## 2016-06-19 LAB — BASIC METABOLIC PANEL
Anion gap: 7 (ref 5–15)
BUN: 24 mg/dL — ABNORMAL HIGH (ref 6–20)
CO2: 30 mmol/L (ref 22–32)
Calcium: 9.1 mg/dL (ref 8.9–10.3)
Chloride: 99 mmol/L — ABNORMAL LOW (ref 101–111)
Creatinine, Ser: 0.65 mg/dL (ref 0.44–1.00)
GFR calc Af Amer: >60 mL/min (ref >60)
GFR calc non Af Amer: >60 mL/min (ref >60)
Glucose, Bld: 106 mg/dL — ABNORMAL HIGH (ref 65–99)
Potassium: 3.7 mmol/L (ref 3.5–5.1)
Sodium: 136 mmol/L (ref 135–145)

## 2016-06-19 LAB — CBC WITH DIFFERENTIAL/PLATELET
Basophils Absolute: 0 10*3/uL (ref 0.0–0.1)
Basophils Relative: 0 %
Eosinophils Absolute: 0.1 10*3/uL (ref 0.0–0.7)
Eosinophils Relative: 2 %
HCT: 39.6 % (ref 36.0–46.0)
Hemoglobin: 13.4 g/dL (ref 12.0–15.0)
Lymphocytes Relative: 31 %
Lymphs Abs: 1.6 10*3/uL (ref 0.7–4.0)
MCH: 30.6 pg (ref 26.0–34.0)
MCHC: 33.8 g/dL (ref 30.0–36.0)
MCV: 90.4 fL (ref 78.0–100.0)
Monocytes Absolute: 0.5 10*3/uL (ref 0.1–1.0)
Monocytes Relative: 9 %
Neutro Abs: 2.9 10*3/uL (ref 1.7–7.7)
Neutrophils Relative %: 58 %
Platelets: 263 10*3/uL (ref 150–400)
RBC: 4.38 MIL/uL (ref 3.87–5.11)
RDW: 12.4 % (ref 11.5–15.5)
WBC: 5.1 10*3/uL (ref 4.0–10.5)

## 2016-06-19 LAB — TROPONIN I
Troponin I: <0.03 ng/mL (ref <0.03)
Troponin I: <0.03 ng/mL (ref <0.03)

## 2016-06-19 NOTE — ED Notes (Signed)
Alert, NAD, calm, interactive, resps e/u, speaking in clear complete sentences, no dyspnea noted, skin W&D, VSS, mentions indigestion feeling (denies: pain, sob, nausea, dizziness or visual changes).

## 2016-06-19 NOTE — ED Provider Notes (Addendum)
[place] DEPT [facility] Provider Note   CSN: [id number] Arrival date & time: 06/18/16  2228   By signing my name below, I, Heather Merritt, attest that this documentation has been prepared under the direction and in the presence of [name], MD . Electronically Signed: Evelene Merritt, Scribe. 06/19/2016. 12:59 AM.   History   Chief Complaint Chief Complaint  Patient presents with  . Abdominal Pain   The history is provided by the patient. No language interpreter was used.     HPI Comments:  Heather Merritt is a 61 y.o. female with a history of HLD, GERD, HTN, who presents to the Emergency Department complaining of pressure in her jaw, neck, and head that lasted ~ 1 hour, onset ~ 5 hours ago. She went to check her BP when the episode began after dinner and her BP was 175/90. Pt  states she felt like she was having indigestion but cannot better describe this sensation and reports associated dizziness but states she feels better at this time. Pt denies  CP, SOB, vomiting and diaphoresis. No alleviating factors noted. Pt takes Diovan and Amlodipine for her HTN  and is compliant.   Past Medical History:  Diagnosis Date  . Allergy    SEASONAL  . ANXIETY 10/28/2007  . Arthritis   . Cataracts, bilateral    immature  . Chronic back pain   . DIVERTICULOSIS, COLON 10/28/2007  . Dry skin    red patch on right knee area  . GERD 10/28/2007   takes Prevacid daily  . H/O hiatal hernia   . Hx of colonic polyps   . HYPERLIPIDEMIA 10/28/2007   unable to take meds d/t allergies  . HYPERTENSION 10/28/2007   takes Diovan daily  . Internal hemorrhoids   . Irritable bowel syndrome (IBS)   . Joint pain   . Joint swelling   . LIVER FUNCTION TESTS, ABNORMAL, HX OF 10/29/2007  . Lumbar disc disease   . Migraine   . Obesity   . OSTEOPENIA 10/29/2007  . Osteoporosis   . Presbyacusis 01/04/2010  . Rosacea   . Seasonal allergies    takes Clarinex daily  . Vertigo    hx of  . Vitamin D deficiency     takes Vit D daily    Patient Active Problem List   Diagnosis Date Noted  . Microhematuria 06/05/2016  . Anterior neck pain 03/09/2016  . Vertigo 03/09/2016  . PAC (premature atrial contraction) 04/29/2015  . History of colonic polyps 04/29/2015  . Cervical radiculitis 12/21/2014  . Pain in the chest 04/15/2014  . Osteoporosis 02/27/2011  . Lumbar disc disease 02/27/2011  . Back pain 02/27/2011  . Preventative health care 02/24/2011  . Presbyacusis 01/04/2010  . LIVER FUNCTION TESTS, ABNORMAL, HX OF 10/29/2007  . Hyperlipidemia associated with type 2 diabetes mellitus ([place]) 10/28/2007  . ANXIETY 10/28/2007  . Essential hypertension 10/28/2007  . GERD 10/28/2007  . DIVERTICULOSIS, COLON 10/28/2007    Past Surgical History:  Procedure Laterality Date  . ANTERIOR CERVICAL DECOMP/DISCECTOMY FUSION  08/02/2011   Procedure: ANTERIOR CERVICAL DECOMPRESSION/DISCECTOMY FUSION 3 LEVELS;  Surgeon: Elaina Hoops, MD;  Location: [place] [facility];  Service: Neurosurgery;  Laterality: N/A;  Cervical Three-Four, Cervical Four-Five, Cervical Five-Six  Anterior Cervical Decompression Fusion   . COLONOSCOPY  2001/2013  . ESOPHAGOGASTRODUODENOSCOPY    . POLYPECTOMY    . TONSILLECTOMY    . TONSILLECTOMY  at age 39    OB History    No data available  Home Medications    Prior to Admission medications   Medication Sig Start Date End Date Taking? Authorizing Provider  amLODipine (NORVASC) 2.5 MG tablet Take 1 tablet (2.5 mg total) by mouth daily. 06/05/16 06/05/17  Biagio Borg, MD  aspirin 81 MG tablet Take 81 mg by mouth daily.      Historical Provider, MD  atorvastatin (LIPITOR) 10 MG tablet Take 1 tablet (10 mg total) by mouth every other day. Yearly physical w/labs due in January must see Md for refills 06/05/16   Biagio Borg, MD  Calcium Citrate (CITRACAL PO) Take 2 tablets by mouth daily.     Historical Provider, MD  Cholecalciferol (VITAMIN D) 2000 UNITS CAPS Take 2,000 Units by mouth  daily.    Historical Provider, MD  desloratadine (CLARINEX) 5 MG tablet Take 1 tablet (5 mg total) by mouth daily. Patient not taking: Reported on 06/05/2016 04/14/13   Biagio Borg, MD  fish oil-omega-3 fatty acids 1000 MG capsule Take 1 g by mouth daily.     Historical Provider, MD  ibuprofen (ADVIL,MOTRIN) 200 MG tablet Take 800 mg by mouth every 6 (six) hours as needed for moderate pain. Pain    Historical Provider, MD  lansoprazole (PREVACID) 30 MG capsule Take 1 capsule (30 mg total) by mouth daily. 06/05/16   Biagio Borg, MD  metroNIDAZOLE (METROGEL) 0.75 % gel Apply 1 application topically at bedtime.    Historical Provider, MD  Multiple Vitamin (MULITIVITAMIN WITH MINERALS) TABS Take 1 tablet by mouth daily.    Historical Provider, MD  pimecrolimus (ELIDEL) 1 % cream Apply 1 application topically every morning.    Historical Provider, MD  valsartan-hydrochlorothiazide (DIOVAN-HCT) 160-12.5 MG tablet Take 1 tablet by mouth daily. 06/05/16   Biagio Borg, MD  vitamin E 400 UNIT capsule Take 400 Units by mouth daily.    Historical Provider, MD    Family History Family History  Problem Relation Age of Onset  . Atrial fibrillation Mother   . Hyperlipidemia Mother   . Stroke Mother   . Heart disease Mother   . Atrial fibrillation Father   . Hypothyroidism Sister   . Hypothyroidism Maternal Grandmother   . Diabetes Maternal Grandmother   . Anesthesia problems Neg Hx   . Hypotension Neg Hx   . Malignant hyperthermia Neg Hx   . Pseudochol deficiency Neg Hx     Social History Social History  Substance Use Topics  . Smoking status: Former Research scientist (life sciences)  . Smokeless tobacco: Never Used     Comment: quit in 3yrs ago  . Alcohol use 0.0 oz/week     Comment: occ beer/drink     Allergies   Penicillins; Rosuvastatin; Simvastatin; Statins; Sulfonamide derivatives; and Sulfa antibiotics   Review of Systems Review of Systems 10 systems reviewed and all are negative for acute change except as  noted in the HPI.    Physical Exam Updated Vital Signs BP (!) 165/86 (BP Location: Right Arm)   Pulse 72   Temp 98.3 F (36.8 C) (Oral)   Resp 20   Ht 5' 3.75" (1.619 m)   Wt 185 lb (83.9 kg)   SpO2 98%   BMI 32.00 kg/m   Physical Exam  Constitutional: She is oriented to person, place, and time. She appears well-developed and well-nourished. No distress.  HENT:  Head: Normocephalic and atraumatic.  Nose: Nose normal.  Mouth/Throat: Oropharynx is clear and moist. No oropharyngeal exudate.  Eyes: Conjunctivae and EOM are normal. Pupils are  equal, round, and reactive to light. No scleral icterus.  Neck: Normal range of motion. Neck supple. No JVD present. No tracheal deviation present. No thyromegaly present.  Cardiovascular: Normal rate, regular rhythm and normal heart sounds.  Exam reveals no gallop and no friction rub.   No murmur heard. Pulmonary/Chest: Effort normal and breath sounds normal. No respiratory distress. She has no wheezes. She exhibits no tenderness.  Abdominal: Soft. Bowel sounds are normal. She exhibits no distension and no mass. There is no tenderness. There is no rebound and no guarding.  Musculoskeletal: Normal range of motion. She exhibits no edema or tenderness.  Lymphadenopathy:    She has no cervical adenopathy.  Neurological: She is alert and oriented to person, place, and time. No cranial nerve deficit. She exhibits normal muscle tone.  Skin: Skin is warm and dry. No rash noted. No erythema. No pallor.  Nursing note and vitals reviewed.    ED Treatments / Results  DIAGNOSTIC STUDIES:  Oxygen Saturation is 98% on RA, normal by my interpretation.    COORDINATION OF CARE:  12:49 AM Discussed treatment plan with pt at bedside and pt agreed to plan.  Labs (all labs ordered are listed, but only abnormal results are displayed) Labs Reviewed - No data to display  EKG  EKG Interpretation None       Radiology Dg Chest 2 View  Result Date:  06/18/2016 CLINICAL DATA:  61 year old female with indigestion and neck tightness. EXAM: CHEST  2 VIEW COMPARISON:  Chest radiograph dated 12/17/2014 FINDINGS: Minimal bibasilar atelectasis/ scarring. No focal consolidation, pleural effusion, or pneumothorax. The cardiac silhouette is within normal limits. No acute osseous pathology. Partially visualized lower cervical fixation hardware. IMPRESSION: No active cardiopulmonary disease. Electronically Signed   By: Anner Crete M.D.   On: 06/18/2016 23:29    Procedures Procedures (including critical care time)  Medications Ordered in ED Medications - No data to display   Initial Impression / Assessment and Plan / ED Course  I have reviewed the triage vital signs and the nursing notes.  Pertinent labs & imaging results that were available during my care of the patient were reviewed by me and considered in my medical decision making (see chart for details).     Patient presents to the ED for atypical ACS symptoms. I prefer to keep her in the ED for 3 hours for a rapid rule out, however she is requesting just one troponin be checked. Her EKG is unchanged from previous. She is still having the pressure although less so.  CXR is normal.  Will send one troponin for evaluation.  Currently she appears in NAD.  2:07 AM Troponin is negative.Patient now willing to stay for 3 hour troponin. We'll monitor the emergency department and obtain repeat troponin with EKG.   5:07 AM Repeat trop/EKG is neg.  No further symptoms from the patient.  She has been in NAD resting in the night in the ED.  CVS remain within her normal limits and she is safe for DC.    Final Clinical Impressions(s) / ED Diagnoses   Final diagnoses:  None    New Prescriptions New Prescriptions   No medications on file   I personally performed the services described in this documentation, which was scribed in my presence. The recorded information has been reviewed and is  accurate.       [name], MD 06/19/16 1017    [name], MD 06/19/16 [phone]

## 2016-06-19 NOTE — ED Notes (Signed)
Creta Levin, Rn given report

## 2016-06-19 NOTE — ED Notes (Signed)
Alert, NAD, calm, interactive, resps e/u, speaking in clear complete sentences, no dyspnea noted, skin W&D, VSS, resting comfortably, EDP into room to explain results and plan. Pt agreeable to stay. Family at [facility] leaving at this time.

## 2016-06-19 NOTE — ED Notes (Signed)
EDP into room to update pt on results and d/c plan.

## 2017-04-21 ENCOUNTER — Other Ambulatory Visit: Payer: Self-pay | Admitting: Internal Medicine

## 2017-05-14 LAB — HM MAMMOGRAPHY

## 2017-05-29 ENCOUNTER — Telehealth: Payer: Self-pay

## 2017-05-29 NOTE — Telephone Encounter (Signed)
LM informing patient.

## 2017-05-29 NOTE — Telephone Encounter (Signed)
It is active labs already in the chart. Patient can come whenever she is available to have drawn,.   Copied from [place]. Topic: Inquiry >> May 29, 2017  8:28 AM Pricilla Handler wrote: Reason for CRM: Patient wants her labs done at lease a week before per upcoming physical so that she and Dr. Jenny Reichmann can discuss the results at the physical. I advised the patient that labs are now being drawn on the same day as the Physical. Patient would like a call back this morning please.       Thank You!!!  >> May 29, 2017  8:45 AM [place], Isidoro Donning wrote: Can these orders be put in? Please advise.

## 2017-06-05 ENCOUNTER — Other Ambulatory Visit (INDEPENDENT_AMBULATORY_CARE_PROVIDER_SITE_OTHER): Payer: Managed Care, Other (non HMO)

## 2017-06-05 DIAGNOSIS — E559 Vitamin D deficiency, unspecified: Secondary | ICD-10-CM

## 2017-06-05 DIAGNOSIS — Z0000 Encounter for general adult medical examination without abnormal findings: Secondary | ICD-10-CM

## 2017-06-05 LAB — CBC WITH DIFFERENTIAL/PLATELET
Basophils Absolute: 0 10*3/uL (ref 0.0–0.1)
Basophils Relative: 0.7 % (ref 0.0–3.0)
Eosinophils Absolute: 0 10*3/uL (ref 0.0–0.7)
Eosinophils Relative: 1.2 % (ref 0.0–5.0)
HCT: 40.3 % (ref 36.0–46.0)
Hemoglobin: 13.9 g/dL (ref 12.0–15.0)
Lymphocytes Relative: 37.4 % (ref 12.0–46.0)
Lymphs Abs: 1.4 10*3/uL (ref 0.7–4.0)
MCHC: 34.5 g/dL (ref 30.0–36.0)
MCV: 90.4 fl (ref 78.0–100.0)
Monocytes Absolute: 0.3 10*3/uL (ref 0.1–1.0)
Monocytes Relative: 8.6 % (ref 3.0–12.0)
Neutro Abs: 2 10*3/uL (ref 1.4–7.7)
Neutrophils Relative %: 52.1 % (ref 43.0–77.0)
Platelets: 265 10*3/uL (ref 150.0–400.0)
RBC: 4.46 Mil/uL (ref 3.87–5.11)
RDW: 13 % (ref 11.5–15.5)
WBC: 3.8 10*3/uL — ABNORMAL LOW (ref 4.0–10.5)

## 2017-06-05 LAB — HEPATIC FUNCTION PANEL
ALT: 14 U/L (ref 0–35)
AST: 15 U/L (ref 0–37)
Albumin: 4.4 g/dL (ref 3.5–5.2)
Alkaline Phosphatase: 59 U/L (ref 39–117)
Bilirubin, Direct: 0.1 mg/dL (ref 0.0–0.3)
Total Bilirubin: 0.6 mg/dL (ref 0.2–1.2)
Total Protein: 7.2 g/dL (ref 6.0–8.3)

## 2017-06-05 LAB — BASIC METABOLIC PANEL
BUN: 20 mg/dL (ref 6–23)
CO2: 31 mEq/L (ref 19–32)
Calcium: 9.7 mg/dL (ref 8.4–10.5)
Chloride: 99 mEq/L (ref 96–112)
Creatinine, Ser: 0.66 mg/dL (ref 0.40–1.20)
GFR: 95.83 mL/min (ref >60.00)
Glucose, Bld: 87 mg/dL (ref 70–99)
Potassium: 4.3 mEq/L (ref 3.5–5.1)
Sodium: 137 mEq/L (ref 135–145)

## 2017-06-05 LAB — URINALYSIS, ROUTINE W REFLEX MICROSCOPIC
Bilirubin Urine: NEGATIVE
Hgb urine dipstick: NEGATIVE
Ketones, ur: NEGATIVE
Leukocytes, UA: NEGATIVE
Nitrite: NEGATIVE
RBC / HPF: NONE SEEN (ref 0–?)
Specific Gravity, Urine: 1.02 (ref 1.000–1.030)
Total Protein, Urine: NEGATIVE
Urine Glucose: NEGATIVE
Urobilinogen, UA: 0.2 (ref 0.0–1.0)
pH: 7 (ref 5.0–8.0)

## 2017-06-05 LAB — TSH: TSH: 1.86 u[IU]/mL (ref 0.35–4.50)

## 2017-06-05 LAB — LIPID PANEL
Cholesterol: 287 mg/dL — ABNORMAL HIGH (ref 0–200)
HDL: 45.4 mg/dL (ref >39.00)
NonHDL: 242
Total CHOL/HDL Ratio: 6
Triglycerides: 201 mg/dL — ABNORMAL HIGH (ref 0.0–149.0)
VLDL: 40.2 mg/dL — ABNORMAL HIGH (ref 0.0–40.0)

## 2017-06-05 LAB — LDL CHOLESTEROL, DIRECT: Direct LDL: 177 mg/dL

## 2017-06-05 LAB — VITAMIN D 25 HYDROXY (VIT D DEFICIENCY, FRACTURES): VITD: 40.06 ng/mL (ref 30.00–100.00)

## 2017-06-11 ENCOUNTER — Ambulatory Visit (INDEPENDENT_AMBULATORY_CARE_PROVIDER_SITE_OTHER): Payer: Managed Care, Other (non HMO) | Admitting: Internal Medicine

## 2017-06-11 ENCOUNTER — Encounter: Payer: Self-pay | Admitting: Internal Medicine

## 2017-06-11 VITALS — BP 128/84 | HR 77 | Temp 98.0°F | Ht 63.75 in | Wt 168.0 lb

## 2017-06-11 DIAGNOSIS — E785 Hyperlipidemia, unspecified: Secondary | ICD-10-CM | POA: Diagnosis not present

## 2017-06-11 DIAGNOSIS — M545 Low back pain: Secondary | ICD-10-CM

## 2017-06-11 DIAGNOSIS — I10 Essential (primary) hypertension: Secondary | ICD-10-CM | POA: Diagnosis not present

## 2017-06-11 DIAGNOSIS — M5412 Radiculopathy, cervical region: Secondary | ICD-10-CM

## 2017-06-11 DIAGNOSIS — Z0001 Encounter for general adult medical examination with abnormal findings: Secondary | ICD-10-CM

## 2017-06-11 DIAGNOSIS — G8929 Other chronic pain: Secondary | ICD-10-CM

## 2017-06-11 DIAGNOSIS — E1169 Type 2 diabetes mellitus with other specified complication: Secondary | ICD-10-CM | POA: Diagnosis not present

## 2017-06-11 MED ORDER — DESLORATADINE 5 MG PO TABS: 5.0000 mg | ORAL_TABLET | Freq: Every day | ORAL | 3 refills | Status: DC

## 2017-06-11 MED ORDER — LANSOPRAZOLE 30 MG PO CPDR: 30.0000 mg | DELAYED_RELEASE_CAPSULE | Freq: Every day | ORAL | 3 refills | Status: DC

## 2017-06-11 MED ORDER — ATORVASTATIN CALCIUM 10 MG PO TABS: ORAL_TABLET | ORAL | 3 refills | Status: DC

## 2017-06-11 MED ORDER — ZOSTER VAC RECOMB ADJUVANTED 50 MCG/0.5ML IM SUSR: 0.5000 mL | Freq: Once | INTRAMUSCULAR | 1 refills | Status: AC

## 2017-06-11 MED ORDER — AMLODIPINE BESYLATE 2.5 MG PO TABS: 2.5000 mg | ORAL_TABLET | Freq: Every day | ORAL | 3 refills | Status: DC

## 2017-06-11 MED ORDER — VALSARTAN-HYDROCHLOROTHIAZIDE 160-12.5 MG PO TABS: 1.0000 | ORAL_TABLET | Freq: Every day | ORAL | 3 refills | Status: DC

## 2017-06-11 MED ORDER — METRONIDAZOLE 0.75 % EX GEL: 1.0000 "application " | Freq: Every day | CUTANEOUS | 0 refills | Status: DC

## 2017-06-11 MED ORDER — PIMECROLIMUS 1 % EX CREA: 1.0000 "application " | TOPICAL_CREAM | Freq: Every morning | CUTANEOUS | 0 refills | Status: DC

## 2017-06-11 MED ORDER — GABAPENTIN 100 MG PO CAPS: 100.0000 mg | ORAL_CAPSULE | Freq: Three times a day (TID) | ORAL | 5 refills | Status: DC

## 2017-06-11 NOTE — Assessment & Plan Note (Addendum)
Persistent pain >6 wk with typical symptoms radicaulopathy without weakness on today exam, for gabapentin as above  In addition to the time spent performing CPE, I spent an additional 15 minutes face to face,in which greater than 50% of this time was spent in counseling and coordination of care for patient's illness as documented, including the differential dx, treatment, further evaluation and other management of neck pain and lower back pain, HTN and HLD

## 2017-06-11 NOTE — Assessment & Plan Note (Signed)
For f/u LDL with labs

## 2017-06-11 NOTE — Assessment & Plan Note (Signed)
Chronic recurrent, no LE neuro motor changes, decliens muscle relaxer or prednisone, ok for gabapentin 100 tid

## 2017-06-11 NOTE — Assessment & Plan Note (Signed)
stable overall by history and exam, recent data reviewed with pt, and pt to continue medical treatment as before,  to f/u any worsening symptoms or concerns  

## 2017-06-11 NOTE — Progress Notes (Signed)
Subjective:    Patient ID: Heather Merritt, female    DOB: December 03, 1955, 62 y.o.   MRN: [id number]  HPI  Here for wellness and f/u;  Overall doing ok;  Pt denies Chest pain, worsening SOB, DOE, wheezing, orthopnea, PND, worsening LE edema, palpitations, dizziness or syncope.  Pt denies neurological change such as new headache, facial or extremity weakness.  Pt denies polydipsia, polyuria, or low sugar symptoms. Pt states overall good compliance with treatment and medications, good tolerability, and has been trying to follow appropriate diet.  Pt denies worsening depressive symptoms, suicidal ideation or panic. No fever, night sweats, loss of appetite, or other constitutional symptoms.  Pt states good ability with ADL's, has low fall risk, home safety reviewed and adequate, no other significant changes in hearing or vision, and occasionally active with exercise. Lost 18 lbs with better diet and more activity Wt Readings from Last 3 Encounters:  06/11/17 168 lb (76.2 kg)  06/18/16 185 lb (83.9 kg)  06/05/16 186 lb (84.4 kg)  Due for cataracts out later this year. Due for Prevnar.  Did see Dr Vikki Ports Lowella Curb with neg cysto last yr for microhematuria.  Also with c/o burning oncstant bilat a nd worse midline post neck pain with reduced ROM to horizontal each direction, and when worse will radiate to each arm with numbness but no significant weakness or loss of grip strength.   Pt continues to have recurring LBP low lumbar midline without change in severity, bowel or bladder change, fever, wt loss,  worsening LE pain/numbness/weakness, gait change or falls. Has seen Dr Cram/NS with prior cervical surgury 3-4-5 in 2013. Did have ESI x 2 to lumbar about 3 yrs ago.   Past Medical History:  Diagnosis Date  . Allergy    SEASONAL  . ANXIETY 10/28/2007  . Arthritis   . Cataracts, bilateral    immature  . Chronic back pain   . DIVERTICULOSIS, COLON 10/28/2007  . Dry skin    red patch on right knee area  .  GERD 10/28/2007   takes Prevacid daily  . H/O hiatal hernia   . Hx of colonic polyps   . HYPERLIPIDEMIA 10/28/2007   unable to take meds d/t allergies  . HYPERTENSION 10/28/2007   takes Diovan daily  . Internal hemorrhoids   . Irritable bowel syndrome (IBS)   . Joint pain   . Joint swelling   . LIVER FUNCTION TESTS, ABNORMAL, HX OF 10/29/2007  . Lumbar disc disease   . Migraine   . Obesity   . OSTEOPENIA 10/29/2007  . Osteoporosis   . Presbyacusis 01/04/2010  . Rosacea   . Seasonal allergies    takes Clarinex daily  . Vertigo    hx of  . Vitamin D deficiency    takes Vit D daily   Past Surgical History:  Procedure Laterality Date  . ANTERIOR CERVICAL DECOMP/DISCECTOMY FUSION  08/02/2011   Procedure: ANTERIOR CERVICAL DECOMPRESSION/DISCECTOMY FUSION 3 LEVELS;  Surgeon: Elaina Hoops, MD;  Location: [place] [facility];  Service: Neurosurgery;  Laterality: N/A;  Cervical Three-Four, Cervical Four-Five, Cervical Five-Six  Anterior Cervical Decompression Fusion   . COLONOSCOPY  2001/2013  . ESOPHAGOGASTRODUODENOSCOPY    . POLYPECTOMY    . TONSILLECTOMY    . TONSILLECTOMY  at age 78    reports that she has quit smoking. she has never used smokeless tobacco. She reports that she drinks alcohol. She reports that she does not use drugs. family history includes Atrial fibrillation in  her father and mother; Diabetes in her maternal grandmother; Heart disease in her mother; Hyperlipidemia in her mother; Hypothyroidism in her maternal grandmother and sister; Stroke in her mother. Allergies  Allergen Reactions  . Penicillins Hives, Itching and Rash  . Rosuvastatin Other (See Comments)    REACTION: muscle weakness  . Simvastatin Other (See Comments)    REACTION: muscle weakness  . Statins Other (See Comments)    Myalgias and weakness  . Sulfonamide Derivatives Nausea Only and Other (See Comments)    also abd pain  . Sulfa Antibiotics Hives   Current Outpatient Medications on File Prior to Visit    Medication Sig Dispense Refill  . aspirin 81 MG tablet Take 81 mg by mouth daily.      . Calcium Citrate (CITRACAL PO) Take 2 tablets by mouth daily.     . Cholecalciferol (VITAMIN D) 2000 UNITS CAPS Take 2,000 Units by mouth daily.    . fish oil-omega-3 fatty acids 1000 MG capsule Take 1 g by mouth daily.     Marland Kitchen ibuprofen (ADVIL,MOTRIN) 200 MG tablet Take 800 mg by mouth every 6 (six) hours as needed for moderate pain. Pain    . Multiple Vitamin (MULITIVITAMIN WITH MINERALS) TABS Take 1 tablet by mouth daily.    . vitamin E 400 UNIT capsule Take 400 Units by mouth daily.     No current facility-administered medications on file prior to visit.    Review of Systems Constitutional: Negative for other unusual diaphoresis, sweats, appetite or weight changes HENT: Negative for other worsening hearing loss, ear pain, facial swelling, mouth sores or neck stiffness.   Eyes: Negative for other worsening pain, redness or other visual disturbance.  Respiratory: Negative for other stridor or swelling Cardiovascular: Negative for other palpitations or other chest pain  Gastrointestinal: Negative for worsening diarrhea or loose stools, blood in stool, distention or other pain Genitourinary: Negative for hematuria, flank pain or other change in urine volume.  Musculoskeletal: Negative for myalgias or other joint swelling.  Skin: Negative for other color change, or other wound or worsening drainage.  Neurological: Negative for other syncope or numbness. Hematological: Negative for other adenopathy or swelling Psychiatric/Behavioral: Negative for hallucinations, other worsening agitation, SI, self-injury, or new decreased concentration All other system neg per pt    Objective:   Physical Exam BP 128/84   Pulse 77   Temp 98 F (36.7 C)   Ht 5' 3.75" (1.619 m)   Wt 168 lb (76.2 kg)   SpO2 97%   BMI 29.06 kg/m  VS noted,  Constitutional: Pt is oriented to person, place, and time. Appears  well-developed and well-nourished, in no significant distress and comfortable Head: Normocephalic and atraumatic  Eyes: Conjunctivae and EOM are normal. Pupils are equal, round, and reactive to light Right Ear: External ear normal without discharge Left Ear: External ear normal without discharge Nose: Nose without discharge or deformity Mouth/Throat: Oropharynx is without other ulcerations and moist  Neck: Normal range of motion. Neck supple. No JVD present. No tracheal deviation present or significant neck LA or mass Cardiovascular: Normal rate, regular rhythm, normal heart sounds and intact distal pulses  Pulmonary/Chest: WOB normal and breath sounds without rales or wheezing  Abdominal: Soft. Bowel sounds are normal. NT. No HSM  Musculoskeletal: Normal range of motion. Exhibits no edema Lymphadenopathy: Has no other cervical adenopathy.  Neurological: Pt is alert and oriented to person, place, and time. Pt has normal reflexes. No cranial nerve deficit. Motor grossly intact, Gait  intact Skin: Skin is warm and dry. No rash noted or new ulcerations Psychiatric:  Has normal mood and affect. Behavior is normal without agitation No other exam findings  Lab Results  Component Value Date   WBC 3.8 (L) 06/05/2017   HGB 13.9 06/05/2017   HCT 40.3 06/05/2017   PLT 265.0 06/05/2017   GLUCOSE 87 06/05/2017   CHOL 287 (H) 06/05/2017   TRIG 201.0 (H) 06/05/2017   HDL 45.40 06/05/2017   LDLDIRECT 177.0 06/05/2017   ALT 14 06/05/2017   AST 15 06/05/2017   NA 137 06/05/2017   K 4.3 06/05/2017   CL 99 06/05/2017   CREATININE 0.66 06/05/2017   BUN 20 06/05/2017   CO2 31 06/05/2017   TSH 1.86 06/05/2017      Assessment & Plan:

## 2017-06-11 NOTE — Patient Instructions (Addendum)
Your shingles shot was sent to the pharmacy  Please take all new medication as prescribed - the gabapentin for pain  You will be contacted regarding the referral for: MRI neck, and Dr Saintclair Halsted  Please continue all other medications as before, and refills have been done if requested.  Please have the pharmacy call with any other refills you may need.  Please continue your efforts at being more active, low cholesterol diet, and weight control.  You are otherwise up to date with prevention measures today.  Please keep your appointments with your specialists as you may have planned  Please return in 6 mo, or sooner if needed, with Lab testing done 3-5 days before (but not if you have traditional medicare as  Your primary insurance)

## 2017-06-11 NOTE — Assessment & Plan Note (Signed)

## 2017-07-08 ENCOUNTER — Ambulatory Visit
Admission: RE | Admit: 2017-07-08 | Discharge: 2017-07-08 | Disposition: A | Discharge status: Home / Self Care | Payer: Managed Care, Other (non HMO) | Source: Ambulatory Visit | Attending: Internal Medicine | Admitting: Internal Medicine

## 2017-07-08 DIAGNOSIS — M5412 Radiculopathy, cervical region: Secondary | ICD-10-CM

## 2017-12-11 ENCOUNTER — Ambulatory Visit: Payer: Managed Care, Other (non HMO) | Admitting: Internal Medicine

## 2017-12-12 ENCOUNTER — Encounter: Payer: Self-pay | Admitting: Internal Medicine

## 2017-12-12 ENCOUNTER — Ambulatory Visit (INDEPENDENT_AMBULATORY_CARE_PROVIDER_SITE_OTHER): Payer: Managed Care, Other (non HMO) | Admitting: Internal Medicine

## 2017-12-12 VITALS — BP 118/66 | HR 83 | Temp 98.5°F | Ht 63.75 in | Wt 167.0 lb

## 2017-12-12 DIAGNOSIS — E785 Hyperlipidemia, unspecified: Secondary | ICD-10-CM | POA: Insufficient documentation

## 2017-12-12 DIAGNOSIS — E1169 Type 2 diabetes mellitus with other specified complication: Secondary | ICD-10-CM

## 2017-12-12 DIAGNOSIS — I10 Essential (primary) hypertension: Secondary | ICD-10-CM | POA: Diagnosis not present

## 2017-12-12 DIAGNOSIS — E119 Type 2 diabetes mellitus without complications: Secondary | ICD-10-CM | POA: Insufficient documentation

## 2017-12-12 DIAGNOSIS — F411 Generalized anxiety disorder: Secondary | ICD-10-CM

## 2017-12-12 DIAGNOSIS — Z0000 Encounter for general adult medical examination without abnormal findings: Secondary | ICD-10-CM

## 2017-12-12 HISTORY — DX: Hyperlipidemia, unspecified: E78.5

## 2017-12-12 NOTE — Assessment & Plan Note (Signed)
stable overall by history and exam, recent data reviewed with pt, and pt to continue medical treatment as before,  to f/u any worsening symptoms or concerns  

## 2017-12-12 NOTE — Assessment & Plan Note (Signed)
stable overall by history and exam, recent data reviewed with pt, and pt to continue medical treatment as before,  to f/u any worsening symptoms or concerns, for lower chol diet 

## 2017-12-12 NOTE — Progress Notes (Signed)
Subjective:    Patient ID: Heather Merritt, female    DOB: 10/14/1955, 62 y.o.   MRN: [id number]  HPI  Here to f/u; overall doing ok,  Pt denies chest pain, increasing sob or doe, wheezing, orthopnea, PND, increased LE swelling, palpitations, dizziness or syncope.  Pt denies new neurological symptoms such as new headache, or facial or extremity weakness or numbness.  Pt denies polydipsia, polyuria, or low sugar episode.  Pt states overall good compliance with meds, mostly trying to follow appropriate diet, with wt overall stable,  but little exercise however.  Left neck pain improved with Dr Saintclair Halsted and dry needling and PT and trelaxation techniques.  Has had some recent Low abd cramping, Saw GYN with ultrasound with uterine mass determined to be a polyp, now removed x 2 wks, path benign (non malignant)  Gets flu shot at the office, still waiting for shingrix.  No new complaints  Denies worsening depressive symptoms, suicidal ideation, or panic Past Medical History:  Diagnosis Date  . Allergy    SEASONAL  . ANXIETY 10/28/2007  . Arthritis   . Cataracts, bilateral    immature  . Chronic back pain   . DIVERTICULOSIS, COLON 10/28/2007  . Dry skin    red patch on right knee area  . GERD 10/28/2007   takes Prevacid daily  . H/O hiatal hernia   . HLD (hyperlipidemia) 12/12/2017  . Hx of colonic polyps   . HYPERLIPIDEMIA 10/28/2007   unable to take meds d/t allergies  . HYPERTENSION 10/28/2007   takes Diovan daily  . Internal hemorrhoids   . Irritable bowel syndrome (IBS)   . Joint pain   . Joint swelling   . LIVER FUNCTION TESTS, ABNORMAL, HX OF 10/29/2007  . Lumbar disc disease   . Migraine   . Obesity   . OSTEOPENIA 10/29/2007  . Osteoporosis   . Presbyacusis 01/04/2010  . Rosacea   . Seasonal allergies    takes Clarinex daily  . Vertigo    hx of  . Vitamin D deficiency    takes Vit D daily   Past Surgical History:  Procedure Laterality Date  . ANTERIOR CERVICAL DECOMP/DISCECTOMY  FUSION  08/02/2011   Procedure: ANTERIOR CERVICAL DECOMPRESSION/DISCECTOMY FUSION 3 LEVELS;  Surgeon: Elaina Hoops, MD;  Location: [facility];  Service: Neurosurgery;  Laterality: N/A;  Cervical Three-Four, Cervical Four-Five, Cervical Five-Six  Anterior Cervical Decompression Fusion   . COLONOSCOPY  2001/2013  . ESOPHAGOGASTRODUODENOSCOPY    . POLYPECTOMY    . TONSILLECTOMY    . TONSILLECTOMY  at age 36    reports that she has quit smoking. She has never used smokeless tobacco. She reports that she drinks alcohol. She reports that she does not use drugs. family history includes Atrial fibrillation in her father and mother; Diabetes in her maternal grandmother; Heart disease in her mother; Hyperlipidemia in her mother; Hypothyroidism in her maternal grandmother and sister; Stroke in her mother. Allergies  Allergen Reactions  . Penicillins Hives, Itching and Rash  . Rosuvastatin Other (See Comments)    REACTION: muscle weakness  . Simvastatin Other (See Comments)    REACTION: muscle weakness  . Statins Other (See Comments)    Myalgias and weakness  . Sulfonamide Derivatives Other (See Comments)    Nausea, vomiting, abd pain  . Sulfa Antibiotics Other (See Comments)    Nausea, vomiting, abd pain   Current Outpatient Medications on File Prior to Visit  Medication Sig Dispense Refill  .  amLODipine (NORVASC) 2.5 MG tablet Take 1 tablet (2.5 mg total) by mouth daily. 90 tablet 3  . aspirin 81 MG tablet Take 81 mg by mouth daily.      Marland Kitchen atorvastatin (LIPITOR) 10 MG tablet TAKE 1 TABLET BY MOUTH  EVERY OTHER DAY. YEARLY  PHYSICAL W/LABS DUE IN  JANUARY MUST SEE MD FOR  REFILLS 45 tablet 3  . Calcium Citrate (CITRACAL PO) Take 2 tablets by mouth daily.     . Cholecalciferol (VITAMIN D) 2000 UNITS CAPS Take 2,000 Units by mouth daily.    Marland Kitchen desloratadine (CLARINEX) 5 MG tablet Take 1 tablet (5 mg total) by mouth daily. (Patient taking differently: Take 5 mg by mouth as needed. ) 90 tablet 3  .  fish oil-omega-3 fatty acids 1000 MG capsule Take 1 g by mouth daily.     Marland Kitchen ibuprofen (ADVIL,MOTRIN) 200 MG tablet Take 800 mg by mouth every 6 (six) hours as needed for moderate pain. Pain    . lansoprazole (PREVACID) 30 MG capsule Take 1 capsule (30 mg total) by mouth daily. 90 capsule 3  . metroNIDAZOLE (METROGEL) 0.75 % gel Apply 1 application topically at bedtime. 45 g 0  . Multiple Vitamin (MULITIVITAMIN WITH MINERALS) TABS Take 1 tablet by mouth daily.    . pimecrolimus (ELIDEL) 1 % cream Apply 1 application topically every morning. 30 g 0  . valsartan-hydrochlorothiazide (DIOVAN-HCT) 160-12.5 MG tablet Take 1 tablet by mouth daily. 90 tablet 3  . vitamin E 400 UNIT capsule Take 400 Units by mouth daily.     No current facility-administered medications on file prior to visit.    Review of Systems  Constitutional: Negative for other unusual diaphoresis or sweats HENT: Negative for ear discharge or swelling Eyes: Negative for other worsening visual disturbances Respiratory: Negative for stridor or other swelling  Gastrointestinal: Negative for worsening distension or other blood Genitourinary: Negative for retention or other urinary change Musculoskeletal: Negative for other MSK pain or swelling Skin: Negative for color change or other new lesions Neurological: Negative for worsening tremors and other numbness  Psychiatric/Behavioral: Negative for worsening agitation or other fatigue All other system neg per pt    Objective:   Physical Exam BP 118/66   Pulse 83   Temp 98.5 F (36.9 C) (Oral)   Ht 5' 3.75" (1.619 m)   Wt 167 lb (75.8 kg)   SpO2 94%   BMI 28.89 kg/m  VS noted,  Constitutional: Pt appears in NAD HENT: Head: NCAT.  Right Ear: External ear normal.  Left Ear: External ear normal.  Eyes: . Pupils are equal, round, and reactive to light. Conjunctivae and EOM are normal Nose: without d/c or deformity Neck: Neck supple. Gross normal ROM Cardiovascular: Normal  rate and regular rhythm.   Pulmonary/Chest: Effort normal and breath sounds without rales or wheezing.  Abd:  Soft, NT, ND, + BS, no organomegaly Neurological: Pt is alert. At baseline orientation, motor grossly intact Skin: Skin is warm. No rashes, other new lesions, no LE edema Psychiatric: Pt behavior is normal without agitation  No other exam findings Lab Results  Component Value Date   WBC 3.8 (L) 06/05/2017   HGB 13.9 06/05/2017   HCT 40.3 06/05/2017   PLT 265.0 06/05/2017   GLUCOSE 87 06/05/2017   CHOL 287 (H) 06/05/2017   TRIG 201.0 (H) 06/05/2017   HDL 45.40 06/05/2017   LDLDIRECT 177.0 06/05/2017   ALT 14 06/05/2017   AST 15 06/05/2017   NA 137 06/05/2017  K 4.3 06/05/2017   CL 99 06/05/2017   CREATININE 0.66 06/05/2017   BUN 20 06/05/2017   CO2 31 06/05/2017   TSH 1.86 06/05/2017       Assessment & Plan:

## 2017-12-12 NOTE — Patient Instructions (Signed)
Please continue all other medications as before, and refills have been done if requested.  Please have the pharmacy call with any other refills you may need.  Please continue your efforts at being more active, low cholesterol diet, and weight control..  Please keep your appointments with your specialists as you may have planned  Please return in 6 months, or sooner if needed, with Lab testing done 3-5 days before  

## 2017-12-19 DIAGNOSIS — K120 Recurrent oral aphthae: Secondary | ICD-10-CM | POA: Insufficient documentation

## 2018-03-22 IMAGING — DX DG CERVICAL SPINE COMPLETE 4+V
5 series · 5 of 5 positions shown · non-contrast
Comparison: Cervical spine radiographs dating back through
09/06/2011

CLINICAL DATA: Intermittent anterior cervical spinal pain x6
months. This fascia. History cervical fusion.

EXAM:
CERVICAL SPINE - COMPLETE 4+ VIEW

[c-spine lat]
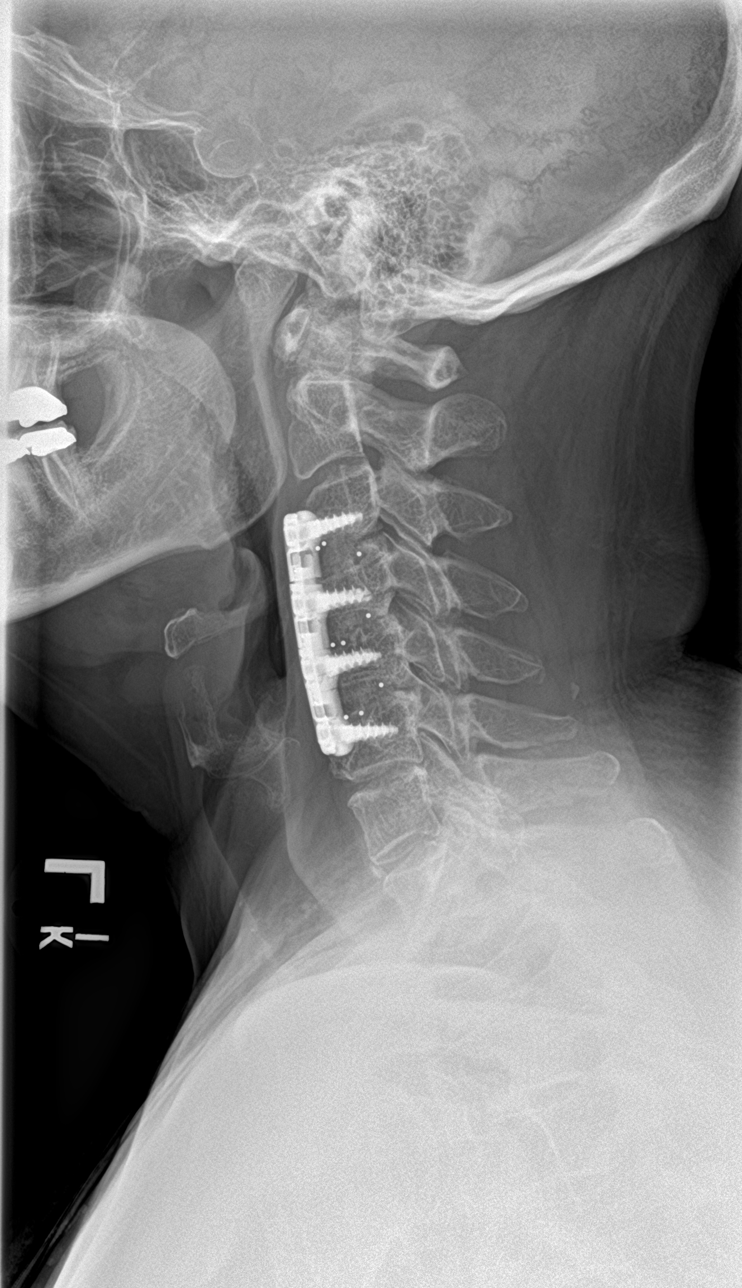

[c-spine obl (1 of 2)]
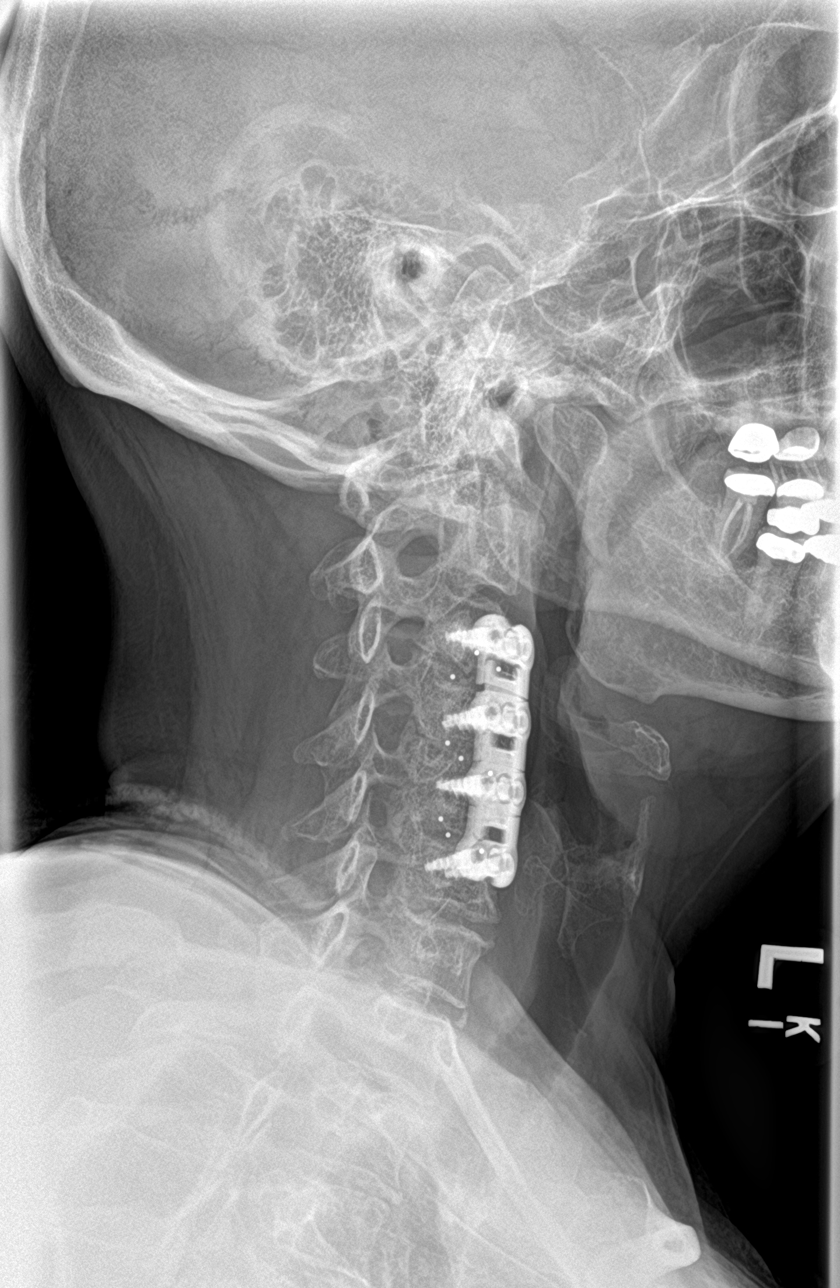

[c-spine obl (2 of 2)]
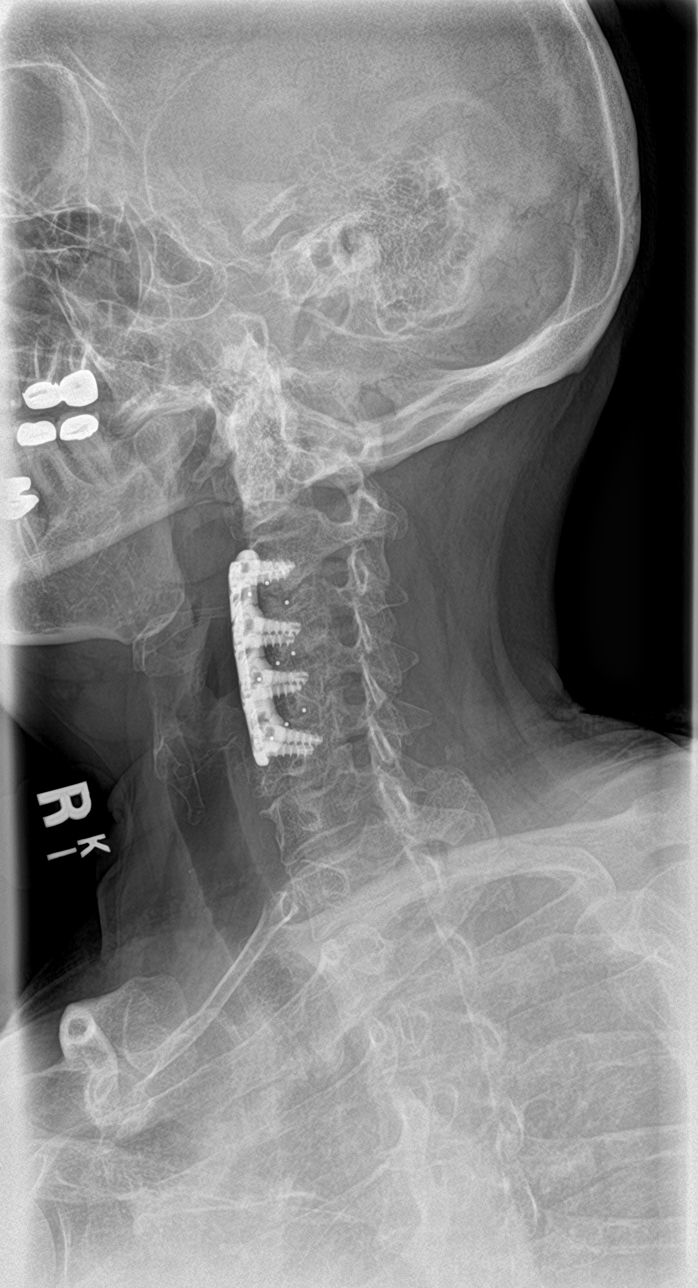

[c-spine ap]
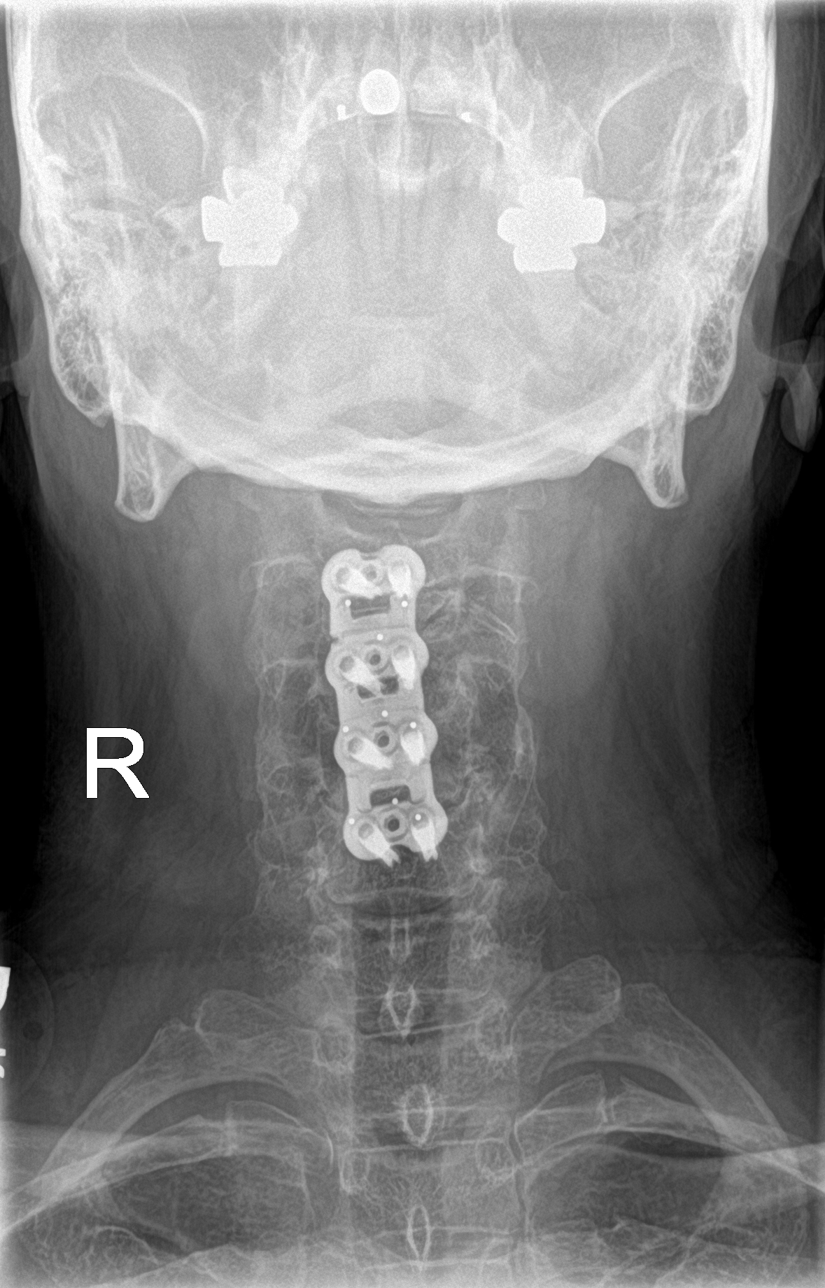

[c-spine open mouth]
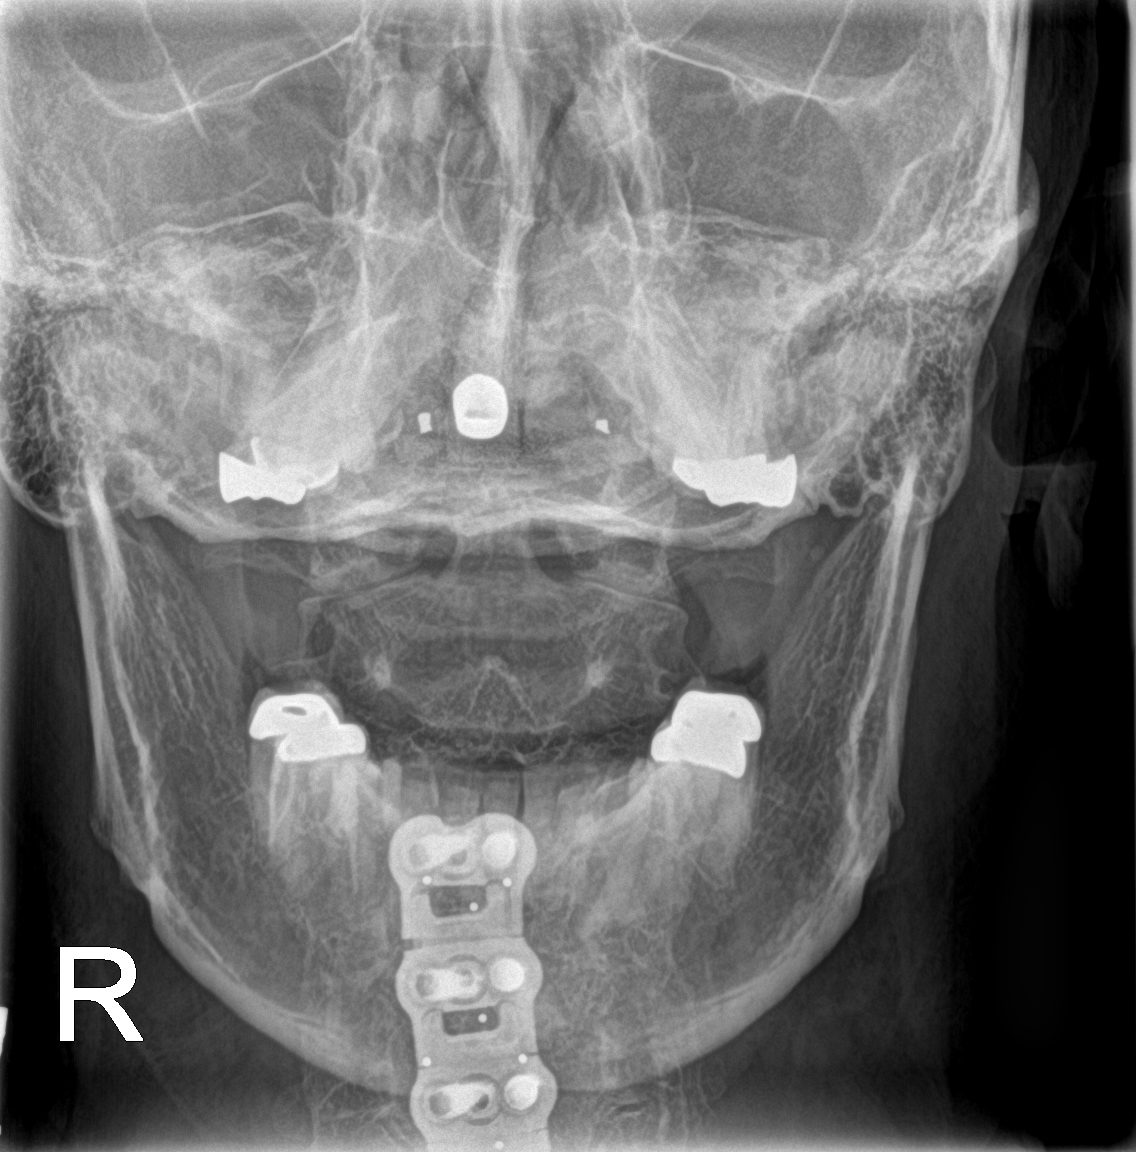

[5 of 5 positions shown; findings below may reference images not displayed]

FINDINGS: Anterior cervical disc fusion hardware noted from C3 through C6 as
before without prevertebral soft tissue thickening or swelling. Some
interval orbit incorporation of interbody blocks noted since prior
exam. No acute fracture or malalignment. The nasopharynx,
oropharynx, pharynx and hypopharynx are unremarkable. The trachea is
patent to the extent visible to the thoracic inlet.
IMPRESSION: ACDF from C3 through C6 without acute osseous abnormality nor
hardware failure. No soft tissue abnormalities are apparent.

## 2018-05-22 ENCOUNTER — Other Ambulatory Visit: Payer: Self-pay | Admitting: Internal Medicine

## 2018-06-11 LAB — HM MAMMOGRAPHY

## 2018-06-12 ENCOUNTER — Ambulatory Visit: Payer: Managed Care, Other (non HMO) | Admitting: Internal Medicine

## 2018-06-30 ENCOUNTER — Other Ambulatory Visit: Payer: Self-pay | Admitting: Internal Medicine

## 2018-06-30 MED ORDER — LANSOPRAZOLE 30 MG PO CPDR: 30.0000 mg | DELAYED_RELEASE_CAPSULE | Freq: Every day | ORAL | 0 refills | Status: DC

## 2018-06-30 MED ORDER — AMLODIPINE BESYLATE 2.5 MG PO TABS: 2.5000 mg | ORAL_TABLET | Freq: Every day | ORAL | 0 refills | Status: DC

## 2018-06-30 MED ORDER — VALSARTAN-HYDROCHLOROTHIAZIDE 160-12.5 MG PO TABS: 1.0000 | ORAL_TABLET | Freq: Every day | ORAL | 0 refills | Status: DC

## 2018-06-30 MED ORDER — ATORVASTATIN CALCIUM 10 MG PO TABS: ORAL_TABLET | ORAL | 0 refills | Status: DC

## 2018-06-30 NOTE — Telephone Encounter (Signed)
Copied from [place] [id number]. Topic: General - Other >> Jun 30, 2018 11:36 AM Lennox Solders wrote: Reason for CRM: Pt has a new pharm [facility] mail order and would like new rxs sent to [facility]. Pt needs atorvastatin , lansoprazole, amlodipine, valasartan-hctz #90 with refills

## 2018-06-30 NOTE — Telephone Encounter (Signed)
Reviewed chart pt is due for follow-up appt in may. Sent 90 day script to [facility] until appt.Marland KitchenJohny Chess

## 2018-07-03 MED ORDER — ESOMEPRAZOLE MAGNESIUM 40 MG PO CPDR: 40.0000 mg | DELAYED_RELEASE_CAPSULE | Freq: Every day | ORAL | 3 refills | Status: DC

## 2018-07-03 NOTE — Telephone Encounter (Signed)
Pt states his insurance does cover the lansoprazole (PREVACID) 30 MG capsule.  Pt states they sent a PA for this med. Pt states she does not know what they cover, pt states she is going to call [facility] and see what they do cover. She had given them the incorrect fax number to send PA, but will update with them and call us back.

## 2018-07-03 NOTE — Telephone Encounter (Signed)
Noted  

## 2018-07-03 NOTE — Addendum Note (Signed)
Addended by: Biagio Borg on: 07/03/2018 10:46 AM   Modules accepted: Orders

## 2018-07-03 NOTE — Telephone Encounter (Signed)
Pt stated she spoke her insurance company and they will cover either omeprazole or esomeprazole.

## 2018-07-03 NOTE — Telephone Encounter (Signed)
Esomeprazole - done erx to mail order

## 2018-07-08 ENCOUNTER — Encounter: Payer: Managed Care, Other (non HMO) | Admitting: Internal Medicine

## 2018-09-30 ENCOUNTER — Other Ambulatory Visit: Payer: Medicare Other

## 2018-10-03 ENCOUNTER — Encounter (HOSPITAL_BASED_OUTPATIENT_CLINIC_OR_DEPARTMENT_OTHER): Payer: Self-pay

## 2018-10-03 ENCOUNTER — Emergency Department (HOSPITAL_BASED_OUTPATIENT_CLINIC_OR_DEPARTMENT_OTHER): Payer: Medicare Other

## 2018-10-03 ENCOUNTER — Other Ambulatory Visit: Payer: Self-pay

## 2018-10-03 ENCOUNTER — Inpatient Hospital Stay (HOSPITAL_BASED_OUTPATIENT_CLINIC_OR_DEPARTMENT_OTHER)
Admission: EM | Admit: 2018-10-03 | Discharge: 2018-10-07 | DRG: 247 | Disposition: A | Discharge status: Home / Self Care | Payer: Medicare Other | Attending: Cardiology | Admitting: Cardiology

## 2018-10-03 DIAGNOSIS — I214 Non-ST elevation (NSTEMI) myocardial infarction: Secondary | ICD-10-CM | POA: Insufficient documentation

## 2018-10-03 DIAGNOSIS — E785 Hyperlipidemia, unspecified: Secondary | ICD-10-CM | POA: Diagnosis not present

## 2018-10-03 DIAGNOSIS — K219 Gastro-esophageal reflux disease without esophagitis: Secondary | ICD-10-CM | POA: Diagnosis not present

## 2018-10-03 DIAGNOSIS — Z7982 Long term (current) use of aspirin: Secondary | ICD-10-CM | POA: Diagnosis not present

## 2018-10-03 DIAGNOSIS — E559 Vitamin D deficiency, unspecified: Secondary | ICD-10-CM | POA: Diagnosis not present

## 2018-10-03 DIAGNOSIS — Z8249 Family history of ischemic heart disease and other diseases of the circulatory system: Secondary | ICD-10-CM

## 2018-10-03 DIAGNOSIS — R079 Chest pain, unspecified: Secondary | ICD-10-CM

## 2018-10-03 DIAGNOSIS — T463X5A Adverse effect of coronary vasodilators, initial encounter: Secondary | ICD-10-CM | POA: Diagnosis not present

## 2018-10-03 DIAGNOSIS — I952 Hypotension due to drugs: Secondary | ICD-10-CM | POA: Diagnosis not present

## 2018-10-03 DIAGNOSIS — K449 Diaphragmatic hernia without obstruction or gangrene: Secondary | ICD-10-CM | POA: Diagnosis present

## 2018-10-03 DIAGNOSIS — I25110 Atherosclerotic heart disease of native coronary artery with unstable angina pectoris: Secondary | ICD-10-CM | POA: Diagnosis present

## 2018-10-03 DIAGNOSIS — J302 Other seasonal allergic rhinitis: Secondary | ICD-10-CM | POA: Diagnosis not present

## 2018-10-03 DIAGNOSIS — Z823 Family history of stroke: Secondary | ICD-10-CM

## 2018-10-03 DIAGNOSIS — Z87891 Personal history of nicotine dependence: Secondary | ICD-10-CM | POA: Diagnosis not present

## 2018-10-03 DIAGNOSIS — M810 Age-related osteoporosis without current pathological fracture: Secondary | ICD-10-CM | POA: Diagnosis present

## 2018-10-03 DIAGNOSIS — Y92230 Patient room in hospital as the place of occurrence of the external cause: Secondary | ICD-10-CM | POA: Diagnosis not present

## 2018-10-03 DIAGNOSIS — Z882 Allergy status to sulfonamides status: Secondary | ICD-10-CM

## 2018-10-03 DIAGNOSIS — Z888 Allergy status to other drugs, medicaments and biological substances status: Secondary | ICD-10-CM | POA: Diagnosis not present

## 2018-10-03 DIAGNOSIS — Z880 Allergy status to penicillin: Secondary | ICD-10-CM | POA: Diagnosis not present

## 2018-10-03 DIAGNOSIS — Z955 Presence of coronary angioplasty implant and graft: Secondary | ICD-10-CM

## 2018-10-03 DIAGNOSIS — Z79899 Other long term (current) drug therapy: Secondary | ICD-10-CM

## 2018-10-03 DIAGNOSIS — Z8349 Family history of other endocrine, nutritional and metabolic diseases: Secondary | ICD-10-CM | POA: Diagnosis not present

## 2018-10-03 DIAGNOSIS — Z20828 Contact with and (suspected) exposure to other viral communicable diseases: Secondary | ICD-10-CM | POA: Diagnosis not present

## 2018-10-03 DIAGNOSIS — R070 Pain in throat: Secondary | ICD-10-CM | POA: Diagnosis not present

## 2018-10-03 DIAGNOSIS — I10 Essential (primary) hypertension: Secondary | ICD-10-CM | POA: Diagnosis not present

## 2018-10-03 HISTORY — DX: Atherosclerotic heart disease of native coronary artery without angina pectoris: I25.10

## 2018-10-03 LAB — COMPREHENSIVE METABOLIC PANEL
ALT: 26 U/L (ref 0–44)
AST: 24 U/L (ref 15–41)
Albumin: 4.3 g/dL (ref 3.5–5.0)
Alkaline Phosphatase: 55 U/L (ref 38–126)
Anion gap: 10 (ref 5–15)
BUN: 12 mg/dL (ref 8–23)
CO2: 26 mmol/L (ref 22–32)
Calcium: 9.3 mg/dL (ref 8.9–10.3)
Chloride: 102 mmol/L (ref 98–111)
Creatinine, Ser: 0.5 mg/dL (ref 0.44–1.00)
GFR calc Af Amer: >60 mL/min (ref >60)
GFR calc non Af Amer: >60 mL/min (ref >60)
Glucose, Bld: 108 mg/dL — ABNORMAL HIGH (ref 70–99)
Potassium: 3.6 mmol/L (ref 3.5–5.1)
Sodium: 138 mmol/L (ref 135–145)
Total Bilirubin: 0.8 mg/dL (ref 0.3–1.2)
Total Protein: 7.2 g/dL (ref 6.5–8.1)

## 2018-10-03 LAB — HEPARIN LEVEL (UNFRACTIONATED): Heparin Unfractionated: 0.42 IU/mL (ref 0.30–0.70)

## 2018-10-03 LAB — CBC
HCT: 41.7 % (ref 36.0–46.0)
Hemoglobin: 13.9 g/dL (ref 12.0–15.0)
MCH: 30.3 pg (ref 26.0–34.0)
MCHC: 33.3 g/dL (ref 30.0–36.0)
MCV: 90.8 fL (ref 80.0–100.0)
Platelets: 264 10*3/uL (ref 150–400)
RBC: 4.59 MIL/uL (ref 3.87–5.11)
RDW: 12.1 % (ref 11.5–15.5)
WBC: 4.2 10*3/uL (ref 4.0–10.5)
nRBC: 0 % (ref 0.0–0.2)

## 2018-10-03 LAB — PROTIME-INR
INR: 0.9 (ref 0.8–1.2)
Prothrombin Time: 12.4 seconds (ref 11.4–15.2)

## 2018-10-03 LAB — SARS CORONAVIRUS 2 AG (30 MIN TAT): SARS Coronavirus 2 Ag: NEGATIVE

## 2018-10-03 LAB — APTT: aPTT: 33 seconds (ref 24–36)

## 2018-10-03 LAB — TROPONIN I (HIGH SENSITIVITY)
Troponin I (High Sensitivity): 86 ng/L — ABNORMAL HIGH (ref <18)
Troponin I (High Sensitivity): 104 ng/L (ref <18)

## 2018-10-03 MED ORDER — ASPIRIN EC 81 MG PO TBEC: 81.0000 mg | DELAYED_RELEASE_TABLET | Freq: Every day | ORAL | Status: DC

## 2018-10-03 MED ORDER — HEPARIN (PORCINE) 25000 UT/250ML-% IV SOLN
850.0000 [IU]/h | INTRAVENOUS | Status: DC
  Administered 2018-10-03: 850 [IU]/h via INTRAVENOUS
  Filled 2018-10-03: qty 250

## 2018-10-03 MED ORDER — NITROGLYCERIN 0.4 MG SL SUBL
0.4000 mg | SUBLINGUAL_TABLET | SUBLINGUAL | Status: DC | PRN
  Administered 2018-10-05: 0.4 mg via SUBLINGUAL
  Filled 2018-10-03: qty 1

## 2018-10-03 MED ORDER — ACETAMINOPHEN 325 MG PO TABS
650.0000 mg | ORAL_TABLET | ORAL | Status: DC | PRN
  Administered 2018-10-04: 650 mg via ORAL
  Filled 2018-10-03: qty 2

## 2018-10-03 MED ORDER — HYDROCHLOROTHIAZIDE 12.5 MG PO CAPS
12.5000 mg | ORAL_CAPSULE | Freq: Every day | ORAL | Status: DC
  Administered 2018-10-04 – 2018-10-07 (×3): 12.5 mg via ORAL
  Filled 2018-10-03 (×4): qty 1

## 2018-10-03 MED ORDER — VALSARTAN-HYDROCHLOROTHIAZIDE 160-12.5 MG PO TABS: 1.0000 | ORAL_TABLET | Freq: Every day | ORAL | Status: DC

## 2018-10-03 MED ORDER — HEPARIN BOLUS VIA INFUSION
4000.0000 [IU] | Freq: Once | INTRAVENOUS | Status: AC
  Administered 2018-10-03: 4000 [IU] via INTRAVENOUS

## 2018-10-03 MED ORDER — ASPIRIN 300 MG RE SUPP: 300.0000 mg | RECTAL | Status: AC

## 2018-10-03 MED ORDER — ASPIRIN 81 MG PO CHEW: 324.0000 mg | CHEWABLE_TABLET | ORAL | Status: DC

## 2018-10-03 MED ORDER — ASPIRIN 81 MG PO CHEW
81.0000 mg | CHEWABLE_TABLET | Freq: Every day | ORAL | Status: DC
  Administered 2018-10-05: 81 mg via ORAL
  Filled 2018-10-03 (×3): qty 1

## 2018-10-03 MED ORDER — ONDANSETRON HCL 4 MG/2ML IJ SOLN: 4.0000 mg | Freq: Four times a day (QID) | INTRAMUSCULAR | Status: DC | PRN

## 2018-10-03 MED ORDER — IRBESARTAN 150 MG PO TABS
150.0000 mg | ORAL_TABLET | Freq: Every day | ORAL | Status: DC
  Administered 2018-10-04 – 2018-10-07 (×4): 150 mg via ORAL
  Filled 2018-10-03 (×5): qty 1

## 2018-10-03 MED ORDER — ASPIRIN 81 MG PO CHEW
324.0000 mg | CHEWABLE_TABLET | Freq: Once | ORAL | Status: AC
  Administered 2018-10-03: 324 mg via ORAL
  Filled 2018-10-03: qty 4

## 2018-10-03 MED ORDER — AMLODIPINE BESYLATE 2.5 MG PO TABS
2.5000 mg | ORAL_TABLET | Freq: Every day | ORAL | Status: DC
  Administered 2018-10-04 – 2018-10-07 (×4): 2.5 mg via ORAL
  Filled 2018-10-03 (×5): qty 1

## 2018-10-03 NOTE — Progress Notes (Signed)
ANTICOAGULATION CONSULT NOTE - Initial Consult  Pharmacy Consult for Heparin Indication: chest pain/ACS  Allergies  Allergen Reactions  . Penicillins Hives, Itching and Rash  . Rosuvastatin Other (See Comments)    REACTION: muscle weakness  . Simvastatin Other (See Comments)    REACTION: muscle weakness  . Statins Other (See Comments)    Myalgias and weakness  . Sulfonamide Derivatives Other (See Comments)    Nausea, vomiting, abd pain  . Sulfa Antibiotics Other (See Comments)    Nausea, vomiting, abd pain    Patient Measurements: Height: 5\' 4"  (162.6 cm) Weight: 170 lb (77.1 kg) IBW/kg (Calculated) : 54.7 Heparin Dosing Weight: 71 kg  Vital Signs: Temp: 98.1 F (36.7 C) (07/03 1100) Temp Source: Oral (07/03 1100) BP: 118/70 (07/03 1400) Pulse Rate: 63 (07/03 1400)  Labs: Recent Labs    10/03/18 1114 10/03/18 1258  HGB 13.9  --   HCT 41.7  --   PLT 264  --   CREATININE 0.50  --   TROPONINIHS 86* 104*    Estimated Creatinine Clearance: 70.5 mL/min (by C-G formula based on SCr of 0.5 mg/dL).   Medical History: Past Medical History:  Diagnosis Date  . Allergy    SEASONAL  . ANXIETY 10/28/2007  . Arthritis   . Cataracts, bilateral    immature  . Chronic back pain   . DIVERTICULOSIS, COLON 10/28/2007  . Dry skin    red patch on right knee area  . GERD 10/28/2007   takes Prevacid daily  . H/O hiatal hernia   . HLD (hyperlipidemia) 12/12/2017  . Hx of colonic polyps   . HYPERLIPIDEMIA 10/28/2007   unable to take meds d/t allergies  . HYPERTENSION 10/28/2007   takes Diovan daily  . Internal hemorrhoids   . Irritable bowel syndrome (IBS)   . Joint pain   . Joint swelling   . LIVER FUNCTION TESTS, ABNORMAL, HX OF 10/29/2007  . Lumbar disc disease   . Migraine   . Obesity   . OSTEOPENIA 10/29/2007  . Osteoporosis   . Presbyacusis 01/04/2010  . Rosacea   . Seasonal allergies    takes Clarinex daily  . Vertigo    hx of  . Vitamin D deficiency    takes  Vit D daily    Medications:  (Not in a hospital admission)   Assessment:  Goal of Therapy:  Heparin level 0.3-0.7 units/ml Monitor platelets by anticoagulation protocol: Yes   Plan:  Give 4000 units bolus x 1 Start heparin infusion at 850 units/hr Check anti-Xa level in 6 hours and daily while on heparin Continue to monitor H&H and platelets  Mallie Mussel A Abbygale Lapid 10/03/2018,2:26 PM

## 2018-10-03 NOTE — ED Triage Notes (Signed)
Chest pain x 1 week, intermittent. Burning pain, soreness into left arm.

## 2018-10-03 NOTE — ED Notes (Signed)
Pt requesting to use restroom prior to obtaining EKG

## 2018-10-03 NOTE — ED Notes (Signed)
Pt to XR at this time

## 2018-10-03 NOTE — H&P (Addendum)
History & Physical    Patient ID: Heather Merritt MRN: [id number], DOB/AGE: 65/05/1955   Admit date: 10/03/2018 Primary Physician: Biagio Borg, MD Primary Cardiologist: New  Patient Profile    Ms. Heather Merritt is a 63 year old female with a history of HLD, HTN, history of hiatal hernia, anxiety, and GERD who is being admitted for chest pain per cardiology service.  Past Medical History   Past Medical History:  Diagnosis Date  . Allergy    SEASONAL  . ANXIETY 10/28/2007  . Arthritis   . Cataracts, bilateral    immature  . Chronic back pain   . DIVERTICULOSIS, COLON 10/28/2007  . Dry skin    red patch on right knee area  . GERD 10/28/2007   takes Prevacid daily  . H/O hiatal hernia   . HLD (hyperlipidemia) 12/12/2017  . Hx of colonic polyps   . HYPERLIPIDEMIA 10/28/2007   unable to take meds d/t allergies  . HYPERTENSION 10/28/2007   takes Diovan daily  . Internal hemorrhoids   . Irritable bowel syndrome (IBS)   . Joint pain   . Joint swelling   . LIVER FUNCTION TESTS, ABNORMAL, HX OF 10/29/2007  . Lumbar disc disease   . Migraine   . Obesity   . OSTEOPENIA 10/29/2007  . Osteoporosis   . Presbyacusis 01/04/2010  . Rosacea   . Seasonal allergies    takes Clarinex daily  . Vertigo    hx of  . Vitamin D deficiency    takes Vit D daily    Past Surgical History:  Procedure Laterality Date  . ANTERIOR CERVICAL DECOMP/DISCECTOMY FUSION  08/02/2011   Procedure: ANTERIOR CERVICAL DECOMPRESSION/DISCECTOMY FUSION 3 LEVELS;  Surgeon: Elaina Hoops, MD;  Location: [facility];  Service: Neurosurgery;  Laterality: N/A;  Cervical Three-Four, Cervical Four-Five, Cervical Five-Six  Anterior Cervical Decompression Fusion   . COLONOSCOPY  2001/2013  . ESOPHAGOGASTRODUODENOSCOPY    . POLYPECTOMY    . TONSILLECTOMY    . TONSILLECTOMY  at age 27    Allergies  Allergies  Allergen Reactions  . Penicillins Hives, Itching and Rash  . Rosuvastatin Other (See Comments)    REACTION:  muscle weakness  . Simvastatin Other (See Comments)    REACTION: muscle weakness  . Statins Other (See Comments)    Myalgias and weakness  . Sulfonamide Derivatives Other (See Comments)    Nausea, vomiting, abd pain  . Sulfa Antibiotics Other (See Comments)    Nausea, vomiting, abd pain   History of Present Illness    Ms. Heather Merritt is a 63 year old female with a history stated above who presented to [facility] on 10/03/2018 with complaints of chest pain x 3 days which is described as a burning sensation in her throat and radiates to her left shoulder.  She states that she has recently been moving offices and began to notice her symptoms during this time.  She has a history of hiatal hernia and thought this was related.  She states her symptoms have been happening daily since Tuesday however became worrisome after she woke this morning at 6 AM with shoulder involvement.  She states her symptoms would worsen with exertion, moving boxes and would relieve with rest.  She had no associated symptoms including shortness of breath, nausea, vomiting, diaphoresis.  She has no prior history of CAD however has a family history of CAD in her mother.  She has occasional alcohol use and no illicit drug use.  She  is a remote smoker.  She presented to the ED for further evaluation.  In the ED, HsT found to be elevated at 86> 104.  EKG with NSR and TWI in lead V1 with no change from prior tracings from 06/19/2016.  There is no acute acute ischemic changes.  She is currently chest pain-free.  She was started on IV heparin per pharmacy.  Home medications include amlodipine, ASA 81, Diovan.   Of note, patient underwent a myocardial perfusion stress test on 06/30/2014 for resting chest pain with a known family history of CAD.  Home Medications    Prior to Admission medications   Medication Sig Start Date End Date Taking? Authorizing Provider  amLODipine (NORVASC) 2.5 MG tablet Take 1 tablet (2.5 mg  total) by mouth daily. Follow-up appt due in May must see provider for future refills 06/30/18  Yes Biagio Borg, MD  aspirin 81 MG tablet Take 81 mg by mouth daily.     Yes [provider]  atorvastatin (LIPITOR) 10 MG tablet TAKE 1 TABLET BY MOUTH  EVERY OTHER DAY 06/30/18  Yes Biagio Borg, MD  Calcium Citrate (CITRACAL PO) Take 2 tablets by mouth daily.    Yes [provider]  Cholecalciferol (VITAMIN D) 2000 UNITS CAPS Take 2,000 Units by mouth daily.   Yes [provider]  fish oil-omega-3 fatty acids 1000 MG capsule Take 1 g by mouth daily.    Yes [provider]  Multiple Vitamin (MULITIVITAMIN WITH MINERALS) TABS Take 1 tablet by mouth daily.   Yes [provider]  valsartan-hydrochlorothiazide (DIOVAN-HCT) 160-12.5 MG tablet Take 1 tablet by mouth daily. Follow-up appt due in May must see provider for future refills 06/30/18  Yes Biagio Borg, MD  vitamin E 400 UNIT capsule Take 400 Units by mouth daily.   Yes [provider]  desloratadine (CLARINEX) 5 MG tablet Take 1 tablet (5 mg total) by mouth daily. Patient taking differently: Take 5 mg by mouth as needed.  06/11/17   Biagio Borg, MD  esomeprazole (NEXIUM) 40 MG capsule Take 1 capsule (40 mg total) by mouth daily. 07/03/18   Biagio Borg, MD  ibuprofen (ADVIL,MOTRIN) 200 MG tablet Take 800 mg by mouth every 6 (six) hours as needed for moderate pain. Pain    [provider]  metroNIDAZOLE (METROGEL) 0.75 % gel Apply 1 application topically at bedtime. 06/11/17   Biagio Borg, MD  pimecrolimus (ELIDEL) 1 % cream Apply 1 application topically every morning. 06/11/17   Biagio Borg, MD    Family History    Family History  Problem Relation Age of Onset  . Atrial fibrillation Mother   . Hyperlipidemia Mother   . Stroke Mother   . Heart disease Mother   . Atrial fibrillation Father   . Hypothyroidism Sister   . Hypothyroidism Maternal Grandmother   . Diabetes Maternal  Grandmother   . Anesthesia problems Neg Hx   . Hypotension Neg Hx   . Malignant hyperthermia Neg Hx   . Pseudochol deficiency Neg Hx     Social History    Social History   Socioeconomic History  . Marital status: Married    Spouse name: Not on file  . Number of children: Not on file  . Years of education: Not on file  . Highest education level: Not on file  Occupational History  . Not on file  Social Needs  . Financial resource strain: Not on file  . Food  insecurity    Worry: Not on file    Inability: Not on file  . Transportation needs    Medical: Not on file    Non-medical: Not on file  Tobacco Use  . Smoking status: Former Research scientist (life sciences)  . Smokeless tobacco: Never Used  . Tobacco comment: quit in 33yrs ago  Substance and Sexual Activity  . Alcohol use: Yes    Alcohol/week: 0.0 standard drinks    Comment: occ beer/drink  . Drug use: No  . Sexual activity: Yes    Birth control/protection: Post-menopausal  Lifestyle  . Physical activity    Days per week: Not on file    Minutes per session: Not on file  . Stress: Not on file  Relationships  . Social Herbalist on phone: Not on file    Gets together: Not on file    Attends religious service: Not on file    Active member of club or organization: Not on file    Attends meetings of clubs or organizations: Not on file    Relationship status: Not on file  . Intimate partner violence    Fear of current or ex partner: Not on file    Emotionally abused: Not on file    Physically abused: Not on file    Forced sexual activity: Not on file  Other Topics Concern  . Not on file  Social History Narrative  . Not on file    Review of Systems   See HPI  All other systems reviewed and are otherwise negative except as noted above.  Physical Exam    Blood pressure 124/60, pulse 66, temperature 98.1 F (36.7 C), temperature source Oral, resp. rate 18, height 5\' 4"  (1.626 m), weight 77.1 kg, SpO2 100 %.   General:  Well developed, well nourished, NAD Skin: Warm, dry, intact  Neck: Negative for carotid bruits. No JVD Lungs:Clear to ausculation bilaterally. No wheezes, rales, or rhonchi. Breathing is unlabored. Cardiovascular: RRR with S1 S2. No murmurs, rubs, gallops, or LV heave appreciated. MSK: Strength and tone appear normal for age. 5/5 in all extremities Extremities: No edema. No clubbing or cyanosis. DP/PT pulses 2+ bilaterally Neuro: Alert and oriented. No focal deficits. No facial asymmetry. MAE spontaneously. Psych: Responds to questions appropriately with normal affect.    Labs    Troponin (Point of Care Test) No results for input(s): TROPIPOC in the last 72 hours. No results for input(s): CKTOTAL, CKMB, TROPONINI in the last 72 hours. Lab Results  Component Value Date   WBC 4.2 10/03/2018   HGB 13.9 10/03/2018   HCT 41.7 10/03/2018   MCV 90.8 10/03/2018   PLT 264 10/03/2018    Recent Labs  Lab 10/03/18 1114  NA 138  K 3.6  CL 102  CO2 26  BUN 12  CREATININE 0.50  CALCIUM 9.3  PROT 7.2  BILITOT 0.8  ALKPHOS 55  ALT 26  AST 24  GLUCOSE 108*   Lab Results  Component Value Date   CHOL 287 (H) 06/05/2017   HDL 45.40 06/05/2017   TRIG 201.0 (H) 06/05/2017   No results found for: [facility]   Radiology Studies    Dg Chest 2 View  Result Date: 10/03/2018 CLINICAL DATA:  Intermittent chest pain for 1 week EXAM: CHEST - 2 VIEW COMPARISON:  June 18, 2016 FINDINGS: The heart size and mediastinal contours are within normal limits. Minimal atelectasis or scar is identified in the left lung base. There is no focal infiltrate,  pulmonary edema, or pleural effusion. The visualized skeletal structures are stable. IMPRESSION: No active cardiopulmonary disease. Electronically Signed   By: Abelardo Diesel M.D.   On: 10/03/2018 11:45   ECG & Cardiac Imaging    EKG 10/03/2018 with NSR, TWI in lead V1 with no acute ischemic change when compared to prior tracing from 05/2016.  Exercise stress  testing 06/30/2014: See epic for imaging  Assessment & Plan    1. NSTEMI: -Patient presented to [facility] on 10[facility]03/2018 with complaints of chest pain x 3 days which is described as a burning sensation in her throat and radiates to her left shoulder -Her pain has had exertional qualities -EKG with no acute ischemic changes, there is T wave inversion in lead I however this is similar to prior tracing from 05/2016 -HsT elevated at 86> 104 -Patient underwent a stress test in 2016 for similar but less severe symptoms, she reports this was normal -Given stable renal function and heart rate, consider further ischemic evaluation with coronary CTA versus stress testing given borderline delta change and high-sensitivity troponin and clinical symptoms -Continue IV heparin  2. HTN: -Stable, 126/85>124/60>118/70 -On home amlodipine and Diovan -Will restart home medication regimen  3. HLD: -Last LDL, 177 from 06/05/2017 -Follows closely with PCP -Reports statin Monday Wednesdays and Fridays secondary to myalgias   Severity of Illness: The appropriate patient status for this patient is OBSERVATION. Observation status is judged to be reasonable and necessary in order to provide the required intensity of service to ensure the patient's safety. The patient's presenting symptoms, physical exam findings, and initial radiographic and laboratory data in the context of their medical condition is felt to place them at decreased risk for further clinical deterioration. Furthermore, it is anticipated that the patient will be medically stable for discharge from the hospital within 2 midnights of admission. The following factors support the patient status of observation.   " The patient's presenting symptoms include chest pain. " The physical exam findings include chest pain. " The initial radiographic and laboratory data are elevated high-sensitivity troponin.    Signed, Kathyrn Drown NP-C [facility]  Pager: [phone] 10/03/2018, @NOW   Chest pain with typical and atypical features  Myoview scan 2016 never interpreted but concerning to my experience by apical ischemia  GE reflux disease  High-sensitivity troponin elevated-intermediate with a delta  Hyperlipidemia-severe  The patient has chest pain with typical and atypical features.  Is rather prolonged aggravated by movement and relieved at rest with some radiation into the arms.  As noted, she has a very abnormal lipid panel, Myoview scan may have been abnormal 2016 and her troponins are not immediately reassuring.  ECG was without acute abnormality  Hence, have recommended noninvasive risk stratification and will undertake CTA.  Have reviewed this with the patient and she is aware that catheterization may be indicated  Interestingly, her father with similarly profound hyperlipidemia never developed coronary disease and died in his mid 64s.  Still however, I think it is likely she will have coronary disease and if so she may be appropriately considered for PCSK9 inhibition

## 2018-10-03 NOTE — ED Notes (Signed)
ED Provider at bedside. 

## 2018-10-03 NOTE — Progress Notes (Signed)
ANTICOAGULATION CONSULT NOTE - Initial Consult  Pharmacy Consult for Heparin Indication: chest pain/ACS  Allergies  Allergen Reactions  . Penicillins Hives, Itching and Rash  . Rosuvastatin Other (See Comments)    REACTION: muscle weakness  . Simvastatin Other (See Comments)    REACTION: muscle weakness  . Statins Other (See Comments)    Myalgias and weakness  . Sulfonamide Derivatives Other (See Comments)    Nausea, vomiting, abd pain  . Sulfa Antibiotics Other (See Comments)    Nausea, vomiting, abd pain    Patient Measurements: Height: 5\' 4"  (162.6 cm) Weight: 168 lb 12.8 oz (76.6 kg) IBW/kg (Calculated) : 54.7 Heparin Dosing Weight: 71 kg  Vital Signs: Temp: 98.3 F (36.8 C) (07/03 1715) Temp Source: Oral (07/03 1715) BP: 126/85 (07/03 1715) Pulse Rate: 72 (07/03 1715)  Labs: Recent Labs    10/03/18 1114 10/03/18 1258 10/03/18 2023  HGB 13.9  --   --   HCT 41.7  --   --   PLT 264  --   --   APTT 33  --   --   LABPROT 12.4  --   --   INR 0.9  --   --   HEPARINUNFRC  --   --  0.42  CREATININE 0.50  --   --   TROPONINIHS 86* 104*  --     Estimated Creatinine Clearance: 70.3 mL/min (by C-G formula based on SCr of 0.5 mg/dL).   Medical History: Past Medical History:  Diagnosis Date  . Allergy    SEASONAL  . ANXIETY 10/28/2007  . Arthritis   . Cataracts, bilateral    immature  . Chronic back pain   . DIVERTICULOSIS, COLON 10/28/2007  . Dry skin    red patch on right knee area  . GERD 10/28/2007   takes Prevacid daily  . H/O hiatal hernia   . HLD (hyperlipidemia) 12/12/2017  . Hx of colonic polyps   . HYPERLIPIDEMIA 10/28/2007   unable to take meds d/t allergies  . HYPERTENSION 10/28/2007   takes Diovan daily  . Internal hemorrhoids   . Irritable bowel syndrome (IBS)   . Joint pain   . Joint swelling   . LIVER FUNCTION TESTS, ABNORMAL, HX OF 10/29/2007  . Lumbar disc disease   . Migraine   . Obesity   . OSTEOPENIA 10/29/2007  . Osteoporosis    . Presbyacusis 01/04/2010  . Rosacea   . Seasonal allergies    takes Clarinex daily  . Vertigo    hx of  . Vitamin D deficiency    takes Vit D daily    Medications:  Medications Prior to Admission  Medication Sig Dispense Refill Last Dose  . amLODipine (NORVASC) 2.5 MG tablet Take 1 tablet (2.5 mg total) by mouth daily. Follow-up appt due in May must see provider for future refills 90 tablet 0 10/03/2018 at Unknown time  . aspirin 81 MG tablet Take 81 mg by mouth daily.     10/03/2018 at Unknown time  . atorvastatin (LIPITOR) 10 MG tablet TAKE 1 TABLET BY MOUTH  EVERY OTHER DAY 45 tablet 0 10/02/2018 at Unknown time  . Calcium Citrate (CITRACAL PO) Take 2 tablets by mouth daily.    10/02/2018 at Unknown time  . Cholecalciferol (VITAMIN D) 2000 UNITS CAPS Take 2,000 Units by mouth daily.   10/02/2018 at Unknown time  . fish oil-omega-3 fatty acids 1000 MG capsule Take 1 g by mouth daily.    10/02/2018 at Unknown time  .  Multiple Vitamin (MULITIVITAMIN WITH MINERALS) TABS Take 1 tablet by mouth daily.   10/02/2018 at Unknown time  . valsartan-hydrochlorothiazide (DIOVAN-HCT) 160-12.5 MG tablet Take 1 tablet by mouth daily. Follow-up appt due in May must see provider for future refills 90 tablet 0 10/03/2018 at Unknown time  . vitamin E 400 UNIT capsule Take 400 Units by mouth daily.   10/02/2018 at Unknown time  . desloratadine (CLARINEX) 5 MG tablet Take 1 tablet (5 mg total) by mouth daily. (Patient taking differently: Take 5 mg by mouth as needed. ) 90 tablet 3   . esomeprazole (NEXIUM) 40 MG capsule Take 1 capsule (40 mg total) by mouth daily. 90 capsule 3   . ibuprofen (ADVIL,MOTRIN) 200 MG tablet Take 800 mg by mouth every 6 (six) hours as needed for moderate pain. Pain     . metroNIDAZOLE (METROGEL) 0.75 % gel Apply 1 application topically at bedtime. 45 g 0   . pimecrolimus (ELIDEL) 1 % cream Apply 1 application topically every morning. 30 g 0     Assessment:  Initial heparin level therapeutic at  0.42  Goal of Therapy:  Heparin level 0.3-0.7 units/ml Monitor platelets by anticoagulation protocol: Yes   Plan:  Continue heparin drip at 850 units/hr Confirmatory heparin level with AM labs Daily heparin level  Fortune Torosian A. Levada Dy, PharmD, [place] Pager: [phone] Please utilize Amion for appropriate phone number to reach the unit pharmacist ([place])   10/03/2018,9:04 PM

## 2018-10-03 NOTE — ED Provider Notes (Signed)
[facility] Emergency Department Provider Note MRN:  [id number]  Arrival date & time: 10/03/18     Chief Complaint   Chest Pain   History of Present Illness   Heather Merritt is a 63 y.o. year-old female with a history of GERD, hiatal hernia, hyperlipidemia presenting to the ED with chief complaint of chest pain.  Pain is been present for 3 to 4 days, described as a burning sensation in the center of the chest in her "esophagus", radiates to her throat into her left shoulder.  Seems to worsen with large meals.  Had a good day yesterday with minimal pain because she stuck to small meals.  However this morning, she was woken from sleep at 6 AM with return of pain, which was unrelated to meals.  Denies fever, no cough, no dizziness or diaphoresis, no nausea or vomiting, no blood in stool or black stool, no shortness of breath, no abdominal pain.  Currently pain-free.  Review of Systems  A complete 10 system review of systems was obtained and all systems are negative except as noted in the HPI and PMH.   Patient's Health History    Past Medical History:  Diagnosis Date  . Allergy    SEASONAL  . ANXIETY 10/28/2007  . Arthritis   . Cataracts, bilateral    immature  . Chronic back pain   . DIVERTICULOSIS, COLON 10/28/2007  . Dry skin    red patch on right knee area  . GERD 10/28/2007   takes Prevacid daily  . H/O hiatal hernia   . HLD (hyperlipidemia) 12/12/2017  . Hx of colonic polyps   . HYPERLIPIDEMIA 10/28/2007   unable to take meds d/t allergies  . HYPERTENSION 10/28/2007   takes Diovan daily  . Internal hemorrhoids   . Irritable bowel syndrome (IBS)   . Joint pain   . Joint swelling   . LIVER FUNCTION TESTS, ABNORMAL, HX OF 10/29/2007  . Lumbar disc disease   . Migraine   . Obesity   . OSTEOPENIA 10/29/2007  . Osteoporosis   . Presbyacusis 01/04/2010  . Rosacea   . Seasonal allergies    takes Clarinex daily  . Vertigo    hx of  . Vitamin D  deficiency    takes Vit D daily    Past Surgical History:  Procedure Laterality Date  . ANTERIOR CERVICAL DECOMP/DISCECTOMY FUSION  08/02/2011   Procedure: ANTERIOR CERVICAL DECOMPRESSION/DISCECTOMY FUSION 3 LEVELS;  Surgeon: Elaina Hoops, MD;  Location: [place] [facility];  Service: Neurosurgery;  Laterality: N/A;  Cervical Three-Four, Cervical Four-Five, Cervical Five-Six  Anterior Cervical Decompression Fusion   . COLONOSCOPY  2001/2013  . ESOPHAGOGASTRODUODENOSCOPY    . POLYPECTOMY    . TONSILLECTOMY    . TONSILLECTOMY  at age 5    Family History  Problem Relation Age of Onset  . Atrial fibrillation Mother   . Hyperlipidemia Mother   . Stroke Mother   . Heart disease Mother   . Atrial fibrillation Father   . Hypothyroidism Sister   . Hypothyroidism Maternal Grandmother   . Diabetes Maternal Grandmother   . Anesthesia problems Neg Hx   . Hypotension Neg Hx   . Malignant hyperthermia Neg Hx   . Pseudochol deficiency Neg Hx     Social History   Socioeconomic History  . Marital status: Married    Spouse name: Not on file  . Number of children: Not on file  . Years of education: Not on  file  . Highest education level: Not on file  Occupational History  . Not on file  Social Needs  . Financial resource strain: Not on file  . Food insecurity    Worry: Not on file    Inability: Not on file  . Transportation needs    Medical: Not on file    Non-medical: Not on file  Tobacco Use  . Smoking status: Former Research scientist (life sciences)  . Smokeless tobacco: Never Used  . Tobacco comment: quit in 32yrs ago  Substance and Sexual Activity  . Alcohol use: Yes    Alcohol/week: 0.0 standard drinks    Comment: occ beer/drink  . Drug use: No  . Sexual activity: Yes    Birth control/protection: Post-menopausal  Lifestyle  . Physical activity    Days per week: Not on file    Minutes per session: Not on file  . Stress: Not on file  Relationships  . Social Herbalist on phone: Not on file     Gets together: Not on file    Attends religious service: Not on file    Active member of club or organization: Not on file    Attends meetings of clubs or organizations: Not on file    Relationship status: Not on file  . Intimate partner violence    Fear of current or ex partner: Not on file    Emotionally abused: Not on file    Physically abused: Not on file    Forced sexual activity: Not on file  Other Topics Concern  . Not on file  Social History Narrative  . Not on file     Physical Exam  Vital Signs and Nursing Notes reviewed Vitals:   10/03/18 1330 10/03/18 1400  BP: 128/72 118/70  Pulse:  63  Resp: 14 16  Temp:    SpO2:  96%    CONSTITUTIONAL: Well-appearing, NAD NEURO:  Alert and oriented x 3, no focal deficits EYES:  eyes equal and reactive ENT/NECK:  no LAD, no JVD CARDIO: Regular rate, well-perfused, normal S1 and S2 PULM:  CTAB no wheezing or rhonchi GI/GU:  normal bowel sounds, non-distended, non-tender MSK/SPINE:  No gross deformities, no edema SKIN:  no rash, atraumatic PSYCH:  Appropriate speech and behavior  Diagnostic and Interventional Summary    EKG Interpretation  Date/Time:  Friday October 03 2018 11:44:44 EDT Ventricular Rate:  66 PR Interval:    QRS Duration: 81 QT Interval:  429 QTC Calculation: 450 R Axis:   60 Text Interpretation:  Sinus rhythm Anterior infarct, old Confirmed by Gerlene Fee [phone]) on 10/03/2018 2:09:29 PM      Labs Reviewed  COMPREHENSIVE METABOLIC PANEL - Abnormal; Notable for the following components:      Result Value   Glucose, Bld 108 (*)    All other components within normal limits  TROPONIN I (HIGH SENSITIVITY) - Abnormal; Notable for the following components:   Troponin I (High Sensitivity) 86 (*)    All other components within normal limits  TROPONIN I (HIGH SENSITIVITY) - Abnormal; Notable for the following components:   Troponin I (High Sensitivity) 104 (*)    All other components within normal limits   SARS CORONAVIRUS 2 (HOSP ORDER, PERFORMED IN  LAB VIA [facility]OTT ID)  CBC  APTT  PROTIME-INR    DG Chest 2 View  Final Result      Medications  heparin bolus via infusion 4,000 Units (has no administration in time range)  heparin ADULT  infusion 100 units/mL (25000 units/211mL sodium chloride 0.45%) (850 Units/hr Intravenous New Bag/Given 10/03/18 1431)  aspirin chewable tablet 324 mg (324 mg Oral Given 10/03/18 1428)     Procedures Critical Care Critical Care Documentation Critical care time provided by me (excluding procedures): 36 minutes  Condition necessitating critical care: Acute coronary syndrome, non-ST elevation myocardial infarction  Components of critical care management: reviewing of prior records, laboratory and imaging interpretation, frequent re-examination and reassessment of vital signs, administration of aspirin, IV heparin, discussion with consulting services.    ED Course and Medical Decision Making  I have reviewed the triage vital signs and the nursing notes.  Pertinent labs & imaging results that were available during my care of the patient were reviewed by me and considered in my medical decision making (see below for details).  Suspect GERD or hiatal hernia related pain in this 63 year old female who otherwise has minimal cardiac risk factors.  Pain is worse with large meals, seems to be worse when laying flat.  Work-up is pending, will screen for ACS with EKG and troponin.  High-sensitivity troponins reveal first value of 80, next value of over 100, concerning for cardiac etiology, possible NSTEMI.  Provided with aspirin, heparin, discussed with Dr. Caryl Comes cardiology, who accepts for admission at [facility], transport is pending.  Barth Kirks. Sedonia Small, MD [place] [email or web address]  Final Clinical Impressions(s) / ED Diagnoses     ICD-10-CM   1. NSTEMI (non-ST elevated myocardial infarction) ([place])   I21.4   2. Chest pain  R07.9 DG Chest 2 View    DG Chest 2 View    ED Discharge Orders    None         Maudie Flakes, MD 10/03/18 1432

## 2018-10-03 NOTE — ED Notes (Signed)
ED TO INPATIENT HANDOFF REPORT  ED Nurse Name and Phone #: Lurene Shadow, RN  [phone]  S Name/Age/Gender Heather Merritt 63 y.o. female Room/Bed: MHOTF/OTF  Code Status   Code Status: Not on file  Home/SNF/Other Home Patient oriented to: self, place, time and situation Is this baseline? Yes   Triage Complete: Triage complete  Chief Complaint NECK AND UPPER CHEST PAIN AND BURNING, LEFT ARM PAIN  Triage Note Chest pain x 1 week, intermittent. Burning pain, soreness into left arm.   Allergies Allergies  Allergen Reactions  . Penicillins Hives, Itching and Rash  . Rosuvastatin Other (See Comments)    REACTION: muscle weakness  . Simvastatin Other (See Comments)    REACTION: muscle weakness  . Statins Other (See Comments)    Myalgias and weakness  . Sulfonamide Derivatives Other (See Comments)    Nausea, vomiting, abd pain  . Sulfa Antibiotics Other (See Comments)    Nausea, vomiting, abd pain    Level of Care/Admitting Diagnosis ED Disposition    ED Disposition Condition Comment   Admit  Hospital Area: [facility] [[id number]]  Level of Care: Telemetry Cardiac [103]  Covid Evaluation: Asymptomatic Screening Protocol (No Symptoms)  Diagnosis: NSTEMI (non-ST elevated myocardial infarction) [facility]) [[id number]]  Admitting Physician: Gardiner Barefoot  Attending Physician: Deboraha Sprang [phone]  Estimated length of stay: past midnight tomorrow  Certification:: I certify this patient will need inpatient services for at least 2 midnights  PT Class (Do Not Modify): Inpatient [101]  PT Acc Code (Do Not Modify): Private [1]       B Medical/Surgery History Past Medical History:  Diagnosis Date  . Allergy    SEASONAL  . ANXIETY 10/28/2007  . Arthritis   . Cataracts, bilateral    immature  . Chronic back pain   . DIVERTICULOSIS, COLON 10/28/2007  . Dry skin    red patch on right knee area  . GERD 10/28/2007   takes Prevacid daily  . H/O hiatal  hernia   . HLD (hyperlipidemia) 12/12/2017  . Hx of colonic polyps   . HYPERLIPIDEMIA 10/28/2007   unable to take meds d/t allergies  . HYPERTENSION 10/28/2007   takes Diovan daily  . Internal hemorrhoids   . Irritable bowel syndrome (IBS)   . Joint pain   . Joint swelling   . LIVER FUNCTION TESTS, ABNORMAL, HX OF 10/29/2007  . Lumbar disc disease   . Migraine   . Obesity   . OSTEOPENIA 10/29/2007  . Osteoporosis   . Presbyacusis 01/04/2010  . Rosacea   . Seasonal allergies    takes Clarinex daily  . Vertigo    hx of  . Vitamin D deficiency    takes Vit D daily   Past Surgical History:  Procedure Laterality Date  . ANTERIOR CERVICAL DECOMP/DISCECTOMY FUSION  08/02/2011   Procedure: ANTERIOR CERVICAL DECOMPRESSION/DISCECTOMY FUSION 3 LEVELS;  Surgeon: Elaina Hoops, MD;  Location: [place] [facility];  Service: Neurosurgery;  Laterality: N/A;  Cervical Three-Four, Cervical Four-Five, Cervical Five-Six  Anterior Cervical Decompression Fusion   . COLONOSCOPY  2001/2013  . ESOPHAGOGASTRODUODENOSCOPY    . POLYPECTOMY    . TONSILLECTOMY    . TONSILLECTOMY  at age 37     A IV Location/Drains/Wounds Patient Lines/Drains/Airways Status   Active Line/Drains/Airways    Name:   Placement date:   Placement time:   Site:   Days:   Peripheral IV 10/03/18 Left Antecubital   10/03/18  1105    Antecubital   less than 1   Peripheral IV 10/03/18 Left Hand   10/03/18    1440    Hand   less than 1   Closed System Drain 1 Neck Bulb (JP) 7 Fr.   08/02/11    0957    Neck   2619   Incision 08/02/11 Neck Bilateral   08/02/11    0824     2619          Intake/Output Last 24 hours No intake or output data in the 24 hours ending 10/03/18 1643  Labs/Imaging Results for orders placed or performed during the hospital encounter of 10/03/18 (from the past 48 hour(s))  CBC     Status: None   Collection Time: 10/03/18 11:14 AM  Result Value Ref Range   WBC 4.2 4.0 - 10.5 K/uL   RBC 4.59 3.87 - 5.11 MIL/uL    Hemoglobin 13.9 12.0 - 15.0 g/dL   HCT 41.7 36.0 - 46.0 %   MCV 90.8 80.0 - 100.0 fL   MCH 30.3 26.0 - 34.0 pg   MCHC 33.3 30.0 - 36.0 g/dL   RDW 12.1 11.5 - 15.5 %   Platelets 264 150 - 400 K/uL   nRBC 0.0 0.0 - 0.2 %    Comment: Performed at [facility], [address] [place], [place] [address]  Comprehensive metabolic panel     Status: Abnormal   Collection Time: 10/03/18 11:14 AM  Result Value Ref Range   Sodium 138 135 - 145 mmol/L   Potassium 3.6 3.5 - 5.1 mmol/L   Chloride 102 98 - 111 mmol/L   CO2 26 22 - 32 mmol/L   Glucose, Bld 108 (H) 70 - 99 mg/dL   BUN 12 8 - 23 mg/dL   Creatinine, Ser 0.50 0.44 - 1.00 mg/dL   Calcium 9.3 8.9 - 10.3 mg/dL   Total Protein 7.2 6.5 - 8.1 g/dL   Albumin 4.3 3.5 - 5.0 g/dL   AST 24 15 - 41 U/L   ALT 26 0 - 44 U/L   Alkaline Phosphatase 55 38 - 126 U/L   Total Bilirubin 0.8 0.3 - 1.2 mg/dL   GFR calc non Af Amer >60 >60 mL/min   GFR calc Af Amer >60 >60 mL/min   Anion gap 10 5 - 15    Comment: Performed at [facility], [address] [place], [place] [address]  Troponin I (High Sensitivity)     Status: Abnormal   Collection Time: 10/03/18 11:14 AM  Result Value Ref Range   Troponin I (High Sensitivity) 86 (H) <18 ng/L    Comment: (NOTE) Elevated high sensitivity troponin I (hsTnI) values and significant  changes across serial measurements may suggest ACS but many other  chronic and acute conditions are known to elevate hsTnI results.  Refer to the "Links" section for chest pain algorithms and additional  guidance. Performed at [facility], [address] [place], [place] [address]   APTT     Status: None   Collection Time: 10/03/18 11:14 AM  Result Value Ref Range   aPTT 33 24 - 36 seconds    Comment: Performed at [facility], [address] [place], [place] [address]  Protime-INR     Status: None   Collection Time: 10/03/18 11:14 AM  Result Value Ref Range   Prothrombin  Time 12.4 11.4 - 15.2 seconds  INR 0.9 0.8 - 1.2    Comment: (NOTE) INR goal varies based on device and disease states. Performed at [facility], [address] [place], [place] [address]   Troponin I (High Sensitivity)     Status: Abnormal   Collection Time: 10/03/18 12:58 PM  Result Value Ref Range   Troponin I (High Sensitivity) 104 (HH) <18 ng/L    Comment: DELTA CHECK NOTED CRITICAL RESULT CALLED TO, READ BACK BY AND VERIFIED WITH: Tiah Heckel RN @1405  10/03/2018 OLSONM (NOTE) Elevated high sensitivity troponin I (hsTnI) values and significant  changes across serial measurements may suggest ACS but many other  chronic and acute conditions are known to elevate hsTnI results.  Refer to the Links section for chest pain algorithms and additional  guidance. Performed at [facility], [address] [place], [place] [address]   SARS Coronavirus 2 (Hosp order,Performed in [facility] lab via [facility] ID)     Status: None   Collection Time: 10/03/18  2:41 PM   Specimen: Dry Nasal Swab (A[facility]ott ID Now)  Result Value Ref Range   SARS Coronavirus 2 (Abbott ID Now) NEGATIVE NEGATIVE    Comment: (NOTE) SARS-CoV-2 target nucleic acids are NOT DETECTED. The SARS-CoV-2 RNA is generally detectable in upper and lower respiratory specimens during the acute phase of infection.  Negativeresults do not preclude SARS-CoV-2 infection, do not rule out coinfections with other pathogens, and should not be used as the  sole basis for treatment or other patient management decisions.  Negative results must be combined with clinical observations, patient history, and epidemiological information. The expected result is Negative. Fact Sheet for Patients: GolfingFamily.no Fact Sheet for Healthcare Providers: https://www.hernandez-brewer.com/ This test is not yet approved or cleared by the [place] FDA and  has been authorized for detection  and/or diagnosis of SARS-CoV-2 by FDA under an Emergency Use Authorization (EUA).  This EUA will remain in effect (meaning this test can be used) for the duration of  the COVID19 declaration under Section 5 64(b)(1) of the Act, 21 U.S.C.  section [phone] 3(b)(1), unless the authorization is terminated or revoked sooner. Performed at [facility], [address] [place], [place] [address]    Dg Chest 2 View  Result Date: 10/03/2018 CLINICAL DATA:  Intermittent chest pain for 1 week EXAM: CHEST - 2 VIEW COMPARISON:  June 18, 2016 FINDINGS: The heart size and mediastinal contours are within normal limits. Minimal atelectasis or scar is identified in the left lung base. There is no focal infiltrate, pulmonary edema, or pleural effusion. The visualized skeletal structures are stable. IMPRESSION: No active cardiopulmonary disease. Electronically Signed   By: Abelardo Diesel M.D.   On: 10/03/2018 11:45    Pending Labs Unresulted Labs (From admission, onward)   None      Vitals/Pain Today's Vitals   10/03/18 1330 10/03/18 1400 10/03/18 1430 10/03/18 1500  BP: 128/72 118/70 (!) 141/120 124/60  Pulse:  63 86 66  Resp: 14 16 17 18   Temp:      TempSrc:      SpO2:  96% 100% 100%  Weight:  77.1 kg    Height:  5\' 4"  (1.626 m)    PainSc:        Isolation Precautions No active isolations  Medications Medications  heparin ADULT infusion 100 units/mL (25000 units/238mL sodium chloride 0.45%) (850 Units/hr Intravenous Transfusing/Transfer 10/03/18 1556)  aspirin chewable tablet 324 mg (324 mg Oral Given 10/03/18 1428)  heparin bolus via infusion 4,000 Units (4,000 Units Intravenous Bolus from Bag 10/03/18 1431)    Mobility walks Low fall risk   Focused Assessments Cardiac Assessment Handoff:  Cardiac Rhythm: Normal sinus rhythm Lab Results  Component Value Date   TROPONINI <0.03 06/19/2016   No results found for: DDIMER Does the Patient currently have chest pain? No      R Recommendations: See Admitting Provider Note  Report given to: Minette Brine, RN  Additional Notes:

## 2018-10-04 ENCOUNTER — Observation Stay (HOSPITAL_COMMUNITY): Payer: Medicare Other

## 2018-10-04 DIAGNOSIS — I10 Essential (primary) hypertension: Secondary | ICD-10-CM | POA: Diagnosis present

## 2018-10-04 DIAGNOSIS — K449 Diaphragmatic hernia without obstruction or gangrene: Secondary | ICD-10-CM | POA: Diagnosis present

## 2018-10-04 DIAGNOSIS — Z20828 Contact with and (suspected) exposure to other viral communicable diseases: Secondary | ICD-10-CM | POA: Diagnosis present

## 2018-10-04 DIAGNOSIS — Z882 Allergy status to sulfonamides status: Secondary | ICD-10-CM | POA: Diagnosis not present

## 2018-10-04 DIAGNOSIS — Z888 Allergy status to other drugs, medicaments and biological substances status: Secondary | ICD-10-CM | POA: Diagnosis not present

## 2018-10-04 DIAGNOSIS — J302 Other seasonal allergic rhinitis: Secondary | ICD-10-CM | POA: Diagnosis present

## 2018-10-04 DIAGNOSIS — Z8349 Family history of other endocrine, nutritional and metabolic diseases: Secondary | ICD-10-CM | POA: Diagnosis not present

## 2018-10-04 DIAGNOSIS — I214 Non-ST elevation (NSTEMI) myocardial infarction: Secondary | ICD-10-CM | POA: Diagnosis present

## 2018-10-04 DIAGNOSIS — Z823 Family history of stroke: Secondary | ICD-10-CM | POA: Diagnosis not present

## 2018-10-04 DIAGNOSIS — Y92230 Patient room in hospital as the place of occurrence of the external cause: Secondary | ICD-10-CM | POA: Diagnosis not present

## 2018-10-04 DIAGNOSIS — M810 Age-related osteoporosis without current pathological fracture: Secondary | ICD-10-CM | POA: Diagnosis present

## 2018-10-04 DIAGNOSIS — Z880 Allergy status to penicillin: Secondary | ICD-10-CM | POA: Diagnosis not present

## 2018-10-04 DIAGNOSIS — Z8249 Family history of ischemic heart disease and other diseases of the circulatory system: Secondary | ICD-10-CM | POA: Diagnosis not present

## 2018-10-04 DIAGNOSIS — Z7982 Long term (current) use of aspirin: Secondary | ICD-10-CM | POA: Diagnosis not present

## 2018-10-04 DIAGNOSIS — Z87891 Personal history of nicotine dependence: Secondary | ICD-10-CM | POA: Diagnosis not present

## 2018-10-04 DIAGNOSIS — E559 Vitamin D deficiency, unspecified: Secondary | ICD-10-CM | POA: Diagnosis present

## 2018-10-04 DIAGNOSIS — K219 Gastro-esophageal reflux disease without esophagitis: Secondary | ICD-10-CM | POA: Diagnosis present

## 2018-10-04 DIAGNOSIS — I25110 Atherosclerotic heart disease of native coronary artery with unstable angina pectoris: Secondary | ICD-10-CM | POA: Diagnosis present

## 2018-10-04 DIAGNOSIS — I952 Hypotension due to drugs: Secondary | ICD-10-CM | POA: Diagnosis not present

## 2018-10-04 DIAGNOSIS — I2510 Atherosclerotic heart disease of native coronary artery without angina pectoris: Secondary | ICD-10-CM

## 2018-10-04 DIAGNOSIS — T463X5A Adverse effect of coronary vasodilators, initial encounter: Secondary | ICD-10-CM | POA: Diagnosis not present

## 2018-10-04 DIAGNOSIS — Z79899 Other long term (current) drug therapy: Secondary | ICD-10-CM | POA: Diagnosis not present

## 2018-10-04 DIAGNOSIS — R070 Pain in throat: Secondary | ICD-10-CM | POA: Diagnosis present

## 2018-10-04 DIAGNOSIS — E785 Hyperlipidemia, unspecified: Secondary | ICD-10-CM | POA: Diagnosis present

## 2018-10-04 LAB — BASIC METABOLIC PANEL
Anion gap: 8 (ref 5–15)
BUN: 14 mg/dL (ref 8–23)
CO2: 27 mmol/L (ref 22–32)
Calcium: 9.1 mg/dL (ref 8.9–10.3)
Chloride: 103 mmol/L (ref 98–111)
Creatinine, Ser: 0.65 mg/dL (ref 0.44–1.00)
GFR calc Af Amer: >60 mL/min (ref >60)
GFR calc non Af Amer: >60 mL/min (ref >60)
Glucose, Bld: 104 mg/dL — ABNORMAL HIGH (ref 70–99)
Potassium: 3.7 mmol/L (ref 3.5–5.1)
Sodium: 138 mmol/L (ref 135–145)

## 2018-10-04 LAB — CBC
HCT: 39.1 % (ref 36.0–46.0)
Hemoglobin: 13.1 g/dL (ref 12.0–15.0)
MCH: 30.2 pg (ref 26.0–34.0)
MCHC: 33.5 g/dL (ref 30.0–36.0)
MCV: 90.1 fL (ref 80.0–100.0)
Platelets: 227 10*3/uL (ref 150–400)
RBC: 4.34 MIL/uL (ref 3.87–5.11)
RDW: 12.1 % (ref 11.5–15.5)
WBC: 4.7 10*3/uL (ref 4.0–10.5)
nRBC: 0 % (ref 0.0–0.2)

## 2018-10-04 LAB — LIPID PANEL
Cholesterol: 204 mg/dL — ABNORMAL HIGH (ref 0–200)
HDL: 36 mg/dL — ABNORMAL LOW (ref >40)
LDL Cholesterol: 112 mg/dL — ABNORMAL HIGH (ref 0–99)
Total CHOL/HDL Ratio: 5.7 RATIO
Triglycerides: 280 mg/dL — ABNORMAL HIGH (ref <150)
VLDL: 56 mg/dL — ABNORMAL HIGH (ref 0–40)

## 2018-10-04 LAB — HEPARIN LEVEL (UNFRACTIONATED): Heparin Unfractionated: 0.38 IU/mL (ref 0.30–0.70)

## 2018-10-04 LAB — TROPONIN I (HIGH SENSITIVITY): Troponin I (High Sensitivity): 130 ng/L (ref <18)

## 2018-10-04 MED ORDER — PANTOPRAZOLE SODIUM 40 MG PO TBEC
40.0000 mg | DELAYED_RELEASE_TABLET | Freq: Every day | ORAL | Status: DC
  Administered 2018-10-04 – 2018-10-07 (×4): 40 mg via ORAL
  Filled 2018-10-04 (×4): qty 1

## 2018-10-04 MED ORDER — HEPARIN (PORCINE) 25000 UT/250ML-% IV SOLN: 10.0000 [IU]/kg/h | INTRAVENOUS | Status: DC

## 2018-10-04 MED ORDER — IOHEXOL 350 MG/ML SOLN
90.0000 mL | Freq: Once | INTRAVENOUS | Status: AC | PRN
  Administered 2018-10-04: 90 mL via INTRAVENOUS

## 2018-10-04 MED ORDER — NITROGLYCERIN 0.4 MG SL SUBL
SUBLINGUAL_TABLET | SUBLINGUAL | Status: AC
  Administered 2018-10-04: 11:00:00
  Filled 2018-10-04: qty 2

## 2018-10-04 MED ORDER — ATORVASTATIN CALCIUM 10 MG PO TABS
10.0000 mg | ORAL_TABLET | Freq: Every day | ORAL | Status: DC
  Administered 2018-10-04 – 2018-10-06 (×3): 10 mg via ORAL
  Filled 2018-10-04 (×3): qty 1

## 2018-10-04 MED ORDER — ATORVASTATIN CALCIUM 10 MG PO TABS: 10.0000 mg | ORAL_TABLET | Freq: Every day | ORAL | Status: DC

## 2018-10-04 MED ORDER — PANTOPRAZOLE SODIUM 40 MG PO TBEC: 40.0000 mg | DELAYED_RELEASE_TABLET | Freq: Two times a day (BID) | ORAL | Status: DC

## 2018-10-04 MED ORDER — HEPARIN (PORCINE) 25000 UT/250ML-% IV SOLN
850.0000 [IU]/h | INTRAVENOUS | Status: DC
  Administered 2018-10-04 – 2018-10-05 (×2): 850 [IU]/h via INTRAVENOUS
  Filled 2018-10-04 (×2): qty 250

## 2018-10-04 MED ORDER — METOPROLOL TARTRATE 25 MG PO TABS
25.0000 mg | ORAL_TABLET | Freq: Once | ORAL | Status: AC
  Administered 2018-10-04: 25 mg via ORAL
  Filled 2018-10-04: qty 1

## 2018-10-04 MED ORDER — CALCIUM CARBONATE ANTACID 500 MG PO CHEW
400.0000 mg | CHEWABLE_TABLET | ORAL | Status: DC | PRN
  Administered 2018-10-05: 400 mg via ORAL
  Filled 2018-10-04 (×2): qty 2

## 2018-10-04 NOTE — Progress Notes (Signed)
Pt with indigestion, I ordered protonix and tums but sounds similar to admit complaint so ordered HS troponin.

## 2018-10-04 NOTE — Progress Notes (Signed)
CRITICAL VALUE ALERT  Critical Value:  Trop 130  Date & Time Notied:  10/04/2018 at 1003  Provider Notified: Dorene Ar, NP  Orders Received/Actions taken: Pending. Patient resting comfortably in bed. Will continue to monitor.

## 2018-10-04 NOTE — Progress Notes (Signed)
Progress Note  Patient Name: Heather Merritt Date of Encounter: 10/04/2018  Primary Cardiologist: No primary care provider on file.   Subjective   No chest pain or sob.   Inpatient Medications    Scheduled Meds: . amLODipine  2.5 mg Oral Daily  . aspirin  81 mg Oral Daily  . aspirin  300 mg Rectal NOW  . irbesartan  150 mg Oral Daily   And  . hydrochlorothiazide  12.5 mg Oral Daily  . metoprolol tartrate  25 mg Oral Once  . pantoprazole  40 mg Oral Daily   Continuous Infusions: . heparin 850 Units/hr (10/04/18 0819)   PRN Meds: acetaminophen, calcium carbonate, nitroGLYCERIN, ondansetron (ZOFRAN) IV   Vital Signs    Vitals:   10/03/18 1500 10/03/18 1715 10/03/18 2106 10/04/18 0608  BP: 124/60 126/85 133/75 138/78  Pulse: 66 72 70 66  Resp: 18  16 16   Temp:  98.3 F (36.8 C) 98.2 F (36.8 C) 98.3 F (36.8 C)  TempSrc:  Oral Oral Oral  SpO2: 100% 97% 93% 98%  Weight:  76.6 kg  76.1 kg  Height:  5\' 4"  (1.626 m)      Intake/Output Summary (Last 24 hours) at 10/04/2018 1012 Last data filed at 10/04/2018 0819 Gross per 24 hour  Intake 430.87 ml  Output -  Net 430.87 ml   Filed Weights   10/03/18 1400 10/03/18 1715 10/04/18 0608  Weight: 77.1 kg 76.6 kg 76.1 kg    Telemetry    nsr - Personally Reviewed  ECG    none - Personally Reviewed  Physical Exam   GEN: No acute distress.   Neck: No JVD Cardiac: RRR Respiratory: No increased work of breathing GI: non-distended  MS: No edema; No deformity. Neuro:  Nonfocal  Psych: Normal affect   Labs    Chemistry Recent Labs  Lab 10/03/18 1114 10/04/18 0312  NA 138 138  K 3.6 3.7  CL 102 103  CO2 26 27  GLUCOSE 108* 104*  BUN 12 14  CREATININE 0.50 0.65  CALCIUM 9.3 9.1  PROT 7.2  --   ALBUMIN 4.3  --   AST 24  --   ALT 26  --   ALKPHOS 55  --   BILITOT 0.8  --   GFRNONAA >60 >60  GFRAA >60 >60  ANIONGAP 10 8     Hematology Recent Labs  Lab 10/03/18 1114 10/04/18 0312  WBC 4.2  4.7  RBC 4.59 4.34  HGB 13.9 13.1  HCT 41.7 39.1  MCV 90.8 90.1  MCH 30.3 30.2  MCHC 33.3 33.5  RDW 12.1 12.1  PLT 264 227    Cardiac EnzymesNo results for input(s): TROPONINI in the last 168 hours. No results for input(s): TROPIPOC in the last 168 hours.   BNPNo results for input(s): BNP, PROBNP in the last 168 hours.   DDimer No results for input(s): DDIMER in the last 168 hours.   Radiology    Dg Chest 2 View  Result Date: 10/03/2018 CLINICAL DATA:  Intermittent chest pain for 1 week EXAM: CHEST - 2 VIEW COMPARISON:  June 18, 2016 FINDINGS: The heart size and mediastinal contours are within normal limits. Minimal atelectasis or scar is identified in the left lung base. There is no focal infiltrate, pulmonary edema, or pleural effusion. The visualized skeletal structures are stable. IMPRESSION: No active cardiopulmonary disease. Electronically Signed   By: Abelardo Diesel M.D.   On: 10/03/2018 11:45    Cardiac Studies  none  Patient Profile     63 y.o. female admitted with chest pain and multiple cardiac risk factors, pending CT coronary angio which was ordered for today.  Assessment & Plan    1. Chest pain - troponin elevated. CT angio is pending. If unable to obtain today, we will cath on Monday. 2. HTN - her pressures are fairly well controlled. We will follow.  For questions or updates, please contact [place] Please consult [email or web address] for contact info under Cardiology/STEMI.      Signed, Cristopher Peru, MD  10/04/2018, 10:12 AM  Patient ID: Heather Merritt, female   DOB: [birth year], 63 y.o.   MRN: [id number]

## 2018-10-04 NOTE — Progress Notes (Signed)
ANTICOAGULATION CONSULT NOTE - Follow up [place] for Heparin Indication: chest pain/ACS  Allergies  Allergen Reactions  . Penicillins Hives, Itching and Rash  . Rosuvastatin Other (See Comments)    REACTION: muscle weakness  . Simvastatin Other (See Comments)    REACTION: muscle weakness  . Statins Other (See Comments)    Myalgias and weakness  . Sulfonamide Derivatives Other (See Comments)    Nausea, vomiting, abd pain  . Sulfa Antibiotics Other (See Comments)    Nausea, vomiting, abd pain    Patient Measurements: Height: 5\' 4"  (162.6 cm) Weight: 167 lb 12.8 oz (76.1 kg) IBW/kg (Calculated) : 54.7 Heparin Dosing Weight: 71 kg  Vital Signs: Temp: 98.3 F (36.8 C) (07/04 0608) Temp Source: Oral (07/04 0608) BP: 138/78 (07/04 0608) Pulse Rate: 66 (07/04 0608)  Labs: Recent Labs    10/03/18 1114 10/03/18 1258 10/03/18 2023 10/04/18 0312  HGB 13.9  --   --  13.1  HCT 41.7  --   --  39.1  PLT 264  --   --  227  APTT 33  --   --   --   LABPROT 12.4  --   --   --   INR 0.9  --   --   --   HEPARINUNFRC  --   --  0.42 0.38  CREATININE 0.50  --   --  0.65  TROPONINIHS 86* 104*  --   --     Estimated Creatinine Clearance: 70.1 mL/min (by C-G formula based on SCr of 0.65 mg/dL).   Medical History: Past Medical History:  Diagnosis Date  . Allergy    SEASONAL  . ANXIETY 10/28/2007  . Arthritis   . Cataracts, bilateral    immature  . Chronic back pain   . DIVERTICULOSIS, COLON 10/28/2007  . Dry skin    red patch on right knee area  . GERD 10/28/2007   takes Prevacid daily  . H/O hiatal hernia   . HLD (hyperlipidemia) 12/12/2017  . Hx of colonic polyps   . HYPERLIPIDEMIA 10/28/2007   unable to take meds d/t allergies  . HYPERTENSION 10/28/2007   takes Diovan daily  . Internal hemorrhoids   . Irritable bowel syndrome (IBS)   . Joint pain   . Joint swelling   . LIVER FUNCTION TESTS, ABNORMAL, HX OF 10/29/2007  . Lumbar disc disease   .  Migraine   . Obesity   . OSTEOPENIA 10/29/2007  . Osteoporosis   . Presbyacusis 01/04/2010  . Rosacea   . Seasonal allergies    takes Clarinex daily  . Vertigo    hx of  . Vitamin D deficiency    takes Vit D daily    Medications:  Medications Prior to Admission  Medication Sig Dispense Refill Last Dose  . amLODipine (NORVASC) 2.5 MG tablet Take 1 tablet (2.5 mg total) by mouth daily. Follow-up appt due in May must see provider for future refills 90 tablet 0 10/03/2018 at Unknown time  . aspirin 81 MG tablet Take 81 mg by mouth daily.     10/03/2018 at Unknown time  . atorvastatin (LIPITOR) 10 MG tablet TAKE 1 TABLET BY MOUTH  EVERY OTHER DAY 45 tablet 0 10/02/2018 at Unknown time  . Calcium Citrate (CITRACAL PO) Take 2 tablets by mouth daily.    10/02/2018 at Unknown time  . Cholecalciferol (VITAMIN D) 2000 UNITS CAPS Take 2,000 Units by mouth daily.   10/02/2018 at Unknown time  .  fish oil-omega-3 fatty acids 1000 MG capsule Take 1 g by mouth daily.    10/02/2018 at Unknown time  . Multiple Vitamin (MULITIVITAMIN WITH MINERALS) TABS Take 1 tablet by mouth daily.   10/02/2018 at Unknown time  . valsartan-hydrochlorothiazide (DIOVAN-HCT) 160-12.5 MG tablet Take 1 tablet by mouth daily. Follow-up appt due in May must see provider for future refills 90 tablet 0 10/03/2018 at Unknown time  . vitamin E 400 UNIT capsule Take 400 Units by mouth daily.   10/02/2018 at Unknown time  . desloratadine (CLARINEX) 5 MG tablet Take 1 tablet (5 mg total) by mouth daily. (Patient taking differently: Take 5 mg by mouth as needed. ) 90 tablet 3   . esomeprazole (NEXIUM) 40 MG capsule Take 1 capsule (40 mg total) by mouth daily. 90 capsule 3   . ibuprofen (ADVIL,MOTRIN) 200 MG tablet Take 800 mg by mouth every 6 (six) hours as needed for moderate pain. Pain     . metroNIDAZOLE (METROGEL) 0.75 % gel Apply 1 application topically at bedtime. 45 g 0   . pimecrolimus (ELIDEL) 1 % cream Apply 1 application topically every morning.  30 g 0     Assessment: 63 yo F presenting with chest pain. Pharmacy consulted to start IV heparin for NSTEMI. Not on anticoagulation PTA.  Heparin level therapeutic at 0.38, CBC wnl. No overt bleeding noted.  Goal of Therapy:  Heparin level 0.3-0.7 units/ml Monitor platelets by anticoagulation protocol: Yes   Plan:  Continue heparin drip at 850 units/hr Daily heparin level, CBC Monitor for s/sx of bleeding F/u CTA vs cath   Vertis Kelch, PharmD PGY2 Cardiology Pharmacy Resident Phone [phone] 10/04/2018       8:27 AM  Please check [email or web address] for unit-specific pharmacist phone numbers

## 2018-10-05 LAB — CBC
HCT: 39.3 % (ref 36.0–46.0)
Hemoglobin: 13.3 g/dL (ref 12.0–15.0)
MCH: 30.3 pg (ref 26.0–34.0)
MCHC: 33.8 g/dL (ref 30.0–36.0)
MCV: 89.5 fL (ref 80.0–100.0)
Platelets: 241 10*3/uL (ref 150–400)
RBC: 4.39 MIL/uL (ref 3.87–5.11)
RDW: 12.2 % (ref 11.5–15.5)
WBC: 3.5 10*3/uL — ABNORMAL LOW (ref 4.0–10.5)
nRBC: 0 % (ref 0.0–0.2)

## 2018-10-05 LAB — HEPARIN LEVEL (UNFRACTIONATED): Heparin Unfractionated: 0.37 IU/mL (ref 0.30–0.70)

## 2018-10-05 MED ORDER — SODIUM CHLORIDE 0.9 % WEIGHT BASED INFUSION: 1.0000 mL/kg/h | INTRAVENOUS | Status: DC

## 2018-10-05 MED ORDER — ASPIRIN 81 MG PO CHEW
81.0000 mg | CHEWABLE_TABLET | ORAL | Status: AC
  Administered 2018-10-06: 81 mg via ORAL
  Filled 2018-10-05: qty 1

## 2018-10-05 MED ORDER — SODIUM CHLORIDE 0.9 % IV SOLN: 250.0000 mL | INTRAVENOUS | Status: DC | PRN

## 2018-10-05 MED ORDER — SODIUM CHLORIDE 0.9 % WEIGHT BASED INFUSION: 3.0000 mL/kg/h | INTRAVENOUS | Status: DC

## 2018-10-05 MED ORDER — SODIUM CHLORIDE 0.9% FLUSH: 3.0000 mL | INTRAVENOUS | Status: DC | PRN

## 2018-10-05 MED ORDER — SODIUM CHLORIDE 0.9% FLUSH
3.0000 mL | Freq: Two times a day (BID) | INTRAVENOUS | Status: DC
  Administered 2018-10-05 – 2018-10-06 (×2): 3 mL via INTRAVENOUS

## 2018-10-05 NOTE — Progress Notes (Signed)
Event Note: At 2240 Pt complained of a burning sensation across her chest-stated it may be her Hiatal Hernia. Pt given 2 TUMS per PRN order for indigestion/heartburn. Pt continued to complain of burning sensation and stated she felt "a heaviness on her chest" similar to symptoms that brought her to the ED but not as severe. BP 158/82 HR 65, MAP 106. At 2249  Pt given 1 SL NTG and O2 applied at 4L/min via nasal cannula. At 2251 BP 135/81 HR 82 MAP97 pt states the burning resolved but she states she still feels "a heaviness-not like anything sitting on my chest, it just feels heavy".  At 2254 BP 115/64 HR 79 MAP 80. I stated to patient with decrease in BP from 1 NTG, I wanted to wait a few more minutes prior to administering additional doses. Shortly there after pt states she was feeling dizzy and clammy. BP 71/49 HR 66 MAP 58. Head of bed lowered and pt stated she was going to vomit-pt rolled on to side, pt vomited approx 150 cc of yellowish liquid, Pt given approx 100cc NS bolus-symptoms resolved, BP 137/86 HR 72 MAP 103. Pt continues to rest comfortably, Vital signs remain stable. Pt is for Cardiac cath in am. Will continue to monitor. Jessie Foot, RN

## 2018-10-05 NOTE — Progress Notes (Signed)
Progress Note  Patient Name: Heather Merritt Date of Encounter: 10/05/2018  Primary Cardiologist: No primary care provider on file.   Subjective   No chest pain or sob.   Inpatient Medications    Scheduled Meds:  amLODipine  2.5 mg Oral Daily   aspirin  81 mg Oral Daily   atorvastatin  10 mg Oral q1800   irbesartan  150 mg Oral Daily   And   hydrochlorothiazide  12.5 mg Oral Daily   pantoprazole  40 mg Oral Daily   sodium chloride flush  3 mL Intravenous Q12H   Continuous Infusions:  heparin 850 Units/hr (10/04/18 1611)   PRN Meds: acetaminophen, calcium carbonate, nitroGLYCERIN, ondansetron (ZOFRAN) IV   Vital Signs    Vitals:   10/04/18 0608 10/04/18 1350 10/04/18 2137 10/05/18 0402  BP: 138/78 112/72 106/65 120/82  Pulse: 66 68 66 63  Resp: 16  19 18   Temp: 98.3 F (36.8 C) 98.2 F (36.8 C) 97.9 F (36.6 C) 97.7 F (36.5 C)  TempSrc: Oral Oral Oral Oral  SpO2: 98% 100% 95% 96%  Weight: 76.1 kg   75.7 kg  Height:        Intake/Output Summary (Last 24 hours) at 10/05/2018 1015 Last data filed at 10/05/2018 0600 Gross per 24 hour  Intake 884.31 ml  Output 400 ml  Net 484.31 ml   Filed Weights   10/03/18 1715 10/04/18 0608 10/05/18 0402  Weight: 76.6 kg 76.1 kg 75.7 kg    Telemetry    nsr - Personally Reviewed  ECG    none - Personally Reviewed  Physical Exam   GEN: No acute distress.   Neck: No JVD Cardiac: RRR Respiratory: no increased work of breathing GI: Soft, nontender, non-distended  MS: No edema; No deformity. Neuro:  Nonfocal  Psych: Normal affect   Labs    Chemistry Recent Labs  Lab 10/03/18 1114 10/04/18 0312  NA 138 138  K 3.6 3.7  CL 102 103  CO2 26 27  GLUCOSE 108* 104*  BUN 12 14  CREATININE 0.50 0.65  CALCIUM 9.3 9.1  PROT 7.2  --   ALBUMIN 4.3  --   AST 24  --   ALT 26  --   ALKPHOS 55  --   BILITOT 0.8  --   GFRNONAA >60 >60  GFRAA >60 >60  ANIONGAP 10 8     Hematology Recent Labs  Lab  10/03/18 1114 10/04/18 0312 10/05/18 0417  WBC 4.2 4.7 3.5*  RBC 4.59 4.34 4.39  HGB 13.9 13.1 13.3  HCT 41.7 39.1 39.3  MCV 90.8 90.1 89.5  MCH 30.3 30.2 30.3  MCHC 33.3 33.5 33.8  RDW 12.1 12.1 12.2  PLT 264 227 241    Cardiac EnzymesNo results for input(s): TROPONINI in the last 168 hours. No results for input(s): TROPIPOC in the last 168 hours.   BNPNo results for input(s): BNP, PROBNP in the last 168 hours.   DDimer No results for input(s): DDIMER in the last 168 hours.   Radiology    Dg Chest 2 View  Result Date: 10/03/2018 CLINICAL DATA:  Intermittent chest pain for 1 week EXAM: CHEST - 2 VIEW COMPARISON:  June 18, 2016 FINDINGS: The heart size and mediastinal contours are within normal limits. Minimal atelectasis or scar is identified in the left lung base. There is no focal infiltrate, pulmonary edema, or pleural effusion. The visualized skeletal structures are stable. IMPRESSION: No active cardiopulmonary disease. Electronically Signed  By: Abelardo Diesel M.D.   On: 10/03/2018 11:45   Ct Coronary Morph W/cta Cor W/score W/ca W/cm &/or Wo/cm  Addendum Date: 10/04/2018   ADDENDUM REPORT: 10/04/2018 14:56 CLINICAL DATA:  63 year old female with chest pain and borderline elevated troponin. EXAM: Cardiac/Coronary  CT TECHNIQUE: The patient was scanned on a [employer]. FINDINGS: A 120 kV prospective scan was triggered in the descending thoracic aorta at 111 HU's. Axial non-contrast 3 mm slices were carried out through the heart. The data set was analyzed on a dedicated work station and scored using the [place]. Gantry rotation speed was 250 msecs and collimation was .6 mm. No beta blockade and 0.8 mg of sl NTG was given. The 3D data set was reconstructed in 5% intervals of the 67-82 % of the R-R cycle. Diastolic phases were analyzed on a dedicated work station using MPR, MIP and VRT modes. The patient received 80 cc of contrast. Aorta:  Normal size.  No  calcifications.  No dissection. Aortic Valve:  Trileaflet.  No calcifications. Coronary Arteries:  Normal coronary origin.  Right dominance. RCA is a large dominant artery that gives rise to PDA and PLA. Proximal RCA has minimal plaque. Mid RCA has severe non-calcified plaque with subtotal occlusion > 90%. Distal RCA/PDA/PLA have only mild plaque. Left main is a large artery that gives rise to LAD, ramus intermedius and LCX arteries. Left main has no significant plaque. LAD is a large vessel that has mild mixed plaque in the ostial/proximal portion with stenosis 25-49%. Mid and distal LAD have only minimal plaque. Ramus intermedius is a medium size artery with minimal plaque. LCX is a small caliber non-dominant artery that gives rise to one small OM1 branch. There is minimal plaque. Other findings: Normal pulmonary vein drainage into the left atrium. Normal let atrial appendage without a thrombus. Normal size of the pulmonary artery. IMPRESSION: 1. Coronary calcium score of 123. This was 55 percentile for age and sex matched control. 2. Normal coronary origin with right dominance. 3. 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 A cardiac catheterization is recommended Electronically Signed   By: Ena Dawley   On: 10/04/2018 14:56   Result Date: 10/04/2018 EXAM: OVER-READ INTERPRETATION  CT CHEST The following report is an over-read performed by radiologist Dr. Abigail Miyamoto of [facility], [place] on 10/04/2018. This over-read does not include interpretation of cardiac or coronary anatomy or pathology. The coronary CTA interpretation by the cardiologist is attached. COMPARISON:  Chest radiograph 10/03/2018.  No prior CT. FINDINGS: Vascular: Normal aortic caliber. No central pulmonary embolism, on this non-dedicated study. Mediastinum/Nodes: No imaged thoracic adenopathy. Lungs/Pleura: Left lower lobe scarring. Upper Abdomen: Hepatic steatosis.  Proximal gastric underdistention. Incompletely imaged  hypoattenuation within the spleen, including anteriorly on image 45/11. No perisplenic fluid. Musculoskeletal: No acute osseous abnormality. IMPRESSION: 1. Incompletely imaged anterior splenic hypoenhancement, suspicious for infarct of indeterminate acuity. 2.  No acute findings in the imaged extracardiac chest. 3. Hepatic steatosis. Electronically Signed: By: Abigail Miyamoto M.D. On: 10/04/2018 11:28    Cardiac Studies   CT of the coronary arteries as above demonstrates subtotal occlusion of the mid RCA  Patient Profile     63 y.o. female admitted with NSTEMI, s/p CT angio with subtotal RCA occlusion  Assessment & Plan    1. NSTEMI - she appears to have RCA disesase and will undergo left heart cath to confirm the anatomy and PCI of RCA if coronary anatomy is accurately demonstrated  by RCA disease. 2. HTN - her pressures have been stable.  3. Dyslipidemia - she will need aggessive cholesterol lowering.  For questions or updates, please contact [place] Please consult [email or web address] for contact info under Cardiology/STEMI.      Signed, Cristopher Peru, MD  10/05/2018, 10:15 AM  Patient ID: [name], female   DOB: February 09, 1956, 63 y.o.   MRN: [id number]

## 2018-10-05 NOTE — Progress Notes (Signed)
ANTICOAGULATION CONSULT NOTE - Follow Up Consult  Pharmacy Consult for Heparin Indication: chest pain/ACS  Allergies  Allergen Reactions  . Penicillins Hives, Itching and Rash    Did it involve swelling of the face/tongue/throat, SOB, or low BP? Unknown Did it involve sudden or severe rash/hives, skin peeling, or any reaction on the inside of your mouth or nose? Unknown Did you need to seek medical attention at a hospital or doctor's office? Unknown When did it last happen?pt cant recall If all above answers are "NO", may proceed with cephalosporin use.   . Rosuvastatin Other (See Comments)    REACTION: muscle weakness  . Simvastatin Other (See Comments)    REACTION: muscle weakness  . Statins Other (See Comments)    Myalgias and weakness  . Sulfonamide Derivatives Other (See Comments)    Nausea, vomiting, abd pain  . Sulfa Antibiotics Other (See Comments)    Nausea, vomiting, abd pain    Patient Measurements: Height: 5\' 4"  (162.6 cm) Weight: 166 lb 14.2 oz (75.7 kg) IBW/kg (Calculated) : 54.7 Heparin Dosing Weight: 70.8 kg  Vital Signs: Temp: 97.7 F (36.5 C) (07/05 0402) Temp Source: Oral (07/05 0402) BP: 120/82 (07/05 0402) Pulse Rate: 63 (07/05 0402)  Labs: Recent Labs    10/03/18 1114 10/03/18 1258 10/03/18 2023 10/04/18 0312 10/04/18 0819 10/05/18 0417  HGB 13.9  --   --  13.1  --  13.3  HCT 41.7  --   --  39.1  --  39.3  PLT 264  --   --  227  --  241  APTT 33  --   --   --   --   --   LABPROT 12.4  --   --   --   --   --   INR 0.9  --   --   --   --   --   HEPARINUNFRC  --   --  0.42 0.38  --  0.37  CREATININE 0.50  --   --  0.65  --   --   TROPONINIHS 86* 104*  --   --  130*  --     Estimated Creatinine Clearance: 69.8 mL/min (by C-G formula based on SCr of 0.65 mg/dL).  Assessment:   Anticoag: Heparin for ACS/NSTEMI, troponin 86>104>130, HL 0.37 in goal. CBC WNL and stable.  Goal of Therapy:  Heparin level 0.3-0.7 units/ml Monitor  platelets by anticoagulation protocol: Yes   Plan:  Cont Heparin gtt at 850 units/hr Daily heparin level and CBC Cath  Janny Crute S. Alford Highland, PharmD, BCPS Clinical Staff Pharmacist Eilene Ghazi [place] 10/05/2018,11:26 AM

## 2018-10-06 ENCOUNTER — Inpatient Hospital Stay (HOSPITAL_COMMUNITY)
Admission: EM | Disposition: A | Discharge status: Home / Self Care | Payer: Self-pay | Source: Home / Self Care | Attending: Cardiology

## 2018-10-06 ENCOUNTER — Inpatient Hospital Stay (HOSPITAL_COMMUNITY): Payer: Medicare Other

## 2018-10-06 DIAGNOSIS — I2510 Atherosclerotic heart disease of native coronary artery without angina pectoris: Secondary | ICD-10-CM

## 2018-10-06 HISTORY — PX: LEFT HEART CATH AND CORONARY ANGIOGRAPHY: CATH118249

## 2018-10-06 HISTORY — PX: CORONARY STENT INTERVENTION: CATH118234

## 2018-10-06 LAB — CBC
HCT: 37.8 % (ref 36.0–46.0)
Hemoglobin: 12.8 g/dL (ref 12.0–15.0)
MCH: 30.4 pg (ref 26.0–34.0)
MCHC: 33.9 g/dL (ref 30.0–36.0)
MCV: 89.8 fL (ref 80.0–100.0)
Platelets: 227 10*3/uL (ref 150–400)
RBC: 4.21 MIL/uL (ref 3.87–5.11)
RDW: 12.3 % (ref 11.5–15.5)
WBC: 3.9 10*3/uL — ABNORMAL LOW (ref 4.0–10.5)
nRBC: 0 % (ref 0.0–0.2)

## 2018-10-06 LAB — ECHOCARDIOGRAM COMPLETE
Height: 64 in
Weight: 2691.2 oz

## 2018-10-06 LAB — BASIC METABOLIC PANEL
Anion gap: 8 (ref 5–15)
BUN: 15 mg/dL (ref 8–23)
CO2: 26 mmol/L (ref 22–32)
Calcium: 9.1 mg/dL (ref 8.9–10.3)
Chloride: 105 mmol/L (ref 98–111)
Creatinine, Ser: 0.63 mg/dL (ref 0.44–1.00)
GFR calc Af Amer: >60 mL/min (ref >60)
GFR calc non Af Amer: >60 mL/min (ref >60)
Glucose, Bld: 115 mg/dL — ABNORMAL HIGH (ref 70–99)
Potassium: 3.9 mmol/L (ref 3.5–5.1)
Sodium: 139 mmol/L (ref 135–145)

## 2018-10-06 LAB — POCT ACTIVATED CLOTTING TIME
Activated Clotting Time: 268 seconds
Activated Clotting Time: 279 seconds

## 2018-10-06 LAB — HIV ANTIBODY (ROUTINE TESTING W REFLEX): HIV Screen 4th Generation wRfx: NONREACTIVE

## 2018-10-06 LAB — HEPARIN LEVEL (UNFRACTIONATED): Heparin Unfractionated: 0.37 IU/mL (ref 0.30–0.70)

## 2018-10-06 SURGERY — LEFT HEART CATH AND CORONARY ANGIOGRAPHY
Anesthesia: LOCAL

## 2018-10-06 MED ORDER — SODIUM CHLORIDE 0.9 % IV BOLUS
250.0000 mL | Freq: Once | INTRAVENOUS | Status: AC
  Administered 2018-10-06: 250 mL via INTRAVENOUS

## 2018-10-06 MED ORDER — SODIUM CHLORIDE 0.9 % IV SOLN: 250.0000 mL | INTRAVENOUS | Status: DC | PRN

## 2018-10-06 MED ORDER — HEPARIN (PORCINE) IN NACL 1000-0.9 UT/500ML-% IV SOLN
INTRAVENOUS | Status: DC | PRN
  Administered 2018-10-06 (×2): 500 mL

## 2018-10-06 MED ORDER — SODIUM CHLORIDE 0.9 % IV SOLN: INTRAVENOUS | Status: AC

## 2018-10-06 MED ORDER — HEPARIN (PORCINE) IN NACL 1000-0.9 UT/500ML-% IV SOLN
INTRAVENOUS | Status: AC
  Filled 2018-10-06: qty 1000

## 2018-10-06 MED ORDER — TICAGRELOR 90 MG PO TABS
90.0000 mg | ORAL_TABLET | Freq: Two times a day (BID) | ORAL | Status: DC
  Administered 2018-10-06 – 2018-10-07 (×2): 90 mg via ORAL
  Filled 2018-10-06 (×3): qty 1

## 2018-10-06 MED ORDER — ALUM & MAG HYDROXIDE-SIMETH 200-200-20 MG/5ML PO SUSP
30.0000 mL | Freq: Once | ORAL | Status: AC
  Administered 2018-10-06: 30 mL via ORAL
  Filled 2018-10-06: qty 30

## 2018-10-06 MED ORDER — MIDAZOLAM HCL 2 MG/2ML IJ SOLN
INTRAMUSCULAR | Status: AC
  Filled 2018-10-06: qty 2

## 2018-10-06 MED ORDER — FENTANYL CITRATE (PF) 100 MCG/2ML IJ SOLN
INTRAMUSCULAR | Status: AC
  Filled 2018-10-06: qty 2

## 2018-10-06 MED ORDER — SODIUM CHLORIDE 0.9% FLUSH: 3.0000 mL | INTRAVENOUS | Status: DC | PRN

## 2018-10-06 MED ORDER — ASPIRIN 81 MG PO CHEW
81.0000 mg | CHEWABLE_TABLET | Freq: Every day | ORAL | Status: DC
  Administered 2018-10-07: 81 mg via ORAL
  Filled 2018-10-06: qty 1

## 2018-10-06 MED ORDER — LIDOCAINE VISCOUS HCL 2 % MT SOLN
15.0000 mL | Freq: Once | OROMUCOSAL | Status: AC
  Administered 2018-10-06: 15 mL via ORAL
  Filled 2018-10-06: qty 15

## 2018-10-06 MED ORDER — IOHEXOL 350 MG/ML SOLN
INTRAVENOUS | Status: DC | PRN
  Administered 2018-10-06: 110 mL via INTRAVENOUS

## 2018-10-06 MED ORDER — TICAGRELOR 90 MG PO TABS
ORAL_TABLET | ORAL | Status: AC
  Filled 2018-10-06: qty 3

## 2018-10-06 MED ORDER — FENTANYL CITRATE (PF) 100 MCG/2ML IJ SOLN
INTRAMUSCULAR | Status: DC | PRN
  Administered 2018-10-06: 25 ug via INTRAVENOUS

## 2018-10-06 MED ORDER — MORPHINE SULFATE (PF) 2 MG/ML IV SOLN
2.0000 mg | Freq: Once | INTRAVENOUS | Status: AC
  Administered 2018-10-06: 2 mg via INTRAVENOUS

## 2018-10-06 MED ORDER — ONDANSETRON HCL 4 MG/2ML IJ SOLN: 4.0000 mg | Freq: Four times a day (QID) | INTRAMUSCULAR | Status: DC | PRN

## 2018-10-06 MED ORDER — HEPARIN SODIUM (PORCINE) 1000 UNIT/ML IJ SOLN
INTRAMUSCULAR | Status: DC | PRN
  Administered 2018-10-06: 2500 [IU] via INTRAVENOUS
  Administered 2018-10-06: 3500 [IU] via INTRAVENOUS
  Administered 2018-10-06: 4500 [IU] via INTRAVENOUS

## 2018-10-06 MED ORDER — ALUM & MAG HYDROXIDE-SIMETH 200-200-20 MG/5ML PO SUSP
30.0000 mL | ORAL | Status: DC | PRN
  Administered 2018-10-06 (×3): 30 mL via ORAL
  Filled 2018-10-06 (×3): qty 30

## 2018-10-06 MED ORDER — LABETALOL HCL 5 MG/ML IV SOLN: 10.0000 mg | INTRAVENOUS | Status: AC | PRN

## 2018-10-06 MED ORDER — MORPHINE SULFATE (PF) 2 MG/ML IV SOLN
INTRAVENOUS | Status: AC
  Filled 2018-10-06: qty 1

## 2018-10-06 MED ORDER — HEPARIN (PORCINE) IN NACL 1000-0.9 UT/500ML-% IV SOLN
INTRAVENOUS | Status: AC
  Filled 2018-10-06: qty 500

## 2018-10-06 MED ORDER — SODIUM CHLORIDE 0.9% FLUSH
3.0000 mL | Freq: Two times a day (BID) | INTRAVENOUS | Status: DC
  Administered 2018-10-07: 3 mL via INTRAVENOUS

## 2018-10-06 MED ORDER — MIDAZOLAM HCL 2 MG/2ML IJ SOLN
INTRAMUSCULAR | Status: DC | PRN
  Administered 2018-10-06: 1 mg via INTRAVENOUS

## 2018-10-06 MED ORDER — HEPARIN SODIUM (PORCINE) 1000 UNIT/ML IJ SOLN
INTRAMUSCULAR | Status: AC
  Filled 2018-10-06: qty 1

## 2018-10-06 MED ORDER — VERAPAMIL HCL 2.5 MG/ML IV SOLN
INTRAVENOUS | Status: AC
  Filled 2018-10-06: qty 2

## 2018-10-06 MED ORDER — HYDRALAZINE HCL 20 MG/ML IJ SOLN: 10.0000 mg | INTRAMUSCULAR | Status: AC | PRN

## 2018-10-06 MED ORDER — VERAPAMIL HCL 2.5 MG/ML IV SOLN
INTRA_ARTERIAL | Status: DC | PRN
  Administered 2018-10-06: 5 mL via INTRA_ARTERIAL

## 2018-10-06 MED ORDER — TICAGRELOR 90 MG PO TABS
ORAL_TABLET | ORAL | Status: DC | PRN
  Administered 2018-10-06: 180 mg via ORAL

## 2018-10-06 MED ORDER — SODIUM CHLORIDE 0.9 % IV SOLN
INTRAVENOUS | Status: AC | PRN
  Administered 2018-10-06: 250 mL via INTRAVENOUS

## 2018-10-06 MED ORDER — LIDOCAINE HCL (PF) 1 % IJ SOLN
INTRAMUSCULAR | Status: DC | PRN
  Administered 2018-10-06: 2 mL via INTRADERMAL

## 2018-10-06 MED ORDER — PERFLUTREN LIPID MICROSPHERE
INTRAVENOUS | Status: AC
  Filled 2018-10-06: qty 10

## 2018-10-06 MED ORDER — LIDOCAINE HCL (PF) 1 % IJ SOLN
INTRAMUSCULAR | Status: AC
  Filled 2018-10-06: qty 30

## 2018-10-06 MED ORDER — PERFLUTREN LIPID MICROSPHERE
1.0000 mL | INTRAVENOUS | Status: AC | PRN
  Administered 2018-10-06: 3 mL via INTRAVENOUS
  Filled 2018-10-06: qty 10

## 2018-10-06 MED ORDER — ACETAMINOPHEN 325 MG PO TABS: 650.0000 mg | ORAL_TABLET | ORAL | Status: DC | PRN

## 2018-10-06 MED ORDER — NITROGLYCERIN 1 MG/10 ML FOR IR/CATH LAB
INTRA_ARTERIAL | Status: AC
  Filled 2018-10-06: qty 10

## 2018-10-06 MED ORDER — MORPHINE SULFATE (PF) 2 MG/ML IV SOLN: 2.0000 mg | INTRAVENOUS | Status: DC | PRN

## 2018-10-06 SURGICAL SUPPLY — 20 items
BALLN SAPPHIRE 2.0X12 (BALLOONS) ×4
BALLN SAPPHIRE ~~LOC~~ 2.75X12 (BALLOONS) ×1 IMPLANT
BALLOON SAPPHIRE 2.0X12 (BALLOONS) IMPLANT
CATH INFINITI 5FR ANG PIGTAIL (CATHETERS) ×1 IMPLANT
CATH OPTITORQUE TIG 4.0 5F (CATHETERS) ×1 IMPLANT
CATH VISTA GUIDE 6FR JR4 (CATHETERS) ×1 IMPLANT
COVER DOME SNAP 22 D (MISCELLANEOUS) ×2 IMPLANT
DEVICE RAD COMP TR BAND LRG (VASCULAR PRODUCTS) ×1 IMPLANT
GLIDESHEATH SLEND A-KIT 6F 22G (SHEATH) ×1 IMPLANT
GUIDEWIRE INQWIRE 1.5J.035X260 (WIRE) IMPLANT
INQWIRE 1.5J .035X260CM (WIRE) ×2
KIT ENCORE 26 ADVANTAGE (KITS) ×1 IMPLANT
KIT HEART LEFT (KITS) ×2 IMPLANT
PACK CARDIAC CATHETERIZATION (CUSTOM PROCEDURE TRAY) ×2 IMPLANT
STENT SYNERGY DES 2.5X20 (Permanent Stent) ×1 IMPLANT
SYR MEDRAD MARK 7 150ML (SYRINGE) ×2 IMPLANT
TRANSDUCER W/STOPCOCK (MISCELLANEOUS) ×2 IMPLANT
TUBING CIL FLEX 10 FLL-RA (TUBING) ×2 IMPLANT
WIRE ASAHI PROWATER 180CM (WIRE) ×1 IMPLANT
WIRE HI TORQ VERSACORE-J 145CM (WIRE) ×1 IMPLANT

## 2018-10-06 NOTE — H&P (View-Only) (Signed)
Progress Note  Patient Name: Heather Merritt Date of Encounter: 10/06/2018  Primary Cardiologist: New to Dr. Gwenlyn Found   Subjective   Chest pain episode overnight. Hypotension/vomiting after SL nitro. Currently pain free. Had lots of questions regarding cath>> all answered. Will sign consent.   Inpatient Medications    Scheduled Meds: . amLODipine  2.5 mg Oral Daily  . aspirin  81 mg Oral Daily  . atorvastatin  10 mg Oral q1800  . irbesartan  150 mg Oral Daily   And  . hydrochlorothiazide  12.5 mg Oral Daily  . pantoprazole  40 mg Oral Daily  . sodium chloride flush  3 mL Intravenous Q12H   Continuous Infusions: . sodium chloride    . sodium chloride 1 mL/kg/hr (10/06/18 0629)  . heparin 850 Units/hr (10/05/18 2111)   PRN Meds: sodium chloride, acetaminophen, alum & mag hydroxide-simeth, nitroGLYCERIN, ondansetron (ZOFRAN) IV, sodium chloride flush   Vital Signs    Vitals:   10/05/18 2303 10/05/18 2315 10/05/18 2349 10/06/18 0359  BP: 137/86 131/77 129/84 111/74  Pulse: 72 61 60 (!) 59  Resp:      Temp:    97.6 F (36.4 C)  TempSrc:    Oral  SpO2: 98% 100% 99% 100%  Weight:    76.3 kg  Height:        Intake/Output Summary (Last 24 hours) at 10/06/2018 0843 Last data filed at 10/05/2018 2100 Gross per 24 hour  Intake 420 ml  Output 1100 ml  Net -680 ml   Last 3 Weights 10/06/2018 10/05/2018 10/04/2018  Weight (lbs) 168 lb 3.2 oz 166 lb 14.2 oz 167 lb 12.8 oz  Weight (kg) 76.295 kg 75.7 kg 76.114 kg      Telemetry    SR at rate of 60s - Personally Reviewed  ECG    SR with TWI in leads III and aVF - Personally Reviewed  Physical Exam   GEN: No acute distress.   Neck: No JVD Cardiac: RRR, no murmurs, rubs, or gallops.  Respiratory: Clear to auscultation bilaterally. GI: Soft, nontender, non-distended  MS: No edema; No deformity. Neuro:  Nonfocal  Psych: Normal affect   Labs    High Sensitivity Troponin:   Recent Labs  Lab 10/03/18 1114 10/03/18 1258  10/04/18 0819  TROPONINIHS 86* 104* 130*      Cardiac EnzymesNo results for input(s): TROPONINI in the last 168 hours. No results for input(s): TROPIPOC in the last 168 hours.   Chemistry Recent Labs  Lab 10/03/18 1114 10/04/18 0312 10/06/18 0548  NA 138 138 139  K 3.6 3.7 3.9  CL 102 103 105  CO2 26 27 26   GLUCOSE 108* 104* 115*  BUN 12 14 15   CREATININE 0.50 0.65 0.63  CALCIUM 9.3 9.1 9.1  PROT 7.2  --   --   ALBUMIN 4.3  --   --   AST 24  --   --   ALT 26  --   --   ALKPHOS 55  --   --   BILITOT 0.8  --   --   GFRNONAA >60 >60 >60  GFRAA >60 >60 >60  ANIONGAP 10 8 8      Hematology Recent Labs  Lab 10/04/18 0312 10/05/18 0417 10/06/18 0548  WBC 4.7 3.5* 3.9*  RBC 4.34 4.39 4.21  HGB 13.1 13.3 12.8  HCT 39.1 39.3 37.8  MCV 90.1 89.5 89.8  MCH 30.2 30.3 30.4  MCHC 33.5 33.8 33.9  RDW 12.1 12.2  12.3  PLT 227 241 227    Radiology    Ct Coronary Morph W/cta Cor W/score W/ca W/cm &/or Wo/cm  Addendum Date: 10/04/2018   ADDENDUM REPORT: 10/04/2018 14:56 CLINICAL DATA:  63 year old female with chest pain and borderline elevated troponin. EXAM: Cardiac/Coronary  CT TECHNIQUE: The patient was scanned on a [employer]. FINDINGS: A 120 kV prospective scan was triggered in the descending thoracic aorta at 111 HU's. Axial non-contrast 3 mm slices were carried out through the heart. The data set was analyzed on a dedicated work station and scored using the [employer]. Gantry rotation speed was 250 msecs and collimation was .6 mm. No beta blockade and 0.8 mg of sl NTG was given. The 3D data set was reconstructed in 5% intervals of the 67-82 % of the R-R cycle. Diastolic phases were analyzed on a dedicated work station using MPR, MIP and VRT modes. The patient received 80 cc of contrast. Aorta:  Normal size.  No calcifications.  No dissection. Aortic Valve:  Trileaflet.  No calcifications. Coronary Arteries:  Normal coronary origin.  Right dominance. RCA is a large  dominant artery that gives rise to PDA and PLA. Proximal RCA has minimal plaque. Mid RCA has severe non-calcified plaque with subtotal occlusion > 90%. Distal RCA/PDA/PLA have only mild plaque. Left main is a large artery that gives rise to LAD, ramus intermedius and LCX arteries. Left main has no significant plaque. LAD is a large vessel that has mild mixed plaque in the ostial/proximal portion with stenosis 25-49%. Mid and distal LAD have only minimal plaque. Ramus intermedius is a medium size artery with minimal plaque. LCX is a small caliber non-dominant artery that gives rise to one small OM1 branch. There is minimal plaque. Other findings: Normal pulmonary vein drainage into the left atrium. Normal let atrial appendage without a thrombus. Normal size of the pulmonary artery. IMPRESSION: 1. Coronary calcium score of 123. This was 11 percentile for age and sex matched control. 2. Normal coronary origin with right dominance. 3. 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 A cardiac catheterization is recommended Electronically Signed   By: Ena Dawley   On: 10/04/2018 14:56   Result Date: 10/04/2018 EXAM: OVER-READ INTERPRETATION  CT CHEST The following report is an over-read performed by radiologist Dr. Abigail Miyamoto of [facility], [place] on 10/04/2018. This over-read does not include interpretation of cardiac or coronary anatomy or pathology. The coronary CTA interpretation by the cardiologist is attached. COMPARISON:  Chest radiograph 10/03/2018.  No prior CT. FINDINGS: Vascular: Normal aortic caliber. No central pulmonary embolism, on this non-dedicated study. Mediastinum/Nodes: No imaged thoracic adenopathy. Lungs/Pleura: Left lower lobe scarring. Upper Abdomen: Hepatic steatosis.  Proximal gastric underdistention. Incompletely imaged hypoattenuation within the spleen, including anteriorly on image 45/11. No perisplenic fluid. Musculoskeletal: No acute osseous abnormality. IMPRESSION: 1.  Incompletely imaged anterior splenic hypoenhancement, suspicious for infarct of indeterminate acuity. 2.  No acute findings in the imaged extracardiac chest. 3. Hepatic steatosis. Electronically Signed: By: Abigail Miyamoto M.D. On: 10/04/2018 11:28   Ct Coronary Fractional Flow Reserve Data Prep  Result Date: 10/05/2018 EXAM: CT FFR ANALYSIS CLINICAL DATA:  63 year old female with chest pain. FINDINGS: FFRct analysis was performed on the original cardiac CT angiogram dataset. Diagrammatic representation of the FFRct analysis is provided in a separate PDF document in PACS. This dictation was created using the PDF document and an interactive 3D model of the results. 3D model is not available in the EMR/PACS.  Normal FFR range is >0.80. 1. Left Main: 0.98. 2. LAD: 0.94. 3. LCX: 0.94. 4. RCA: Proximal: 0.95, distal: 0.59. IMPRESSION: 1. CT FFR analysis showed severe stenosis in the mid RCA. A cardiac catheterization is recommended. Electronically Signed   By: Ena Dawley   On: 10/05/2018 13:40    Cardiac Studies    Coronary CT IMPRESSION: 1. Coronary calcium score of 123. This was 65 percentile for age and sex matched control.  2. Normal coronary origin with right dominance.  3. 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 A cardiac catheterization is recommended  CT FFR ANALYSIS  1. Left Main: 0.98. 2. LAD: 0.94. 3. LCX: 0.94. 4. RCA: Proximal: 0.95, distal: 0.59.  IMPRESSION: 1. CT FFR analysis showed severe stenosis in the mid RCA. A cardiac catheterization is recommended.  Patient Profile     Ms. Kymorah Korf is a 63 year old female with a history of HLD, HTN, history of hiatal hernia, anxiety, and GERD who is being admitted for chest pain  Assessment & Plan    1. NSTEMI - Hs troponin 86->104->130. EKG with non specific TWI in inferior lead. Chest pain episode overnight which resolved. She does have hx of hiatal hernia and some component of GERD.  - CT FFR with  mid/distal RCA disease. - Plan coronary angiography today. - The patient understands that risks include but are not limited to stroke (1 in 1000), death (1 in 102), kidney failure [usually temporary] (1 in 500), bleeding (1 in 200), allergic reaction [possibly serious] (1 in 200), and agrees to proceed.    2. HLD - 10/04/2018: Cholesterol 204; HDL 36; LDL Cholesterol 112; Triglycerides 280; VLDL 56  - Statin intolerance. On Lipitor 10mg  daily.  - Lipid clinic evaluation for PCSK9 inhibitor as outpatient.   3. HTN -   BP stable on current medications  For questions or updates, please contact [place] Please consult [email or web address] for contact info under        Signed, Leanor Kail, PA  10/06/2018, 8:43 AM    Agree with note by Robbie Lis PA-C  Ms. Thoreson was admitted on 10/03/2018 with unstable angina.  Her enzymes are mildly positive.  She has inferior T wave inversion and a CT a FFR showing significant mid RCA disease.  She did have chest pain last night and was treated with morphine as well as something like nitroglycerin which resulted in hypotension responding to fluid resuscitation.  She is on IV heparin and is pain-free this morning.  Her exam is benign.  Plan is for diagnostic coronary angiography today.  I have reviewed the risks, indications, and alternatives to cardiac catheterization, possible angioplasty, and stenting with the patient. Risks include but are not limited to bleeding, infection, vascular injury, stroke, myocardial infection, arrhythmia, kidney injury, radiation-related injury in the case of prolonged fluoroscopy use, emergency cardiac surgery, and death. The patient understands the risks of serious complication is 1-2 in 8144 with diagnostic cardiac cath and 1-2% or less with angioplasty/stenting.   Lorretta Harp, M.D., [employer], [facility], [place] [address] [place], [place]  [address]  [phone]  10/06/2018 9:46 AM

## 2018-10-06 NOTE — Progress Notes (Addendum)
Progress Note  Patient Name: Heather Merritt Date of Encounter: 10/06/2018  Primary Cardiologist: New to Dr. Gwenlyn Found   Subjective   Chest pain episode overnight. Hypotension/vomiting after SL nitro. Currently pain free. Had lots of questions regarding cath>> all answered. Will sign consent.   Inpatient Medications    Scheduled Meds: . amLODipine  2.5 mg Oral Daily  . aspirin  81 mg Oral Daily  . atorvastatin  10 mg Oral q1800  . irbesartan  150 mg Oral Daily   And  . hydrochlorothiazide  12.5 mg Oral Daily  . pantoprazole  40 mg Oral Daily  . sodium chloride flush  3 mL Intravenous Q12H   Continuous Infusions: . sodium chloride    . sodium chloride 1 mL/kg/hr (10/06/18 0629)  . heparin 850 Units/hr (10/05/18 2111)   PRN Meds: sodium chloride, acetaminophen, alum & mag hydroxide-simeth, nitroGLYCERIN, ondansetron (ZOFRAN) IV, sodium chloride flush   Vital Signs    Vitals:   10/05/18 2303 10/05/18 2315 10/05/18 2349 10/06/18 0359  BP: 137/86 131/77 129/84 111/74  Pulse: 72 61 60 (!) 59  Resp:      Temp:    97.6 F (36.4 C)  TempSrc:    Oral  SpO2: 98% 100% 99% 100%  Weight:    76.3 kg  Height:        Intake/Output Summary (Last 24 hours) at 10/06/2018 0843 Last data filed at 10/05/2018 2100 Gross per 24 hour  Intake 420 ml  Output 1100 ml  Net -680 ml   Last 3 Weights 10/06/2018 10/05/2018 10/04/2018  Weight (lbs) 168 lb 3.2 oz 166 lb 14.2 oz 167 lb 12.8 oz  Weight (kg) 76.295 kg 75.7 kg 76.114 kg      Telemetry    SR at rate of 60s - Personally Reviewed  ECG    SR with TWI in leads III and aVF - Personally Reviewed  Physical Exam   GEN: No acute distress.   Neck: No JVD Cardiac: RRR, no murmurs, rubs, or gallops.  Respiratory: Clear to auscultation bilaterally. GI: Soft, nontender, non-distended  MS: No edema; No deformity. Neuro:  Nonfocal  Psych: Normal affect   Labs    High Sensitivity Troponin:   Recent Labs  Lab 10/03/18 1114 10/03/18 1258  10/04/18 0819  TROPONINIHS 86* 104* 130*      Cardiac EnzymesNo results for input(s): TROPONINI in the last 168 hours. No results for input(s): TROPIPOC in the last 168 hours.   Chemistry Recent Labs  Lab 10/03/18 1114 10/04/18 0312 10/06/18 0548  NA 138 138 139  K 3.6 3.7 3.9  CL 102 103 105  CO2 26 27 26   GLUCOSE 108* 104* 115*  BUN 12 14 15   CREATININE 0.50 0.65 0.63  CALCIUM 9.3 9.1 9.1  PROT 7.2  --   --   ALBUMIN 4.3  --   --   AST 24  --   --   ALT 26  --   --   ALKPHOS 55  --   --   BILITOT 0.8  --   --   GFRNONAA >60 >60 >60  GFRAA >60 >60 >60  ANIONGAP 10 8 8      Hematology Recent Labs  Lab 10/04/18 0312 10/05/18 0417 10/06/18 0548  WBC 4.7 3.5* 3.9*  RBC 4.34 4.39 4.21  HGB 13.1 13.3 12.8  HCT 39.1 39.3 37.8  MCV 90.1 89.5 89.8  MCH 30.2 30.3 30.4  MCHC 33.5 33.8 33.9  RDW 12.1 12.2  12.3  PLT 227 241 227    Radiology    Ct Coronary Morph W/cta Cor W/score W/ca W/cm &/or Wo/cm  Addendum Date: 10/04/2018   ADDENDUM REPORT: 10/04/2018 14:56 CLINICAL DATA:  63 year old female with chest pain and borderline elevated troponin. EXAM: Cardiac/Coronary  CT TECHNIQUE: The patient was scanned on a [employer]. FINDINGS: A 120 kV prospective scan was triggered in the descending thoracic aorta at 111 HU's. Axial non-contrast 3 mm slices were carried out through the heart. The data set was analyzed on a dedicated work station and scored using the [employer]. Gantry rotation speed was 250 msecs and collimation was .6 mm. No beta blockade and 0.8 mg of sl NTG was given. The 3D data set was reconstructed in 5% intervals of the 67-82 % of the R-R cycle. Diastolic phases were analyzed on a dedicated work station using MPR, MIP and VRT modes. The patient received 80 cc of contrast. Aorta:  Normal size.  No calcifications.  No dissection. Aortic Valve:  Trileaflet.  No calcifications. Coronary Arteries:  Normal coronary origin.  Right dominance. RCA is a large  dominant artery that gives rise to PDA and PLA. Proximal RCA has minimal plaque. Mid RCA has severe non-calcified plaque with subtotal occlusion > 90%. Distal RCA/PDA/PLA have only mild plaque. Left main is a large artery that gives rise to LAD, ramus intermedius and LCX arteries. Left main has no significant plaque. LAD is a large vessel that has mild mixed plaque in the ostial/proximal portion with stenosis 25-49%. Mid and distal LAD have only minimal plaque. Ramus intermedius is a medium size artery with minimal plaque. LCX is a small caliber non-dominant artery that gives rise to one small OM1 branch. There is minimal plaque. Other findings: Normal pulmonary vein drainage into the left atrium. Normal let atrial appendage without a thrombus. Normal size of the pulmonary artery. IMPRESSION: 1. Coronary calcium score of 123. This was 36 percentile for age and sex matched control. 2. Normal coronary origin with right dominance. 3. 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 A cardiac catheterization is recommended Electronically Signed   By: Ena Dawley   On: 10/04/2018 14:56   Result Date: 10/04/2018 EXAM: OVER-READ INTERPRETATION  CT CHEST The following report is an over-read performed by radiologist Dr. Abigail Miyamoto of [facility], [place] on 10/04/2018. This over-read does not include interpretation of cardiac or coronary anatomy or pathology. The coronary CTA interpretation by the cardiologist is attached. COMPARISON:  Chest radiograph 10/03/2018.  No prior CT. FINDINGS: Vascular: Normal aortic caliber. No central pulmonary embolism, on this non-dedicated study. Mediastinum/Nodes: No imaged thoracic adenopathy. Lungs/Pleura: Left lower lobe scarring. Upper Abdomen: Hepatic steatosis.  Proximal gastric underdistention. Incompletely imaged hypoattenuation within the spleen, including anteriorly on image 45/11. No perisplenic fluid. Musculoskeletal: No acute osseous abnormality. IMPRESSION: 1.  Incompletely imaged anterior splenic hypoenhancement, suspicious for infarct of indeterminate acuity. 2.  No acute findings in the imaged extracardiac chest. 3. Hepatic steatosis. Electronically Signed: By: Abigail Miyamoto M.D. On: 10/04/2018 11:28   Ct Coronary Fractional Flow Reserve Data Prep  Result Date: 10/05/2018 EXAM: CT FFR ANALYSIS CLINICAL DATA:  63 year old female with chest pain. FINDINGS: FFRct analysis was performed on the original cardiac CT angiogram dataset. Diagrammatic representation of the FFRct analysis is provided in a separate PDF document in PACS. This dictation was created using the PDF document and an interactive 3D model of the results. 3D model is not available in the EMR/PACS.  Normal FFR range is >0.80. 1. Left Main: 0.98. 2. LAD: 0.94. 3. LCX: 0.94. 4. RCA: Proximal: 0.95, distal: 0.59. IMPRESSION: 1. CT FFR analysis showed severe stenosis in the mid RCA. A cardiac catheterization is recommended. Electronically Signed   By: Ena Dawley   On: 10/05/2018 13:40    Cardiac Studies    Coronary CT IMPRESSION: 1. Coronary calcium score of 123. This was 40 percentile for age and sex matched control.  2. Normal coronary origin with right dominance.  3. 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 A cardiac catheterization is recommended  CT FFR ANALYSIS  1. Left Main: 0.98. 2. LAD: 0.94. 3. LCX: 0.94. 4. RCA: Proximal: 0.95, distal: 0.59.  IMPRESSION: 1. CT FFR analysis showed severe stenosis in the mid RCA. A cardiac catheterization is recommended.  Patient Profile     Ms. Heather Merritt is a 63 year old female with a history of HLD, HTN, history of hiatal hernia, anxiety, and GERD who is being admitted for chest pain  Assessment & Plan    1. NSTEMI - Hs troponin 86->104->130. EKG with non specific TWI in inferior lead. Chest pain episode overnight which resolved. She does have hx of hiatal hernia and some component of GERD.  - CT FFR with  mid/distal RCA disease. - Plan coronary angiography today. - The patient understands that risks include but are not limited to stroke (1 in 1000), death (1 in 4), kidney failure [usually temporary] (1 in 500), bleeding (1 in 200), allergic reaction [possibly serious] (1 in 200), and agrees to proceed.    2. HLD - 10/04/2018: Cholesterol 204; HDL 36; LDL Cholesterol 112; Triglycerides 280; VLDL 56  - Statin intolerance. On Lipitor 10mg  daily.  - Lipid clinic evaluation for PCSK9 inhibitor as outpatient.   3. HTN -   BP stable on current medications  For questions or updates, please contact [place] Please consult [email or web address] for contact info under        Signed, Leanor Kail, PA  10/06/2018, 8:43 AM    Agree with note by Robbie Lis PA-C  Ms. Heather Merritt was admitted on 10/03/2018 with unstable angina.  Her enzymes are mildly positive.  She has inferior T wave inversion and a CT a FFR showing significant mid RCA disease.  She did have chest pain last night and was treated with morphine as well as something like nitroglycerin which resulted in hypotension responding to fluid resuscitation.  She is on IV heparin and is pain-free this morning.  Her exam is benign.  Plan is for diagnostic coronary angiography today.  I have reviewed the risks, indications, and alternatives to cardiac catheterization, possible angioplasty, and stenting with the patient. Risks include but are not limited to bleeding, infection, vascular injury, stroke, myocardial infection, arrhythmia, kidney injury, radiation-related injury in the case of prolonged fluoroscopy use, emergency cardiac surgery, and death. The patient understands the risks of serious complication is 1-2 in 6314 with diagnostic cardiac cath and 1-2% or less with angioplasty/stenting.   Lorretta Harp, M.D., [place], [facility], [place] [address] [place], [place]  [address]  [phone]  10/06/2018 9:46 AM

## 2018-10-06 NOTE — Progress Notes (Signed)
ANTICOAGULATION CONSULT NOTE - Follow Up Consult  Pharmacy Consult for Heparin Indication: chest pain/ACS  Allergies  Allergen Reactions  . Penicillins Hives, Itching and Rash    Did it involve swelling of the face/tongue/throat, SOB, or low BP? Unknown Did it involve sudden or severe rash/hives, skin peeling, or any reaction on the inside of your mouth or nose? Unknown Did you need to seek medical attention at a hospital or doctor's office? Unknown When did it last happen?pt cant recall If all above answers are "NO", may proceed with cephalosporin use.   . Rosuvastatin Other (See Comments)    REACTION: muscle weakness  . Simvastatin Other (See Comments)    REACTION: muscle weakness  . Statins Other (See Comments)    Myalgias and weakness  . Sulfonamide Derivatives Other (See Comments)    Nausea, vomiting, abd pain  . Sulfa Antibiotics Other (See Comments)    Nausea, vomiting, abd pain    Patient Measurements: Height: 5\' 4"  (162.6 cm) Weight: 168 lb 3.2 oz (76.3 kg) IBW/kg (Calculated) : 54.7 Heparin Dosing Weight: 70.8 kg  Vital Signs: Temp: 97.6 F (36.4 C) (07/06 0359) Temp Source: Oral (07/06 0359) BP: 111/74 (07/06 0359) Pulse Rate: 59 (07/06 0359)  Labs: Recent Labs    10/03/18 1114 10/03/18 1258  10/04/18 0312 10/04/18 0819 10/05/18 0417 10/06/18 0548  HGB 13.9  --   --  13.1  --  13.3 12.8  HCT 41.7  --   --  39.1  --  39.3 37.8  PLT 264  --   --  227  --  241 227  APTT 33  --   --   --   --   --   --   LABPROT 12.4  --   --   --   --   --   --   INR 0.9  --   --   --   --   --   --   HEPARINUNFRC  --   --    < > 0.38  --  0.37 0.37  CREATININE 0.50  --   --  0.65  --   --  0.63  TROPONINIHS 86* 104*  --   --  130*  --   --    < > = values in this interval not displayed.    Estimated Creatinine Clearance: 70.1 mL/min (by C-G formula based on SCr of 0.63 mg/dL).  Assessment:   Anticoag: Heparin for ACS/NSTEMI, troponin 86>104>130, HL 0.37  in goal. CBC WNL and stable.  Goal of Therapy:  Heparin level 0.3-0.7 units/ml Monitor platelets by anticoagulation protocol: Yes   Plan:  Continue Heparin gtt at 850 units/hr Daily heparin level and CBC Cath today   Sloan Leiter, PharmD, BCPS, BCCCP Clinical Pharmacist Please refer to [facility] for [place] numbers 10/06/2018,9:28 AM

## 2018-10-06 NOTE — Care Management Important Message (Signed)
Important Message  Patient Details  Name: Heather Merritt MRN: [id number] Date of Birth: 1955-10-12   Medicare Important Message Given:  Yes     Shelda Altes 10/06/2018, 2:01 PM

## 2018-10-06 NOTE — Progress Notes (Signed)
  Echocardiogram 2D Echocardiogram has been performed.  Heather Merritt 10/06/2018, 5:43 PM

## 2018-10-06 NOTE — Interval H&P Note (Signed)
Cath Lab Visit (complete for each Cath Lab visit)  Clinical Evaluation Leading to the Procedure:   ACS: Yes.    Non-ACS:    Anginal Classification: CCS III  Anti-ischemic medical therapy: Minimal Therapy (1 class of medications)  Non-Invasive Test Results: No non-invasive testing performed  Prior CABG: No previous CABG      History and Physical Interval Note:  10/06/2018 1:57 PM  Heather Merritt  has presented today for surgery, with the diagnosis of NSTEMI.  The various methods of treatment have been discussed with the patient and family. After consideration of risks, benefits and other options for treatment, the patient has consented to  Procedure(s): LEFT HEART CATH AND CORONARY ANGIOGRAPHY (N/A) as a surgical intervention.  The patient's history has been reviewed, patient examined, no change in status, stable for surgery.  I have reviewed the patient's chart and labs.  Questions were answered to the patient's satisfaction.     Quay Burow

## 2018-10-06 NOTE — Progress Notes (Signed)
0030 pt complained of mid sternal epigastric area "discomfort" that was radiating through to her back. Pt unable to quantify on 0/10 scale. BP 141/76. EKG obtained with no acute changes noted. MD on call paged and made aware of events with orders received for NS 250 cc bolus and Morphine 2mg  IV x1 dose. Orders carried out pt is currently pain free and VS stable(refer to flowsheet). Will continue to monitor. Jessie Foot, RN

## 2018-10-07 ENCOUNTER — Telehealth: Payer: Self-pay | Admitting: Cardiovascular Disease

## 2018-10-07 ENCOUNTER — Encounter (HOSPITAL_COMMUNITY): Payer: Self-pay | Admitting: Cardiovascular Disease

## 2018-10-07 ENCOUNTER — Encounter: Payer: Managed Care, Other (non HMO) | Admitting: Internal Medicine

## 2018-10-07 LAB — CBC
HCT: 36.5 % (ref 36.0–46.0)
Hemoglobin: 12.2 g/dL (ref 12.0–15.0)
MCH: 30.5 pg (ref 26.0–34.0)
MCHC: 33.4 g/dL (ref 30.0–36.0)
MCV: 91.3 fL (ref 80.0–100.0)
Platelets: 213 10*3/uL (ref 150–400)
RBC: 4 MIL/uL (ref 3.87–5.11)
RDW: 12.2 % (ref 11.5–15.5)
WBC: 4.9 10*3/uL (ref 4.0–10.5)
nRBC: 0 % (ref 0.0–0.2)

## 2018-10-07 LAB — BASIC METABOLIC PANEL
Anion gap: 8 (ref 5–15)
BUN: 13 mg/dL (ref 8–23)
CO2: 28 mmol/L (ref 22–32)
Calcium: 8.9 mg/dL (ref 8.9–10.3)
Chloride: 105 mmol/L (ref 98–111)
Creatinine, Ser: 0.79 mg/dL (ref 0.44–1.00)
GFR calc Af Amer: >60 mL/min (ref >60)
GFR calc non Af Amer: >60 mL/min (ref >60)
Glucose, Bld: 111 mg/dL — ABNORMAL HIGH (ref 70–99)
Potassium: 4.1 mmol/L (ref 3.5–5.1)
Sodium: 141 mmol/L (ref 135–145)

## 2018-10-07 MED ORDER — TICAGRELOR 90 MG PO TABS: 90.0000 mg | ORAL_TABLET | Freq: Two times a day (BID) | ORAL | 11 refills | Status: DC

## 2018-10-07 MED ORDER — NITROGLYCERIN 0.4 MG SL SUBL: 0.4000 mg | SUBLINGUAL_TABLET | SUBLINGUAL | 12 refills | Status: DC | PRN

## 2018-10-07 MED FILL — Ticagrelor Tab 90 MG: ORAL | Qty: 1 | Status: AC

## 2018-10-07 MED FILL — NITROGLYCERIN 0.4 MG TAB SL: 0.4 | 8 days supply | Qty: 25 | Fill #0

## 2018-10-07 MED FILL — BRILINTA 90 MG TABLET: 90 | 30 days supply | Qty: 60 | Fill #0

## 2018-10-07 NOTE — Discharge Summary (Addendum)
Discharge Summary    Patient ID: Heather Merritt MRN: [id number]; DOB: 07-Jul-1955  Admit date: 10/03/2018 Discharge date: 10/07/2018  Primary Care Provider: Biagio Borg, MD  Primary Cardiologist: Quay Burow, MD   Discharge Diagnoses    Active Problems:   NSTEMI (non-ST elevated myocardial infarction) ([place])   CAD   HTN   HLD   GERD  Allergies Allergies  Allergen Reactions   Penicillins Hives, Itching and Rash    Did it involve swelling of the face/tongue/throat, SOB, or low BP? Unknown Did it involve sudden or severe rash/hives, skin peeling, or any reaction on the inside of your mouth or nose? Unknown Did you need to seek medical attention at a hospital or doctor's office? Unknown When did it last happen?pt cant recall If all above answers are NO, may proceed with cephalosporin use.    Rosuvastatin Other (See Comments)    REACTION: muscle weakness   Simvastatin Other (See Comments)    REACTION: muscle weakness   Statins Other (See Comments)    Myalgias and weakness   Sulfonamide Derivatives Other (See Comments)    Nausea, vomiting, abd pain   Sulfa Antibiotics Other (See Comments)    Nausea, vomiting, abd pain    Diagnostic Studies/Procedures    Echo 10/06/2018 IMPRESSIONS  1. The left ventricle has normal systolic function, with an ejection fraction of 55-60%. The cavity size was normal. Left ventricular diastolic parameters were normal. No evidence of left ventricular regional wall motion abnormalities.  2. The right ventricle has normal systolic function. The cavity was normal. There is no increase in right ventricular wall thickness. Right ventricular systolic pressure could not be assessed.  3. No evidence of mitral valve stenosis.  4. The aortic valve is tricuspid. No stenosis of the aortic valve.  5. The aortic root and aortic arch are normal in size and structure.   CORONARY STENT INTERVENTION  10/06/2018  LEFT HEART CATH AND CORONARY  ANGIOGRAPHY  Conclusion    2nd Diag lesion is 90% stenosed.  Prox RCA to Mid RCA lesion is 95% stenosed.  Mid RCA lesion is 30% stenosed.  A drug-eluting stent was successfully placed using a STENT SYNERGY DES 2.5X20.  Post intervention, there is a 0% residual stenosis.  The left ventricular systolic function is normal.  LV end diastolic pressure is normal.  The left ventricular ejection fraction is 55-65% by visual estimate.   IMPRESSION: Successful PCI and drug-eluting stenting of a high-grade mid dominant RCA stenosis in the setting of accelerated angina/ACS and non-STEMI.  Patient does have a high-grade ostial/proximal second diagonal branch stenosis and a fairly small vessel which will be treated medically at this time.  She has normal LV function.  She will need to be on dual antiplatelet therapy including aspirin Brilinta uninterrupted for 12 months.  It is anticipated this will be discharged home in the morning.   Coronary CT 10/04/2018 IMPRESSION: 1. Coronary calcium score of 123. This was 4 percentile for age and sex matched control.  2. Normal coronary origin with right dominance.  3. 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 A cardiac catheterization is recommended  CT FFR ANALYSIS  1. Left Main: 0.98. 2. LAD: 0.94. 3. LCX: 0.94. 4. RCA: Proximal: 0.95, distal: 0.59.  IMPRESSION: 1. CT FFR analysis showed severe stenosis in the mid RCA. A cardiac catheterization is recommended.   History of Present Illness     Heather Merritt is a 63 year old female with a history  ofHLD, HTN, history of hiatal hernia, anxiety, and GERD who is admitted for unstable angina and found to have elevated troponin.   She presented to [facility] on 10/03/2018 with complaints of chest pain x 3 days which described as a burning sensation in her throat and radiates to her left shoulder.  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  She has a history of hiatal hernia and thought this was related.  She states her symptoms have been happening daily x 3 days however became worrisome after she woke at 6 AM with shoulder involvement.  She stated her symptoms would worsen with exertion, moving boxes and would relieve with rest.  She had no associated symptoms including shortness of breath, nausea, vomiting, diaphoresis.  She has no prior history of CAD however has a family history of CAD in her mother.  She has occasional alcohol use and no illicit drug use.  She is a remote smoker.  She presented to the ED for further evaluation.  In the ED, HsT found to be elevated at 86> 104.  EKG with NSR and TWI in lead V1 with no change from prior tracings from 06/19/2016.  She was started on IV heparin per pharmacy.  Home medications include amlodipine, ASA 81, Diovan. Transferred to [facility] under cardiology service.   Of note, patient underwent a myocardial perfusion stress test on 06/30/2014 for resting chest pain with a known family history of CAD.  Hospital Course     Consultants: None  1. NSTEMI - Hs troponin 86->104->130. EKG with TWI in inferior lead. Treated with IV heparin. A CT FFR with mid/distal RCA disease. She did had chest pain episode prior to cath and was treated with morphine as well as nitroglycerin which resulted in hypotension responding to fluid resuscitation.  Cath showed high grade stenosis in mRCA s/p PTCA & DES. Medical therapy for high-grade ostial/proximal second diagonal branch (small vessel). Echo showed LVEF of 55-60%. No recurrent chest pain. Ambulated well. Continue ASA, Brillinta and statin.   2. HLD - 10/04/2018: Cholesterol 204; HDL 36; LDL Cholesterol 112; Triglycerides 280; VLDL 56  - Statin intolerance. On Lipitor 10mg  daily.  - Lipid clinic evaluation for PCSK9 inhibitor as outpatient.   3. HTN -   BP stable on amlodipine and Diovan-HCT - She already got her  amlodipine 2.5mg  this morning, may consider changing to BB as outpatient.   4. GERD/hiatal hernia  - Continue PPI  Discharge Vitals Blood pressure 120/77, pulse 66, temperature 97.6 F (36.4 C), temperature source Oral, resp. rate 12, height 5\' 4"  (1.626 m), weight 76.6 kg, SpO2 97 %.  Filed Weights   10/05/18 0402 10/06/18 0359 10/07/18 0529  Weight: 75.7 kg 76.3 kg 76.6 kg   Physical Exam  Constitutional: She is oriented to person, place, and time. She appears well-developed and well-nourished.  HENT:  Head: Normocephalic and atraumatic.  Eyes: Pupils are equal, round, and reactive to light. EOM are normal.  Neck: Normal range of motion. Neck supple.  Cardiovascular: Normal rate and regular rhythm.  Right radial cath site without hematoma   Pulmonary/Chest: Effort normal and breath sounds normal.  Abdominal: Soft. Bowel sounds are normal.  Musculoskeletal: Normal range of motion.  Neurological: She is alert and oriented to person, place, and time.  Skin: Skin is warm and dry.  Psychiatric: She has a normal mood and affect.   Labs & Radiologic Studies  CBC Recent Labs    10/06/18 0548 10/07/18 0417  WBC 3.9* 4.9  HGB 12.8 12.2  HCT 37.8 36.5  MCV 89.8 91.3  PLT 227 332   Basic Metabolic Panel Recent Labs    10/06/18 0548 10/07/18 0417  NA 139 141  K 3.9 4.1  CL 105 105  CO2 26 28  GLUCOSE 115* 111*  BUN 15 13  CREATININE 0.63 0.79  CALCIUM 9.1 8.9  _____________  Dg Chest 2 View  Result Date: 10/03/2018 CLINICAL DATA:  Intermittent chest pain for 1 week EXAM: CHEST - 2 VIEW COMPARISON:  June 18, 2016 FINDINGS: The heart size and mediastinal contours are within normal limits. Minimal atelectasis or scar is identified in the left lung base. There is no focal infiltrate, pulmonary edema, or pleural effusion. The visualized skeletal structures are stable. IMPRESSION: No active cardiopulmonary disease. Electronically Signed   By: Abelardo Diesel M.D.   On:  10/03/2018 11:45   Ct Coronary Morph W/cta Cor W/score W/ca W/cm &/or Wo/cm  Addendum Date: 10/04/2018   ADDENDUM REPORT: 10/04/2018 14:56 CLINICAL DATA:  63 year old female with chest pain and borderline elevated troponin. EXAM: Cardiac/Coronary  CT TECHNIQUE: The patient was scanned on a [employer]. FINDINGS: A 120 kV prospective scan was triggered in the descending thoracic aorta at 111 HU's. Axial non-contrast 3 mm slices were carried out through the heart. The data set was analyzed on a dedicated work station and scored using the [place]. Gantry rotation speed was 250 msecs and collimation was .6 mm. No beta blockade and 0.8 mg of sl NTG was given. The 3D data set was reconstructed in 5% intervals of the 67-82 % of the R-R cycle. Diastolic phases were analyzed on a dedicated work station using MPR, MIP and VRT modes. The patient received 80 cc of contrast. Aorta:  Normal size.  No calcifications.  No dissection. Aortic Valve:  Trileaflet.  No calcifications. Coronary Arteries:  Normal coronary origin.  Right dominance. RCA is a large dominant artery that gives rise to PDA and PLA. Proximal RCA has minimal plaque. Mid RCA has severe non-calcified plaque with subtotal occlusion > 90%. Distal RCA/PDA/PLA have only mild plaque. Left main is a large artery that gives rise to LAD, ramus intermedius and LCX arteries. Left main has no significant plaque. LAD is a large vessel that has mild mixed plaque in the ostial/proximal portion with stenosis 25-49%. Mid and distal LAD have only minimal plaque. Ramus intermedius is a medium size artery with minimal plaque. LCX is a small caliber non-dominant artery that gives rise to one small OM1 branch. There is minimal plaque. Other findings: Normal pulmonary vein drainage into the left atrium. Normal let atrial appendage without a thrombus. Normal size of the pulmonary artery. IMPRESSION: 1. Coronary calcium score of 123. This was 63 percentile for age and  sex matched control. 2. Normal coronary origin with right dominance. 3. 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 A cardiac catheterization is recommended Electronically Signed   By: Ena Dawley   On: 10/04/2018 14:56   Result Date: 10/04/2018 EXAM: OVER-READ INTERPRETATION  CT CHEST The following report is an over-read performed by radiologist Dr. Abigail Miyamoto of [facility], [place] on 10/04/2018. This over-read does not include interpretation of cardiac or coronary anatomy or pathology. The coronary CTA interpretation by the cardiologist is attached. COMPARISON:  Chest radiograph 10/03/2018.  No prior CT. FINDINGS: Vascular: Normal aortic caliber. No central pulmonary embolism, on  this non-dedicated study. Mediastinum/Nodes: No imaged thoracic adenopathy. Lungs/Pleura: Left lower lobe scarring. Upper Abdomen: Hepatic steatosis.  Proximal gastric underdistention. Incompletely imaged hypoattenuation within the spleen, including anteriorly on image 45/11. No perisplenic fluid. Musculoskeletal: No acute osseous abnormality. IMPRESSION: 1. Incompletely imaged anterior splenic hypoenhancement, suspicious for infarct of indeterminate acuity. 2.  No acute findings in the imaged extracardiac chest. 3. Hepatic steatosis. Electronically Signed: By: Abigail Miyamoto M.D. On: 10/04/2018 11:28   Ct Coronary Fractional Flow Reserve Data Prep  Result Date: 10/05/2018 EXAM: CT FFR ANALYSIS CLINICAL DATA:  63 year old female with chest pain. FINDINGS: FFRct analysis was performed on the original cardiac CT angiogram dataset. Diagrammatic representation of the FFRct analysis is provided in a separate PDF document in PACS. This dictation was created using the PDF document and an interactive 3D model of the results. 3D model is not available in the EMR/PACS. Normal FFR range is >0.80. 1. Left Main: 0.98. 2. LAD: 0.94. 3. LCX: 0.94. 4. RCA: Proximal: 0.95, distal: 0.59. IMPRESSION: 1. CT FFR analysis showed severe  stenosis in the mid RCA. A cardiac catheterization is recommended. Electronically Signed   By: Ena Dawley   On: 10/05/2018 13:40   Disposition   Pt is being discharged home today in good condition.  Follow-up Plans & Appointments    Follow-up Information    Almyra Deforest, [place]. Go on 10/16/2018.   Specialties: Cardiology, Radiology Why: @10 :45am for hospital follow up Contact information: [address] [place] [address] [phone]          Discharge Instructions    AMB Referral to Cardiac Rehabilitation - Phase II   Complete by: As directed    Diagnosis: NSTEMI   After initial evaluation and assessments completed: Virtual Based Care may be provided alone or in conjunction with Phase 2 Cardiac Rehab based on patient barriers.: Yes      Discharge Medications   Allergies as of 10/07/2018      Reactions   Penicillins Hives, Itching, Rash   Did it involve swelling of the face/tongue/throat, SOB, or low BP? Unknown Did it involve sudden or severe rash/hives, skin peeling, or any reaction on the inside of your mouth or nose? Unknown Did you need to seek medical attention at a hospital or doctor's office? Unknown When did it last happen?pt cant recall If all above answers are NO, may proceed with cephalosporin use.   Rosuvastatin Other (See Comments)   REACTION: muscle weakness   Simvastatin Other (See Comments)   REACTION: muscle weakness   Statins Other (See Comments)   Myalgias and weakness   Sulfonamide Derivatives Other (See Comments)   Nausea, vomiting, abd pain   Sulfa Antibiotics Other (See Comments)   Nausea, vomiting, abd pain      Medication List    TAKE these medications   amLODipine 2.5 MG tablet Commonly known as: NORVASC Take 1 tablet (2.5 mg total) by mouth daily. Follow-up appt due in May must see provider for future refills   aspirin 81 MG tablet Take 81 mg by mouth daily.   atorvastatin 10 MG tablet Commonly known as:  LIPITOR TAKE 1 TABLET BY MOUTH  EVERY OTHER DAY   CITRACAL PO Take 2 tablets by mouth daily.   fish oil-omega-3 fatty acids 1000 MG capsule Take 1 g by mouth daily.   ibuprofen 200 MG tablet Commonly known as: ADVIL Take 800 mg by mouth every 6 (six) hours as needed for moderate pain. Pain   lansoprazole 30  MG capsule Commonly known as: PREVACID Take 30 mg by mouth daily at 12 noon.   metroNIDAZOLE 0.75 % gel Commonly known as: METROGEL Apply 1 application topically at bedtime.   multivitamin with minerals Tabs tablet Take 1 tablet by mouth daily.   nitroGLYCERIN 0.4 MG SL tablet Commonly known as: NITROSTAT Place 1 tablet (0.4 mg total) under the tongue every 5 (five) minutes x 3 doses as needed for chest pain.   pimecrolimus 1 % cream Commonly known as: ELIDEL Apply 1 application topically every morning.   ticagrelor 90 MG Tabs tablet Commonly known as: BRILINTA Take 1 tablet (90 mg total) by mouth 2 (two) times daily.   valsartan-hydrochlorothiazide 160-12.5 MG tablet Commonly known as: DIOVAN-HCT Take 1 tablet by mouth daily. Follow-up appt due in May must see provider for future refills   Vitamin D 50 MCG (2000 UT) Caps Take 2,000 Units by mouth daily.   vitamin E 400 UNIT capsule Take 400 Units by mouth daily.        Acute coronary syndrome (MI, NSTEMI, STEMI, etc) this admission?: Yes.     AHA/ACC Clinical Performance & Quality Measures: 1. Aspirin prescribed? - Yes 2. ADP Receptor Inhibitor (Plavix/Clopidogrel, Brilinta/Ticagrelor or Effient/Prasugrel) prescribed (includes medically managed patients)? - Yes 3. Beta Blocker prescribed? - No - Plan to add as outpatient  4. High Intensity Statin (Lipitor 40-80mg  or Crestor 20-40mg ) prescribed? - No - Stain intolerance - Lipid clinic evaluation as outpatient  5. EF assessed during THIS hospitalization? - Yes 6. For EF <40%, was ACEI/ARB prescribed? - Not Applicable (EF >/= 06%) 7. For EF <40%, Aldosterone  Antagonist (Spironolactone or Eplerenone) prescribed? - Not Applicable (EF >/= 26%) 8. Cardiac Rehab Phase II ordered (Included Medically managed Patients)? - Yes     Outstanding Labs/Studies   Lipid clinic evaluation   Duration of Discharge Encounter   Greater than 30 minutes including physician time.  Signed, Leanor Kail, PA 10/07/2018, 9:31 AM   Agree with note by Robbie Lis PA-C  Heather Merritt. is stable for discharge today.  I performed radial diagnostic cath, RCA PCI and drug-eluting stenting yesterday with an excellent clinical result.  She does have a high-grade diagonal branch stenosis and D2 not favorable for intervention and this is a relatively small vessel.  She has preserved LV function.  She is on aspirin and Brilinta and has remained pain-free.  Exam is benign.  We will refer her to the lipid clinic for further evaluation of potential treatment with PCSK9.   Lorretta Harp, M.D., [place], [facility], [place] [address] [place], [place]  [address]  [phone] 10/07/2018 9:41 AM

## 2018-10-07 NOTE — Telephone Encounter (Signed)
New Message    Patient has TOC appt 10/16/18 with Almyra Deforest @ 10:45am.

## 2018-10-07 NOTE — Plan of Care (Signed)
  Problem: Clinical Measurements: Goal: Will remain free from infection Outcome: Adequate for Discharge Goal: Diagnostic test results will improve Outcome: Adequate for Discharge Goal: Cardiovascular complication will be avoided Outcome: Adequate for Discharge   Problem: Coping: Goal: Level of anxiety will decrease Outcome: Progressing   Problem: Pain Managment: Goal: General experience of comfort will improve Outcome: Progressing   Problem: Safety: Goal: Ability to remain free from injury will improve Outcome: Adequate for Discharge   Problem: Activity: Goal: Ability to return to baseline activity level will improve Outcome: Progressing   Problem: Cardiovascular: Goal: Vascular access site(s) Level 0-1 will be maintained Outcome: Adequate for Discharge

## 2018-10-07 NOTE — Telephone Encounter (Signed)
Patient is still currently admitted.  TOC call for 07/08

## 2018-10-07 NOTE — Care Management (Signed)
10-07-18 1232 CM did speak with patient regarding Brilinta and cost. Patient is aware of co pay at $247.00 for 30 day supply. Patient aware to contact [employer] to see what 90 day cost will be- should be cheaper, however patient may need to meet deductible. CM also stated that pt needs to call [place] for patient assistance program-she may qualify. CM also made patient aware to not miss a dose. If patient cannot get medications to call Cardiologists to see if they have samples in the office. No further home needs identified at this time. Bethena Roys, RN,BSM Case Manager [phone]

## 2018-10-07 NOTE — Progress Notes (Signed)
CARDIAC REHAB PHASE I   PRE:  Rate/Rhythm: 71 SR  BP:  Sitting: 136/64      SaO2: 98 RA  MODE:  Ambulation: 470 ft   POST:  Rate/Rhythm: 88 SR  BP:  Sitting: 154/81    SaO2: 98 RA   Pt ambulated 465ft in hallway independently with steady gait. Pt denies CP or SOB. Pt educated on importance of ASA, Brilinta, statin, and NTG. Pt given heart healthy diet. Reviewed restrictions, site care, and exercise guidelines. Pt appears t be anxious with lots of questions. Will refer to CRP II [place], pt not interested in Virtual Cardiac Rehab at this time.  [phone] Rufina Falco, RN BSN 10/07/2018 9:50 AM

## 2018-10-07 NOTE — Telephone Encounter (Signed)
The pt is following up with Almyra Deforest in our [place] office. Will forward note to that office.

## 2018-10-08 ENCOUNTER — Telehealth: Payer: Self-pay | Admitting: *Deleted

## 2018-10-08 NOTE — Telephone Encounter (Signed)
That's fine with me. Chi, can you have a note included in her chart that she can return to work at home on Monday?  JJB

## 2018-10-08 NOTE — Telephone Encounter (Signed)
Patient contacted regarding discharge from [facility] on 10/07/2018.  Patient understands to follow up with provider Almyra Deforest, PA on 10/16/2018 at 10:45 AM at [facility] office. Patient understands discharge instructions? Yes Patient understands medications and regiment? Yes Patient understands to bring all medications to this visit? 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 Advised I would route message to MD and Nurse to make aware.

## 2018-10-08 NOTE — Telephone Encounter (Signed)
Letter sent via Chart Review > Route > Patient > Mychart Letter also mailed to pt address on file

## 2018-10-08 NOTE — Telephone Encounter (Signed)
Pt was on TCM report admitted 10/03/18 for unstable angina and found to have elevated troponin. Pt had CT FFR analysis which showed severe stenosis in the mid RCA. A cardiac catheterization was recommended. Cath showed high grade stenosis in mRCA s/p PTCA & DES. Medical therapy for high-grade ostial/proximal second diagonal branch (small vessel). Echo showed LVEF of 55-60%. No recurrent chest pain. Ambulated well. Continue ASA, Brillinta and statin. Pt D/C 10/07/18, and will follow=up w/cardiology 10/16/18.Marland KitchenJohny Chess

## 2018-10-09 ENCOUNTER — Telehealth: Payer: Self-pay | Admitting: Cardiovascular Disease

## 2018-10-09 NOTE — Telephone Encounter (Signed)
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 She has experienced flutters pre-stent as well. Explained that the blocked artery is the "plumbing" and the flutters is the "electrical" components of her cardiac function. She has no chest pain, no complaints at present.   She asked if OK to take tylenol for cervical disc pain. Advised this is OK, preferred over aleve, et  She has PA f/up 7/16   No further assistance needed at this time

## 2018-10-09 NOTE — Telephone Encounter (Signed)
New Message    Patient just had a stint placed and wants to talk about after effects and what she shouldn't and should be feeling.

## 2018-10-12 ENCOUNTER — Telehealth: Payer: Self-pay | Admitting: Cardiology

## 2018-10-12 ENCOUNTER — Emergency Department (HOSPITAL_BASED_OUTPATIENT_CLINIC_OR_DEPARTMENT_OTHER)
Admission: EM | Admit: 2018-10-12 | Discharge: 2018-10-12 | Disposition: A | Discharge status: Home / Self Care | Payer: Medicare Other | Attending: Emergency Medicine | Admitting: Emergency Medicine

## 2018-10-12 ENCOUNTER — Other Ambulatory Visit: Payer: Self-pay

## 2018-10-12 ENCOUNTER — Encounter (HOSPITAL_BASED_OUTPATIENT_CLINIC_OR_DEPARTMENT_OTHER): Payer: Self-pay | Admitting: Emergency Medicine

## 2018-10-12 DIAGNOSIS — I2510 Atherosclerotic heart disease of native coronary artery without angina pectoris: Secondary | ICD-10-CM | POA: Diagnosis not present

## 2018-10-12 DIAGNOSIS — T7840XA Allergy, unspecified, initial encounter: Secondary | ICD-10-CM | POA: Insufficient documentation

## 2018-10-12 DIAGNOSIS — I10 Essential (primary) hypertension: Secondary | ICD-10-CM | POA: Diagnosis not present

## 2018-10-12 DIAGNOSIS — Z7982 Long term (current) use of aspirin: Secondary | ICD-10-CM | POA: Insufficient documentation

## 2018-10-12 DIAGNOSIS — Z87891 Personal history of nicotine dependence: Secondary | ICD-10-CM | POA: Diagnosis not present

## 2018-10-12 DIAGNOSIS — Z955 Presence of coronary angioplasty implant and graft: Secondary | ICD-10-CM | POA: Insufficient documentation

## 2018-10-12 DIAGNOSIS — I252 Old myocardial infarction: Secondary | ICD-10-CM | POA: Insufficient documentation

## 2018-10-12 DIAGNOSIS — Z79899 Other long term (current) drug therapy: Secondary | ICD-10-CM | POA: Diagnosis not present

## 2018-10-12 DIAGNOSIS — R21 Rash and other nonspecific skin eruption: Secondary | ICD-10-CM | POA: Diagnosis present

## 2018-10-12 HISTORY — DX: Acute myocardial infarction, unspecified: I21.9

## 2018-10-12 MED ORDER — CLOPIDOGREL BISULFATE 75 MG PO TABS
150.0000 mg | ORAL_TABLET | Freq: Once | ORAL | Status: AC
  Administered 2018-10-12: 150 mg via ORAL
  Filled 2018-10-12: qty 2

## 2018-10-12 MED ORDER — CLOPIDOGREL BISULFATE 75 MG PO TABS: 75.0000 mg | ORAL_TABLET | Freq: Every day | ORAL | 0 refills | Status: DC

## 2018-10-12 NOTE — ED Provider Notes (Signed)
[facility] EMERGENCY DEPARTMENT Provider Note   CSN: [id number] Arrival date & time: 10/12/18  1004     History   Chief Complaint Chief Complaint  Patient presents with  . Allergic Reaction    HPI Heather Merritt is a 63 y.o. female.     Patient states that since yesterday she has developed a rash on her back that was pruritic.  She recently started Brilinta after being discharged from the hospital for a stent placement after an NSTEMI.  She started taking the medication on Monday and did not notice a rash until Saturday, but has been having some increasing pruritus since Thursday.  She denies tightening of her throat, difficulty breathing, rash in any other part of her body, watery eyes, abdominal pain, chest pain.  Patient has not used any new detergents lately or soaps.  Patient states that she might be allergic to the tape, as she started developed a mild rash where there was tape on her during her hospital admission.     Past Medical History:  Diagnosis Date  . Allergy    SEASONAL  . ANXIETY 10/28/2007  . Arthritis   . CAD (coronary artery disease), native coronary artery    a. Cath 10/06/2018 - S/p DES to [facility]; medical therapy for high grade ostial/pro 2nd digonal   . Cataracts, bilateral    immature  . Chronic back pain   . DIVERTICULOSIS, COLON 10/28/2007  . Dry skin    red patch on right knee area  . GERD 10/28/2007   takes Prevacid daily  . H/O hiatal hernia   . HLD (hyperlipidemia) 12/12/2017  . Hx of colonic polyps   . HYPERLIPIDEMIA 10/28/2007   unable to take meds d/t allergies  . HYPERTENSION 10/28/2007   takes Diovan daily  . Internal hemorrhoids   . Irritable bowel syndrome (IBS)   . Joint pain   . Joint swelling   . LIVER FUNCTION TESTS, ABNORMAL, HX OF 10/29/2007  . Lumbar disc disease   . Migraine   . Myocardial infarction ([place])   . Obesity   . OSTEOPENIA 10/29/2007  . Osteoporosis   . Presbyacusis 01/04/2010  . Rosacea   . Seasonal  allergies    takes Clarinex daily  . Vertigo    hx of  . Vitamin D deficiency    takes Vit D daily    Patient Active Problem List   Diagnosis Date Noted  . NSTEMI (non-ST elevated myocardial infarction) ([place]) 10/03/2018  . HLD (hyperlipidemia) 12/12/2017  . Microhematuria 06/05/2016  . Anterior neck pain 03/09/2016  . Vertigo 03/09/2016  . PAC (premature atrial contraction) 04/29/2015  . History of colonic polyps 04/29/2015  . Cervical radiculitis 12/21/2014  . Pain in the chest 04/15/2014  . Osteoporosis 02/27/2011  . Lumbar disc disease 02/27/2011  . Back pain 02/27/2011  . Encounter for well adult exam with abnormal findings 02/24/2011  . Presbyacusis 01/04/2010  . LIVER FUNCTION TESTS, ABNORMAL, HX OF 10/29/2007  . Anxiety state 10/28/2007  . Essential hypertension 10/28/2007  . GERD 10/28/2007  . DIVERTICULOSIS, COLON 10/28/2007    Past Surgical History:  Procedure Laterality Date  . ANTERIOR CERVICAL DECOMP/DISCECTOMY FUSION  08/02/2011   Procedure: ANTERIOR CERVICAL DECOMPRESSION/DISCECTOMY FUSION 3 LEVELS;  Surgeon: Elaina Hoops, MD;  Location: [facility];  Service: Neurosurgery;  Laterality: N/A;  Cervical Three-Four, Cervical Four-Five, Cervical Five-Six  Anterior Cervical Decompression Fusion   . COLONOSCOPY  2001/2013  . CORONARY STENT INTERVENTION N/A 10/06/2018  Procedure: CORONARY STENT INTERVENTION;  Surgeon: Lorretta Harp, MD;  Location: [place] [facility];  Service: Cardiovascular;  Laterality: N/A;  . ESOPHAGOGASTRODUODENOSCOPY    . LEFT HEART CATH AND CORONARY ANGIOGRAPHY N/A 10/06/2018   Procedure: LEFT HEART CATH AND CORONARY ANGIOGRAPHY;  Surgeon: Lorretta Harp, MD;  Location: [facility];  Service: Cardiovascular;  Laterality: N/A;  . POLYPECTOMY    . TONSILLECTOMY    . TONSILLECTOMY  at age 19     OB History   No obstetric history on file.      Home Medications    Prior to Admission medications   Medication Sig Start Date End  Date Taking? Authorizing Provider  amLODipine (NORVASC) 2.5 MG tablet Take 1 tablet (2.5 mg total) by mouth daily. Follow-up appt due in May must see provider for future refills 06/30/18   Biagio Borg, MD  aspirin 81 MG tablet Take 81 mg by mouth daily.      [provider]  atorvastatin (LIPITOR) 10 MG tablet TAKE 1 TABLET BY MOUTH  EVERY OTHER DAY 06/30/18   Biagio Borg, MD  Calcium Citrate (CITRACAL PO) Take 2 tablets by mouth daily.     [provider]  Cholecalciferol (VITAMIN D) 2000 UNITS CAPS Take 2,000 Units by mouth daily.    [provider]  clopidogrel (PLAVIX) 75 MG tablet Take 1 tablet (75 mg total) by mouth daily. 10/12/18 10/12/19  Benay Pike, MD  fish oil-omega-3 fatty acids 1000 MG capsule Take 1 g by mouth daily.     [provider]  lansoprazole (PREVACID) 30 MG capsule Take 30 mg by mouth daily at 12 noon.    [provider]  metroNIDAZOLE (METROGEL) 0.75 % gel Apply 1 application topically at bedtime. 06/11/17   Biagio Borg, MD  Multiple Vitamin (MULITIVITAMIN WITH MINERALS) TABS Take 1 tablet by mouth daily.    [provider]  nitroGLYCERIN (NITROSTAT) 0.4 MG SL tablet Place 1 tablet (0.4 mg total) under the tongue every 5 (five) minutes x 3 doses as needed for chest pain. 10/07/18   Bhagat, Crista Luria, PA  pimecrolimus (ELIDEL) 1 % cream Apply 1 application topically every morning. 06/11/17   Biagio Borg, MD  ticagrelor (BRILINTA) 90 MG TABS tablet Take 1 tablet (90 mg total) by mouth 2 (two) times daily. 10/07/18   Bhagat, Crista Luria, PA  valsartan-hydrochlorothiazide (DIOVAN-HCT) 160-12.5 MG tablet Take 1 tablet by mouth daily. Follow-up appt due in May must see provider for future refills 06/30/18   Biagio Borg, MD  vitamin E 400 UNIT capsule Take 400 Units by mouth daily.    [provider]    Family History Family History  Problem Relation Age of Onset  . Atrial fibrillation Mother   .  Hyperlipidemia Mother   . Stroke Mother   . Heart disease Mother   . Atrial fibrillation Father   . Hypothyroidism Sister   . Hypothyroidism Maternal Grandmother   . Diabetes Maternal Grandmother   . Anesthesia problems Neg Hx   . Hypotension Neg Hx   . Malignant hyperthermia Neg Hx   . Pseudochol deficiency Neg Hx     Social History Social History   Tobacco Use  . Smoking status: Former Research scientist (life sciences)  . Smokeless tobacco: Never Used  . Tobacco comment: quit in 87yrs ago  Substance Use Topics  . Alcohol use: Yes    Alcohol/week: 0.0 standard drinks    Comment: occ beer/drink  .  Drug use: No     Allergies   Penicillins, Rosuvastatin, Simvastatin, Statins, Sulfonamide derivatives, and Sulfa antibiotics   Review of Systems Review of Systems  Constitutional: Negative for fever.  HENT: Negative for sneezing, sore throat and trouble swallowing.   Eyes: Negative for redness and itching.  Respiratory: Negative for cough, choking, chest tightness and shortness of breath.   Cardiovascular: Negative for chest pain.  Gastrointestinal: Negative for diarrhea, nausea and vomiting.  Genitourinary: Negative for dysuria.  Skin: Positive for rash.     Physical Exam Updated Vital Signs BP 129/80   Pulse 73   Temp 98.3 F (36.8 C) (Oral)   Resp 16   Ht 5' 3.5" (1.613 m)   Wt 76.2 kg   SpO2 98%   BMI 29.29 kg/m   Physical Exam Constitutional:      General: She is not in acute distress.    Appearance: Normal appearance.  HENT:     Head: Normocephalic and atraumatic.     Nose: Nose normal. No congestion or rhinorrhea.     Mouth/Throat:     Mouth: Mucous membranes are moist.     Pharynx: Oropharynx is clear. No oropharyngeal exudate or posterior oropharyngeal erythema.  Eyes:     General:        Right eye: No discharge.        Left eye: No discharge.     Extraocular Movements: Extraocular movements intact.     Conjunctiva/sclera: Conjunctivae normal.     Pupils: Pupils are  equal, round, and reactive to light.  Cardiovascular:     Rate and Rhythm: Normal rate and regular rhythm.     Pulses: Normal pulses.     Heart sounds: No murmur.  Pulmonary:     Effort: Pulmonary effort is normal. No respiratory distress.     Breath sounds: Normal breath sounds.  Abdominal:     General: Abdomen is flat. There is no distension.     Tenderness: There is no abdominal tenderness.  Musculoskeletal:        General: No swelling or tenderness.     Right lower leg: No edema.     Left lower leg: No edema.  Skin:    General: Skin is warm and dry.     Findings: Rash present.     Comments: Wheal covering most of the patient's back: See picture below  Neurological:     General: No focal deficit present.     Mental Status: She is alert and oriented to person, place, and time.  Psychiatric:        Mood and Affect: Mood normal.        Behavior: Behavior normal.        ED Treatments / Results  Labs (all labs ordered are listed, but only abnormal results are displayed) Labs Reviewed - No data to display  EKG None  Radiology No results found.  Procedures Procedures (including critical care time)  Medications Ordered in ED Medications  clopidogrel (PLAVIX) tablet 150 mg (150 mg Oral Given 10/12/18 1043)     Initial Impression / Assessment and Plan / ED Course  I have reviewed the triage vital signs and the nursing notes.  Pertinent labs & imaging results that were available during my care of the patient were reviewed by me and considered in my medical decision making (see chart for details).  Patient is a 63 year old female who recently was discharged from the hospital for an NSTEMI that required stent placement.  Patient  was started on Brilinta, which she first took approximately 5 days ago.  She started noticing increasing pruritus, and yesterday noticed a large rash on her back.  She denies any other symptoms besides pruritus and skin rash.  On exam patient is a  large pink confluent maculopapular rash on her back with a small area on her left bicep.  No mucosal involvement.  After discussion with cardiologist on-call it was decided to switch the patient to clopidogrel and then have her follow-up outpatient with them.  Patient was given 150 mg clopidogrel in the ED and sent home with a prescription for 75 mg daily.  Patient was advised to come back if she showed any signs of anaphylaxis or worsening rash on the clopidogrel.  Final Clinical Impressions(s) / ED Diagnoses   Final diagnoses:  Allergic reaction, initial encounter    ED Discharge Orders         Ordered    clopidogrel (PLAVIX) 75 MG tablet  Daily     10/12/18 1106           Benay Pike, MD 10/12/18 Kristian Covey    Gareth Morgan, MD 10/14/18 1137

## 2018-10-12 NOTE — ED Triage Notes (Signed)
Pt was started on Brilinta on Monday; noticed a rash/hives to back yesterday; sts felt itchy about an hour after taking last few doses

## 2018-10-12 NOTE — Discharge Instructions (Signed)
After talking with the cardiologist we have decided to switch you from Brilinta to Plavix.  We are giving you on your 50 mg of Plavix today, and a prescription to take 75 mg daily.  Please call your cardiologist tomorrow morning to see if they like to schedule a sooner follow-up than the Thursday appointment.  Otherwise, you should stop taking your Brilinta and take Plavix instead.  Take Benadryl over-the-counter medication as described on the bottle for relief of itchiness.  If you start to notice that the rash is spreading over the rest your body or if it involves your gums or lips, or eyes this reason, to the emergency department.  If you start develop difficulty breathing or feel your throat tightening this would also be reason to come back to the emergency department.

## 2018-10-12 NOTE — Telephone Encounter (Signed)
I returned a call to the patient. She reports that this morning she developed a new rash all over her back and neck. She states that the rash is itching and painful, but she denies any other significant symptoms. She recently had PCI and was started on ticagrelor and asks if this could be the cause.  I told her that I could not definitively evaluate the cause of her rash remotely, and we agreed that she should not stop her ticagrelor after recent PCI unless plan is made for another medication. I urged her to go to the ED or urgent care for further evaluation.

## 2018-10-13 ENCOUNTER — Telehealth (HOSPITAL_COMMUNITY): Payer: Self-pay

## 2018-10-13 NOTE — Telephone Encounter (Signed)
Pt insurance is active and benefits verified through Medicare A/B. Co-pay $0.00, DED $198.00/$179.10 met, out of pocket $0.00/$0.00 met, co-insurance 20%. No pre-authorization required. Passport, 10/13/2018 @ 4:24PM, NOM#[id number]  2ndary insurance is active and benefits verified through [employer].Marland Kitchen Co-pay $0.00, DED $0.00/$0.00 met, out of pocket $0.00/$0.00 met, co-insurance $0.00. No pre-authorization required. Passport, 10/13/2018 @ 4:27PM, QHU#[id number]  Patient will need to complete follow up appt. Once completed, patient will be contacted for scheduling upon review by the RN Navigator.

## 2018-10-15 ENCOUNTER — Ambulatory Visit (INDEPENDENT_AMBULATORY_CARE_PROVIDER_SITE_OTHER): Payer: Medicare Other | Admitting: Internal Medicine

## 2018-10-15 ENCOUNTER — Encounter: Payer: Self-pay | Admitting: Internal Medicine

## 2018-10-15 ENCOUNTER — Telehealth: Payer: Self-pay | Admitting: Physician Assistant

## 2018-10-15 ENCOUNTER — Other Ambulatory Visit: Payer: Self-pay

## 2018-10-15 DIAGNOSIS — I2510 Atherosclerotic heart disease of native coronary artery without angina pectoris: Secondary | ICD-10-CM | POA: Diagnosis not present

## 2018-10-15 DIAGNOSIS — E785 Hyperlipidemia, unspecified: Secondary | ICD-10-CM

## 2018-10-15 DIAGNOSIS — I10 Essential (primary) hypertension: Secondary | ICD-10-CM | POA: Diagnosis not present

## 2018-10-15 DIAGNOSIS — R739 Hyperglycemia, unspecified: Secondary | ICD-10-CM | POA: Insufficient documentation

## 2018-10-15 LAB — POCT GLYCOSYLATED HEMOGLOBIN (HGB A1C): Hemoglobin A1C: 5.2 % (ref 4.0–5.6)

## 2018-10-15 MED ORDER — AMLODIPINE BESYLATE 2.5 MG PO TABS: 2.5000 mg | ORAL_TABLET | Freq: Every day | ORAL | 3 refills | Status: DC

## 2018-10-15 MED ORDER — VALSARTAN-HYDROCHLOROTHIAZIDE 160-12.5 MG PO TABS: 1.0000 | ORAL_TABLET | Freq: Every day | ORAL | 3 refills | Status: DC

## 2018-10-15 MED ORDER — ESOMEPRAZOLE MAGNESIUM 40 MG PO CPDR: 40.0000 mg | DELAYED_RELEASE_CAPSULE | Freq: Every day | ORAL | 3 refills | Status: DC

## 2018-10-15 MED ORDER — ATORVASTATIN CALCIUM 10 MG PO TABS: ORAL_TABLET | ORAL | 3 refills | Status: DC

## 2018-10-15 NOTE — Progress Notes (Signed)
Subjective:    Patient ID: Heather Merritt, female    DOB: Mar 19, 1956, 63 y.o.   MRN: [id number]  HPI  Here to f/u; overall doing ok,  Pt denies chest pain, increasing sob or doe, wheezing, orthopnea, PND, increased LE swelling, palpitations, dizziness or syncope.  Pt denies new neurological symptoms such as new headache, or facial or extremity weakness or numbness.  Pt denies polydipsia, polyuria, or low sugar episode.  Pt states overall good compliance with meds, mostly trying to follow appropriate diet, with wt overall stable,  but little exercise however.  Just recently with rash to torso after starting brillinta, and now switched to plavix per Dr Gwenlyn Found. LDL has been 112 recently, now willing to take lipitor 10 daily from qod.  Has early cataract to left eye, for surgury maybe next year.   No other new complaints Past Medical History:  Diagnosis Date  . Allergy    SEASONAL  . ANXIETY 10/28/2007  . Arthritis   . CAD (coronary artery disease), native coronary artery    a. Cath 10/06/2018 - S/p DES to [facility]; medical therapy for high grade ostial/pro 2nd digonal   . Cataracts, bilateral    immature  . Chronic back pain   . DIVERTICULOSIS, COLON 10/28/2007  . Dry skin    red patch on right knee area  . GERD 10/28/2007   takes Prevacid daily  . H/O hiatal hernia   . HLD (hyperlipidemia) 12/12/2017  . Hx of colonic polyps   . HYPERLIPIDEMIA 10/28/2007   unable to take meds d/t allergies  . HYPERTENSION 10/28/2007   takes Diovan daily  . Internal hemorrhoids   . Irritable bowel syndrome (IBS)   . Joint pain   . Joint swelling   . LIVER FUNCTION TESTS, ABNORMAL, HX OF 10/29/2007  . Lumbar disc disease   . Migraine   . Myocardial infarction ([place])   . Obesity   . OSTEOPENIA 10/29/2007  . Osteoporosis   . Presbyacusis 01/04/2010  . Rosacea   . Seasonal allergies    takes Clarinex daily  . Vertigo    hx of  . Vitamin D deficiency    takes Vit D daily   Past Surgical History:   Procedure Laterality Date  . ANTERIOR CERVICAL DECOMP/DISCECTOMY FUSION  08/02/2011   Procedure: ANTERIOR CERVICAL DECOMPRESSION/DISCECTOMY FUSION 3 LEVELS;  Surgeon: Elaina Hoops, MD;  Location: [facility];  Service: Neurosurgery;  Laterality: N/A;  Cervical Three-Four, Cervical Four-Five, Cervical Five-Six  Anterior Cervical Decompression Fusion   . COLONOSCOPY  2001/2013  . CORONARY STENT INTERVENTION N/A 10/06/2018   Procedure: CORONARY STENT INTERVENTION;  Surgeon: Lorretta Harp, MD;  Location: [facility];  Service: Cardiovascular;  Laterality: N/A;  . ESOPHAGOGASTRODUODENOSCOPY    . LEFT HEART CATH AND CORONARY ANGIOGRAPHY N/A 10/06/2018   Procedure: LEFT HEART CATH AND CORONARY ANGIOGRAPHY;  Surgeon: Lorretta Harp, MD;  Location: [facility];  Service: Cardiovascular;  Laterality: N/A;  . POLYPECTOMY    . TONSILLECTOMY    . TONSILLECTOMY  at age 69    reports that she has quit smoking. She has never used smokeless tobacco. She reports current alcohol use. She reports that she does not use drugs. family history includes Atrial fibrillation in her father and mother; Diabetes in her maternal grandmother; Heart disease in her mother; Hyperlipidemia in her mother; Hypothyroidism in her maternal grandmother and sister; Stroke in her mother. Allergies  Allergen Reactions  . Penicillins Hives, Itching  and Rash    Did it involve swelling of the face/tongue/throat, SOB, or low BP? Unknown Did it involve sudden or severe rash/hives, skin peeling, or any reaction on the inside of your mouth or nose? Unknown Did you need to seek medical attention at a hospital or doctor's office? Unknown When did it last happen?pt cant recall If all above answers are "NO", may proceed with cephalosporin use.   . Rosuvastatin Other (See Comments)    REACTION: muscle weakness  . Simvastatin Other (See Comments)    REACTION: muscle weakness  . Statins Other (See Comments)    Myalgias and  weakness  . Sulfonamide Derivatives Other (See Comments)    Nausea, vomiting, abd pain  . Brilinta [Ticagrelor] Rash    Itchy rash to torso  . Sulfa Antibiotics Other (See Comments)    Nausea, vomiting, abd pain   Current Outpatient Medications on File Prior to Visit  Medication Sig Dispense Refill  . aspirin 81 MG tablet Take 81 mg by mouth daily.      . Calcium Citrate (CITRACAL PO) Take 2 tablets by mouth daily.     . Cholecalciferol (VITAMIN D) 2000 UNITS CAPS Take 2,000 Units by mouth daily.    . clopidogrel (PLAVIX) 75 MG tablet Take 1 tablet (75 mg total) by mouth daily. 30 tablet 0  . fish oil-omega-3 fatty acids 1000 MG capsule Take 1 g by mouth daily.     . metroNIDAZOLE (METROGEL) 0.75 % gel Apply 1 application topically at bedtime. 45 g 0  . Multiple Vitamin (MULITIVITAMIN WITH MINERALS) TABS Take 1 tablet by mouth daily.    . nitroGLYCERIN (NITROSTAT) 0.4 MG SL tablet Place 1 tablet (0.4 mg total) under the tongue every 5 (five) minutes x 3 doses as needed for chest pain. 25 tablet 12  . pimecrolimus (ELIDEL) 1 % cream Apply 1 application topically every morning. 30 g 0  . vitamin E 400 UNIT capsule Take 400 Units by mouth daily.     No current facility-administered medications on file prior to visit.    Review of Systems  Constitutional: Negative for other unusual diaphoresis or sweats HENT: Negative for ear discharge or swelling Eyes: Negative for other worsening visual disturbances Respiratory: Negative for stridor or other swelling  Gastrointestinal: Negative for worsening distension or other blood Genitourinary: Negative for retention or other urinary change Musculoskeletal: Negative for other MSK pain or swelling Skin: Negative for color change or other new lesions Neurological: Negative for worsening tremors and other numbness  Psychiatric/Behavioral: Negative for worsening agitation or other fatigue All other system neg per pt    Objective:   Physical Exam  BP 124/78   Pulse 68   Temp 98.4 F (36.9 C) (Oral)   Ht 5' 3.5" (1.613 m)   Wt 165 lb (74.8 kg)   SpO2 97%   BMI 28.77 kg/m  VS noted,  Constitutional: Pt appears in NAD HENT: Head: NCAT.  Right Ear: External ear normal.  Left Ear: External ear normal.  Eyes: . Pupils are equal, round, and reactive to light. Conjunctivae and EOM are normal Nose: without d/c or deformity Neck: Neck supple. Gross normal ROM Cardiovascular: Normal rate and regular rhythm.   Pulmonary/Chest: Effort normal and breath sounds without rales or wheezing.  Abd:  Soft, NT, ND, + BS, no organomegaly Neurological: Pt is alert. At baseline orientation, motor grossly intact Skin: Skin is warm. No rashes, other new lesions, no LE edema Psychiatric: Pt behavior is normal without agitation  No other exam findings  Lab Results  Component Value Date   WBC 4.9 10/07/2018   HGB 12.2 10/07/2018   HCT 36.5 10/07/2018   PLT 213 10/07/2018   GLUCOSE 111 (H) 10/07/2018   CHOL 204 (H) 10/04/2018   TRIG 280 (H) 10/04/2018   HDL 36 (L) 10/04/2018   LDLDIRECT 177.0 06/05/2017   LDLCALC 112 (H) 10/04/2018   ALT 26 10/03/2018   AST 24 10/03/2018   NA 141 10/07/2018   K 4.1 10/07/2018   CL 105 10/07/2018   CREATININE 0.79 10/07/2018   BUN 13 10/07/2018   CO2 28 10/07/2018   TSH 1.86 06/05/2017   INR 0.9 10/03/2018    POCT glycosylated hemoglobin (Hb A1C) Order: [id number] Status:  Final result Visible to patient:  No (not released) Dx:  Hyperglycemia  Ref Range & Units 09:14  Hemoglobin A1C 4.0 - 5.6 % 5.2           Assessment & Plan:

## 2018-10-15 NOTE — Telephone Encounter (Signed)
LVM, reminding  Pt of her appt with Almyra Deforest on 10-16-18.

## 2018-10-15 NOTE — Assessment & Plan Note (Signed)
stable overall by history and exam, recent data reviewed with pt, and pt to continue medical treatment as before,  to f/u any worsening symptoms or concerns le  

## 2018-10-15 NOTE — Assessment & Plan Note (Signed)
Ok to change the lipitor to daily as she is now willing, goal ldl < 70

## 2018-10-15 NOTE — Patient Instructions (Addendum)
Please take all new medication as prescribed - the nexium  OK to increase the lipitor to 10 mg daily  Please continue all other medications as before, and refills have been done if requested.  Please have the pharmacy call with any other refills you may need.  Please continue your efforts at being more active, low cholesterol diet, and weight control.  You are otherwise up to date with prevention measures today.  Please keep your appointments with your specialists as you may have planned  Your A1c was OK today  Please return in 6 months, or sooner if needed

## 2018-10-15 NOTE — Assessment & Plan Note (Signed)
Very mild, for poct a1c today, suspect likely to be normal

## 2018-10-16 ENCOUNTER — Ambulatory Visit (INDEPENDENT_AMBULATORY_CARE_PROVIDER_SITE_OTHER): Payer: Medicare Other | Admitting: Physician Assistant

## 2018-10-16 ENCOUNTER — Encounter: Payer: Self-pay | Admitting: Physician Assistant

## 2018-10-16 VITALS — BP 108/68 | HR 73 | Temp 95.9°F | Ht 63.5 in | Wt 167.0 lb

## 2018-10-16 DIAGNOSIS — E785 Hyperlipidemia, unspecified: Secondary | ICD-10-CM

## 2018-10-16 DIAGNOSIS — I10 Essential (primary) hypertension: Secondary | ICD-10-CM | POA: Diagnosis not present

## 2018-10-16 DIAGNOSIS — I2510 Atherosclerotic heart disease of native coronary artery without angina pectoris: Secondary | ICD-10-CM | POA: Diagnosis not present

## 2018-10-16 MED ORDER — METOPROLOL TARTRATE 25 MG PO TABS: 12.5000 mg | ORAL_TABLET | Freq: Two times a day (BID) | ORAL | 0 refills | Status: DC

## 2018-10-16 NOTE — Progress Notes (Signed)
Cardiology Office Note    Date:  10/18/2018   ID:  Heather Merritt, DOB [birth year], MRN [id number]  PCP:  Biagio Borg, MD  Cardiologist:  Dr. Gwenlyn Found   Chief Complaint  Patient presents with  . Follow-up    History of Present Illness:  Heather Merritt is a 63 y.o. female with past medical history of hypertension, hyperlipidemia, hiatal hernia, anxiety GERD and recently diagnosed CAD.  Patient presented to the hospital on 10/30/2018 with chest pain.  He was ruled in for NSTEMI by troponin.  Coronary CT obtained on 7/4 showed severe stenosis in mid RCA.  Echocardiogram obtained on 10/06/2018 showed EF 55 to 60%.  Cardiac catheterization performed on the same day showed 90% second diagonal lesion, 95% proximal RCA lesion treated with a 2.5 x 20 mm Synergy DES, EF 55 to 65%.  The diagonal artery was fairly small and was treated medically.  Postprocedure, patient was placed on aspirin and Brilinta and he recommended to continue the medication for at least 12 months.  Lipid panel obtained during this admission showed LDL of 112.  She is only on Lipitor 10 mg daily due to statin intolerance.  She will be referred to the lipid clinic for consideration of PCSK9 inhibitor as outpatient.  Since discharge, she returned to the hospital ED on 7/12 due to allergic reaction with rash and itching after starting Brilinta.  She was switched to Plavix after 150 mg loading dose.  Patient presents today for cardiology office visit.  She denies any recent chest pain.  She did have a some pounding sensation of her chest yesterday, however she attributed to over exerting herself and the episode only lasted about a second each time.  She was able to exercise up to 30 minutes this morning without any issue.  Otherwise she has been compliant with her dual antiplatelet therapy.  At the recommendation of the discharge note, I did discontinue her amlodipine and switched her to metoprolol tartrate 12.5 mg twice daily.  I have  instructed her to contact us if she has excessive fatigue or dizziness spell after starting on this medication.  Otherwise she has no lower extremity edema, orthopnea or PND.   Past Medical History:  Diagnosis Date  . Allergy    SEASONAL  . ANXIETY 10/28/2007  . Arthritis   . CAD (coronary artery disease), native coronary artery    a. Cath 10/06/2018 - S/p DES to [facility]; medical therapy for high grade ostial/pro 2nd digonal   . Cataracts, bilateral    immature  . Chronic back pain   . DIVERTICULOSIS, COLON 10/28/2007  . Dry skin    red patch on right knee area  . GERD 10/28/2007   takes Prevacid daily  . H/O hiatal hernia   . HLD (hyperlipidemia) 12/12/2017  . Hx of colonic polyps   . HYPERLIPIDEMIA 10/28/2007   unable to take meds d/t allergies  . HYPERTENSION 10/28/2007   takes Diovan daily  . Internal hemorrhoids   . Irritable bowel syndrome (IBS)   . Joint pain   . Joint swelling   . LIVER FUNCTION TESTS, ABNORMAL, HX OF 10/29/2007  . Lumbar disc disease   . Migraine   . Myocardial infarction ([place])   . Obesity   . OSTEOPENIA 10/29/2007  . Osteoporosis   . Presbyacusis 01/04/2010  . Rosacea   . Seasonal allergies    takes Clarinex daily  . Vertigo    hx of  . Vitamin D deficiency  takes Vit D daily    Past Surgical History:  Procedure Laterality Date  . ANTERIOR CERVICAL DECOMP/DISCECTOMY FUSION  08/02/2011   Procedure: ANTERIOR CERVICAL DECOMPRESSION/DISCECTOMY FUSION 3 LEVELS;  Surgeon: Elaina Hoops, MD;  Location: [place] [facility];  Service: Neurosurgery;  Laterality: N/A;  Cervical Three-Four, Cervical Four-Five, Cervical Five-Six  Anterior Cervical Decompression Fusion   . COLONOSCOPY  2001/2013  . CORONARY STENT INTERVENTION N/A 10/06/2018   Procedure: CORONARY STENT INTERVENTION;  Surgeon: Lorretta Harp, MD;  Location: [place] [facility];  Service: Cardiovascular;  Laterality: N/A;  . ESOPHAGOGASTRODUODENOSCOPY    . LEFT HEART CATH AND CORONARY ANGIOGRAPHY N/A  10/06/2018   Procedure: LEFT HEART CATH AND CORONARY ANGIOGRAPHY;  Surgeon: Lorretta Harp, MD;  Location: [place] [facility];  Service: Cardiovascular;  Laterality: N/A;  . POLYPECTOMY    . TONSILLECTOMY    . TONSILLECTOMY  at age 49    Current Medications: Outpatient Medications Prior to Visit  Medication Sig Dispense Refill  . aspirin 81 MG tablet Take 81 mg by mouth daily.      Marland Kitchen atorvastatin (LIPITOR) 10 MG tablet TAKE 1 TABLET BY MOUTH  EVERY OTHER DAY 90 tablet 3  . Calcium Citrate (CITRACAL PO) Take 2 tablets by mouth daily.     . Cholecalciferol (VITAMIN D) 2000 UNITS CAPS Take 2,000 Units by mouth daily.    . clopidogrel (PLAVIX) 75 MG tablet Take 1 tablet (75 mg total) by mouth daily. 30 tablet 0  . esomeprazole (NEXIUM) 40 MG capsule Take 1 capsule (40 mg total) by mouth daily. 90 capsule 3  . fish oil-omega-3 fatty acids 1000 MG capsule Take 1 g by mouth daily.     . metroNIDAZOLE (METROGEL) 0.75 % gel Apply 1 application topically at bedtime. 45 g 0  . Multiple Vitamin (MULITIVITAMIN WITH MINERALS) TABS Take 1 tablet by mouth daily.    . nitroGLYCERIN (NITROSTAT) 0.4 MG SL tablet Place 1 tablet (0.4 mg total) under the tongue every 5 (five) minutes x 3 doses as needed for chest pain. 25 tablet 12  . pimecrolimus (ELIDEL) 1 % cream Apply 1 application topically every morning. 30 g 0  . valsartan-hydrochlorothiazide (DIOVAN-HCT) 160-12.5 MG tablet Take 1 tablet by mouth daily. 90 tablet 3  . vitamin E 400 UNIT capsule Take 400 Units by mouth daily.    Marland Kitchen amLODipine (NORVASC) 2.5 MG tablet Take 1 tablet (2.5 mg total) by mouth daily. 90 tablet 3   No facility-administered medications prior to visit.      Allergies:   Penicillins, Rosuvastatin, Simvastatin, Statins, Sulfonamide derivatives, Brilinta [ticagrelor], and Sulfa antibiotics   Social History   Socioeconomic History  . Marital status: Divorced    Spouse name: Not on file  . Number of children: Not on file  .  Years of education: Not on file  . Highest education level: Not on file  Occupational History  . Not on file  Social Needs  . Financial resource strain: Not on file  . Food insecurity    Worry: Not on file    Inability: Not on file  . Transportation needs    Medical: Not on file    Non-medical: Not on file  Tobacco Use  . Smoking status: Former Research scientist (life sciences)  . Smokeless tobacco: Never Used  . Tobacco comment: quit in 52yrs ago  Substance and Sexual Activity  . Alcohol use: Yes    Alcohol/week: 0.0 standard drinks    Comment: occ beer/drink  .  Drug use: No  . Sexual activity: Yes    Birth control/protection: Post-menopausal  Lifestyle  . Physical activity    Days per week: Not on file    Minutes per session: Not on file  . Stress: Not on file  Relationships  . Social Herbalist on phone: Not on file    Gets together: Not on file    Attends religious service: Not on file    Active member of club or organization: Not on file    Attends meetings of clubs or organizations: Not on file    Relationship status: Not on file  Other Topics Concern  . Not on file  Social History Narrative  . Not on file     Family History:  The patient's family history includes Atrial fibrillation in her father and mother; Diabetes in her maternal grandmother; Heart disease in her mother; Hyperlipidemia in her mother; Hypothyroidism in her maternal grandmother and sister; Stroke in her mother.   ROS:   Please see the history of present illness.    ROS All other systems reviewed and are negative.   PHYSICAL EXAM:   VS:  BP 108/68   Pulse 73   Temp (!) 95.9 F (35.5 C)   Ht 5' 3.5" (1.613 m)   Wt 167 lb (75.8 kg)   SpO2 98%   BMI 29.12 kg/m    GEN: Well nourished, well developed, in no acute distress  HEENT: normal  Neck: no JVD, carotid bruits, or masses Cardiac: RRR; no murmurs, rubs, or gallops,no edema  Respiratory:  clear to auscultation bilaterally, normal work of breathing  GI: soft, nontender, nondistended, + BS MS: no deformity or atrophy  Skin: warm and dry, no rash Neuro:  Alert and Oriented x 3, Strength and sensation are intact Psych: euthymic mood, full affect  Wt Readings from Last 3 Encounters:  10/16/18 167 lb (75.8 kg)  10/15/18 165 lb (74.8 kg)  10/12/18 168 lb (76.2 kg)      Studies/Labs Reviewed:   EKG:  EKG is ordered today.  The ekg ordered today demonstrates normal sinus rhythm with poor R wave progression anterior leads, otherwise no significant ST-T wave changes.  Recent Labs: 10/03/2018: ALT 26 10/07/2018: BUN 13; Creatinine, Ser 0.79; Hemoglobin 12.2; Platelets 213; Potassium 4.1; Sodium 141   Lipid Panel    Component Value Date/Time   CHOL 204 (H) 10/04/2018 0312   TRIG 280 (H) 10/04/2018 0312   HDL 36 (L) 10/04/2018 0312   CHOLHDL 5.7 10/04/2018 0312   VLDL 56 (H) 10/04/2018 0312   LDLCALC 112 (H) 10/04/2018 0312   LDLDIRECT 177.0 06/05/2017 1301    Additional studies/ records that were reviewed today include:   Echo 10/06/2018 1. The left ventricle has normal systolic function, with an ejection fraction of 55-60%. The cavity size was normal. Left ventricular diastolic parameters were normal. No evidence of left ventricular regional wall motion abnormalities.  2. The right ventricle has normal systolic function. The cavity was normal. There is no increase in right ventricular wall thickness. Right ventricular systolic pressure could not be assessed.  3. No evidence of mitral valve stenosis.  4. The aortic valve is tricuspid. No stenosis of the aortic valve.  5. The aortic root and aortic arch are normal in size and structure.  Cath 10/06/2018  2nd Diag lesion is 90% stenosed.  Prox RCA to Mid RCA lesion is 95% stenosed.  Mid RCA lesion is 30% stenosed.  A drug-eluting stent  was successfully placed using a STENT SYNERGY DES 2.5X20.  Post intervention, there is a 0% residual stenosis.  The left ventricular systolic  function is normal.  LV end diastolic pressure is normal.  The left ventricular ejection fraction is 55-65% by visual estimate.  ASSESSMENT:    1. Coronary artery disease involving native coronary artery of native heart without angina pectoris   2. Hyperlipidemia, unspecified hyperlipidemia type   3. Essential hypertension      PLAN:  In order of problems listed above:  1. CAD: Recently placed DES to second diagonal.  Continue aspirin and Brilinta.  Emphasis has been placed on compliance with dual antiplatelet therapy.  2. Hyperlipidemia: Due to history of statin intolerance, she is unable to tolerate higher dose of statin.  I have referred her to the lipid clinic for consideration of PCSK9 inhibitor.  3. Hypertension: Heart rate is stable.  I switched her from amlodipine to metoprolol   Medication Adjustments/Labs and Tests Ordered: Current medicines are reviewed at length with the patient today.  Concerns regarding medicines are outlined above.  Medication changes, Labs and Tests ordered today are listed in the Patient Instructions below. Patient Instructions  Medication Instructions:   STOP AMLODIPINE   START METOPROLOL TARTRATE 12.5 MG 2 TIMES A DAY  If you need a refill on your cardiac medications before your next appointment, please call your pharmacy.   Lab work: NONE ordered at this time of appointment   If you have labs (blood work) drawn today and your tests are completely normal, you will receive your results only by: Marland Kitchen MyChart Message (if you have MyChart) OR . A paper copy in the mail If you have any lab test that is abnormal or we need to change your treatment, we will call you to review the results.  Testing/Procedures: NONE ordered at this time of appointment   Follow-Up: At [facility], you and your health needs are our priority.  As part of our continuing mission to provide you with exceptional heart care, we have created designated Provider Care  Teams.  These Care Teams include your primary Cardiologist (physician) and Advanced Practice Providers (APPs -  Physician Assistants and Nurse Practitioners) who all work together to provide you with the care you need, when you need it. You will need a follow up appointment in 3 months.  Please call our office 2 months in advance to schedule this appointment.  You may see Quay Burow, MD or one of the following Advanced Practice Providers on your designated Care Team:   Kerin Ransom, PA-C Roby Lofts, [place] . Sande Rives, PA-C  Any Other Special Instructions Will Be Listed Below (If Applicable).       Hilbert Corrigan, [place]  10/18/2018 11:15 PM    [facility] [place], [place], [place]  [address] Phone: [phone]; Fax: [phone]

## 2018-10-16 NOTE — Patient Instructions (Signed)
Medication Instructions:   STOP AMLODIPINE   START METOPROLOL TARTRATE 12.5 MG 2 TIMES A DAY  If you need a refill on your cardiac medications before your next appointment, please call your pharmacy.   Lab work: NONE ordered at this time of appointment   If you have labs (blood work) drawn today and your tests are completely normal, you will receive your results only by: Marland Kitchen MyChart Message (if you have MyChart) OR . A paper copy in the mail If you have any lab test that is abnormal or we need to change your treatment, we will call you to review the results.  Testing/Procedures: NONE ordered at this time of appointment   Follow-Up: At [facility], you and your health needs are our priority.  As part of our continuing mission to provide you with exceptional heart care, we have created designated Provider Care Teams.  These Care Teams include your primary Cardiologist (physician) and Advanced Practice Providers (APPs -  Physician Assistants and Nurse Practitioners) who all work together to provide you with the care you need, when you need it. You will need a follow up appointment in 3 months.  Please call our office 2 months in advance to schedule this appointment.  You may see Quay Burow, MD or one of the following Advanced Practice Providers on your designated Care Team:   Kerin Ransom, PA-C Roby Lofts, [place] . Sande Rives, PA-C  Any Other Special Instructions Will Be Listed Below (If Applicable).

## 2018-10-19 ENCOUNTER — Encounter: Payer: Self-pay | Admitting: Internal Medicine

## 2018-10-22 ENCOUNTER — Telehealth: Payer: Self-pay | Admitting: Cardiovascular Disease

## 2018-10-22 ENCOUNTER — Other Ambulatory Visit: Payer: Self-pay | Admitting: Cardiovascular Disease

## 2018-10-22 NOTE — Telephone Encounter (Signed)
New Message    *STAT* If patient is at the pharmacy, call can be transferred to refill team.   1. Which medications need to be refilled? (please list name of each medication and dose if known) metoprolol tartrate (LOPRESSOR) 25 MG tablet  And clopidogrel (PLAVIX) 75 MG tablet   2. Which pharmacy/location (including street and city if local pharmacy) is medication to be sent to? [place], [place]  3. Do they need a 30 day or 90 day supply? 90  Patient needs an rx sent to mail order pharmacy

## 2018-10-22 NOTE — Telephone Encounter (Signed)
New Message       [facility] Pre-operative Risk Assessment    Request for surgical clearance:  1. What type of surgery is being performed? Teeth Cleaning (routine)  2. When is this surgery scheduled? 10/29/2018   3. What type of clearance is required (medical clearance vs. Pharmacy clearance to hold med vs. Both)? Both  4. Are there any medications that need to be held prior to surgery and how long? Plavix cardiologist recommendation   5. Practice name and name of physician performing surgery? [facility]  Dr. Luvenia Heller Ribando  6. What is your office phone number? [phone]    7.   What is your office fax number [phone]  8.   Anesthesia type (None, local, MAC, general) ? none   Avaletta L Williams 10/22/2018, 3:50 PM  _________________________________________________________________   (provider comments below)

## 2018-10-22 NOTE — Telephone Encounter (Signed)
   Primary Cardiologist: Quay Burow, MD  Chart reviewed as part of pre-operative protocol coverage. Simple dental extractions and cleaning are considered low risk procedures per guidelines and generally do not require any specific cardiac clearance. It is also generally accepted that for simple extractions and dental cleanings, there is no need to interrupt blood thinner therapy. Pt had recently had stent placement 10/06/2018 and therefore it is NOT recommended to interrupt Plavix therapy at this time.   SBE prophylaxis is not required for the patient.  I will route this recommendation to the requesting party via Epic fax function and remove from pre-op pool.  Please call with questions.  Kathyrn Drown, NP 10/22/2018, 4:22 PM

## 2018-10-24 MED ORDER — CLOPIDOGREL BISULFATE 75 MG PO TABS: 75.0000 mg | ORAL_TABLET | Freq: Every day | ORAL | 3 refills | Status: DC

## 2018-10-24 MED ORDER — METOPROLOL TARTRATE 25 MG PO TABS: 12.5000 mg | ORAL_TABLET | Freq: Two times a day (BID) | ORAL | 3 refills | Status: DC

## 2018-10-24 NOTE — Telephone Encounter (Signed)
Rx(s) sent to pharmacy electronically.  

## 2018-10-30 ENCOUNTER — Telehealth (HOSPITAL_COMMUNITY): Payer: Self-pay

## 2018-11-08 ENCOUNTER — Other Ambulatory Visit: Payer: Self-pay | Admitting: Physician Assistant

## 2018-11-10 ENCOUNTER — Telehealth (HOSPITAL_COMMUNITY): Payer: Self-pay

## 2018-11-10 NOTE — Telephone Encounter (Signed)
Called patient to see if she was interested in participating in the Cardiac Rehab Program. Patient stated yes. Patient will come in for orientation on 12/30/2018 and will attend the 7:00am exercise class.  Mailed homework package.

## 2018-12-09 ENCOUNTER — Telehealth (HOSPITAL_COMMUNITY): Payer: Self-pay | Admitting: Internal Medicine

## 2018-12-24 ENCOUNTER — Telehealth (HOSPITAL_COMMUNITY): Payer: Self-pay | Admitting: Pharmacist

## 2018-12-24 NOTE — Telephone Encounter (Signed)
Cardiac Rehab Medication Review by a Pharmacist  Does the patient feel that his/her medications are working for him/her?  yes  Has the patient been experiencing any side effects to the medications prescribed?  no  Does the patient measure his/her own blood pressure or blood glucose at home?  yes - occasionally checks with personal BP cuff but not everyday  Does the patient have any problems obtaining medications due to transportation or finances?   no  Understanding of regimen: good Understanding of indications: good Potential of compliance: good  Richardine Service, PharmD PGY1 Pharmacy Resident Phone: [phone] 12/24/2018  4:54 PM

## 2018-12-30 ENCOUNTER — Encounter (HOSPITAL_COMMUNITY)
Admission: RE | Admit: 2018-12-30 | Discharge: 2018-12-30 | Disposition: A | Discharge status: Home / Self Care | Payer: Medicare Other | Source: Ambulatory Visit | Attending: Cardiovascular Disease | Admitting: Cardiovascular Disease

## 2018-12-30 ENCOUNTER — Encounter (HOSPITAL_COMMUNITY): Payer: Self-pay

## 2018-12-30 ENCOUNTER — Other Ambulatory Visit: Payer: Self-pay

## 2018-12-30 VITALS — BP 126/74 | HR 70 | Temp 99.7°F | Ht 63.5 in | Wt 167.5 lb

## 2018-12-30 DIAGNOSIS — I214 Non-ST elevation (NSTEMI) myocardial infarction: Secondary | ICD-10-CM

## 2018-12-30 DIAGNOSIS — Z955 Presence of coronary angioplasty implant and graft: Secondary | ICD-10-CM

## 2018-12-30 HISTORY — DX: Atherosclerotic heart disease of native coronary artery without angina pectoris: I25.10

## 2018-12-30 NOTE — Progress Notes (Signed)
Cardiac Individual Treatment Plan  Patient Details  Name: Heather Merritt MRN: [id number] Date of Birth: 21-Feb-1956 Referring Provider:     [facility] from 12/30/2018 in [facility]  Referring Provider  Quay Burow, MD      Initial Encounter Date:    CARDIAC REHAB PHASE II ORIENTATION from 12/30/2018 in [facility]  Date  12/30/18      Visit Diagnosis: 10/03/18 NSTEMI  10/06/18 DES RCA  Patient's Home Medications on Admission:  Current Outpatient Medications:  .  aspirin 81 MG tablet, Take 81 mg by mouth daily.  , Disp: , Rfl:  .  atorvastatin (LIPITOR) 10 MG tablet, TAKE 1 TABLET BY MOUTH  EVERY OTHER DAY, Disp: 90 tablet, Rfl: 3 .  Calcium Citrate (CITRACAL PO), Take 2 tablets by mouth daily. , Disp: , Rfl:  .  Cholecalciferol (VITAMIN D) 2000 UNITS CAPS, Take 2,000 Units by mouth daily., Disp: , Rfl:  .  clopidogrel (PLAVIX) 75 MG tablet, Take 1 tablet (75 mg total) by mouth daily., Disp: 90 tablet, Rfl: 3 .  esomeprazole (NEXIUM) 40 MG capsule, Take 1 capsule (40 mg total) by mouth daily., Disp: 90 capsule, Rfl: 3 .  fish oil-omega-3 fatty acids 1000 MG capsule, Take 1 g by mouth daily. , Disp: , Rfl:  .  metoprolol tartrate (LOPRESSOR) 25 MG tablet, TAKE 0.5 TABLETS (12.5 MG TOTAL) BY MOUTH 2 (TWO) TIMES DAILY., Disp: 30 tablet, Rfl: 1 .  metroNIDAZOLE (METROGEL) 0.75 % gel, Apply 1 application topically at bedtime., Disp: 45 g, Rfl: 0 .  Multiple Vitamin (MULITIVITAMIN WITH MINERALS) TABS, Take 1 tablet by mouth daily., Disp: , Rfl:  .  nitroGLYCERIN (NITROSTAT) 0.4 MG SL tablet, Place 1 tablet (0.4 mg total) under the tongue every 5 (five) minutes x 3 doses as needed for chest pain., Disp: 25 tablet, Rfl: 12 .  pimecrolimus (ELIDEL) 1 % cream, Apply 1 application topically every morning., Disp: 30 g, Rfl: 0 .  valsartan-hydrochlorothiazide (DIOVAN-HCT) 160-12.5 MG tablet, Take 1 tablet by  mouth daily., Disp: 90 tablet, Rfl: 3 .  vitamin E 400 UNIT capsule, Take 400 Units by mouth daily., Disp: , Rfl:   Past Medical History: Past Medical History:  Diagnosis Date  . Allergy    SEASONAL  . ANXIETY 10/28/2007  . Arthritis   . CAD (coronary artery disease), native coronary artery    a. Cath 10/06/2018 - S/p DES to [facility]; medical therapy for high grade ostial/pro 2nd digonal   . Cataracts, bilateral    immature  . Chronic back pain   . Coronary artery disease   . DIVERTICULOSIS, COLON 10/28/2007  . Dry skin    red patch on right knee area  . GERD 10/28/2007   takes Prevacid daily  . H/O hiatal hernia   . HLD (hyperlipidemia) 12/12/2017  . Hx of colonic polyps   . HYPERLIPIDEMIA 10/28/2007   unable to take meds d/t allergies  . HYPERTENSION 10/28/2007   takes Diovan daily  . Internal hemorrhoids   . Irritable bowel syndrome (IBS)   . Joint pain   . Joint swelling   . LIVER FUNCTION TESTS, ABNORMAL, HX OF 10/29/2007  . Lumbar disc disease   . Migraine   . Myocardial infarction ([place])   . Obesity   . OSTEOPENIA 10/29/2007  . Osteoporosis   . Presbyacusis 01/04/2010  . Rosacea   . Seasonal allergies    takes Clarinex daily  .  Vertigo    hx of  . Vitamin D deficiency    takes Vit D daily    Tobacco Use: Social History   Tobacco Use  Smoking Status Former Smoker  Smokeless Tobacco Never Used  Tobacco Comment   quit in 18yrs ago    Labs: Recent Chemical engineer    Labs for ITP Cardiac and Pulmonary Rehab Latest Ref Rng & Units 04/26/2015 05/31/2016 06/05/2017 10/04/2018 10/15/2018   Cholestrol 0 - 200 mg/dL 255(H) 288(H) 287(H) 204(H) -   LDLCALC 0 - 99 mg/dL - - - 112(H) -   LDLDIRECT mg/dL 166.0 181.0 177.0 - -   HDL >40 mg/dL 39.90 40.40 45.40 36(L) -   Trlycerides <150 mg/dL 224.0(H) 208.0(H) 201.0(H) 280(H) -   Hemoglobin A1c 4.0 - 5.6 % - - - - 5.2      Capillary Blood Glucose: No results found for: GLUCAP   Exercise Target Goals: Exercise Program  Goal: Individual exercise prescription set using results from initial 6 min walk test and THRR while considering  patient's activity barriers and safety.   Exercise Prescription Goal: Initial exercise prescription builds to 30-45 minutes a day of aerobic activity, 2-3 days per week.  Home exercise guidelines will be given to patient during program as part of exercise prescription that the participant will acknowledge.  Activity Barriers & Risk Stratification: Activity Barriers & Cardiac Risk Stratification - 12/30/18 0921      Activity Barriers & Cardiac Risk Stratification   Activity Barriers  Neck/Spine Problems;Back Problems;Balance Concerns;Other (comment)    Comments  Patient has 2 herniated discs (L4 & L5), hx of cervical disc replacement (C3,4, & 5), pain from statin intolerance, and has previously had physical therapy for balance concerns secondary to an inner ear issue.    Cardiac Risk Stratification  High       6 Minute Walk: 6 Minute Walk    Row Name 12/30/18 0809         6 Minute Walk   Phase  Initial     Distance  1506 feet     Walk Time  6 minutes     # of Rest Breaks  0     MPH  2.85     METS  3.23     RPE  11     Perceived Dyspnea   0     VO2 Peak  11.31     Symptoms  No     Resting HR  70 bpm     Resting BP  126/74     Resting Oxygen Saturation   98 %     Exercise Oxygen Saturation  during 6 min walk  99 %     Max Ex. HR  87 bpm     Max Ex. BP  128/78     2 Minute Post BP  120/72        Oxygen Initial Assessment:   Oxygen Re-Evaluation:   Oxygen Discharge (Final Oxygen Re-Evaluation):   Initial Exercise Prescription: Initial Exercise Prescription - 12/30/18 0900      Date of Initial Exercise RX and Referring Provider   Date  12/30/18    Referring Provider  Quay Burow, MD    Expected Discharge Date  02/27/19      Recumbant Bike   Level  3    Watts  30    Minutes  15    METs  3.24      NuStep   Level  3  SPM  85    Minutes  15     METs  3.2      Prescription Details   Frequency (times per week)  3    Duration  Progress to 30 minutes of continuous aerobic without signs/symptoms of physical distress      Intensity   THRR 40-80% of Max Heartrate  62-124    Ratings of Perceived Exertion  11-13    Perceived Dyspnea  0-4      Progression   Progression  Continue to progress workloads to maintain intensity without signs/symptoms of physical distress.      Resistance Training   Training Prescription  Yes    Weight  3lbs    Reps  10-15       Perform Capillary Blood Glucose checks as needed.  Exercise Prescription Changes:   Exercise Comments:   Exercise Goals and Review:  Exercise Goals    Row Name 12/30/18 0746             Exercise Goals   Increase Physical Activity  Yes       Intervention  Provide advice, education, support and counseling about physical activity/exercise needs.;Develop an individualized exercise prescription for aerobic and resistive training based on initial evaluation findings, risk stratification, comorbidities and participant's personal goals.       Expected Outcomes  Short Term: Attend rehab on a regular basis to increase amount of physical activity.;Long Term: Exercising regularly at least 3-5 days a week.;Long Term: Add in home exercise to make exercise part of routine and to increase amount of physical activity.       Increase Strength and Stamina  Yes       Intervention  Provide advice, education, support and counseling about physical activity/exercise needs.;Develop an individualized exercise prescription for aerobic and resistive training based on initial evaluation findings, risk stratification, comorbidities and participant's personal goals.       Expected Outcomes  Short Term: Increase workloads from initial exercise prescription for resistance, speed, and METs.;Short Term: Perform resistance training exercises routinely during rehab and add in resistance training at  home;Long Term: Improve cardiorespiratory fitness, muscular endurance and strength as measured by increased METs and functional capacity (6MWT)       Able to understand and use rate of perceived exertion (RPE) scale  Yes       Intervention  Provide education and explanation on how to use RPE scale       Expected Outcomes  Short Term: Able to use RPE daily in rehab to express subjective intensity level;Long Term:  Able to use RPE to guide intensity level when exercising independently       Knowledge and understanding of Target Heart Rate Range (THRR)  Yes       Intervention  Provide education and explanation of THRR including how the numbers were predicted and where they are located for reference       Expected Outcomes  Short Term: Able to state/look up THRR;Long Term: Able to use THRR to govern intensity when exercising independently;Short Term: Able to use daily as guideline for intensity in rehab       Able to check pulse independently  Yes       Intervention  Provide education and demonstration on how to check pulse in carotid and radial arteries.;Review the importance of being able to check your own pulse for safety during independent exercise       Expected Outcomes  Short Term: Able to explain why pulse  checking is important during independent exercise;Long Term: Able to check pulse independently and accurately       Understanding of Exercise Prescription  Yes       Intervention  Provide education, explanation, and written materials on patient's individual exercise prescription       Expected Outcomes  Short Term: Able to explain program exercise prescription;Long Term: Able to explain home exercise prescription to exercise independently          Exercise Goals Re-Evaluation :   Discharge Exercise Prescription (Final Exercise Prescription Changes):   Nutrition:  Target Goals: Understanding of nutrition guidelines, daily intake of sodium 1500mg , cholesterol 200mg , calories 30% from fat  and 7% or less from saturated fats, daily to have 5 or more servings of fruits and vegetables.  Biometrics: Pre Biometrics - 12/30/18 0746      Pre Biometrics   Height  5' 3.5" (1.613 m)    Weight  76 kg    Waist Circumference  35.5 inches    Hip Circumference  43.5 inches    Waist to Hip Ratio  0.82 %    BMI (Calculated)  29.21    Triceps Skinfold  21 mm    % Body Fat  38.3 %    Grip Strength  35.5 kg    Flexibility  17.25 in    Single Leg Stand  30 seconds        Nutrition Therapy Plan and Nutrition Goals:   Nutrition Assessments:   Nutrition Goals Re-Evaluation:   Nutrition Goals Re-Evaluation:   Nutrition Goals Discharge (Final Nutrition Goals Re-Evaluation):   Psychosocial: Target Goals: Acknowledge presence or absence of significant depression and/or stress, maximize coping skills, provide positive support system. Participant is able to verbalize types and ability to use techniques and skills needed for reducing stress and depression.  Initial Review & Psychosocial Screening: Initial Psych Review & Screening - 12/30/18 0838      Initial Review   Current issues with  None Identified      Family Dynamics   Good Support System?  Yes    Comments  Patient denies psychosocial barriers to participation in CR. Patient will continue to utilize family, friends and healthcare team for support and resources. She will maintain a positive attitude and participate in healthy stress management if psychosocial barriers arise. She does admit to mild stess secondary to Covid-19 and social isolation however she is able to successfully manage her stress by talking on the phone with family and friends.      Barriers   Psychosocial barriers to participate in program  There are no identifiable barriers or psychosocial needs.      Screening Interventions   Interventions  Encouraged to exercise       Quality of Life Scores: Quality of Life - 12/30/18 0932      Quality of Life    Select  Quality of Life      Quality of Life Scores   Health/Function Pre  26.23 %    Socioeconomic Pre  29.14 %    Psych/Spiritual Pre  27.43 %    Family Pre  26 %    GLOBAL Pre  27.11 %      Scores of 19 and below usually indicate a poorer quality of life in these areas.  A difference of  2-3 points is a clinically meaningful difference.  A difference of 2-3 points in the total score of the Quality of Life Index has been associated with significant improvement  in overall quality of life, self-image, physical symptoms, and general health in studies assessing change in quality of life.  PHQ-9: Recent Review Flowsheet Data    Depression screen [facility] 2/9 12/30/2018 10/15/2018 10/15/2018 06/11/2017 06/05/2016   Decreased Interest 0 0 0 0 0   Down, Depressed, Hopeless 0 0 0 0 0   PHQ - 2 Score 0 0 0 0 0   Altered sleeping - - 0 - -   Tired, decreased energy - - 0 - -   Change in appetite - - 0 - -   Feeling bad or failure about yourself  - - 0 - -   Trouble concentrating - - 0 - -   Moving slowly or fidgety/restless - - 0 - -   Suicidal thoughts - - 0 - -   PHQ-9 Score - - 0 - -     Interpretation of Total Score  Total Score Depression Severity:  1-4 = Minimal depression, 5-9 = Mild depression, 10-14 = Moderate depression, 15-19 = Moderately severe depression, 20-27 = Severe depression   Psychosocial Evaluation and Intervention:   Psychosocial Re-Evaluation:   Psychosocial Discharge (Final Psychosocial Re-Evaluation):   Vocational Rehabilitation: Provide vocational rehab assistance to qualifying candidates.   Vocational Rehab Evaluation & Intervention: Vocational Rehab - 12/30/18 0837      Initial Vocational Rehab Evaluation & Intervention   Assessment shows need for Vocational Rehabilitation  No       Education: Education Goals: Education classes will be provided on a weekly basis, covering required topics. Participant will state understanding/return demonstration of  topics presented.  Learning Barriers/Preferences: Learning Barriers/Preferences - 12/30/18 0759      Learning Barriers/Preferences   Learning Barriers  None    Learning Preferences  Skilled Demonstration;Video;Pictoral       Education Topics: Count Your Pulse:  -Group instruction provided by verbal instruction, demonstration, patient participation and written materials to support subject.  Instructors address importance of being able to find your pulse and how to count your pulse when at home without a heart monitor.  Patients get hands on experience counting their pulse with staff help and individually.   Heart Attack, Angina, and Risk Factor Modification:  -Group instruction provided by verbal instruction, video, and written materials to support subject.  Instructors address signs and symptoms of angina and heart attacks.    Also discuss risk factors for heart disease and how to make changes to improve heart health risk factors.   Functional Fitness:  -Group instruction provided by verbal instruction, demonstration, patient participation, and written materials to support subject.  Instructors address safety measures for doing things around the house.  Discuss how to get up and down off the floor, how to pick things up properly, how to safely get out of a chair without assistance, and balance training.   Meditation and Mindfulness:  -Group instruction provided by verbal instruction, patient participation, and written materials to support subject.  Instructor addresses importance of mindfulness and meditation practice to help reduce stress and improve awareness.  Instructor also leads participants through a meditation exercise.    Stretching for Flexibility and Mobility:  -Group instruction provided by verbal instruction, patient participation, and written materials to support subject.  Instructors lead participants through series of stretches that are designed to increase flexibility  thus improving mobility.  These stretches are additional exercise for major muscle groups that are typically performed during regular warm up and cool down.   Hands Only CPR:  -Group verbal, video,  and participation provides a basic overview of AHA guidelines for community CPR. Role-play of emergencies allow participants the opportunity to practice calling for help and chest compression technique with discussion of AED use.   Hypertension: -Group verbal and written instruction that provides a basic overview of hypertension including the most recent diagnostic guidelines, risk factor reduction with self-care instructions and medication management.    Nutrition I class: Heart Healthy Eating:  -Group instruction provided by PowerPoint slides, verbal discussion, and written materials to support subject matter. The instructor gives an explanation and review of the Therapeutic Lifestyle Changes diet recommendations, which includes a discussion on lipid goals, dietary fat, sodium, fiber, plant stanol/sterol esters, sugar, and the components of a well-balanced, healthy diet.   Nutrition II class: Lifestyle Skills:  -Group instruction provided by PowerPoint slides, verbal discussion, and written materials to support subject matter. The instructor gives an explanation and review of label reading, grocery shopping for heart health, heart healthy recipe modifications, and ways to make healthier choices when eating out.   Diabetes Question & Answer:  -Group instruction provided by PowerPoint slides, verbal discussion, and written materials to support subject matter. The instructor gives an explanation and review of diabetes co-morbidities, pre- and post-prandial blood glucose goals, pre-exercise blood glucose goals, signs, symptoms, and treatment of hypoglycemia and hyperglycemia, and foot care basics.   Diabetes Blitz:  -Group instruction provided by PowerPoint slides, verbal discussion, and written  materials to support subject matter. The instructor gives an explanation and review of the physiology behind type 1 and type 2 diabetes, diabetes medications and rational behind using different medications, pre- and post-prandial blood glucose recommendations and Hemoglobin A1c goals, diabetes diet, and exercise including blood glucose guidelines for exercising safely.    Portion Distortion:  -Group instruction provided by PowerPoint slides, verbal discussion, written materials, and food models to support subject matter. The instructor gives an explanation of serving size versus portion size, changes in portions sizes over the last 20 years, and what consists of a serving from each food group.   Stress Management:  -Group instruction provided by verbal instruction, video, and written materials to support subject matter.  Instructors review role of stress in heart disease and how to cope with stress positively.     Exercising on Your Own:  -Group instruction provided by verbal instruction, power point, and written materials to support subject.  Instructors discuss benefits of exercise, components of exercise, frequency and intensity of exercise, and end points for exercise.  Also discuss use of nitroglycerin and activating EMS.  Review options of places to exercise outside of rehab.  Review guidelines for sex with heart disease.   Cardiac Drugs I:  -Group instruction provided by verbal instruction and written materials to support subject.  Instructor reviews cardiac drug classes: antiplatelets, anticoagulants, beta blockers, and statins.  Instructor discusses reasons, side effects, and lifestyle considerations for each drug class.   Cardiac Drugs II:  -Group instruction provided by verbal instruction and written materials to support subject.  Instructor reviews cardiac drug classes: angiotensin converting enzyme inhibitors (ACE-I), angiotensin II receptor blockers (ARBs), nitrates, and calcium  channel blockers.  Instructor discusses reasons, side effects, and lifestyle considerations for each drug class.   Anatomy and Physiology of the Circulatory System:  Group verbal and written instruction and models provide basic cardiac anatomy and physiology, with the coronary electrical and arterial systems. Review of: AMI, Angina, Valve disease, Heart Failure, Peripheral Artery Disease, Cardiac Arrhythmia, Pacemakers, and the ICD.   Other  Education:  -Group or individual verbal, written, or video instructions that support the educational goals of the cardiac rehab program.   Holiday Eating Survival Tips:  -Group instruction provided by PowerPoint slides, verbal discussion, and written materials to support subject matter. The instructor gives patients tips, tricks, and techniques to help them not only survive but enjoy the holidays despite the onslaught of food that accompanies the holidays.   Knowledge Questionnaire Score: Knowledge Questionnaire Score - 12/30/18 0933      Knowledge Questionnaire Score   Pre Score  23/24       Core Components/Risk Factors/Patient Goals at Admission: Personal Goals and Risk Factors at Admission - 12/30/18 0933      Core Components/Risk Factors/Patient Goals on Admission    Weight Management  Yes;Weight Loss    Intervention  Weight Management: Provide education and appropriate resources to help participant work on and attain dietary goals.    Admit Weight  167 lb 8.8 oz (76 kg)    Goal Weight: Short Term  161 lb (73 kg)    Goal Weight: Long Term  140 lb (63.5 kg)    Expected Outcomes  Short Term: Continue to assess and modify interventions until short term weight is achieved;Long Term: Adherence to nutrition and physical activity/exercise program aimed toward attainment of established weight goal;Weight Loss: Understanding of general recommendations for a balanced deficit meal plan, which promotes 1-2 lb weight loss per week and includes a negative  energy balance of [phone] kcal/d    Hypertension  Yes    Intervention  Provide education on lifestyle modifcations including regular physical activity/exercise, weight management, moderate sodium restriction and increased consumption of fresh fruit, vegetables, and low fat dairy, alcohol moderation, and smoking cessation.;Monitor prescription use compliance.    Expected Outcomes  Short Term: Continued assessment and intervention until BP is < 140/85mm HG in hypertensive participants. < 130/36mm HG in hypertensive participants with diabetes, heart failure or chronic kidney disease.;Long Term: Maintenance of blood pressure at goal levels.    Lipids  Yes    Intervention  Provide education and support for participant on nutrition & aerobic/resistive exercise along with prescribed medications to achieve LDL 70mg , HDL >40mg .    Expected Outcomes  Short Term: Participant states understanding of desired cholesterol values and is compliant with medications prescribed. Participant is following exercise prescription and nutrition guidelines.;Long Term: Cholesterol controlled with medications as prescribed, with individualized exercise RX and with personalized nutrition plan. Value goals: LDL < 70mg , HDL > 40 mg.       Core Components/Risk Factors/Patient Goals Review:    Core Components/Risk Factors/Patient Goals at Discharge (Final Review):    ITP Comments: ITP Comments    Row Name 12/30/18 0736           ITP Comments  Medical Director- Dr. Fransico Him, MD          Comments: Patient attended orientation on 12/30/2018 to review rules and guidelines for program.  Completed 6 minute walk test, Intitial ITP, and exercise prescription.  VSS. Telemetry-NSR.  Asymptomatic. Safety measures and social distancing in place per CDC guidelines.

## 2019-01-05 ENCOUNTER — Encounter (HOSPITAL_COMMUNITY): Payer: Medicare Other

## 2019-01-05 ENCOUNTER — Encounter (HOSPITAL_COMMUNITY)
Admission: RE | Admit: 2019-01-05 | Discharge: 2019-01-05 | Disposition: A | Discharge status: Home / Self Care | Payer: Medicare Other | Source: Ambulatory Visit | Attending: Cardiovascular Disease | Admitting: Cardiovascular Disease

## 2019-01-05 ENCOUNTER — Other Ambulatory Visit: Payer: Self-pay

## 2019-01-05 DIAGNOSIS — I214 Non-ST elevation (NSTEMI) myocardial infarction: Secondary | ICD-10-CM | POA: Diagnosis not present

## 2019-01-05 DIAGNOSIS — Z955 Presence of coronary angioplasty implant and graft: Secondary | ICD-10-CM | POA: Diagnosis present

## 2019-01-05 NOTE — Progress Notes (Signed)
  Daily Session Note  Patient Details  Name: Heather Merritt MRN: [id number] Date of Birth: October 09, 1955 Referring Provider:     [facility] from 12/30/2018 in [place]  Referring Provider  Quay Burow, MD      Encounter Date: 01/05/2019  Check In:   Capillary Blood Glucose: No results found for this or any previous visit (from the past 24 hour(s)).    Social History   Tobacco Use  Smoking Status Former Smoker  Smokeless Tobacco Never Used  Tobacco Comment   quit in 76yr ago    Goals Met:  Completed one full station of exercise  Goals Unmet:  Reported a twinge in her chest  Comments: Pt started cardiac rehab today.  Pt tolerated the first station of exercise on the nustep without difficulty. Patient reported having a twinge of discomfort in her chest on the recument bike. [name] rated the discomfort a 2/10 on a 1-10 scale Blood pressure 142/70, telemetry-Sinus Rhythm, level 3.0 on the recumbent bike Oxygen saturation 97% on room air. Exercise stopped. [name]'s discomfort went away with rest. Medication list reconciled. NPecolia AdesNP paged and notified. [name] to have the patient work out on a lower work load level when she returns to exercise on Wednesday. [name] to let Dr BKennon Holteroffice if she has any more discomfort. She will need to be seen for follow up if she has any more discomfort. Patient left cardiac rehab without complaints or symptoms.[name] RN,BSN 01/06/2019 3:50 PM   Dr. TFransico Himis [facility] Director [facility]

## 2019-01-07 ENCOUNTER — Other Ambulatory Visit: Payer: Self-pay

## 2019-01-07 ENCOUNTER — Encounter (HOSPITAL_COMMUNITY): Payer: Medicare Other

## 2019-01-07 ENCOUNTER — Encounter (HOSPITAL_COMMUNITY)
Admission: RE | Admit: 2019-01-07 | Discharge: 2019-01-07 | Disposition: A | Discharge status: Home / Self Care | Payer: Medicare Other | Source: Ambulatory Visit | Attending: Cardiovascular Disease | Admitting: Cardiovascular Disease

## 2019-01-07 DIAGNOSIS — I214 Non-ST elevation (NSTEMI) myocardial infarction: Secondary | ICD-10-CM | POA: Diagnosis not present

## 2019-01-07 DIAGNOSIS — Z955 Presence of coronary angioplasty implant and graft: Secondary | ICD-10-CM

## 2019-01-07 NOTE — Progress Notes (Signed)
Daily Session Note  Patient Details  Name: Heather Merritt MRN: [id number] Date of Birth: [birth year] Referring Provider:     [facility] from 12/30/2018 in [place]  Referring Provider  Quay Burow, MD      Encounter Date: 01/07/2019  Check In:   Capillary Blood Glucose: No results found for this or any previous visit (from the past 24 hour(s)).    Social History   Tobacco Use  Smoking Status Former Smoker  Smokeless Tobacco Never Used  Tobacco Comment   quit in 43yr ago    Goals Met:  Exercise tolerated well No report of cardiac concerns or symptoms  Goals Unmet:  Not Applicable  Comments: Pt started cardiac rehab today.  Pt tolerated light exercise without difficulty. VSS, telemetry-NSR, asymptomatic.  Medication list reconciled. Pt denies barriers to medicaiton compliance.  PSYCHOSOCIAL ASSESSMENT:  PHQ-0. Pt exhibits positive coping skills, hopeful outlook with supportive family. No psychosocial needs identified at this time, no psychosocial interventions necessary. Pt oriented to exercise equipment and routine. Understanding verbalized.   Dr. TFransico Himis Medical Director [facility] Re[facility]

## 2019-01-09 ENCOUNTER — Encounter (HOSPITAL_COMMUNITY): Payer: Medicare Other

## 2019-01-09 ENCOUNTER — Encounter (HOSPITAL_COMMUNITY)
Admission: RE | Admit: 2019-01-09 | Discharge: 2019-01-09 | Disposition: A | Discharge status: Home / Self Care | Payer: Medicare Other | Source: Ambulatory Visit | Attending: Cardiovascular Disease | Admitting: Cardiovascular Disease

## 2019-01-09 ENCOUNTER — Other Ambulatory Visit: Payer: Self-pay

## 2019-01-09 DIAGNOSIS — Z955 Presence of coronary angioplasty implant and graft: Secondary | ICD-10-CM

## 2019-01-09 DIAGNOSIS — I214 Non-ST elevation (NSTEMI) myocardial infarction: Secondary | ICD-10-CM | POA: Diagnosis not present

## 2019-01-12 ENCOUNTER — Other Ambulatory Visit: Payer: Self-pay

## 2019-01-12 ENCOUNTER — Encounter (HOSPITAL_COMMUNITY)
Admission: RE | Admit: 2019-01-12 | Discharge: 2019-01-12 | Disposition: A | Discharge status: Home / Self Care | Payer: Medicare Other | Source: Ambulatory Visit | Attending: Cardiovascular Disease | Admitting: Cardiovascular Disease

## 2019-01-12 ENCOUNTER — Encounter (HOSPITAL_COMMUNITY): Payer: Medicare Other

## 2019-01-12 DIAGNOSIS — I214 Non-ST elevation (NSTEMI) myocardial infarction: Secondary | ICD-10-CM | POA: Diagnosis not present

## 2019-01-12 DIAGNOSIS — Z955 Presence of coronary angioplasty implant and graft: Secondary | ICD-10-CM

## 2019-01-13 NOTE — Progress Notes (Signed)
Reviewed home exercise guidelines with patient including endpoints, temperature precautions, target heart rate and rate of perceived exertion. Pt is walking as her mode of home exercise. Pt voices understanding of instructions given. Sherea Liptak M Chloe Bluett, MS, ACSM CEP   

## 2019-01-14 ENCOUNTER — Encounter (HOSPITAL_COMMUNITY): Payer: Medicare Other

## 2019-01-14 ENCOUNTER — Other Ambulatory Visit: Payer: Self-pay

## 2019-01-14 ENCOUNTER — Encounter (HOSPITAL_COMMUNITY)
Admission: RE | Admit: 2019-01-14 | Discharge: 2019-01-14 | Disposition: A | Discharge status: Home / Self Care | Payer: Medicare Other | Source: Ambulatory Visit | Attending: Cardiovascular Disease | Admitting: Cardiovascular Disease

## 2019-01-14 DIAGNOSIS — I214 Non-ST elevation (NSTEMI) myocardial infarction: Secondary | ICD-10-CM | POA: Diagnosis not present

## 2019-01-14 DIAGNOSIS — Z955 Presence of coronary angioplasty implant and graft: Secondary | ICD-10-CM

## 2019-01-15 NOTE — Progress Notes (Signed)
Cardiac Individual Treatment Plan  Patient Details  Name: Heather Merritt MRN: [id number] Date of Birth: 1955/11/26 Referring Provider:     [facility] from 12/30/2018 in [place]  Referring Provider  Quay Burow, MD      Initial Encounter Date:    CARDIAC REHAB PHASE II ORIENTATION from 12/30/2018 in [place]  Date  12/30/18      Visit Diagnosis: 10/03/18 NSTEMI  10/06/18 DES RCA  Patient's Home Medications on Admission:  Current Outpatient Medications:  .  aspirin 81 MG tablet, Take 81 mg by mouth daily.  , Disp: , Rfl:  .  atorvastatin (LIPITOR) 10 MG tablet, TAKE 1 TABLET BY MOUTH  EVERY OTHER DAY, Disp: 90 tablet, Rfl: 3 .  Calcium Citrate (CITRACAL PO), Take 2 tablets by mouth daily. , Disp: , Rfl:  .  Cholecalciferol (VITAMIN D) 2000 UNITS CAPS, Take 2,000 Units by mouth daily., Disp: , Rfl:  .  clopidogrel (PLAVIX) 75 MG tablet, Take 1 tablet (75 mg total) by mouth daily., Disp: 90 tablet, Rfl: 3 .  esomeprazole (NEXIUM) 40 MG capsule, Take 1 capsule (40 mg total) by mouth daily. (Patient not taking: Reported on 01/05/2019), Disp: 90 capsule, Rfl: 3 .  fish oil-omega-3 fatty acids 1000 MG capsule, Take 1 g by mouth daily. , Disp: , Rfl:  .  metoprolol tartrate (LOPRESSOR) 25 MG tablet, TAKE 0.5 TABLETS (12.5 MG TOTAL) BY MOUTH 2 (TWO) TIMES DAILY., Disp: 30 tablet, Rfl: 1 .  metroNIDAZOLE (METROGEL) 0.75 % gel, Apply 1 application topically at bedtime., Disp: 45 g, Rfl: 0 .  Multiple Vitamin (MULITIVITAMIN WITH MINERALS) TABS, Take 1 tablet by mouth daily., Disp: , Rfl:  .  nitroGLYCERIN (NITROSTAT) 0.4 MG SL tablet, Place 1 tablet (0.4 mg total) under the tongue every 5 (five) minutes x 3 doses as needed for chest pain., Disp: 25 tablet, Rfl: 12 .  pimecrolimus (ELIDEL) 1 % cream, Apply 1 application topically every morning., Disp: 30 g, Rfl: 0 .  valsartan-hydrochlorothiazide  (DIOVAN-HCT) 160-12.5 MG tablet, Take 1 tablet by mouth daily., Disp: 90 tablet, Rfl: 3 .  vitamin E 400 UNIT capsule, Take 400 Units by mouth daily., Disp: , Rfl:   Past Medical History: Past Medical History:  Diagnosis Date  . Allergy    SEASONAL  . ANXIETY 10/28/2007  . Arthritis   . CAD (coronary artery disease), native coronary artery    a. Cath 10/06/2018 - S/p DES to [facility]; medical therapy for high grade ostial/pro 2nd digonal   . Cataracts, bilateral    immature  . Chronic back pain   . Coronary artery disease   . DIVERTICULOSIS, COLON 10/28/2007  . Dry skin    red patch on right knee area  . GERD 10/28/2007   takes Prevacid daily  . H/O hiatal hernia   . HLD (hyperlipidemia) 12/12/2017  . Hx of colonic polyps   . HYPERLIPIDEMIA 10/28/2007   unable to take meds d/t allergies  . HYPERTENSION 10/28/2007   takes Diovan daily  . Internal hemorrhoids   . Irritable bowel syndrome (IBS)   . Joint pain   . Joint swelling   . LIVER FUNCTION TESTS, ABNORMAL, HX OF 10/29/2007  . Lumbar disc disease   . Migraine   . Myocardial infarction ([place])   . Obesity   . OSTEOPENIA 10/29/2007  . Osteoporosis   . Presbyacusis 01/04/2010  . Rosacea   . Seasonal allergies  takes Clarinex daily  . Vertigo    hx of  . Vitamin D deficiency    takes Vit D daily    Tobacco Use: Social History   Tobacco Use  Smoking Status Former Smoker  Smokeless Tobacco Never Used  Tobacco Comment   quit in 93yrs ago    Labs: Recent Chemical engineer    Labs for ITP Cardiac and Pulmonary Rehab Latest Ref Rng & Units 04/26/2015 05/31/2016 06/05/2017 10/04/2018 10/15/2018   Cholestrol 0 - 200 mg/dL 255(H) 288(H) 287(H) 204(H) -   LDLCALC 0 - 99 mg/dL - - - 112(H) -   LDLDIRECT mg/dL 166.0 181.0 177.0 - -   HDL >40 mg/dL 39.90 40.40 45.40 36(L) -   Trlycerides <150 mg/dL 224.0(H) 208.0(H) 201.0(H) 280(H) -   Hemoglobin A1c 4.0 - 5.6 % - - - - 5.2      Capillary Blood Glucose: No results found for:  GLUCAP   Exercise Target Goals: Exercise Program Goal: Individual exercise prescription set using results from initial 6 min walk test and THRR while considering  patient's activity barriers and safety.   Exercise Prescription Goal: Starting with aerobic activity 30 plus minutes a day, 3 days per week for initial exercise prescription. Provide home exercise prescription and guidelines that participant acknowledges understanding prior to discharge.  Activity Barriers & Risk Stratification: Activity Barriers & Cardiac Risk Stratification - 12/30/18 0921      Activity Barriers & Cardiac Risk Stratification   Activity Barriers  Neck/Spine Problems;Back Problems;Balance Concerns;Other (comment)    Comments  Patient has 2 herniated discs (L4 & L5), hx of cervical disc replacement (C3,4, & 5), pain from statin intolerance, and has previously had physical therapy for balance concerns secondary to an inner ear issue.    Cardiac Risk Stratification  High       6 Minute Walk: 6 Minute Walk    Row Name 12/30/18 0809         6 Minute Walk   Phase  Initial     Distance  1506 feet     Walk Time  6 minutes     # of Rest Breaks  0     MPH  2.85     METS  3.23     RPE  11     Perceived Dyspnea   0     VO2 Peak  11.31     Symptoms  No     Resting HR  70 bpm     Resting BP  126/74     Resting Oxygen Saturation   98 %     Exercise Oxygen Saturation  during 6 min walk  99 %     Max Ex. HR  87 bpm     Max Ex. BP  128/78     2 Minute Post BP  120/72        Oxygen Initial Assessment:   Oxygen Re-Evaluation:   Oxygen Discharge (Final Oxygen Re-Evaluation):   Initial Exercise Prescription: Initial Exercise Prescription - 12/30/18 0900      Date of Initial Exercise RX and Referring Provider   Date  12/30/18    Referring Provider  Quay Burow, MD    Expected Discharge Date  02/27/19      Recumbant Bike   Level  3    Watts  30    Minutes  15    METs  3.24      NuStep    Level  3  SPM  85    Minutes  15    METs  3.2      Prescription Details   Frequency (times per week)  3    Duration  Progress to 30 minutes of continuous aerobic without signs/symptoms of physical distress      Intensity   THRR 40-80% of Max Heartrate  62-124    Ratings of Perceived Exertion  11-13    Perceived Dyspnea  0-4      Progression   Progression  Continue to progress workloads to maintain intensity without signs/symptoms of physical distress.      Resistance Training   Training Prescription  Yes    Weight  3lbs    Reps  10-15       Perform Capillary Blood Glucose checks as needed.  Exercise Prescription Changes: Exercise Prescription Changes    Row Name 01/05/19 1503 01/12/19 1502           Response to Exercise   Blood Pressure (Admit)  110/70  106/60      Blood Pressure (Exercise)  140/72  160/72      Blood Pressure (Exit)  104/70  104/64      Heart Rate (Admit)  79 bpm  82 bpm      Heart Rate (Exercise)  101 bpm  111 bpm      Heart Rate (Exit)  78 bpm  86 bpm      Rating of Perceived Exertion (Exercise)  12  12      Symptoms  Chest discomfort  none      Comments  Patient c/o feeling a "twinge" of chest discomfort on the recumbent bie, her 2nd station of exercise.  -      Duration  Progress to 30 minutes of  aerobic without signs/symptoms of physical distress  Progress to 30 minutes of  aerobic without signs/symptoms of physical distress      Intensity  THRR unchanged  THRR unchanged        Progression   Progression  Continue to progress workloads to maintain intensity without signs/symptoms of physical distress.  Continue to progress workloads to maintain intensity without signs/symptoms of physical distress.      Average METs  2.9  3.1        Resistance Training   Training Prescription  No Exercise stopped on 2nd station because of chest discomfort.  Yes      Weight  -  3lbs      Reps  -  10-15      Time  -  10 Minutes        Interval Training    Interval Training  No  No        Recumbant Bike   Level  3  3      Minutes  13  15      METs  2.8  3.1        NuStep   Level  3  3      SPM  85  85      Minutes  15  15      METs  2.9  3.1        Home Exercise Plan   Plans to continue exercise at  -  Home (comment) walking      Frequency  -  Add 3 additional days to program exercise sessions.      Initial Home Exercises Provided  -  01/12/19  Exercise Comments: Exercise Comments    Row Name 01/05/19 1549 01/12/19 1510         Exercise Comments  Patient started well with exercise. Pt c/o some chest discomfort during her 2nd station, recumbent bike. Exercise stopped, cardiology office contacted.  Reviewed home exericse plan, METs, and goals with patient.         Exercise Goals and Review: Exercise Goals    Row Name 12/30/18 0746             Exercise Goals   Increase Physical Activity  Yes       Intervention  Provide advice, education, support and counseling about physical activity/exercise needs.;Develop an individualized exercise prescription for aerobic and resistive training based on initial evaluation findings, risk stratification, comorbidities and participant's personal goals.       Expected Outcomes  Short Term: Attend rehab on a regular basis to increase amount of physical activity.;Long Term: Exercising regularly at least 3-5 days a week.;Long Term: Add in home exercise to make exercise part of routine and to increase amount of physical activity.       Increase Strength and Stamina  Yes       Intervention  Provide advice, education, support and counseling about physical activity/exercise needs.;Develop an individualized exercise prescription for aerobic and resistive training based on initial evaluation findings, risk stratification, comorbidities and participant's personal goals.       Expected Outcomes  Short Term: Increase workloads from initial exercise prescription for resistance, speed, and METs.;Short  Term: Perform resistance training exercises routinely during rehab and add in resistance training at home;Long Term: Improve cardiorespiratory fitness, muscular endurance and strength as measured by increased METs and functional capacity (6MWT)       Able to understand and use rate of perceived exertion (RPE) scale  Yes       Intervention  Provide education and explanation on how to use RPE scale       Expected Outcomes  Short Term: Able to use RPE daily in rehab to express subjective intensity level;Long Term:  Able to use RPE to guide intensity level when exercising independently       Knowledge and understanding of Target Heart Rate Range (THRR)  Yes       Intervention  Provide education and explanation of THRR including how the numbers were predicted and where they are located for reference       Expected Outcomes  Short Term: Able to state/look up THRR;Long Term: Able to use THRR to govern intensity when exercising independently;Short Term: Able to use daily as guideline for intensity in rehab       Able to check pulse independently  Yes       Intervention  Provide education and demonstration on how to check pulse in carotid and radial arteries.;Review the importance of being able to check your own pulse for safety during independent exercise       Expected Outcomes  Short Term: Able to explain why pulse checking is important during independent exercise;Long Term: Able to check pulse independently and accurately       Understanding of Exercise Prescription  Yes       Intervention  Provide education, explanation, and written materials on patient's individual exercise prescription       Expected Outcomes  Short Term: Able to explain program exercise prescription;Long Term: Able to explain home exercise prescription to exercise independently          Exercise Goals Re-Evaluation : Exercise Goals  Re-Evaluation    Row Name 01/05/19 1549 01/12/19 1510           Exercise Goal Re-Evaluation    Exercise Goals Review  Increase Physical Activity;Able to understand and use rate of perceived exertion (RPE) scale  Increase Physical Activity;Able to understand and use rate of perceived exertion (RPE) scale;Knowledge and understanding of Target Heart Rate Range (THRR);Understanding of Exercise Prescription      Comments  Patient able to understand and use RPE scale appropriately. Pt c/o chest discomfort on the recumbent bike, which resolved with rest. Cardiology office contacted and sx's reported.  Reviewed home exercise guidelines with patient including THRR, RPE scale, and endpoints for exercise. Instructed patient on how to manually count pulse, handout given. Pt is walking 35 minutes at home in addition to exercise at cardiac rehab. Pt is also stretching at home, including stretches she got from physical therapy.      Expected Outcomes  Increase workloads as tolerated to help improve cardiorespiratory fitness. Exercise without symptoms.  Patient will exercise at least 30 minutes, 5-7 days/ week to help achieve personal health and fitness goals.          Discharge Exercise Prescription (Final Exercise Prescription Changes): Exercise Prescription Changes - 01/12/19 1502      Response to Exercise   Blood Pressure (Admit)  106/60    Blood Pressure (Exercise)  160/72    Blood Pressure (Exit)  104/64    Heart Rate (Admit)  82 bpm    Heart Rate (Exercise)  111 bpm    Heart Rate (Exit)  86 bpm    Rating of Perceived Exertion (Exercise)  12    Symptoms  none    Duration  Progress to 30 minutes of  aerobic without signs/symptoms of physical distress    Intensity  THRR unchanged      Progression   Progression  Continue to progress workloads to maintain intensity without signs/symptoms of physical distress.    Average METs  3.1      Resistance Training   Training Prescription  Yes    Weight  3lbs    Reps  10-15    Time  10 Minutes      Interval Training   Interval Training  No       Recumbant Bike   Level  3    Minutes  15    METs  3.1      NuStep   Level  3    SPM  85    Minutes  15    METs  3.1      Home Exercise Plan   Plans to continue exercise at  Home (comment)   walking   Frequency  Add 3 additional days to program exercise sessions.    Initial Home Exercises Provided  01/12/19       Nutrition:  Target Goals: Understanding of nutrition guidelines, daily intake of sodium 1500mg , cholesterol 200mg , calories 30% from fat and 7% or less from saturated fats, daily to have 5 or more servings of fruits and vegetables.  Biometrics: Pre Biometrics - 12/30/18 0746      Pre Biometrics   Height  5' 3.5" (1.613 m)    Weight  76 kg    Waist Circumference  35.5 inches    Hip Circumference  43.5 inches    Waist to Hip Ratio  0.82 %    BMI (Calculated)  29.21    Triceps Skinfold  21 mm    %  Body Fat  38.3 %    Grip Strength  35.5 kg    Flexibility  17.25 in    Single Leg Stand  30 seconds        Nutrition Therapy Plan and Nutrition Goals:   Nutrition Assessments:   Nutrition Goals Re-Evaluation:   Nutrition Goals Discharge (Final Nutrition Goals Re-Evaluation):   Psychosocial: Target Goals: Acknowledge presence or absence of significant depression and/or stress, maximize coping skills, provide positive support system. Participant is able to verbalize types and ability to use techniques and skills needed for reducing stress and depression.  Initial Review & Psychosocial Screening: Initial Psych Review & Screening - 12/30/18 0838      Initial Review   Current issues with  None Identified      Family Dynamics   Good Support System?  Yes    Comments  Patient denies psychosocial barriers to participation in CR. Patient will continue to utilize family, friends and healthcare team for support and resources. She will maintain a positive attitude and participate in healthy stress management if psychosocial barriers arise. She does admit to mild  stess secondary to Covid-19 and social isolation however she is able to successfully manage her stress by talking on the phone with family and friends.      Barriers   Psychosocial barriers to participate in program  There are no identifiable barriers or psychosocial needs.      Screening Interventions   Interventions  Encouraged to exercise       Quality of Life Scores: Quality of Life - 12/30/18 0932      Quality of Life   Select  Quality of Life      Quality of Life Scores   Health/Function Pre  26.23 %    Socioeconomic Pre  29.14 %    Psych/Spiritual Pre  27.43 %    Family Pre  26 %    GLOBAL Pre  27.11 %      Scores of 19 and below usually indicate a poorer quality of life in these areas.  A difference of  2-3 points is a clinically meaningful difference.  A difference of 2-3 points in the total score of the Quality of Life Index has been associated with significant improvement in overall quality of life, self-image, physical symptoms, and general health in studies assessing change in quality of life.  PHQ-9: Recent Review Flowsheet Data    Depression screen [facility] 2/9 12/30/2018 10/15/2018 10/15/2018 06/11/2017 06/05/2016   Decreased Interest 0 0 0 0 0   Down, Depressed, Hopeless 0 0 0 0 0   PHQ - 2 Score 0 0 0 0 0   Altered sleeping - - 0 - -   Tired, decreased energy - - 0 - -   Change in appetite - - 0 - -   Feeling bad or failure about yourself  - - 0 - -   Trouble concentrating - - 0 - -   Moving slowly or fidgety/restless - - 0 - -   Suicidal thoughts - - 0 - -   PHQ-9 Score - - 0 - -     Interpretation of Total Score  Total Score Depression Severity:  1-4 = Minimal depression, 5-9 = Mild depression, 10-14 = Moderate depression, 15-19 = Moderately severe depression, 20-27 = Severe depression   Psychosocial Evaluation and Intervention: Psychosocial Evaluation - 01/07/19 1600      Psychosocial Evaluation & Interventions   Interventions  Encouraged to exercise with  the  program and follow exercise prescription    Comments  There are no psychosocial barriers to participation in CR at this time. Patient admits to having a strong support system including family, friends, and health care providers. She enjoys sewing, photography, and reading which are independent activities that can be done during Covid-19 restrictions. She continues to have a positive attitude and outlook.    Expected Outcomes  Patient will continue to have a positive attitude and outlook. She will utilize her strong support system if psychosocial barriers arise.       Psychosocial Re-Evaluation: Psychosocial Re-Evaluation    [place] Name 01/15/19 1413             Psychosocial Re-Evaluation   Current issues with  None Identified       Comments  Daris has not voiced any increased stressors since she has been partcipating in phase 2 cardiac rehab       Interventions  Encouraged to attend Cardiac Rehabilitation for the exercise       Continue Psychosocial Services   No Follow up required          Psychosocial Discharge (Final Psychosocial Re-Evaluation): Psychosocial Re-Evaluation - 01/15/19 1413      Psychosocial Re-Evaluation   Current issues with  None Identified    Comments  Krupa has not voiced any increased stressors since she has been partcipating in phase 2 cardiac rehab    Interventions  Encouraged to attend Cardiac Rehabilitation for the exercise    Continue Psychosocial Services   No Follow up required       Vocational Rehabilitation: Provide vocational rehab assistance to qualifying candidates.   Vocational Rehab Evaluation & Intervention: Vocational Rehab - 12/30/18 0837      Initial Vocational Rehab Evaluation & Intervention   Assessment shows need for Vocational Rehabilitation  No       Education: Education Goals: Education classes will be provided on a weekly basis, covering required topics. Participant will state understanding/return demonstration of topics  presented.  Learning Barriers/Preferences: Learning Barriers/Preferences - 12/30/18 0759      Learning Barriers/Preferences   Learning Barriers  None    Learning Preferences  Skilled Demonstration;Video;Pictoral       Education Topics: Hypertension, Hypertension Reduction -Define heart disease and high blood pressure. Discus how high blood pressure affects the body and ways to reduce high blood pressure.   Exercise and Your Heart -Discuss why it is important to exercise, the FITT principles of exercise, normal and abnormal responses to exercise, and how to exercise safely.   Angina -Discuss definition of angina, causes of angina, treatment of angina, and how to decrease risk of having angina.   Cardiac Medications -Review what the following cardiac medications are used for, how they affect the body, and side effects that may occur when taking the medications.  Medications include Aspirin, Beta blockers, calcium channel blockers, ACE Inhibitors, angiotensin receptor blockers, diuretics, digoxin, and antihyperlipidemics.   Congestive Heart Failure -Discuss the definition of CHF, how to live with CHF, the signs and symptoms of CHF, and how keep track of weight and sodium intake.   Heart Disease and Intimacy -Discus the effect sexual activity has on the heart, how changes occur during intimacy as we age, and safety during sexual activity.   Smoking Cessation / COPD -Discuss different methods to quit smoking, the health benefits of quitting smoking, and the definition of COPD.   Nutrition I: Fats -Discuss the types of cholesterol, what cholesterol does to the heart, and  how cholesterol levels can be controlled.   Nutrition II: Labels -Discuss the different components of food labels and how to read food label   Heart Parts/Heart Disease and PAD -Discuss the anatomy of the heart, the pathway of blood circulation through the heart, and these are affected by heart  disease.   Stress I: Signs and Symptoms -Discuss the causes of stress, how stress may lead to anxiety and depression, and ways to limit stress.   Stress II: Relaxation -Discuss different types of relaxation techniques to limit stress.   Warning Signs of Stroke / TIA -Discuss definition of a stroke, what the signs and symptoms are of a stroke, and how to identify when someone is having stroke.   Knowledge Questionnaire Score: Knowledge Questionnaire Score - 12/30/18 0933      Knowledge Questionnaire Score   Pre Score  23/24       Core Components/Risk Factors/Patient Goals at Admission: Personal Goals and Risk Factors at Admission - 12/30/18 0933      Core Components/Risk Factors/Patient Goals on Admission    Weight Management  Yes;Weight Loss    Intervention  Weight Management: Provide education and appropriate resources to help participant work on and attain dietary goals.    Admit Weight  167 lb 8.8 oz (76 kg)    Goal Weight: Short Term  161 lb (73 kg)    Goal Weight: Long Term  140 lb (63.5 kg)    Expected Outcomes  Short Term: Continue to assess and modify interventions until short term weight is achieved;Long Term: Adherence to nutrition and physical activity/exercise program aimed toward attainment of established weight goal;Weight Loss: Understanding of general recommendations for a balanced deficit meal plan, which promotes 1-2 lb weight loss per week and includes a negative energy balance of [phone] kcal/d    Hypertension  Yes    Intervention  Provide education on lifestyle modifcations including regular physical activity/exercise, weight management, moderate sodium restriction and increased consumption of fresh fruit, vegetables, and low fat dairy, alcohol moderation, and smoking cessation.;Monitor prescription use compliance.    Expected Outcomes  Short Term: Continued assessment and intervention until BP is < 140/30mm HG in hypertensive participants. < 130/10mm HG in  hypertensive participants with diabetes, heart failure or chronic kidney disease.;Long Term: Maintenance of blood pressure at goal levels.    Lipids  Yes    Intervention  Provide education and support for participant on nutrition & aerobic/resistive exercise along with prescribed medications to achieve LDL 70mg , HDL >40mg .    Expected Outcomes  Short Term: Participant states understanding of desired cholesterol values and is compliant with medications prescribed. Participant is following exercise prescription and nutrition guidelines.;Long Term: Cholesterol controlled with medications as prescribed, with individualized exercise RX and with personalized nutrition plan. Value goals: LDL < 70mg , HDL > 40 mg.       Core Components/Risk Factors/Patient Goals Review:  Goals and Risk Factor Review    Row Name 01/05/19 1637 01/07/19 1600 01/15/19 1414         Core Components/Risk Factors/Patient Goals Review   Personal Goals Review  Weight Management/Obesity;Hypertension;Lipids  Weight Management/Obesity;Hypertension;Lipids  Weight Management/Obesity;Hypertension;Lipids     Review  Alayne started exercise today had twinge of chest discomfort on the second station  of exercise.  Starlene has multiple CAD risk factors and eager to participate in CR.  Tinna has been doing well with exercise at cardiac rehab. Airiana has not had any more chest discomfort since her first day of exercise  Expected Outcomes  Will continie to monitor for signs and symptoms. Follow exercise guidelines, and lifestyle modifications for diet and exercise  Patient will continue to participate in CR to modify risk factors and improve her overall health. Her personal goals are to learn exercise limitations secondary to her heart disease and how to safely overcome them. She also would like to loose weight and reduce her risk factors.  Patient will continue to participate in CR to modify risk factors and improve her overall health. Her  personal goals are to learn exercise limitations secondary to her heart disease and how to safely overcome them. She also would like to loose weight and reduce her risk factors.        Core Components/Risk Factors/Patient Goals at Discharge (Final Review):  Goals and Risk Factor Review - 01/15/19 1414      Core Components/Risk Factors/Patient Goals Review   Personal Goals Review  Weight Management/Obesity;Hypertension;Lipids    Review  Gimena has been doing well with exercise at cardiac rehab. Mirka has not had any more chest discomfort since her first day of exercise    Expected Outcomes  Patient will continue to participate in CR to modify risk factors and improve her overall health. Her personal goals are to learn exercise limitations secondary to her heart disease and how to safely overcome them. She also would like to loose weight and reduce her risk factors.       ITP Comments: ITP Comments    Row Name 12/30/18 0736 01/05/19 1642 01/07/19 1600 01/15/19 1408     ITP Comments  Medical Director- Dr. Fransico Him, MD  30 Day ITP Review. Satoya started exercise on 01/05/19  Patient completed her first full day of exercise without complaints  30 Day ITP Review. Chun has good attendance and participation in phase 2 cardiac rehab       Comments: See ITP comments.Barnet Pall, RN,BSN 01/15/2019 2:20 PM

## 2019-01-16 ENCOUNTER — Encounter (HOSPITAL_COMMUNITY)
Admission: RE | Admit: 2019-01-16 | Discharge: 2019-01-16 | Disposition: A | Discharge status: Home / Self Care | Payer: Medicare Other | Source: Ambulatory Visit | Attending: Cardiovascular Disease | Admitting: Cardiovascular Disease

## 2019-01-16 ENCOUNTER — Other Ambulatory Visit: Payer: Self-pay

## 2019-01-16 ENCOUNTER — Encounter (HOSPITAL_COMMUNITY): Payer: Medicare Other

## 2019-01-16 DIAGNOSIS — Z955 Presence of coronary angioplasty implant and graft: Secondary | ICD-10-CM

## 2019-01-16 DIAGNOSIS — I214 Non-ST elevation (NSTEMI) myocardial infarction: Secondary | ICD-10-CM | POA: Diagnosis not present

## 2019-01-19 ENCOUNTER — Encounter: Payer: Self-pay | Admitting: Internal Medicine

## 2019-01-19 ENCOUNTER — Encounter (HOSPITAL_COMMUNITY): Payer: Medicare Other

## 2019-01-19 ENCOUNTER — Encounter (HOSPITAL_COMMUNITY)
Admission: RE | Admit: 2019-01-19 | Discharge: 2019-01-19 | Disposition: A | Discharge status: Home / Self Care | Payer: Medicare Other | Source: Ambulatory Visit | Attending: Cardiovascular Disease | Admitting: Cardiovascular Disease

## 2019-01-19 ENCOUNTER — Other Ambulatory Visit: Payer: Self-pay

## 2019-01-19 ENCOUNTER — Ambulatory Visit (INDEPENDENT_AMBULATORY_CARE_PROVIDER_SITE_OTHER): Payer: Medicare Other | Admitting: Internal Medicine

## 2019-01-19 VITALS — BP 119/74 | HR 62 | Temp 97.5°F | Ht 63.5 in | Wt 166.2 lb

## 2019-01-19 DIAGNOSIS — E785 Hyperlipidemia, unspecified: Secondary | ICD-10-CM | POA: Diagnosis not present

## 2019-01-19 DIAGNOSIS — I214 Non-ST elevation (NSTEMI) myocardial infarction: Secondary | ICD-10-CM | POA: Diagnosis not present

## 2019-01-19 DIAGNOSIS — Z955 Presence of coronary angioplasty implant and graft: Secondary | ICD-10-CM

## 2019-01-19 DIAGNOSIS — Z789 Other specified health status: Secondary | ICD-10-CM

## 2019-01-19 DIAGNOSIS — I2510 Atherosclerotic heart disease of native coronary artery without angina pectoris: Secondary | ICD-10-CM

## 2019-01-19 LAB — LIPID PANEL
Chol/HDL Ratio: 4.1 ratio (ref 0.0–4.4)
Cholesterol, Total: 180 mg/dL (ref 100–199)
HDL: 44 mg/dL (ref >39)
LDL Chol Calc (NIH): 113 mg/dL — ABNORMAL HIGH (ref 0–99)
Triglycerides: 129 mg/dL (ref 0–149)
VLDL Cholesterol Cal: 23 mg/dL (ref 5–40)

## 2019-01-19 NOTE — Progress Notes (Signed)
LIPID CLINIC CONSULT NOTE  Chief Complaint:  Manage dyslipidemia  Primary Care Physician: Biagio Borg, MD  Primary Cardiologist:  Quay Burow, MD  HPI:  Heather Merritt is a 63 y.o. female who is being seen today for the evaluation of dyslipidemia at the request of Almyra Deforest, [place]. This is a 63 year old female patient of Dr. Alvester Chou who was referred by Almyra Deforest, PA-C, for management of dyslipidemia.  Unfortunately she recently had chest pain was found to have a non-STEMI.  Cath in July 2020 demonstrated some coronary disease specifically in the right coronary artery requiring a drug-eluting stent placement.  Since then she has done very well without recurrent chest pain.  She started cardiac rehabilitation.  She does have a longstanding history of dyslipidemia including family history of coronary disease.  She had previously been on a number of different statin medications including atorvastatin, rosuvastatin, simvastatin, pravastatin and others with significant myalgias, particularly at the higher doses.  She had been taking atorvastatin 10 mg every other day prior to this event and was convinced to take it on a daily basis afterwards.  So far that seems to be reasonably well tolerated with some mild myalgias.  She has not had reassessment of her cholesterol since July however her total cholesterol the time was 204, triglycerides 280, HDL 36 and LDL of 112.  PMHx:  Past Medical History:  Diagnosis Date  . Allergy    SEASONAL  . ANXIETY 10/28/2007  . Arthritis   . CAD (coronary artery disease), native coronary artery    a. Cath 10/06/2018 - S/p DES to [facility]; medical therapy for high grade ostial/pro 2nd digonal   . Cataracts, bilateral    immature  . Chronic back pain   . Coronary artery disease   . DIVERTICULOSIS, COLON 10/28/2007  . Dry skin    red patch on right knee area  . GERD 10/28/2007   takes Prevacid daily  . H/O hiatal hernia   . HLD (hyperlipidemia) 12/12/2017  . Hx of  colonic polyps   . HYPERLIPIDEMIA 10/28/2007   unable to take meds d/t allergies  . HYPERTENSION 10/28/2007   takes Diovan daily  . Internal hemorrhoids   . Irritable bowel syndrome (IBS)   . Joint pain   . Joint swelling   . LIVER FUNCTION TESTS, ABNORMAL, HX OF 10/29/2007  . Lumbar disc disease   . Migraine   . Myocardial infarction ([place])   . Obesity   . OSTEOPENIA 10/29/2007  . Osteoporosis   . Presbyacusis 01/04/2010  . Rosacea   . Seasonal allergies    takes Clarinex daily  . Vertigo    hx of  . Vitamin D deficiency    takes Vit D daily    Past Surgical History:  Procedure Laterality Date  . ANTERIOR CERVICAL DECOMP/DISCECTOMY FUSION  08/02/2011   Procedure: ANTERIOR CERVICAL DECOMPRESSION/DISCECTOMY FUSION 3 LEVELS;  Surgeon: Elaina Hoops, MD;  Location: [facility];  Service: Neurosurgery;  Laterality: N/A;  Cervical Three-Four, Cervical Four-Five, Cervical Five-Six  Anterior Cervical Decompression Fusion   . CARDIAC CATHETERIZATION    . COLONOSCOPY  2001/2013  . CORONARY STENT INTERVENTION N/A 10/06/2018   Procedure: CORONARY STENT INTERVENTION;  Surgeon: Lorretta Harp, MD;  Location: [facility];  Service: Cardiovascular;  Laterality: N/A;  . ESOPHAGOGASTRODUODENOSCOPY    . LEFT HEART CATH AND CORONARY ANGIOGRAPHY N/A 10/06/2018   Procedure: LEFT HEART CATH AND CORONARY ANGIOGRAPHY;  Surgeon: Lorretta Harp, MD;  Location: [facility];  Service: Cardiovascular;  Laterality: N/A;  . POLYPECTOMY    . TONSILLECTOMY    . TONSILLECTOMY  at age 13    FAMHx:  Family History  Problem Relation Age of Onset  . Atrial fibrillation Mother   . Hyperlipidemia Mother   . Stroke Mother   . Heart disease Mother   . Atrial fibrillation Father   . Hypothyroidism Sister   . Hypothyroidism Maternal Grandmother   . Diabetes Maternal Grandmother   . Anesthesia problems Neg Hx   . Hypotension Neg Hx   . Malignant hyperthermia Neg Hx   . Pseudochol deficiency Neg Hx      SOCHx:   reports that she has quit smoking. She has never used smokeless tobacco. She reports current alcohol use. She reports that she does not use drugs.  ALLERGIES:  Allergies  Allergen Reactions  . Penicillins Hives, Itching and Rash    Did it involve swelling of the face/tongue/throat, SOB, or low BP? Unknown Did it involve sudden or severe rash/hives, skin peeling, or any reaction on the inside of your mouth or nose? Unknown Did you need to seek medical attention at a hospital or doctor's office? Unknown When did it last happen?pt cant recall If all above answers are "NO", may proceed with cephalosporin use.   . Rosuvastatin Other (See Comments)    REACTION: muscle weakness  . Simvastatin Other (See Comments)    REACTION: muscle weakness  . Statins Other (See Comments)    Myalgias and weakness  . Sulfonamide Derivatives Other (See Comments)    Nausea, vomiting, abd pain  . Brilinta [Ticagrelor] Rash    Itchy rash to torso  . Sulfa Antibiotics Other (See Comments)    Nausea, vomiting, abd pain    ROS: Pertinent items noted in HPI and remainder of comprehensive ROS otherwise negative.  HOME MEDS: Current Outpatient Medications on File Prior to Visit  Medication Sig Dispense Refill  . aspirin 81 MG tablet Take 81 mg by mouth daily.      Marland Kitchen atorvastatin (LIPITOR) 10 MG tablet TAKE 1 TABLET BY MOUTH  EVERY OTHER DAY (Patient taking differently: Take 10 mg by mouth daily. TAKE 1 TABLET DAILY) 90 tablet 3  . Calcium Citrate (CITRACAL PO) Take 2 tablets by mouth daily.     . Cholecalciferol (VITAMIN D) 2000 UNITS CAPS Take 2,000 Units by mouth daily.    . clopidogrel (PLAVIX) 75 MG tablet Take 1 tablet (75 mg total) by mouth daily. 90 tablet 3  . esomeprazole (NEXIUM) 40 MG capsule Take 1 capsule (40 mg total) by mouth daily. 90 capsule 3  . fish oil-omega-3 fatty acids 1000 MG capsule Take 1 g by mouth daily.     . metoprolol tartrate (LOPRESSOR) 25 MG tablet TAKE  0.5 TABLETS (12.5 MG TOTAL) BY MOUTH 2 (TWO) TIMES DAILY. 30 tablet 1  . metroNIDAZOLE (METROGEL) 0.75 % gel Apply 1 application topically at bedtime. 45 g 0  . Multiple Vitamin (MULITIVITAMIN WITH MINERALS) TABS Take 1 tablet by mouth daily.    . nitroGLYCERIN (NITROSTAT) 0.4 MG SL tablet Place 1 tablet (0.4 mg total) under the tongue every 5 (five) minutes x 3 doses as needed for chest pain. 25 tablet 12  . pimecrolimus (ELIDEL) 1 % cream Apply 1 application topically every morning. 30 g 0  . valsartan-hydrochlorothiazide (DIOVAN-HCT) 160-12.5 MG tablet Take 1 tablet by mouth daily. 90 tablet 3  . vitamin E 400 UNIT capsule Take 400  Units by mouth daily.     No current facility-administered medications on file prior to visit.     LABS/IMAGING: No results found for this or any previous visit (from the past 48 hour(s)). No results found.  LIPID PANEL:    Component Value Date/Time   CHOL 204 (H) 10/04/2018 0312   TRIG 280 (H) 10/04/2018 0312   HDL 36 (L) 10/04/2018 0312   CHOLHDL 5.7 10/04/2018 0312   VLDL 56 (H) 10/04/2018 0312   LDLCALC 112 (H) 10/04/2018 0312   LDLDIRECT 177.0 06/05/2017 1301    WEIGHTS: Wt Readings from Last 3 Encounters:  01/19/19 166 lb 3.2 oz (75.4 kg)  12/30/18 167 lb 8.8 oz (76 kg)  10/16/18 167 lb (75.8 kg)    VITALS: BP 119/74   Pulse 62   Temp (!) 97.5 F (36.4 C)   Ht 5' 3.5" (1.613 m)   Wt 166 lb 3.2 oz (75.4 kg)   SpO2 97%   BMI 28.98 kg/m   EXAM: General appearance: alert and no distress Neck: no carotid bruit, no JVD and thyroid not enlarged, symmetric, no tenderness/mass/nodules Lungs: clear to auscultation bilaterally Heart: regular rate and rhythm Abdomen: soft, non-tender; bowel sounds normal; no masses,  no organomegaly Extremities: extremities normal, atraumatic, no cyanosis or edema Pulses: 2+ and symmetric Skin: Skin color, texture, turgor normal. No rashes or lesions Neurologic: Grossly normal Psych: Pleasant  EKG:  Deferred  ASSESSMENT: 1. Mixed dyslipidemia, goal LDL <70 2. Relative statin intolerance 3. CAD with recent NSTEMI and PCI to the RCA (10/2018)  PLAN: 1.   Ms. Albu had recent NSTEMI with PCI to the right coronary artery.  Her target LDL is less than 70 and she remained above that.  She is currently taking atorvastatin 10 mg daily which is her max tolerated dose.  She was intolerant of all other statins.  I would recommend we repeat her lipids today and target additional therapy based on those numbers.  She might be a candidate to add ezetimibe or we could consider a PCSK9 inhibitor.  Also her triglycerides have been elevated and she could also be a candidate for Vascepa.  Will reevaluate the options with her once we see her labs.  Plan follow-up with me in about 3 months and repeat lipids at that time.  Thanks again for the kind referral.  Pixie Casino, MD, FACC, [place] Director of the [facility]  [facility]ovascular [facility] Reduction [facility] Diplomate of the American Board of Clinical Lipidology Attending Cardiologist  Direct Dial: [phone]  Fax: [phone]  Website:  [email or web address] Jaryah Aracena 01/19/2019, 9:01 AM

## 2019-01-19 NOTE — Patient Instructions (Signed)
Medication Instructions:  Your physician recommends that you continue on your current medications as directed. Please refer to the Current Medication list given to you today.  Any med changes will be based on your lab results from 01/19/2019  *If you need a refill on your cardiac medications before your next appointment, please call your pharmacy*  Lab Work: FASTING lipid panel today If you have labs (blood work) drawn today and your tests are completely normal, you will receive your results only by: Marland Kitchen MyChart Message (if you have MyChart) OR . A paper copy in the mail If you have any lab test that is abnormal or we need to change your treatment, we will call you to review the results.  Testing/Procedures: NONE  Follow-Up: Dr. Debara [facility] recommends that you schedule a follow up visit with him the in the [place] in 3-4 months.

## 2019-01-20 ENCOUNTER — Encounter: Payer: Self-pay | Admitting: Cardiovascular Disease

## 2019-01-20 ENCOUNTER — Ambulatory Visit (INDEPENDENT_AMBULATORY_CARE_PROVIDER_SITE_OTHER): Payer: Medicare Other | Admitting: Cardiovascular Disease

## 2019-01-20 DIAGNOSIS — I214 Non-ST elevation (NSTEMI) myocardial infarction: Secondary | ICD-10-CM | POA: Diagnosis not present

## 2019-01-20 DIAGNOSIS — E782 Mixed hyperlipidemia: Secondary | ICD-10-CM

## 2019-01-20 DIAGNOSIS — I10 Essential (primary) hypertension: Secondary | ICD-10-CM | POA: Diagnosis not present

## 2019-01-20 DIAGNOSIS — I2510 Atherosclerotic heart disease of native coronary artery without angina pectoris: Secondary | ICD-10-CM | POA: Diagnosis not present

## 2019-01-20 NOTE — Patient Instructions (Signed)

## 2019-01-20 NOTE — Progress Notes (Signed)
01/20/2019 KATIELYNN HORAN   17-Jul-1955  [id number]  Primary Physician Biagio Borg, MD Primary Cardiologist: Lorretta Harp MD Lupe Carney, [place]  HPI:  KAYDI KLEY is a 63 y.o. moderately overweight divorced Caucasian female no children who works as a Naval architect.  I first met her during her hospitalization in July for non-STEMI.  Her risk factors include hyperlipidemia intolerant to statin therapy, treated hypertension and family history with a mother who had PCI at age 25.  She is not a smoker.  She had chest pain on 7 3 with positive enzymes.  Coronary CTA showed significant mid to distal dominant RCA stenosis.  I performed radial diagnostic cath on her 10/06/2018 revealing high-grade mid RCA stenosis.  I deployed a synergy 2.5 x 20 mm long drug-eluting stent post dilating with a 2.75 mm noncompliant balloon up to 2.8 mm.  She did have an ostial diagonal branch stenosis with normal LV function.  She was discharged home on 10/07/2018 on aspirin and Brilinta.  Unfortunately she could not tolerate Brilinta and was transitioned to clopidogrel.  She is also apparently statin intolerant and is seeing Dr. [facility] for treatment of hyperlipidemia.  She is currently on low-dose atorvastatin and Zetia and I suspect will eventually be transitioned to [place].   Current Meds  Medication Sig  . aspirin 81 MG tablet Take 81 mg by mouth daily.    Marland Kitchen atorvastatin (LIPITOR) 10 MG tablet Take 10 mg by mouth daily.  . Calcium Citrate (CITRACAL PO) Take 2 tablets by mouth daily.   . Cholecalciferol (VITAMIN D) 2000 UNITS CAPS Take 2,000 Units by mouth daily.  . clopidogrel (PLAVIX) 75 MG tablet Take 1 tablet (75 mg total) by mouth daily.  Marland Kitchen esomeprazole (NEXIUM) 40 MG capsule Take 1 capsule (40 mg total) by mouth daily.  . fish oil-omega-3 fatty acids 1000 MG capsule Take 1 g by mouth daily.   . metoprolol tartrate (LOPRESSOR) 25 MG tablet TAKE 0.5 TABLETS (12.5 MG TOTAL) BY MOUTH 2  (TWO) TIMES DAILY.  . metroNIDAZOLE (METROGEL) 0.75 % gel Apply 1 application topically at bedtime.  . Multiple Vitamin (MULITIVITAMIN WITH MINERALS) TABS Take 1 tablet by mouth daily.  . nitroGLYCERIN (NITROSTAT) 0.4 MG SL tablet Place 1 tablet (0.4 mg total) under the tongue every 5 (five) minutes x 3 doses as needed for chest pain.  . pimecrolimus (ELIDEL) 1 % cream Apply 1 application topically every morning.  . valsartan-hydrochlorothiazide (DIOVAN-HCT) 160-12.5 MG tablet Take 1 tablet by mouth daily.  . vitamin E 400 UNIT capsule Take 400 Units by mouth daily.     Allergies  Allergen Reactions  . Penicillins Hives, Itching and Rash    Did it involve swelling of the face/tongue/throat, SOB, or low BP? Unknown Did it involve sudden or severe rash/hives, skin peeling, or any reaction on the inside of your mouth or nose? Unknown Did you need to seek medical attention at a hospital or doctor's office? Unknown When did it last happen?pt cant recall If all above answers are "NO", may proceed with cephalosporin use.   . Rosuvastatin Other (See Comments)    REACTION: muscle weakness  . Simvastatin Other (See Comments)    REACTION: muscle weakness  . Statins Other (See Comments)    Myalgias and weakness  . Sulfonamide Derivatives Other (See Comments)    Nausea, vomiting, abd pain  . Brilinta [Ticagrelor] Rash    Itchy rash to torso  . Sulfa Antibiotics  Other (See Comments)    Nausea, vomiting, abd pain    Social History   Socioeconomic History  . Marital status: Divorced    Spouse name: Not on file  . Number of children: 0  . Years of education: 2  . Highest education level: High school graduate  Occupational History  . Not on file  Social Needs  . Financial resource strain: Not hard at all  . Food insecurity    Worry: Never true    Inability: Never true  . Transportation needs    Medical: No    Non-medical: No  Tobacco Use  . Smoking status: Former Research scientist (life sciences)  .  Smokeless tobacco: Never Used  . Tobacco comment: quit in 5yr ago  Substance and Sexual Activity  . Alcohol use: Yes    Alcohol/week: 0.0 standard drinks    Comment: occ beer/drink  . Drug use: No  . Sexual activity: Yes    Birth control/protection: Post-menopausal  Lifestyle  . Physical activity    Days per week: 5 days    Minutes per session: 30 min  . Stress: Only a little  Relationships  . Social [place] phone: Three times a week    Gets together: Never    Attends religious service: 1 to 4 times per year    Active member of club or organization: No    Attends meetings of clubs or organizations: Never    Relationship status: Divorced  . Intimate partner violence    Fear of current or ex partner: Patient refused    Emotionally abused: Patient refused    Physically abused: Patient refused    Forced sexual activity: Patient refused  Other Topics Concern  . Not on file  Social History Narrative  . Not on file     Review of Systems: General: negative for chills, fever, night sweats or weight changes.  Cardiovascular: negative for chest pain, dyspnea on exertion, edema, orthopnea, palpitations, paroxysmal nocturnal dyspnea or shortness of breath Dermatological: negative for rash Respiratory: negative for cough or wheezing Urologic: negative for hematuria Abdominal: negative for nausea, vomiting, diarrhea, bright red blood per rectum, melena, or hematemesis Neurologic: negative for visual changes, syncope, or dizziness All other systems reviewed and are otherwise negative except as noted above.    Blood pressure 116/60, pulse 65, temperature 97.7 F (36.5 C), height 5' 3.5" (1.613 m), weight 166 lb (75.3 kg).  General appearance: alert and no distress Neck: no adenopathy, no carotid bruit, no JVD, supple, symmetrical, trachea midline and thyroid not enlarged, symmetric, no tenderness/mass/nodules Lungs: clear to auscultation bilaterally Heart: regular  rate and rhythm, S1, S2 normal, no murmur, click, rub or gallop Extremities: extremities normal, atraumatic, no cyanosis or edema Pulses: 2+ and symmetric Skin: Skin color, texture, turgor normal. No rashes or lesions Neurologic: Alert and oriented X 3, normal strength and tone. Normal symmetric reflexes. Normal coordination and gait  EKG not performed today  ASSESSMENT AND PLAN:   Essential hypertension History of essential hypertension with blood pressure measured today 116/60.  She is on metoprolol, Diovan and hydrochlorothiazide.  HLD (hyperlipidemia) Patient hyperlipidemia intolerant to statin therapy currently on low-dose atorvastatin and Zetia followed [facility].  NSTEMI (non-ST elevated myocardial infarction) ([place] History of CAD status post non-STEMI 10/03/2018 with coronary CTA that showed high-grade mid to distal RCA stenosis.  I performed radial diagnostic cath on her 10/06/2018 revealing a high-grade mid dominant RCA stenosis which I stented with  a synergy 2.5 x 20 mm long drug-eluting stent.  EF was normal.  She did have high-grade disease at the origin of a small diagonal branch.  She is had no residual symptoms.  She was transition from Brilinta to clopidogrel because of intolerance.      Lorretta Harp MD FACP,FACC,FAHA, [facility] 01/20/2019 2:52 PM

## 2019-01-20 NOTE — Assessment & Plan Note (Signed)
History of essential hypertension with blood pressure measured today 116/60.  She is on metoprolol, Diovan and hydrochlorothiazide.

## 2019-01-20 NOTE — Assessment & Plan Note (Signed)
Patient hyperlipidemia intolerant to statin therapy currently on low-dose atorvastatin and Zetia followed by Dr. De[facility] in the lipid clinic.

## 2019-01-20 NOTE — Assessment & Plan Note (Signed)
History of CAD status post non-STEMI 10/03/2018 with coronary CTA that showed high-grade mid to distal RCA stenosis.  I performed radial diagnostic cath on her 10/06/2018 revealing a high-grade mid dominant RCA stenosis which I stented with a synergy 2.5 x 20 mm long drug-eluting stent.  EF was normal.  She did have high-grade disease at the origin of a small diagonal branch.  She is had no residual symptoms.  She was transition from Brilinta to clopidogrel because of intolerance.

## 2019-01-21 ENCOUNTER — Encounter (HOSPITAL_COMMUNITY)
Admission: RE | Admit: 2019-01-21 | Discharge: 2019-01-21 | Disposition: A | Discharge status: Home / Self Care | Payer: Medicare Other | Source: Ambulatory Visit | Attending: Cardiovascular Disease | Admitting: Cardiovascular Disease

## 2019-01-21 ENCOUNTER — Encounter (HOSPITAL_COMMUNITY): Payer: Medicare Other

## 2019-01-21 ENCOUNTER — Other Ambulatory Visit: Payer: Self-pay

## 2019-01-21 DIAGNOSIS — Z955 Presence of coronary angioplasty implant and graft: Secondary | ICD-10-CM

## 2019-01-21 DIAGNOSIS — I214 Non-ST elevation (NSTEMI) myocardial infarction: Secondary | ICD-10-CM | POA: Diagnosis not present

## 2019-01-22 ENCOUNTER — Telehealth: Payer: Self-pay | Admitting: Internal Medicine

## 2019-01-22 DIAGNOSIS — E785 Hyperlipidemia, unspecified: Secondary | ICD-10-CM

## 2019-01-22 DIAGNOSIS — E782 Mixed hyperlipidemia: Secondary | ICD-10-CM

## 2019-01-22 MED ORDER — EZETIMIBE 10 MG PO TABS: 10.0000 mg | ORAL_TABLET | Freq: Every day | ORAL | 3 refills | Status: DC

## 2019-01-22 NOTE — Telephone Encounter (Signed)
Rx(s) sent to pharmacy electronically. Lab ordered - will be mailed

## 2019-01-22 NOTE — Telephone Encounter (Signed)
-----   Message from Pixie Casino, MD sent at 01/19/2019  8:52 PM EDT ----- Lipids improved on current regimen. Trigs at goal. LDL >70. Recommend adding zetia 10 mg daily and continue current dose atorvastatin 10 mg daily. Repeat lipids in 3 months.  Dr. Lemmie Evens

## 2019-01-22 NOTE — Telephone Encounter (Signed)
Rx(s) sent to pharmacy electronically.  

## 2019-01-22 NOTE — Addendum Note (Signed)
Addended by: Fidel Levy on: 01/22/2019 02:18 PM   Modules accepted: Orders

## 2019-01-22 NOTE — Telephone Encounter (Signed)
Patient would like script to be sent to her [facility] mail order.

## 2019-01-23 ENCOUNTER — Encounter (HOSPITAL_COMMUNITY): Payer: Medicare Other

## 2019-01-23 ENCOUNTER — Other Ambulatory Visit: Payer: Self-pay

## 2019-01-23 ENCOUNTER — Encounter (HOSPITAL_COMMUNITY)
Admission: RE | Admit: 2019-01-23 | Discharge: 2019-01-23 | Disposition: A | Discharge status: Home / Self Care | Payer: Medicare Other | Source: Ambulatory Visit | Attending: Cardiovascular Disease | Admitting: Cardiovascular Disease

## 2019-01-23 DIAGNOSIS — I214 Non-ST elevation (NSTEMI) myocardial infarction: Secondary | ICD-10-CM

## 2019-01-23 DIAGNOSIS — Z955 Presence of coronary angioplasty implant and graft: Secondary | ICD-10-CM

## 2019-01-26 ENCOUNTER — Encounter (HOSPITAL_COMMUNITY): Payer: Medicare Other

## 2019-01-26 ENCOUNTER — Encounter (HOSPITAL_COMMUNITY)
Admission: RE | Admit: 2019-01-26 | Discharge: 2019-01-26 | Disposition: A | Discharge status: Home / Self Care | Payer: Medicare Other | Source: Ambulatory Visit | Attending: Cardiovascular Disease | Admitting: Cardiovascular Disease

## 2019-01-26 ENCOUNTER — Other Ambulatory Visit: Payer: Self-pay

## 2019-01-26 DIAGNOSIS — I214 Non-ST elevation (NSTEMI) myocardial infarction: Secondary | ICD-10-CM

## 2019-01-26 DIAGNOSIS — Z955 Presence of coronary angioplasty implant and graft: Secondary | ICD-10-CM

## 2019-01-28 ENCOUNTER — Encounter (HOSPITAL_COMMUNITY): Payer: Medicare Other

## 2019-01-28 ENCOUNTER — Encounter (HOSPITAL_COMMUNITY)
Admission: RE | Admit: 2019-01-28 | Discharge: 2019-01-28 | Disposition: A | Discharge status: Home / Self Care | Payer: Medicare Other | Source: Ambulatory Visit | Attending: Cardiovascular Disease | Admitting: Cardiovascular Disease

## 2019-01-28 ENCOUNTER — Other Ambulatory Visit: Payer: Self-pay

## 2019-01-28 DIAGNOSIS — Z955 Presence of coronary angioplasty implant and graft: Secondary | ICD-10-CM

## 2019-01-28 DIAGNOSIS — I214 Non-ST elevation (NSTEMI) myocardial infarction: Secondary | ICD-10-CM

## 2019-01-30 ENCOUNTER — Other Ambulatory Visit: Payer: Self-pay

## 2019-01-30 ENCOUNTER — Encounter (HOSPITAL_COMMUNITY)
Admission: RE | Admit: 2019-01-30 | Discharge: 2019-01-30 | Disposition: A | Discharge status: Home / Self Care | Payer: Medicare Other | Source: Ambulatory Visit | Attending: Cardiovascular Disease | Admitting: Cardiovascular Disease

## 2019-01-30 ENCOUNTER — Encounter (HOSPITAL_COMMUNITY): Payer: Medicare Other

## 2019-01-30 DIAGNOSIS — I214 Non-ST elevation (NSTEMI) myocardial infarction: Secondary | ICD-10-CM

## 2019-01-30 DIAGNOSIS — Z955 Presence of coronary angioplasty implant and graft: Secondary | ICD-10-CM

## 2019-02-02 ENCOUNTER — Encounter (HOSPITAL_COMMUNITY): Payer: Medicare Other

## 2019-02-02 ENCOUNTER — Encounter (HOSPITAL_COMMUNITY)
Admission: RE | Admit: 2019-02-02 | Discharge: 2019-02-02 | Disposition: A | Discharge status: Home / Self Care | Payer: Medicare Other | Source: Ambulatory Visit | Attending: Cardiovascular Disease | Admitting: Cardiovascular Disease

## 2019-02-02 ENCOUNTER — Other Ambulatory Visit: Payer: Self-pay

## 2019-02-02 DIAGNOSIS — Z955 Presence of coronary angioplasty implant and graft: Secondary | ICD-10-CM

## 2019-02-02 DIAGNOSIS — I214 Non-ST elevation (NSTEMI) myocardial infarction: Secondary | ICD-10-CM | POA: Diagnosis not present

## 2019-02-04 ENCOUNTER — Encounter (HOSPITAL_COMMUNITY): Payer: Medicare Other

## 2019-02-04 ENCOUNTER — Other Ambulatory Visit: Payer: Self-pay

## 2019-02-04 ENCOUNTER — Encounter (HOSPITAL_COMMUNITY)
Admission: RE | Admit: 2019-02-04 | Discharge: 2019-02-04 | Disposition: A | Discharge status: Home / Self Care | Payer: Medicare Other | Source: Ambulatory Visit | Attending: Cardiovascular Disease | Admitting: Cardiovascular Disease

## 2019-02-04 DIAGNOSIS — I214 Non-ST elevation (NSTEMI) myocardial infarction: Secondary | ICD-10-CM | POA: Diagnosis not present

## 2019-02-04 DIAGNOSIS — Z955 Presence of coronary angioplasty implant and graft: Secondary | ICD-10-CM

## 2019-02-06 ENCOUNTER — Encounter (HOSPITAL_COMMUNITY): Payer: Medicare Other

## 2019-02-06 ENCOUNTER — Other Ambulatory Visit: Payer: Self-pay

## 2019-02-06 ENCOUNTER — Encounter (HOSPITAL_COMMUNITY)
Admission: RE | Admit: 2019-02-06 | Discharge: 2019-02-06 | Disposition: A | Discharge status: Home / Self Care | Payer: Medicare Other | Source: Ambulatory Visit | Attending: Cardiovascular Disease | Admitting: Cardiovascular Disease

## 2019-02-06 DIAGNOSIS — I214 Non-ST elevation (NSTEMI) myocardial infarction: Secondary | ICD-10-CM

## 2019-02-06 DIAGNOSIS — Z955 Presence of coronary angioplasty implant and graft: Secondary | ICD-10-CM

## 2019-02-09 ENCOUNTER — Encounter (HOSPITAL_COMMUNITY): Payer: Medicare Other

## 2019-02-09 ENCOUNTER — Telehealth: Payer: Self-pay | Admitting: Cardiovascular Disease

## 2019-02-09 ENCOUNTER — Encounter (HOSPITAL_COMMUNITY)
Admission: RE | Admit: 2019-02-09 | Discharge: 2019-02-09 | Disposition: A | Discharge status: Home / Self Care | Payer: Medicare Other | Source: Ambulatory Visit | Attending: Cardiovascular Disease | Admitting: Cardiovascular Disease

## 2019-02-09 ENCOUNTER — Other Ambulatory Visit: Payer: Self-pay

## 2019-02-09 DIAGNOSIS — I214 Non-ST elevation (NSTEMI) myocardial infarction: Secondary | ICD-10-CM

## 2019-02-09 DIAGNOSIS — Z955 Presence of coronary angioplasty implant and graft: Secondary | ICD-10-CM

## 2019-02-09 NOTE — Telephone Encounter (Signed)
HOLD Vitamin E for now, keep nares moist if possible by use humidifier or saline gel (AYR nasal gel).  May use cotton or gauze soaked in Afrin nasal spray to stop any further bleeding.

## 2019-02-09 NOTE — Telephone Encounter (Signed)
Patient states that she has had 2 nosebleeds this weekend. She was advised to let Dr. Gwenlyn Found know by her PCP's nurse. She stated that the nurse said it may come from the Plavix or the vitamin E. The only thing that has changed recently in the last week and a half is that she has been put on ezetimibe (ZETIA) 10 MG tablet and wonders if that may be a cause. Please advise.

## 2019-02-09 NOTE — Telephone Encounter (Signed)
Called and notified patient of message from PharmD. Patient verbalized understanding.  

## 2019-02-09 NOTE — Progress Notes (Addendum)
Incomplete Session Note  Patient Details  Name: Heather Merritt MRN: [id number] Date of Birth: April 08, 1955 Referring Provider:     [place] from 12/30/2018 in [facility]  Referring Provider  Quay Burow, MD      [name] did not complete her rehab session.  [name] presents to cardiac rehab reporting 2 "bloody nose" events over the weekend. VSS BP 132/70, HR 70, SaO2 96, Temp 96.6. States first occurrence was this past Friday and lasted 45 min and patient required nasal packing by an EMT neighbor. Second occurrence was yesterday. Resolved with "packing" and external pressure. Patient remains compliant daily with her Plavix, vitamin E, and Fish Oil. She denies nasal injury or blowing her nose. She has not reported nose bleeds to her provider. She also states she feels like she has post nasal drip and unsure if it is blood. She is not allowed to exercise today and strongly encouraged to call her medical provider this afternoon for further guidance. Patient verbalized understanding. Will follow up with patient tomorrow via telephone. Jaysean [place] E. Laray Anger, BSN

## 2019-02-09 NOTE — Telephone Encounter (Signed)
Patient stated the first one was on Friday- lasted 45 min-1 hour long.   Sunday morning she had another- she packed it and it stopped within 20 minutes.  Cardiac rehab wanted to make Dr.Berry aware of the bleeding- and did not want her to exercise today. The only changes that have been made is Zetia for Cholesterol by Dr.Hilty.  She also takes Vitamin E- and is on Plavix.   She would like to know if anything different she should do- I did advise patient that I would notify MD and PharmD of this- but if it happened again and she was unable to stop it to let us know.  No bleeding since Sunday morning.

## 2019-02-11 ENCOUNTER — Encounter (HOSPITAL_COMMUNITY): Payer: Medicare Other

## 2019-02-11 ENCOUNTER — Encounter (HOSPITAL_COMMUNITY)
Admission: RE | Admit: 2019-02-11 | Discharge: 2019-02-11 | Disposition: A | Discharge status: Home / Self Care | Payer: Medicare Other | Source: Ambulatory Visit | Attending: Cardiovascular Disease | Admitting: Cardiovascular Disease

## 2019-02-11 ENCOUNTER — Other Ambulatory Visit: Payer: Self-pay

## 2019-02-11 DIAGNOSIS — Z955 Presence of coronary angioplasty implant and graft: Secondary | ICD-10-CM

## 2019-02-11 DIAGNOSIS — I214 Non-ST elevation (NSTEMI) myocardial infarction: Secondary | ICD-10-CM

## 2019-02-12 NOTE — Progress Notes (Signed)
Heather Merritt returned to cardiac rehab and exercised without difficulty.Barnet Pall, RN,BSN 02/12/2019 10:27 AM

## 2019-02-12 NOTE — Progress Notes (Signed)
Cardiac Individual Treatment Plan  Patient Details  Name: Heather Merritt MRN: [id number] Date of Birth: 1956-03-26 Referring Provider:     [facility] from 12/30/2018 in [place]  Referring Provider  Quay Burow, MD      Initial Encounter Date:    CARDIAC REHAB PHASE II ORIENTATION from 12/30/2018 in [place]  Date  12/30/18      Visit Diagnosis: 10/03/18 NSTEMI  10/06/18 DES RCA  Patient's Home Medications on Admission:  Current Outpatient Medications:  .  aspirin 81 MG tablet, Take 81 mg by mouth daily.  , Disp: , Rfl:  .  atorvastatin (LIPITOR) 10 MG tablet, Take 10 mg by mouth daily., Disp: , Rfl:  .  Calcium Citrate (CITRACAL PO), Take 2 tablets by mouth daily. , Disp: , Rfl:  .  Cholecalciferol (VITAMIN D) 2000 UNITS CAPS, Take 2,000 Units by mouth daily., Disp: , Rfl:  .  clopidogrel (PLAVIX) 75 MG tablet, Take 1 tablet (75 mg total) by mouth daily., Disp: 90 tablet, Rfl: 3 .  esomeprazole (NEXIUM) 40 MG capsule, Take 1 capsule (40 mg total) by mouth daily., Disp: 90 capsule, Rfl: 3 .  ezetimibe (ZETIA) 10 MG tablet, Take 1 tablet (10 mg total) by mouth daily., Disp: 90 tablet, Rfl: 3 .  fish oil-omega-3 fatty acids 1000 MG capsule, Take 1 g by mouth daily. , Disp: , Rfl:  .  metoprolol tartrate (LOPRESSOR) 25 MG tablet, TAKE 0.5 TABLETS (12.5 MG TOTAL) BY MOUTH 2 (TWO) TIMES DAILY., Disp: 30 tablet, Rfl: 1 .  metroNIDAZOLE (METROGEL) 0.75 % gel, Apply 1 application topically at bedtime., Disp: 45 g, Rfl: 0 .  Multiple Vitamin (MULITIVITAMIN WITH MINERALS) TABS, Take 1 tablet by mouth daily., Disp: , Rfl:  .  nitroGLYCERIN (NITROSTAT) 0.4 MG SL tablet, Place 1 tablet (0.4 mg total) under the tongue every 5 (five) minutes x 3 doses as needed for chest pain., Disp: 25 tablet, Rfl: 12 .  pimecrolimus (ELIDEL) 1 % cream, Apply 1 application topically every morning., Disp: 30 g, Rfl: 0 .   valsartan-hydrochlorothiazide (DIOVAN-HCT) 160-12.5 MG tablet, Take 1 tablet by mouth daily., Disp: 90 tablet, Rfl: 3 .  vitamin E 400 UNIT capsule, Take 400 Units by mouth daily., Disp: , Rfl:   Past Medical History: Past Medical History:  Diagnosis Date  . Allergy    SEASONAL  . ANXIETY 10/28/2007  . Arthritis   . CAD (coronary artery disease), native coronary artery    a. Cath 10/06/2018 - S/p DES to [facility]; medical therapy for high grade ostial/pro 2nd digonal   . Cataracts, bilateral    immature  . Chronic back pain   . Coronary artery disease   . DIVERTICULOSIS, COLON 10/28/2007  . Dry skin    red patch on right knee area  . GERD 10/28/2007   takes Prevacid daily  . H/O hiatal hernia   . HLD (hyperlipidemia) 12/12/2017  . Hx of colonic polyps   . HYPERLIPIDEMIA 10/28/2007   unable to take meds d/t allergies  . HYPERTENSION 10/28/2007   takes Diovan daily  . Internal hemorrhoids   . Irritable bowel syndrome (IBS)   . Joint pain   . Joint swelling   . LIVER FUNCTION TESTS, ABNORMAL, HX OF 10/29/2007  . Lumbar disc disease   . Migraine   . Myocardial infarction ([place])   . Obesity   . OSTEOPENIA 10/29/2007  . Osteoporosis   .  Presbyacusis 01/04/2010  . Rosacea   . Seasonal allergies    takes Clarinex daily  . Vertigo    hx of  . Vitamin D deficiency    takes Vit D daily    Tobacco Use: Social History   Tobacco Use  Smoking Status Former Smoker  Smokeless Tobacco Never Used  Tobacco Comment   quit in 8yrs ago    Labs: Recent Chemical engineer    Labs for ITP Cardiac and Pulmonary Rehab Latest Ref Rng & Units 05/31/2016 06/05/2017 10/04/2018 10/15/2018 01/19/2019   Cholestrol 100 - 199 mg/dL 288(H) 287(H) 204(H) - 180   LDLCALC 0 - 99 mg/dL - - 112(H) - 113(H)   LDLDIRECT mg/dL 181.0 177.0 - - -   HDL >39 mg/dL 40.40 45.40 36(L) - 44   Trlycerides 0 - 149 mg/dL 208.0(H) 201.0(H) 280(H) - 129   Hemoglobin A1c 4.0 - 5.6 % - - - 5.2 -      Capillary Blood  Glucose: No results found for: GLUCAP   Exercise Target Goals: Exercise Program Goal: Individual exercise prescription set using results from initial 6 min walk test and THRR while considering  patient's activity barriers and safety.   Exercise Prescription Goal: Starting with aerobic activity 30 plus minutes a day, 3 days per week for initial exercise prescription. Provide home exercise prescription and guidelines that participant acknowledges understanding prior to discharge.  Activity Barriers & Risk Stratification: Activity Barriers & Cardiac Risk Stratification - 12/30/18 0921      Activity Barriers & Cardiac Risk Stratification   Activity Barriers  Neck/Spine Problems;Back Problems;Balance Concerns;Other (comment)    Comments  Patient has 2 herniated discs (L4 & L5), hx of cervical disc replacement (C3,4, & 5), pain from statin intolerance, and has previously had physical therapy for balance concerns secondary to an inner ear issue.    Cardiac Risk Stratification  High       6 Minute Walk: 6 Minute Walk    Row Name 12/30/18 0809         6 Minute Walk   Phase  Initial     Distance  1506 feet     Walk Time  6 minutes     # of Rest Breaks  0     MPH  2.85     METS  3.23     RPE  11     Perceived Dyspnea   0     VO2 Peak  11.31     Symptoms  No     Resting HR  70 bpm     Resting BP  126/74     Resting Oxygen Saturation   98 %     Exercise Oxygen Saturation  during 6 min walk  99 %     Max Ex. HR  87 bpm     Max Ex. BP  128/78     2 Minute Post BP  120/72        Oxygen Initial Assessment:   Oxygen Re-Evaluation:   Oxygen Discharge (Final Oxygen Re-Evaluation):   Initial Exercise Prescription: Initial Exercise Prescription - 12/30/18 0900      Date of Initial Exercise RX and Referring Provider   Date  12/30/18    Referring Provider  Quay Burow, MD    Expected Discharge Date  02/27/19      Recumbant Bike   Level  3    Watts  30    Minutes  15     METs  3.24      NuStep   Level  3    SPM  85    Minutes  15    METs  3.2      Prescription Details   Frequency (times per week)  3    Duration  Progress to 30 minutes of continuous aerobic without signs/symptoms of physical distress      Intensity   THRR 40-80% of Max Heartrate  62-124    Ratings of Perceived Exertion  11-13    Perceived Dyspnea  0-4      Progression   Progression  Continue to progress workloads to maintain intensity without signs/symptoms of physical distress.      Resistance Training   Training Prescription  Yes    Weight  3lbs    Reps  10-15       Perform Capillary Blood Glucose checks as needed.  Exercise Prescription Changes: Exercise Prescription Changes    Row Name 01/05/19 1503 01/12/19 1502 01/26/19 1504 02/06/19 1502 02/11/19 1503     Response to Exercise   Blood Pressure (Admit)  110/70  106/60  110/64  104/68  106/62   Blood Pressure (Exercise)  140/72  160/72  120/70  122/58  112/60   Blood Pressure (Exit)  104/70  104/64  100/60  100/56  98/70   Heart Rate (Admit)  79 bpm  82 bpm  76 bpm  74 bpm  80 bpm   Heart Rate (Exercise)  101 bpm  111 bpm  102 bpm  108 bpm  103 bpm   Heart Rate (Exit)  78 bpm  86 bpm  75 bpm  82 bpm  81 bpm   Rating of Perceived Exertion (Exercise)  12  12  13  12  12    Symptoms  Chest discomfort  none  none  none  none   Comments  Patient c/o feeling a "twinge" of chest discomfort on the recumbent bie, her 2nd station of exercise.  -  -  -  Decreased pace on machines today after being out with significant nose bleeds this past w/e.   Duration  Progress to 30 minutes of  aerobic without signs/symptoms of physical distress  Progress to 30 minutes of  aerobic without signs/symptoms of physical distress  Progress to 30 minutes of  aerobic without signs/symptoms of physical distress  Progress to 30 minutes of  aerobic without signs/symptoms of physical distress  Progress to 30 minutes of  aerobic without signs/symptoms of  physical distress   Intensity  THRR unchanged  THRR unchanged  THRR unchanged  THRR unchanged  THRR unchanged     Progression   Progression  Continue to progress workloads to maintain intensity without signs/symptoms of physical distress.  Continue to progress workloads to maintain intensity without signs/symptoms of physical distress.  Continue to progress workloads to maintain intensity without signs/symptoms of physical distress.  Continue to progress workloads to maintain intensity without signs/symptoms of physical distress.  Continue to progress workloads to maintain intensity without signs/symptoms of physical distress.   Average METs  2.9  3.1  3.4  3.5  3.2     Resistance Training   Training Prescription  No Exercise stopped on 2nd station because of chest discomfort.  Yes  Yes  Yes  No Relaxation day, no weights.   Weight  -  3lbs  3lbs  3lbs  -   Reps  -  10-15  10-15  10-15  -   Time  -  10 Minutes  10 Minutes  10 Minutes  -     Interval Training   Interval Training  No  No  No  No  No     Recumbant Bike   Level  3  3  3.5  3.5  3.5   Minutes  13  15  15  15  15    METs  2.8  3.1  3  3.4  3     NuStep   Level  3  3  5  4  Decreased workload back down to level 4.0 due to back pain.  4   SPM  85  85  85  85  85   Minutes  15  15  15  15  15    METs  2.9  3.1  3.8  3.6  3.3     Home Exercise Plan   Plans to continue exercise at  -  Home (comment) walking  Home (comment) walking  Home (comment) walking  Home (comment) walking   Frequency  -  Add 3 additional days to program exercise sessions.  Add 3 additional days to program exercise sessions.  Add 3 additional days to program exercise sessions.  Add 3 additional days to program exercise sessions.   Initial Home Exercises Provided  -  01/12/19  01/12/19  01/12/19  01/12/19      Exercise Comments: Exercise Comments    Row Name 01/05/19 1549 01/12/19 1510 01/26/19 1557 01/28/19 1557 02/09/19 1500   Exercise Comments  Patient  started well with exercise. Pt c/o some chest discomfort during her 2nd station, recumbent bike. Exercise stopped, cardiology office contacted.  Reviewed home exericse plan, METs, and goals with patient.  Increased workload on the recumbent stepper. Patient c/o of back pain "5/10" on the pain scale and thigh pain on the stepper. Pt thinks the thigh muscle pain is from taking statin therapy. Pt states back pain resolved on the recumbent bike. Pt wants to try keeping the increase WL on the stepper. Will monitor.  Reviewed METs with patient. Pt was able to exercise at a level 5.0 on the recumbent stepper without back pain. Decreased WL from 5.0 to 4.0 due to "burning" in thighs. Will keep stepper level at 4.0 at this time.  No exercise today due to report of 2 significant nose bleeds over the weekend. Advised to contact physician prior to return to exercise.   [place] Name 02/11/19 1510           Exercise Comments  Patient returned to exercise today after holding exercise on Monday due to significant nose bleeds over the w/e. Patient maintained workloads but decreased workloads on both stations today.          Exercise Goals and Review: Exercise Goals    Row Name 12/30/18 0746             Exercise Goals   Increase Physical Activity  Yes       Intervention  Provide advice, education, support and counseling about physical activity/exercise needs.;Develop an individualized exercise prescription for aerobic and resistive training based on initial evaluation findings, risk stratification, comorbidities and participant's personal goals.       Expected Outcomes  Short Term: Attend rehab on a regular basis to increase amount of physical activity.;Long Term: Exercising regularly at least 3-5 days a week.;Long Term: Add in home exercise to make exercise part of routine and to increase amount of physical activity.       Increase  Strength and Stamina  Yes       Intervention  Provide advice, education, support  and counseling about physical activity/exercise needs.;Develop an individualized exercise prescription for aerobic and resistive training based on initial evaluation findings, risk stratification, comorbidities and participant's personal goals.       Expected Outcomes  Short Term: Increase workloads from initial exercise prescription for resistance, speed, and METs.;Short Term: Perform resistance training exercises routinely during rehab and add in resistance training at home;Long Term: Improve cardiorespiratory fitness, muscular endurance and strength as measured by increased METs and functional capacity (6MWT)       Able to understand and use rate of perceived exertion (RPE) scale  Yes       Intervention  Provide education and explanation on how to use RPE scale       Expected Outcomes  Short Term: Able to use RPE daily in rehab to express subjective intensity level;Long Term:  Able to use RPE to guide intensity level when exercising independently       Knowledge and understanding of Target Heart Rate Range (THRR)  Yes       Intervention  Provide education and explanation of THRR including how the numbers were predicted and where they are located for reference       Expected Outcomes  Short Term: Able to state/look up THRR;Long Term: Able to use THRR to govern intensity when exercising independently;Short Term: Able to use daily as guideline for intensity in rehab       Able to check pulse independently  Yes       Intervention  Provide education and demonstration on how to check pulse in carotid and radial arteries.;Review the importance of being able to check your own pulse for safety during independent exercise       Expected Outcomes  Short Term: Able to explain why pulse checking is important during independent exercise;Long Term: Able to check pulse independently and accurately       Understanding of Exercise Prescription  Yes       Intervention  Provide education, explanation, and written  materials on patient's individual exercise prescription       Expected Outcomes  Short Term: Able to explain program exercise prescription;Long Term: Able to explain home exercise prescription to exercise independently          Exercise Goals Re-Evaluation : Exercise Goals Re-Evaluation    Row Name 01/05/19 1549 01/12/19 1510 02/11/19 1510         Exercise Goal Re-Evaluation   Exercise Goals Review  Increase Physical Activity;Able to understand and use rate of perceived exertion (RPE) scale  Increase Physical Activity;Able to understand and use rate of perceived exertion (RPE) scale;Knowledge and understanding of Target Heart Rate Range (THRR);Understanding of Exercise Prescription  Increase Physical Activity;Able to understand and use rate of perceived exertion (RPE) scale;Knowledge and understanding of Target Heart Rate Range (THRR);Understanding of Exercise Prescription;Increase Strength and Stamina     Comments  Patient able to understand and use RPE scale appropriately. Pt c/o chest discomfort on the recumbent bike, which resolved with rest. Cardiology office contacted and sx's reported.  Reviewed home exercise guidelines with patient including THRR, RPE scale, and endpoints for exercise. Instructed patient on how to manually count pulse, handout given. Pt is walking 35 minutes at home in addition to exercise at cardiac rehab. Pt is also stretching at home, including stretches she got from physical therapy.  Patient states she feels a lot better now than prior to  starting exercise, and pt feels that she's regaining strength in addition to stamina. Pt has some small weights at home and is walking 30-35 minutes, 1-2 days/week in addition to exercise at cardiac rehab. Pt plans to continue walking as her mode of home exericse and is looking into getting home exercise equipment upon completion of the CR program.     Expected Outcomes  Increase workloads as tolerated to help improve cardiorespiratory  fitness. Exercise without symptoms.  Patient will exercise at least 30 minutes, 5-7 days/ week to help achieve personal health and fitness goals.  Patient will exercise 30-35 minutes, at least 4-5 days/week to help increase strength and stamina.         Discharge Exercise Prescription (Final Exercise Prescription Changes): Exercise Prescription Changes - 02/11/19 1503      Response to Exercise   Blood Pressure (Admit)  106/62    Blood Pressure (Exercise)  112/60    Blood Pressure (Exit)  98/70    Heart Rate (Admit)  80 bpm    Heart Rate (Exercise)  103 bpm    Heart Rate (Exit)  81 bpm    Rating of Perceived Exertion (Exercise)  12    Symptoms  none    Comments  Decreased pace on machines today after being out with significant nose bleeds this past w/e.    Duration  Progress to 30 minutes of  aerobic without signs/symptoms of physical distress    Intensity  THRR unchanged      Progression   Progression  Continue to progress workloads to maintain intensity without signs/symptoms of physical distress.    Average METs  3.2      Resistance Training   Training Prescription  No   Relaxation day, no weights.     Interval Training   Interval Training  No      Recumbant Bike   Level  3.5    Minutes  15    METs  3      NuStep   Level  4    SPM  85    Minutes  15    METs  3.3      Home Exercise Plan   Plans to continue exercise at  Home (comment)   walking   Frequency  Add 3 additional days to program exercise sessions.    Initial Home Exercises Provided  01/12/19       Nutrition:  Target Goals: Understanding of nutrition guidelines, daily intake of sodium 1500mg , cholesterol 200mg , calories 30% from fat and 7% or less from saturated fats, daily to have 5 or more servings of fruits and vegetables.  Biometrics: Pre Biometrics - 12/30/18 0746      Pre Biometrics   Height  5' 3.5" (1.613 m)    Weight  76 kg    Waist Circumference  35.5 inches    Hip Circumference  43.5  inches    Waist to Hip Ratio  0.82 %    BMI (Calculated)  29.21    Triceps Skinfold  21 mm    % Body Fat  38.3 %    Grip Strength  35.5 kg    Flexibility  17.25 in    Single Leg Stand  30 seconds        Nutrition Therapy Plan and Nutrition Goals:   Nutrition Assessments:   Nutrition Goals Re-Evaluation:   Nutrition Goals Discharge (Final Nutrition Goals Re-Evaluation):   Psychosocial: Target Goals: Acknowledge presence or absence of significant depression and/or stress,  maximize coping skills, provide positive support system. Participant is able to verbalize types and ability to use techniques and skills needed for reducing stress and depression.  Initial Review & Psychosocial Screening: Initial Psych Review & Screening - 12/30/18 0838      Initial Review   Current issues with  None Identified      Family Dynamics   Good Support System?  Yes    Comments  Patient denies psychosocial barriers to participation in CR. Patient will continue to utilize family, friends and healthcare team for support and resources. She will maintain a positive attitude and participate in healthy stress management if psychosocial barriers arise. She does admit to mild stess secondary to Covid-19 and social isolation however she is able to successfully manage her stress by talking on the phone with family and friends.      Barriers   Psychosocial barriers to participate in program  There are no identifiable barriers or psychosocial needs.      Screening Interventions   Interventions  Encouraged to exercise       Quality of Life Scores: Quality of Life - 12/30/18 0932      Quality of Life   Select  Quality of Life      Quality of Life Scores   Health/Function Pre  26.23 %    Socioeconomic Pre  29.14 %    Psych/Spiritual Pre  27.43 %    Family Pre  26 %    GLOBAL Pre  27.11 %      Scores of 19 and below usually indicate a poorer quality of life in these areas.  A difference of  2-3  points is a clinically meaningful difference.  A difference of 2-3 points in the total score of the Quality of Life Index has been associated with significant improvement in overall quality of life, self-image, physical symptoms, and general health in studies assessing change in quality of life.  PHQ-9: Recent Review Flowsheet Data    Depression screen [facility] 2/9 12/30/2018 10/15/2018 10/15/2018 06/11/2017 06/05/2016   Decreased Interest 0 0 0 0 0   Down, Depressed, Hopeless 0 0 0 0 0   PHQ - 2 Score 0 0 0 0 0   Altered sleeping - - 0 - -   Tired, decreased energy - - 0 - -   Change in appetite - - 0 - -   Feeling bad or failure about yourself  - - 0 - -   Trouble concentrating - - 0 - -   Moving slowly or fidgety/restless - - 0 - -   Suicidal thoughts - - 0 - -   PHQ-9 Score - - 0 - -     Interpretation of Total Score  Total Score Depression Severity:  1-4 = Minimal depression, 5-9 = Mild depression, 10-14 = Moderate depression, 15-19 = Moderately severe depression, 20-27 = Severe depression   Psychosocial Evaluation and Intervention: Psychosocial Evaluation - 01/07/19 1600      Psychosocial Evaluation & Interventions   Interventions  Encouraged to exercise with the program and follow exercise prescription    Comments  There are no psychosocial barriers to participation in CR at this time. Patient admits to having a strong support system including family, friends, and health care providers. She enjoys sewing, photography, and reading which are independent activities that can be done during Covid-19 restrictions. She continues to have a positive attitude and outlook.    Expected Outcomes  Patient will continue to have a  positive attitude and outlook. She will utilize her strong support system if psychosocial barriers arise.       Psychosocial Re-Evaluation: Psychosocial Re-Evaluation    [place] Name 01/15/19 1413 02/12/19 1018           Psychosocial Re-Evaluation   Current issues with   None Identified  None Identified      Comments  Chaylene has not voiced any increased stressors since she has been partcipating in phase 2 cardiac rehab  Kamra has not voiced any increased stressors since she has been partcipating in phase 2 cardiac rehab      Interventions  Encouraged to attend Cardiac Rehabilitation for the exercise  Encouraged to attend Cardiac Rehabilitation for the exercise      Continue Psychosocial Services   No Follow up required  No Follow up required         Psychosocial Discharge (Final Psychosocial Re-Evaluation): Psychosocial Re-Evaluation - 02/12/19 1018      Psychosocial Re-Evaluation   Current issues with  None Identified    Comments  Jacueline has not voiced any increased stressors since she has been partcipating in phase 2 cardiac rehab    Interventions  Encouraged to attend Cardiac Rehabilitation for the exercise    Continue Psychosocial Services   No Follow up required       Vocational Rehabilitation: Provide vocational rehab assistance to qualifying candidates.   Vocational Rehab Evaluation & Intervention: Vocational Rehab - 12/30/18 0837      Initial Vocational Rehab Evaluation & Intervention   Assessment shows need for Vocational Rehabilitation  No       Education: Education Goals: Education classes will be provided on a weekly basis, covering required topics. Participant will state understanding/return demonstration of topics presented.  Learning Barriers/Preferences: Learning Barriers/Preferences - 12/30/18 0759      Learning Barriers/Preferences   Learning Barriers  None    Learning Preferences  Skilled Demonstration;Video;Pictoral       Education Topics: Hypertension, Hypertension Reduction -Define heart disease and high blood pressure. Discus how high blood pressure affects the body and ways to reduce high blood pressure.   Exercise and Your Heart -Discuss why it is important to exercise, the FITT principles of exercise, normal and  abnormal responses to exercise, and how to exercise safely.   Angina -Discuss definition of angina, causes of angina, treatment of angina, and how to decrease risk of having angina.   Cardiac Medications -Review what the following cardiac medications are used for, how they affect the body, and side effects that may occur when taking the medications.  Medications include Aspirin, Beta blockers, calcium channel blockers, ACE Inhibitors, angiotensin receptor blockers, diuretics, digoxin, and antihyperlipidemics.   Congestive Heart Failure -Discuss the definition of CHF, how to live with CHF, the signs and symptoms of CHF, and how keep track of weight and sodium intake.   Heart Disease and Intimacy -Discus the effect sexual activity has on the heart, how changes occur during intimacy as we age, and safety during sexual activity.   Smoking Cessation / COPD -Discuss different methods to quit smoking, the health benefits of quitting smoking, and the definition of COPD.   Nutrition I: Fats -Discuss the types of cholesterol, what cholesterol does to the heart, and how cholesterol levels can be controlled.   Nutrition II: Labels -Discuss the different components of food labels and how to read food label   Heart Parts/Heart Disease and PAD -Discuss the anatomy of the heart, the pathway of blood circulation through  the heart, and these are affected by heart disease.   Stress I: Signs and Symptoms -Discuss the causes of stress, how stress may lead to anxiety and depression, and ways to limit stress.   Stress II: Relaxation -Discuss different types of relaxation techniques to limit stress.   Warning Signs of Stroke / TIA -Discuss definition of a stroke, what the signs and symptoms are of a stroke, and how to identify when someone is having stroke.   Knowledge Questionnaire Score: Knowledge Questionnaire Score - 12/30/18 0933      Knowledge Questionnaire Score   Pre Score  23/24        Core Components/Risk Factors/Patient Goals at Admission: Personal Goals and Risk Factors at Admission - 12/30/18 0933      Core Components/Risk Factors/Patient Goals on Admission    Weight Management  Yes;Weight Loss    Intervention  Weight Management: Provide education and appropriate resources to help participant work on and attain dietary goals.    Admit Weight  167 lb 8.8 oz (76 kg)    Goal Weight: Short Term  161 lb (73 kg)    Goal Weight: Long Term  140 lb (63.5 kg)    Expected Outcomes  Short Term: Continue to assess and modify interventions until short term weight is achieved;Long Term: Adherence to nutrition and physical activity/exercise program aimed toward attainment of established weight goal;Weight Loss: Understanding of general recommendations for a balanced deficit meal plan, which promotes 1-2 lb weight loss per week and includes a negative energy balance of [phone] kcal/d    Hypertension  Yes    Intervention  Provide education on lifestyle modifcations including regular physical activity/exercise, weight management, moderate sodium restriction and increased consumption of fresh fruit, vegetables, and low fat dairy, alcohol moderation, and smoking cessation.;Monitor prescription use compliance.    Expected Outcomes  Short Term: Continued assessment and intervention until BP is < 140/19mm HG in hypertensive participants. < 130/36mm HG in hypertensive participants with diabetes, heart failure or chronic kidney disease.;Long Term: Maintenance of blood pressure at goal levels.    Lipids  Yes    Intervention  Provide education and support for participant on nutrition & aerobic/resistive exercise along with prescribed medications to achieve LDL 70mg , HDL >40mg .    Expected Outcomes  Short Term: Participant states understanding of desired cholesterol values and is compliant with medications prescribed. Participant is following exercise prescription and nutrition guidelines.;Long  Term: Cholesterol controlled with medications as prescribed, with individualized exercise RX and with personalized nutrition plan. Value goals: LDL < 70mg , HDL > 40 mg.       Core Components/Risk Factors/Patient Goals Review:  Goals and Risk Factor Review    Row Name 01/05/19 1637 01/07/19 1600 01/15/19 1414 02/12/19 1021       Core Components/Risk Factors/Patient Goals Review   Personal Goals Review  Weight Management/Obesity;Hypertension;Lipids  Weight Management/Obesity;Hypertension;Lipids  Weight Management/Obesity;Hypertension;Lipids  Weight Management/Obesity;Hypertension;Lipids    Review  Lorien started exercise today had twinge of chest discomfort on the second station  of exercise.  Maloree has multiple CAD risk factors and eager to participate in CR.  Sabian has been doing well with exercise at cardiac rehab. Renada has not had any more chest discomfort since her first day of exercise  Zonnique has been doing well with exercise at cardiac rehab. Jacqualine has not had any more chest discomfort since her first day of exercise    Expected Outcomes  Will continie to monitor for signs and symptoms. Follow exercise guidelines, and  lifestyle modifications for diet and exercise  Patient will continue to participate in CR to modify risk factors and improve her overall health. Her personal goals are to learn exercise limitations secondary to her heart disease and how to safely overcome them. She also would like to loose weight and reduce her risk factors.  Patient will continue to participate in CR to modify risk factors and improve her overall health. Her personal goals are to learn exercise limitations secondary to her heart disease and how to safely overcome them. She also would like to loose weight and reduce her risk factors.  Patient will continue to participate in CR to modify risk factors and improve her overall health. Her personal goals are to learn exercise limitations secondary to her heart disease and  how to safely overcome them. She also would like to loose weight and reduce her risk factors.       Core Components/Risk Factors/Patient Goals at Discharge (Final Review):  Goals and Risk Factor Review - 02/12/19 1021      Core Components/Risk Factors/Patient Goals Review   Personal Goals Review  Weight Management/Obesity;Hypertension;Lipids    Review  Tavin has been doing well with exercise at cardiac rehab. Tareva has not had any more chest discomfort since her first day of exercise    Expected Outcomes  Patient will continue to participate in CR to modify risk factors and improve her overall health. Her personal goals are to learn exercise limitations secondary to her heart disease and how to safely overcome them. She also would like to loose weight and reduce her risk factors.       ITP Comments: ITP Comments    Row Name 12/30/18 0736 01/05/19 1642 01/07/19 1600 01/15/19 1408 02/12/19 1018   ITP Comments  Medical Director- Dr. Fransico Him, MD  30 Day ITP Review. Telma started exercise on 01/05/19  Patient completed her first full day of exercise without complaints  30 Day ITP Review. Anay has good attendance and participation in phase 2 cardiac rehab  30 Day ITP Review. Shontaye has good attendance and participation in phase 2 cardiac rehab      Comments: See ITP comments.Barnet Pall, RN,BSN 02/12/2019 10:32 AM

## 2019-02-13 ENCOUNTER — Encounter (HOSPITAL_COMMUNITY): Payer: Medicare Other

## 2019-02-13 ENCOUNTER — Other Ambulatory Visit: Payer: Self-pay

## 2019-02-13 ENCOUNTER — Encounter (HOSPITAL_COMMUNITY)
Admission: RE | Admit: 2019-02-13 | Discharge: 2019-02-13 | Disposition: A | Discharge status: Home / Self Care | Payer: Medicare Other | Source: Ambulatory Visit | Attending: Cardiovascular Disease | Admitting: Cardiovascular Disease

## 2019-02-13 DIAGNOSIS — Z955 Presence of coronary angioplasty implant and graft: Secondary | ICD-10-CM

## 2019-02-13 DIAGNOSIS — I214 Non-ST elevation (NSTEMI) myocardial infarction: Secondary | ICD-10-CM | POA: Diagnosis not present

## 2019-02-16 ENCOUNTER — Encounter (HOSPITAL_COMMUNITY): Payer: Medicare Other

## 2019-02-16 ENCOUNTER — Other Ambulatory Visit: Payer: Self-pay

## 2019-02-16 ENCOUNTER — Encounter (HOSPITAL_COMMUNITY)
Admission: RE | Admit: 2019-02-16 | Discharge: 2019-02-16 | Disposition: A | Discharge status: Home / Self Care | Payer: Medicare Other | Source: Ambulatory Visit | Attending: Cardiovascular Disease | Admitting: Cardiovascular Disease

## 2019-02-16 DIAGNOSIS — I214 Non-ST elevation (NSTEMI) myocardial infarction: Secondary | ICD-10-CM | POA: Diagnosis not present

## 2019-02-16 DIAGNOSIS — Z955 Presence of coronary angioplasty implant and graft: Secondary | ICD-10-CM

## 2019-02-18 ENCOUNTER — Other Ambulatory Visit: Payer: Self-pay

## 2019-02-18 ENCOUNTER — Encounter (HOSPITAL_COMMUNITY)
Admission: RE | Admit: 2019-02-18 | Discharge: 2019-02-18 | Disposition: A | Discharge status: Home / Self Care | Payer: Medicare Other | Source: Ambulatory Visit | Attending: Cardiovascular Disease | Admitting: Cardiovascular Disease

## 2019-02-18 ENCOUNTER — Encounter (HOSPITAL_COMMUNITY): Payer: Medicare Other

## 2019-02-18 VITALS — Ht 63.5 in | Wt 168.7 lb

## 2019-02-18 DIAGNOSIS — Z955 Presence of coronary angioplasty implant and graft: Secondary | ICD-10-CM

## 2019-02-18 DIAGNOSIS — I214 Non-ST elevation (NSTEMI) myocardial infarction: Secondary | ICD-10-CM | POA: Diagnosis not present

## 2019-02-20 ENCOUNTER — Encounter (HOSPITAL_COMMUNITY): Payer: Medicare Other

## 2019-02-20 ENCOUNTER — Encounter (HOSPITAL_COMMUNITY)
Admission: RE | Admit: 2019-02-20 | Discharge: 2019-02-20 | Disposition: A | Discharge status: Home / Self Care | Payer: Medicare Other | Source: Ambulatory Visit | Attending: Cardiovascular Disease | Admitting: Cardiovascular Disease

## 2019-02-20 ENCOUNTER — Other Ambulatory Visit: Payer: Self-pay

## 2019-02-20 DIAGNOSIS — Z955 Presence of coronary angioplasty implant and graft: Secondary | ICD-10-CM

## 2019-02-20 DIAGNOSIS — I214 Non-ST elevation (NSTEMI) myocardial infarction: Secondary | ICD-10-CM | POA: Diagnosis not present

## 2019-02-20 NOTE — Progress Notes (Signed)
Walk test on 02/18/2019 @ [place], [place], [facility]

## 2019-02-23 ENCOUNTER — Encounter (HOSPITAL_COMMUNITY)
Admission: RE | Admit: 2019-02-23 | Discharge: 2019-02-23 | Disposition: A | Discharge status: Home / Self Care | Payer: Medicare Other | Source: Ambulatory Visit | Attending: Cardiovascular Disease | Admitting: Cardiovascular Disease

## 2019-02-23 ENCOUNTER — Encounter (HOSPITAL_COMMUNITY): Payer: Medicare Other

## 2019-02-25 ENCOUNTER — Encounter (HOSPITAL_COMMUNITY): Payer: Medicare Other

## 2019-02-27 ENCOUNTER — Encounter (HOSPITAL_COMMUNITY): Payer: Medicare Other

## 2019-03-02 ENCOUNTER — Encounter (HOSPITAL_COMMUNITY): Payer: Medicare Other

## 2019-03-02 ENCOUNTER — Encounter (HOSPITAL_COMMUNITY)
Admission: RE | Admit: 2019-03-02 | Discharge: 2019-03-02 | Disposition: A | Discharge status: Home / Self Care | Payer: Medicare Other | Source: Ambulatory Visit | Attending: Cardiovascular Disease | Admitting: Cardiovascular Disease

## 2019-03-02 ENCOUNTER — Other Ambulatory Visit: Payer: Self-pay

## 2019-03-02 DIAGNOSIS — I214 Non-ST elevation (NSTEMI) myocardial infarction: Secondary | ICD-10-CM

## 2019-03-02 DIAGNOSIS — Z955 Presence of coronary angioplasty implant and graft: Secondary | ICD-10-CM

## 2019-03-04 ENCOUNTER — Encounter (HOSPITAL_COMMUNITY): Payer: Medicare Other

## 2019-03-04 ENCOUNTER — Other Ambulatory Visit: Payer: Self-pay

## 2019-03-04 ENCOUNTER — Encounter (HOSPITAL_COMMUNITY)
Admission: RE | Admit: 2019-03-04 | Discharge: 2019-03-04 | Disposition: A | Discharge status: Home / Self Care | Payer: Medicare Other | Source: Ambulatory Visit | Attending: Cardiovascular Disease | Admitting: Cardiovascular Disease

## 2019-03-04 DIAGNOSIS — Z955 Presence of coronary angioplasty implant and graft: Secondary | ICD-10-CM | POA: Insufficient documentation

## 2019-03-04 DIAGNOSIS — I214 Non-ST elevation (NSTEMI) myocardial infarction: Secondary | ICD-10-CM | POA: Insufficient documentation

## 2019-03-06 ENCOUNTER — Other Ambulatory Visit: Payer: Self-pay

## 2019-03-06 ENCOUNTER — Encounter (HOSPITAL_COMMUNITY): Payer: Medicare Other

## 2019-03-06 ENCOUNTER — Encounter (HOSPITAL_COMMUNITY)
Admission: RE | Admit: 2019-03-06 | Discharge: 2019-03-06 | Disposition: A | Discharge status: Home / Self Care | Payer: Medicare Other | Source: Ambulatory Visit | Attending: Cardiovascular Disease | Admitting: Cardiovascular Disease

## 2019-03-06 VITALS — BP 110/62 | HR 79 | Temp 98.1°F | Ht 63.5 in | Wt 170.2 lb

## 2019-03-06 DIAGNOSIS — I214 Non-ST elevation (NSTEMI) myocardial infarction: Secondary | ICD-10-CM

## 2019-03-06 DIAGNOSIS — Z955 Presence of coronary angioplasty implant and graft: Secondary | ICD-10-CM

## 2019-03-06 NOTE — Progress Notes (Signed)
Discharge Progress Report  Patient Details  Name: Heather Merritt MRN: [id number] Date of Birth: 15-Mar-1956 Referring Provider:     [facility] from 12/30/2018 in [place]  Referring Provider  Quay Burow, MD       Number of Visits: 23  Reason for Discharge:  Patient reached a stable level of exercise. Patient independent in their exercise. Patient has met program and personal goals.  Smoking History:  Social History   Tobacco Use  Smoking Status Former Smoker  Smokeless Tobacco Never Used  Tobacco Comment   quit in 19yr ago    Diagnosis:  10/03/18 NSTEMI  10/06/18 DES RCA  ADL [facility]:   Initial Exercise Prescription: Initial Exercise Prescription - 12/30/18 0900      Date of Initial Exercise RX and Referring Provider   Date  12/30/18    Referring Provider  BQuay Burow MD    Expected Discharge Date  02/27/19      Recumbant Bike   Level  3    Watts  30    Minutes  15    METs  3.24      NuStep   Level  3    SPM  85    Minutes  15    METs  3.2      Prescription Details   Frequency (times per week)  3    Duration  Progress to 30 minutes of continuous aerobic without signs/symptoms of physical distress      Intensity   THRR 40-80% of Max Heartrate  62-124    Ratings of Perceived Exertion  11-13    Perceived Dyspnea  0-4      Progression   Progression  Continue to progress workloads to maintain intensity without signs/symptoms of physical distress.      Resistance Training   Training Prescription  Yes    Weight  3lbs    Reps  10-15       Discharge Exercise Prescription (Final Exercise Prescription Changes): Exercise Prescription Changes - 03/06/19 1504      Response to Exercise   Blood Pressure (Admit)  110/62    Blood Pressure (Exercise)  128/62    Blood Pressure (Exit)  106/64    Heart Rate (Admit)  79 bpm    Heart Rate (Exercise)  111 bpm    Heart Rate (Exit)  82 bpm    Rating  of Perceived Exertion (Exercise)  11    Symptoms  none    Duration  Progress to 30 minutes of  aerobic without signs/symptoms of physical distress    Intensity  THRR unchanged      Progression   Progression  Continue to progress workloads to maintain intensity without signs/symptoms of physical distress.    Average METs  3.7      Resistance Training   Training Prescription  Yes    Weight  3lbs    Reps  10-15    Time  10 Minutes      Interval Training   Interval Training  No      Recumbant Bike   Level  3.5    Minutes  15    METs  3.2      NuStep   Level  5    SPM  85    Minutes  15    METs  4.2      Home Exercise Plan   Plans to continue exercise at  Home (comment)  walking   Frequency  Add 3 additional days to program exercise sessions.    Initial Home Exercises Provided  01/12/19       Functional Capacity: 6 Minute Walk    Row Name 12/30/18 0809 02/18/19 0809       6 Minute Walk   Phase  Initial  Discharge    Distance  1506 feet  1506 feet    Distance % Change  -  5.18 %    Distance Feet Change  -  78 ft    Walk Time  6 minutes  6 minutes    # of Rest Breaks  0  0    MPH  2.85  3    METS  3.23  3.57    RPE  11  9    Perceived Dyspnea   0  0    VO2 Peak  11.31  12.51    Symptoms  No  No    Resting HR  70 bpm  71 bpm    Resting BP  126/74  122/72    Resting Oxygen Saturation   98 %  -    Exercise Oxygen Saturation  during 6 min walk  99 %  -    Max Ex. HR  87 bpm  99 bpm    Max Ex. BP  128/78  142/62    2 Minute Post BP  120/72  102/60       Psychological, QOL, Others - Outcomes: PHQ 2/9: Depression screen [facility] 2/9 03/10/2019 12/30/2018 10/15/2018 10/15/2018 06/11/2017  Decreased Interest 0 0 0 0 0  Down, Depressed, Hopeless 0 0 0 0 0  PHQ - 2 Score 0 0 0 0 0  Altered sleeping - - - 0 -  Tired, decreased energy - - - 0 -  Change in appetite - - - 0 -  Feeling bad or failure about yourself  - - - 0 -  Trouble concentrating - - - 0 -  Moving slowly  or fidgety/restless - - - 0 -  Suicidal thoughts - - - 0 -  PHQ-9 Score - - - 0 -    Quality of Life: Quality of Life - 02/20/19 0733      Quality of Life   Select  Quality of Life      Quality of Life Scores   Health/Function Pre  26.23 %    Health/Function Post  27.86 %    Health/Function % Change  6.21 %    Socioeconomic Pre  29.14 %    Socioeconomic Post  29.14 %    Socioeconomic % Change   0 %    Psych/Spiritual Pre  27.43 %    Psych/Spiritual Post  27.43 %    Psych/Spiritual % Change  0 %    Family Pre  26 %    Family Post  26 %    Family % Change  0 %    GLOBAL Pre  27.11 %    GLOBAL Post  27.87 %    GLOBAL % Change  2.8 %       Personal Goals: Goals established at orientation with interventions provided to work toward goal. Personal Goals and Risk Merritt at Admission - 12/30/18 0933      Core Components/Risk Merritt/Patient Goals on Admission    Weight Management  Yes;Weight Loss    Intervention  Weight Management: Provide education and appropriate resources to help participant work on and attain dietary goals.  Admit Weight  167 lb 8.8 oz (76 kg)    Goal Weight: Short Term  161 lb (73 kg)    Goal Weight: Long Term  140 lb (63.5 kg)    Expected Outcomes  Short Term: Continue to assess and modify interventions until short term weight is achieved;Long Term: Adherence to nutrition and physical activity/exercise program aimed toward attainment of established weight goal;Weight Loss: Understanding of general recommendations for a balanced deficit meal plan, which promotes 1-2 lb weight loss per week and includes a negative energy balance of [phone] kcal/d    Hypertension  Yes    Intervention  Provide education on lifestyle modifcations including regular physical activity/exercise, weight management, moderate sodium restriction and increased consumption of fresh fruit, vegetables, and low fat dairy, alcohol moderation, and smoking cessation.;Monitor prescription use  compliance.    Expected Outcomes  Short Term: Continued assessment and intervention until BP is < 140/16m HG in hypertensive participants. < 130/892mHG in hypertensive participants with diabetes, heart failure or chronic kidney disease.;Long Term: Maintenance of blood pressure at goal levels.    Lipids  Yes    Intervention  Provide education and support for participant on nutrition & aerobic/resistive exercise along with prescribed medications to achieve LDL <7027mHDL >74m77m  Expected Outcomes  Short Term: Participant states understanding of desired cholesterol values and is compliant with medications prescribed. Participant is following exercise prescription and nutrition guidelines.;Long Term: Cholesterol controlled with medications as prescribed, with individualized exercise RX and with personalized nutrition plan. Value goals: LDL < 70mg71mL > 40 mg.        Personal Goals Discharge: Goals and Risk Factor Review    Row Name 01/05/19 1637 01/07/19 1600 01/15/19 1414 02/12/19 1021 03/10/19 1546     Core Components/Risk Merritt/Patient Goals Review   Personal Goals Review  Weight Management/Obesity;Hypertension;Lipids  Weight Management/Obesity;Hypertension;Lipids  Weight Management/Obesity;Hypertension;Lipids  Weight Management/Obesity;Hypertension;Lipids  Weight Management/Obesity;Hypertension;Lipids   Review  Heather Merritt started exercise today had twinge of chest discomfort on the second station  of exercise.  Heather Merritt and eager to participate in CR.  KarenManpreetbeen doing well with exercise at cardiac rehab. KarenMarieelenanot had any more chest discomfort since her first day of exercise  KarenEtheleanbeen doing well with exercise at cardiac rehab. Heather Merritt had any more chest discomfort since her first day of exercise  Heather Merritt on 03/06/19. KarenCarlens to continue exercise by walking at home.   Expected Outcomes  Will continie to monitor for signs and symptoms. Follow  exercise guidelines, and lifestyle modifications for diet and exercise  Patient will continue to participate in CR to modify risk Merritt and improve her overall health. Her personal goals are to learn exercise limitations secondary to her heart disease and how to safely overcome them. She also would like to loose weight and reduce her risk Merritt.  Patient will continue to participate in CR to modify risk Merritt and improve her overall health. Her personal goals are to learn exercise limitations secondary to her heart disease and how to safely overcome them. She also would like to loose weight and reduce her risk Merritt.  Patient will continue to participate in CR to modify risk Merritt and improve her overall health. Her personal goals are to learn exercise limitations secondary to her heart disease and how to safely overcome them. She also would like to loose weight and reduce her risk Merritt.  KarenDylan continue to exercise  upon graduation from phase 2 cardiac rehab. Heather Merritt will continue to follow nutrition and lifestyle modifications.      Exercise Goals and Review: Exercise Goals    Row Name 12/30/18 0746             Exercise Goals   Increase Physical Activity  Yes       Intervention  Provide advice, education, support and counseling about physical activity/exercise needs.;Develop an individualized exercise prescription for aerobic and resistive training based on initial evaluation findings, risk stratification, comorbidities and participant's personal goals.       Expected Outcomes  Short Term: Attend rehab on a regular basis to increase amount of physical activity.;Long Term: Exercising regularly at least 3-5 days a week.;Long Term: Add in home exercise to make exercise part of routine and to increase amount of physical activity.       Increase Strength and Stamina  Yes       Intervention  Provide advice, education, support and counseling about physical activity/exercise needs.;Develop  an individualized exercise prescription for aerobic and resistive training based on initial evaluation findings, risk stratification, comorbidities and participant's personal goals.       Expected Outcomes  Short Term: Increase workloads from initial exercise prescription for resistance, speed, and METs.;Short Term: Perform resistance training exercises routinely during rehab and add in resistance training at home;Long Term: Improve cardiorespiratory fitness, muscular endurance and strength as measured by increased METs and functional capacity (6MWT)       Able to understand and use rate of perceived exertion (RPE) scale  Yes       Intervention  Provide education and explanation on how to use RPE scale       Expected Outcomes  Short Term: Able to use RPE daily in rehab to express subjective intensity level;Long Term:  Able to use RPE to guide intensity level when exercising independently       Knowledge and understanding of Target Heart Rate Range (THRR)  Yes       Intervention  Provide education and explanation of THRR including how the numbers were predicted and where they are located for reference       Expected Outcomes  Short Term: Able to state/look up THRR;Long Term: Able to use THRR to govern intensity when exercising independently;Short Term: Able to use daily as guideline for intensity in rehab       Able to check pulse independently  Yes       Intervention  Provide education and demonstration on how to check pulse in carotid and radial arteries.;Review the importance of being able to check your own pulse for safety during independent exercise       Expected Outcomes  Short Term: Able to explain why pulse checking is important during independent exercise;Long Term: Able to check pulse independently and accurately       Understanding of Exercise Prescription  Yes       Intervention  Provide education, explanation, and written materials on patient's individual exercise prescription        Expected Outcomes  Short Term: Able to explain program exercise prescription;Long Term: Able to explain home exercise prescription to exercise independently          Exercise Goals Re-Evaluation: Exercise Goals Re-Evaluation    Row Name 01/05/19 1549 01/12/19 1510 02/11/19 1510 02/18/19 1520 03/06/19 1555     Exercise Goal Re-Evaluation   Exercise Goals Review  Increase Physical Activity;Able to understand and use rate of perceived exertion (RPE) scale  Increase Physical Activity;Able to understand and use rate of perceived exertion (RPE) scale;Knowledge and understanding of Target Heart Rate Range (THRR);Understanding of Exercise Prescription  Increase Physical Activity;Able to understand and use rate of perceived exertion (RPE) scale;Knowledge and understanding of Target Heart Rate Range (THRR);Understanding of Exercise Prescription;Increase Strength and Stamina  Increase Physical Activity;Able to understand and use rate of perceived exertion (RPE) scale;Knowledge and understanding of Target Heart Rate Range (THRR);Understanding of Exercise Prescription;Increase Strength and Stamina;Able to check pulse independently  Increase Physical Activity;Able to understand and use rate of perceived exertion (RPE) scale;Knowledge and understanding of Target Heart Rate Range (THRR);Understanding of Exercise Prescription;Increase Strength and Stamina;Able to check pulse independently   Comments  Patient able to understand and use RPE scale appropriately. Pt c/o chest discomfort on the recumbent bike, which resolved with rest. Cardiology office contacted and sx's reported.  Reviewed home exercise guidelines with patient including THRR, RPE scale, and endpoints for exercise. Instructed patient on how to manually count pulse, handout given. Pt is walking 35 minutes at home in addition to exercise at cardiac rehab. Pt is also stretching at home, including stretches she got from physical therapy.  Patient states she feels  a lot better now than prior to starting exercise, and pt feels that she's regaining strength in addition to stamina. Pt has some small weights at home and is walking 30-35 minutes, 1-2 days/week in addition to exercise at cardiac rehab. Pt plans to continue walking as her mode of home exericse and is looking into getting home exercise equipment upon completion of the CR program.  Patient's functional capacity increased 5% as measured by 6MWT, grip strength measurement decreased 11%, and flexibility increased 4%. Patient feels she's achieved her goal of knowing her limitations. Pt plans to continue walking at least 5 days/week and she has 2 and 5 lbs weights that she plans to use for her resistance training. Pt is looking into purchasing a recumbent bike or stepper for home use. Pt has also purchased a heart monitor to check her pulse independently.  Patient completed the phase 2 cardiac rehab program and progressed well, achieving 3-4 METs with exercise. Pt plans to continue walking and/or exercising to workout videos at least 5 days/week and she has 2 and 5 lbs weights that she plans to use for her resistance training.   Expected Outcomes  Increase workloads as tolerated to help improve cardiorespiratory fitness. Exercise without symptoms.  Patient will exercise at least 30 minutes, 5-7 days/ week to help achieve personal health and fitness goals.  Patient will exercise 30-35 minutes, at least 4-5 days/week to help increase strength and stamina.  Patient will exercise 30-35 minutes, at least 4-5 days/week to help achieve her personal health and fitness goals.  Patient will exercise at least 30 minutes, 5 days/week to maintain health and fitness gains.      Nutrition & Weight - Outcomes: Pre Biometrics - 12/30/18 0746      Pre Biometrics   Height  5' 3.5" (1.613 m)    Weight  76 kg    Waist Circumference  35.5 inches    Hip Circumference  43.5 inches    Waist to Hip Ratio  0.82 %    BMI (Calculated)   29.21    Triceps Skinfold  21 mm    % Body Fat  38.3 %    Grip Strength  35.5 kg    Flexibility  17.25 in    Single Leg Stand  30 seconds  Post Biometrics - 03/06/19 1504       Post  Biometrics   Height  5' 3.5" (1.613 m)    Weight  77.2 kg    Waist Circumference  34.25 inches    Hip Circumference  43.5 inches    Waist to Hip Ratio  0.79 %    BMI (Calculated)  29.67    Triceps Skinfold  23 mm    % Body Fat  38.5 %    Grip Strength  31.5 kg    Flexibility  18 in    Single Leg Stand  30 seconds       Nutrition:   Nutrition Discharge:   Education Questionnaire Score: Knowledge Questionnaire Score - 02/20/19 0734      Knowledge Questionnaire Score   Pre Score  23/24    Post Score  24/24       Goals reviewed with patient; copy given to patient.Pt graduated from cardiac rehab program today with completion of 36 exercise sessions in Phase II. Pt maintained good attendance and progressed nicely during his participation in rehab as evidenced by increased MET level.   Medication list reconciled. Repeat  PHQ score- 0 .  Pt has made significant lifestyle changes and should be commended for her success. Pt feels she has achieved her goals during cardiac rehab.   Pt plans to continue exercise by walking and using her Heather Merritt exercise tapes at home. We are proud of Heather Merritt's progress. Heather Merritt did not lose weight as she had just celebrated the Thanksgiving holiday. Barnet Pall, RN,BSN 03/10/2019 3:51 PM

## 2019-03-09 ENCOUNTER — Encounter (HOSPITAL_COMMUNITY): Payer: Medicare Other

## 2019-03-11 ENCOUNTER — Encounter (HOSPITAL_COMMUNITY): Payer: Medicare Other

## 2019-03-13 ENCOUNTER — Encounter (HOSPITAL_COMMUNITY): Payer: Medicare Other

## 2019-03-16 ENCOUNTER — Encounter (HOSPITAL_COMMUNITY): Payer: Medicare Other

## 2019-03-18 ENCOUNTER — Encounter (HOSPITAL_COMMUNITY): Payer: Medicare Other

## 2019-03-20 ENCOUNTER — Encounter (HOSPITAL_COMMUNITY): Payer: Medicare Other

## 2019-03-23 ENCOUNTER — Encounter (HOSPITAL_COMMUNITY): Payer: Medicare Other

## 2019-03-25 ENCOUNTER — Encounter (HOSPITAL_COMMUNITY): Payer: Medicare Other

## 2019-03-30 ENCOUNTER — Encounter (HOSPITAL_COMMUNITY): Payer: Medicare Other

## 2019-04-01 ENCOUNTER — Encounter (HOSPITAL_COMMUNITY): Payer: Medicare Other

## 2019-04-06 ENCOUNTER — Encounter (HOSPITAL_COMMUNITY): Payer: Medicare Other

## 2019-04-08 ENCOUNTER — Encounter (HOSPITAL_COMMUNITY): Payer: Medicare Other

## 2019-04-10 ENCOUNTER — Encounter (HOSPITAL_COMMUNITY): Payer: Medicare Other

## 2019-04-13 ENCOUNTER — Encounter (HOSPITAL_COMMUNITY): Payer: Medicare Other

## 2019-04-15 ENCOUNTER — Encounter (HOSPITAL_COMMUNITY): Payer: Medicare Other

## 2019-04-17 ENCOUNTER — Ambulatory Visit (INDEPENDENT_AMBULATORY_CARE_PROVIDER_SITE_OTHER): Payer: Medicare Other | Admitting: Internal Medicine

## 2019-04-17 ENCOUNTER — Other Ambulatory Visit: Payer: Self-pay

## 2019-04-17 ENCOUNTER — Encounter: Payer: Self-pay | Admitting: Internal Medicine

## 2019-04-17 VITALS — BP 126/78 | HR 70 | Temp 98.6°F | Ht 63.5 in | Wt 171.0 lb

## 2019-04-17 DIAGNOSIS — E782 Mixed hyperlipidemia: Secondary | ICD-10-CM | POA: Diagnosis not present

## 2019-04-17 DIAGNOSIS — F411 Generalized anxiety disorder: Secondary | ICD-10-CM | POA: Diagnosis not present

## 2019-04-17 DIAGNOSIS — I10 Essential (primary) hypertension: Secondary | ICD-10-CM

## 2019-04-17 DIAGNOSIS — R739 Hyperglycemia, unspecified: Secondary | ICD-10-CM

## 2019-04-17 NOTE — Progress Notes (Addendum)
Subjective:    Patient ID: Heather Merritt, female    DOB: 1956/01/28, 64 y.o.   MRN: [id number]  HPI  Here for yearly f/u;  Overall doing ok;  Pt denies Chest pain, worsening SOB, DOE, wheezing, orthopnea, PND, worsening LE edema, palpitations, dizziness or syncope.  Pt denies neurological change such as new headache, facial or extremity weakness.  Pt denies polydipsia, polyuria, or low sugar symptoms. Pt states overall good compliance with treatment and medications, good tolerability, and has been trying to follow appropriate diet.  Pt denies worsening depressive symptoms, suicidal ideation or panic. No fever, night sweats, wt loss, loss of appetite, or other constitutional symptoms.  Pt states good ability with ADL's, has low fall risk, home safety reviewed and adequate, no other significant changes in hearing or vision, and only occasionally active with exercise. 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 Allergy    SEASONAL  . ANXIETY 10/28/2007  . Arthritis   . CAD (coronary artery disease), native coronary artery    a. Cath 10/06/2018 - S/p DES to [facility]; medical therapy for high grade ostial/pro 2nd digonal   . Cataracts, bilateral    immature  . Chronic back pain   . Coronary artery disease   . DIVERTICULOSIS, COLON 10/28/2007  . Dry skin    red patch on right knee area  . GERD 10/28/2007   takes Prevacid daily  . H/O hiatal hernia   . HLD (hyperlipidemia) 12/12/2017  . Hx of colonic polyps   . HYPERLIPIDEMIA 10/28/2007   unable to take meds d/t allergies  . HYPERTENSION 10/28/2007   takes Diovan daily  . Internal hemorrhoids   . Irritable bowel syndrome (IBS)   . Joint pain   . Joint swelling   . LIVER FUNCTION TESTS, ABNORMAL, HX OF 10/29/2007  . Lumbar disc disease   . Migraine   . Myocardial infarction ([place])   . Obesity   . OSTEOPENIA 10/29/2007  . Osteoporosis   . Presbyacusis 01/04/2010  .  Rosacea   . Seasonal allergies    takes Clarinex daily  . Vertigo    hx of  . Vitamin D deficiency    takes Vit D daily   Past Surgical History:  Procedure Laterality Date  . ANTERIOR CERVICAL DECOMP/DISCECTOMY FUSION  08/02/2011   Procedure: ANTERIOR CERVICAL DECOMPRESSION/DISCECTOMY FUSION 3 LEVELS;  Surgeon: Elaina Hoops, MD;  Location: [place] NEUR[facility] ORS;  Service: Neurosurgery;  Laterality: N/A;  Cervical Three-Four, Cervical Four-Five, Cervical Five-Six  Anterior Cervical Decompression Fusion   . CARDIAC CATHETERIZATION    . COLONOSCOPY  2001/2013  . CORONARY STENT INTERVENTION N/A 10/06/2018   Procedure: CORONARY STENT INTERVENTION;  Surgeon: Lorretta Harp, MD;  Location: [place] [facility];  Service: Cardiovascular;  Laterality: N/A;  . ESOPHAGOGASTRODUODENOSCOPY    . LEFT HEART CATH AND CORONARY ANGIOGRAPHY N/A 10/06/2018   Procedure: LEFT HEART CATH AND CORONARY ANGIOGRAPHY;  Surgeon: Lorretta Harp, MD;  Location: [place] CV LAB;  Service: Cardiovascular;  Laterality: N/A;  . POLYPECTOMY    . TONSILLECTOMY    . TONSILLECTOMY  at age 57    reports that she has quit smoking. She has never used smokeless tobacco. She reports current alcohol use. She reports that she does not use drugs. family history includes Atrial fibrillation in her father and mother; Diabetes in her maternal grandmother; Heart disease in her  mother; Hyperlipidemia in her mother; Hypothyroidism in her maternal grandmother and sister; Stroke in her mother. Allergies  Allergen Reactions  . Penicillins Hives, Itching and Rash    Did it involve swelling of the face/tongue/throat, SOB, or low BP? Unknown Did it involve sudden or severe rash/hives, skin peeling, or any reaction on the inside of your mouth or nose? Unknown Did you need to seek medical attention at a hospital or doctor's office? Unknown When did it last happen?pt cant recall If all above answers are "NO", may proceed with cephalosporin use.    . Rosuvastatin Other (See Comments)    REACTION: muscle weakness  . Simvastatin Other (See Comments)    REACTION: muscle weakness  . Statins Other (See Comments)    Myalgias and weakness  . Sulfonamide Derivatives Other (See Comments)    Nausea, vomiting, abd pain  . Brilinta [Ticagrelor] Rash    Itchy rash to torso  . Sulfa Antibiotics Other (See Comments)    Nausea, vomiting, abd pain   Current Outpatient Medications on File Prior to Visit  Medication Sig Dispense Refill  . aspirin 81 MG tablet Take 81 mg by mouth daily.      Marland Kitchen atorvastatin (LIPITOR) 10 MG tablet Take 10 mg by mouth daily.    . Calcium Citrate (CITRACAL PO) Take 2 tablets by mouth daily.     . Cholecalciferol (VITAMIN D) 2000 UNITS CAPS Take 2,000 Units by mouth daily.    . clopidogrel (PLAVIX) 75 MG tablet Take 1 tablet (75 mg total) by mouth daily. 90 tablet 3  . esomeprazole (NEXIUM) 40 MG capsule Take 1 capsule (40 mg total) by mouth daily. 90 capsule 3  . ezetimibe (ZETIA) 10 MG tablet Take 1 tablet (10 mg total) by mouth daily. 90 tablet 3  . fish oil-omega-3 fatty acids 1000 MG capsule Take 1 g by mouth daily.     . metoprolol tartrate (LOPRESSOR) 25 MG tablet TAKE 0.5 TABLETS (12.5 MG TOTAL) BY MOUTH 2 (TWO) TIMES DAILY. 30 tablet 1  . metroNIDAZOLE (METROGEL) 0.75 % gel Apply 1 application topically at bedtime. 45 g 0  . Multiple Vitamin (MULITIVITAMIN WITH MINERALS) TABS Take 1 tablet by mouth daily.    . nitroGLYCERIN (NITROSTAT) 0.4 MG SL tablet Place 1 tablet (0.4 mg total) under the tongue every 5 (five) minutes x 3 doses as needed for chest pain. 25 tablet 12  . pimecrolimus (ELIDEL) 1 % cream Apply 1 application topically every morning. 30 g 0  . valsartan-hydrochlorothiazide (DIOVAN-HCT) 160-12.5 MG tablet Take 1 tablet by mouth daily. 90 tablet 3  . vitamin E 400 UNIT capsule Take 400 Units by mouth daily.     No current facility-administered medications on file prior to visit.   Review of  Systems  Constitutional: Negative for other unusual diaphoresis or sweats HENT: Negative for ear discharge or swelling Eyes: Negative for other worsening visual disturbances Respiratory: Negative for stridor or other swelling  Gastrointestinal: Negative for worsening distension or other blood Genitourinary: Negative for retention or other urinary change Musculoskeletal: Negative for other MSK pain or swelling Skin: Negative for color change or other new lesions Neurological: Negative for worsening tremors and other numbness  Psychiatric/Behavioral: Negative for worsening agitation or other fatigue All otherwise neg per pt     Objective:   Physical Exam BP 126/78   Pulse 70   Temp 98.6 F (37 C) (Oral)   Ht 5' 3.5" (1.613 m)   Wt 171 lb (77.6 kg)  SpO2 97%   BMI 29.82 kg/m  VS noted,  Constitutional: Pt appears in NAD HENT: Head: NCAT.  Right Ear: External ear normal.  Left Ear: External ear normal.  Eyes: . Pupils are equal, round, and reactive to light. Conjunctivae and EOM are normal Nose: without d/c or deformity Neck: Neck supple. Gross normal ROM Cardiovascular: Normal rate and regular rhythm.   Pulmonary/Chest: Effort normal and breath sounds without rales or wheezing.  Abd:  Soft, NT, ND, + BS, no organomegaly Neurological: Pt is alert. At baseline orientation, motor grossly intact Skin: Skin is warm. No rashes, other new lesions, no LE edema Psychiatric: Pt behavior is normal without agitation  All otherwise neg per pt  Declines new labs today Lab Results  Component Value Date   WBC 4.9 10/07/2018   HGB 12.2 10/07/2018   HCT 36.5 10/07/2018   PLT 213 10/07/2018   GLUCOSE 111 (H) 10/07/2018   CHOL 180 01/19/2019   TRIG 129 01/19/2019   HDL 44 01/19/2019   LDLDIRECT 177.0 06/05/2017   LDLCALC 113 (H) 01/19/2019   ALT 26 10/03/2018   AST 24 10/03/2018   NA 141 10/07/2018   K 4.1 10/07/2018   CL 105 10/07/2018   CREATININE 0.79 10/07/2018   BUN 13  10/07/2018   CO2 28 10/07/2018   TSH 1.86 06/05/2017   INR 0.9 10/03/2018   HGBA1C 5.2 10/15/2018        Assessment & Plan:

## 2019-04-18 ENCOUNTER — Encounter: Payer: Self-pay | Admitting: Internal Medicine

## 2019-04-18 NOTE — Patient Instructions (Signed)
Please continue all other medications as before, and refills have been done if requested.  Please have the pharmacy call with any other refills you may need.  Please continue your efforts at being more active, low cholesterol diet, and weight control.  You are otherwise up to date with prevention measures today.  Please keep your appointments with your specialists as you may have planned  Please make an Appointment to return for your 1 year visit, or sooner if needed 

## 2019-04-18 NOTE — Assessment & Plan Note (Signed)
stable overall by history and exam, recent data reviewed with pt, and pt to continue medical treatment as before,  to f/u any worsening symptoms or concerns  

## 2019-04-18 NOTE — Assessment & Plan Note (Addendum)
Uncontrolled, to cont follow lipid clinic, cont same tx

## 2019-04-20 ENCOUNTER — Encounter (HOSPITAL_COMMUNITY): Payer: Medicare Other

## 2019-04-22 ENCOUNTER — Encounter (HOSPITAL_COMMUNITY): Payer: Medicare Other

## 2019-04-24 ENCOUNTER — Encounter (HOSPITAL_COMMUNITY): Payer: Medicare Other

## 2019-04-26 ENCOUNTER — Ambulatory Visit: Payer: Medicare Other | Attending: Internal Medicine

## 2019-04-26 DIAGNOSIS — Z23 Encounter for immunization: Secondary | ICD-10-CM | POA: Insufficient documentation

## 2019-04-26 NOTE — Progress Notes (Signed)
   Covid-19 Vaccination Clinic  Name:  Heather Merritt    MRN: [id number] DOB: [birth year]  04/26/2019  [name] was observed post Covid-19 immunization for 15 minutes without incidence. She was provided with Vaccine Information Sheet and instruction to access the V-Safe system.   [name] was instructed to call 911 with any severe reactions post vaccine: Marland Kitchen Difficulty breathing  . Swelling of your face and throat  . A fast heartbeat  . A bad rash all over your body  . Dizziness and weakness    Immunizations Administered    Name Date Dose VIS Date Route   Pfizer COVID-19 Vaccine 04/26/2019  3:21 PM 0.3 mL 03/13/2019 Intramuscular   Manufacturer: [place]   Lot: [id number]   [place]: [id number]

## 2019-05-01 LAB — HEPATIC FUNCTION PANEL
ALT: 34 IU/L — ABNORMAL HIGH (ref 0–32)
AST: 24 IU/L (ref 0–40)
Albumin: 4.5 g/dL (ref 3.8–4.8)
Alkaline Phosphatase: 72 IU/L (ref 39–117)
Bilirubin Total: 0.6 mg/dL (ref 0.0–1.2)
Bilirubin, Direct: 0.15 mg/dL (ref 0.00–0.40)
Total Protein: 6.9 g/dL (ref 6.0–8.5)

## 2019-05-01 LAB — LIPID PANEL
Chol/HDL Ratio: 3.3 ratio (ref 0.0–4.4)
Cholesterol, Total: 158 mg/dL (ref 100–199)
HDL: 48 mg/dL (ref >39)
LDL Chol Calc (NIH): 81 mg/dL (ref 0–99)
Triglycerides: 167 mg/dL — ABNORMAL HIGH (ref 0–149)
VLDL Cholesterol Cal: 29 mg/dL (ref 5–40)

## 2019-05-08 ENCOUNTER — Ambulatory Visit (INDEPENDENT_AMBULATORY_CARE_PROVIDER_SITE_OTHER): Payer: Medicare Other | Admitting: Internal Medicine

## 2019-05-08 ENCOUNTER — Other Ambulatory Visit: Payer: Self-pay

## 2019-05-08 ENCOUNTER — Encounter: Payer: Self-pay | Admitting: Internal Medicine

## 2019-05-08 VITALS — BP 136/77 | HR 73 | Ht 63.5 in | Wt 170.8 lb

## 2019-05-08 DIAGNOSIS — E785 Hyperlipidemia, unspecified: Secondary | ICD-10-CM

## 2019-05-08 DIAGNOSIS — M791 Myalgia, unspecified site: Secondary | ICD-10-CM

## 2019-05-08 DIAGNOSIS — I10 Essential (primary) hypertension: Secondary | ICD-10-CM

## 2019-05-08 DIAGNOSIS — T466X5A Adverse effect of antihyperlipidemic and antiarteriosclerotic drugs, initial encounter: Secondary | ICD-10-CM

## 2019-05-08 MED ORDER — ICOSAPENT ETHYL 1 G PO CAPS: 2.0000 g | ORAL_CAPSULE | Freq: Two times a day (BID) | ORAL | 3 refills | Status: DC

## 2019-05-08 MED ORDER — ATORVASTATIN CALCIUM 10 MG PO TABS: 10.0000 mg | ORAL_TABLET | Freq: Every day | ORAL | 3 refills | Status: DC

## 2019-05-08 NOTE — Patient Instructions (Signed)
Medication Instructions:  STOP over-the-counter fish oil START vascepa 2 capsules twice daily CONTINUE current therapy   *If you need a refill on your cardiac medications before your next appointment, please call your pharmacy*  Lab Work: FASTING lab work in 3-4 months (before next lipid clinic appointment) If you have labs (blood work) drawn today and your tests are completely normal, you will receive your results only by: Marland Kitchen MyChart Message (if you have MyChart) OR . A paper copy in the mail If you have any lab test that is abnormal or we need to change your treatment, we will call you to review the results.  Testing/Procedures: NONE  Follow-Up: At [facility], you and your health needs are our priority.  As part of our continuing mission to provide you with exceptional heart care, we have created designated Provider Care Teams.  These Care Teams include your primary Cardiologist (physician) and Advanced Practice Providers (APPs -  Physician Assistants and Nurse Practitioners) who all work together to provide you with the care you need, when you need it.  Your next appointment:   3-4 month(s)  The format for your next appointment:   Either In Person or Virtual  Provider:   Raliegh Ip Mali Hilty, MD  Other Instructions

## 2019-05-08 NOTE — Progress Notes (Signed)
LIPID CLINIC CONSULT NOTE  Chief Complaint:  Follow-up dyslipidemia  Primary Care Physician: Biagio Borg, Heather Merritt  Primary Cardiologist:  Quay Burow, Heather Merritt  HPI:  Heather Merritt is a 64 y.o. female who is being seen today for the evaluation of dyslipidemia at the request of Biagio Borg, Heather Merritt. This is a 64 year old female patient of Dr. Alvester Chou who was referred by Almyra Deforest, PA-C, for management of dyslipidemia.  Unfortunately she recently had chest pain was found to have a non-STEMI.  Cath in July 2020 demonstrated some coronary disease specifically in the right coronary artery requiring a drug-eluting stent placement.  Since then she has done very well without recurrent chest pain.  She started cardiac rehabilitation.  She does have a longstanding history of dyslipidemia including family history of coronary disease.  She had previously been on a number of different statin medications including atorvastatin, rosuvastatin, simvastatin, pravastatin and others with significant myalgias, particularly at the higher doses.  She had been taking atorvastatin 10 mg every other day prior to this event and was convinced to take it on a daily basis afterwards.  So far that seems to be reasonably well tolerated with some mild myalgias.  She has not had reassessment of her cholesterol since July however her total cholesterol the time was 204, triglycerides 280, HDL 36 and LDL of 112.  05/08/2019  Heather Merritt returns today for follow-up.  Overall she is doing fairly well.  Her cholesterol has improved on the Zetia.  LDL is down to 81 from 113.  Total cholesterol is 158 with triglycerides increased to 167.  This is been high in the past.  She thinks is mostly dietary related.  She is on over-the-counter fish oil which I have advised against due to lack of trial data on this.  We discussed further at length today about Vascepa and how this could be a good addition to her regimen because of the significant  cardiovascular risk reduction that was seen in the reduce it trial.  PMHx:  Past Medical History:  Diagnosis Date  . Allergy    SEASONAL  . ANXIETY 10/28/2007  . Arthritis   . CAD (coronary artery disease), native coronary artery    a. Cath 10/06/2018 - S/p DES to [name]; medical therapy for high grade ostial/pro 2nd digonal   . Cataracts, bilateral    immature  . Chronic back pain   . Coronary artery disease   . DIVERTICULOSIS, COLON 10/28/2007  . Dry skin    red patch on right knee area  . GERD 10/28/2007   takes Prevacid daily  . H/O hiatal hernia   . HLD (hyperlipidemia) 12/12/2017  . Hx of colonic polyps   . HYPERLIPIDEMIA 10/28/2007   unable to take meds d/t allergies  . HYPERTENSION 10/28/2007   takes Diovan daily  . Internal hemorrhoids   . Irritable bowel syndrome (IBS)   . Joint pain   . Joint swelling   . LIVER FUNCTION TESTS, ABNORMAL, HX OF 10/29/2007  . Lumbar disc disease   . Migraine   . Myocardial infarction ([place])   . Obesity   . OSTEOPENIA 10/29/2007  . Osteoporosis   . Presbyacusis 01/04/2010  . Rosacea   . Seasonal allergies    takes Clarinex daily  . Vertigo    hx of  . Vitamin D deficiency    takes Vit D daily    Past Surgical History:  Procedure Laterality Date  . ANTERIOR CERVICAL DECOMP/DISCECTOMY FUSION  08/02/2011   Procedure: ANTERIOR CERVICAL DECOMPRESSION/DISCECTOMY FUSION 3 LEVELS;  Surgeon: Elaina Hoops, Heather Merritt;  Location: [place] NEURO ORS;  Service: Neurosurgery;  Laterality: N/A;  Cervical Three-Four, Cervical Four-Five, Cervical Five-Six  Anterior Cervical Decompression Fusion   . CARDIAC CATHETERIZATION    . COLONOSCOPY  2001/2013  . CORONARY STENT INTERVENTION N/A 10/06/2018   Procedure: CORONARY STENT INTERVENTION;  Surgeon: Lorretta Harp, Heather Merritt;  Location: [place] [facility];  Service: Cardiovascular;  Laterality: N/A;  . ESOPHAGOGASTRODUODENOSCOPY    . LEFT HEART CATH AND CORONARY ANGIOGRAPHY N/A 10/06/2018   Procedure: LEFT HEART CATH AND  CORONARY ANGIOGRAPHY;  Surgeon: Lorretta Harp, Heather Merritt;  Location: [place] CV LAB;  Service: Cardiovascular;  Laterality: N/A;  . POLYPECTOMY    . TONSILLECTOMY    . TONSILLECTOMY  at age 65    FAMHx:  Family History  Problem Relation Age of Onset  . Atrial fibrillation Mother   . Hyperlipidemia Mother   . Stroke Mother   . Heart disease Mother   . Atrial fibrillation Father   . Hypothyroidism Sister   . Hypothyroidism Maternal Grandmother   . Diabetes Maternal Grandmother   . Anesthesia problems Neg Hx   . Hypotension Neg Hx   . Malignant hyperthermia Neg Hx   . Pseudochol deficiency Neg Hx     SOCHx:   reports that she has quit smoking. She has never used smokeless tobacco. She reports current alcohol use. She reports that she does not use drugs.  ALLERGIES:  Allergies  Allergen Reactions  . Penicillins Hives, Itching and Rash    Did it involve swelling of the face/tongue/throat, SOB, or low BP? Unknown Did it involve sudden or severe rash/hives, skin peeling, or any reaction on the inside of your mouth or nose? Unknown Did you need to seek medical attention at a [facility] or doctor's office? Unknown When did it last happen?pt cant recall If all above answers are "NO", may proceed with cephalosporin use.   . Rosuvastatin Other (See Comments)    REACTION: muscle weakness  . Simvastatin Other (See Comments)    REACTION: muscle weakness  . Statins Other (See Comments)    Myalgias and weakness  . Sulfonamide Derivatives Other (See Comments)    Nausea, vomiting, abd pain  . Brilinta [Ticagrelor] Rash    Itchy rash to torso  . Sulfa Antibiotics Other (See Comments)    Nausea, vomiting, abd pain    ROS: Pertinent items noted in HPI and remainder of comprehensive ROS otherwise negative.  HOME MEDS: Current Outpatient Medications on File Prior to Visit  Medication Sig Dispense Refill  . aspirin 81 MG tablet Take 81 mg by mouth daily.      Marland Kitchen atorvastatin  (LIPITOR) 10 MG tablet Take 10 mg by mouth daily.    . Calcium Citrate (CITRACAL PO) Take 2 tablets by mouth daily.     . Cholecalciferol (VITAMIN D) 2000 UNITS CAPS Take 2,000 Units by mouth daily.    . clopidogrel (PLAVIX) 75 MG tablet Take 1 tablet (75 mg total) by mouth daily. 90 tablet 3  . esomeprazole (NEXIUM) 40 MG capsule Take 1 capsule (40 mg total) by mouth daily. 90 capsule 3  . fish oil-omega-3 fatty acids 1000 MG capsule Take 1 g by mouth daily.     . metoprolol tartrate (LOPRESSOR) 25 MG tablet TAKE 0.5 TABLETS (12.5 MG TOTAL) BY MOUTH 2 (TWO) TIMES DAILY. 30 tablet 1  . metroNIDAZOLE (METROGEL) 0.75 % gel Apply 1  application topically at bedtime. 45 g 0  . Multiple Vitamin (MULITIVITAMIN WITH MINERALS) TABS Take 1 tablet by mouth daily.    . nitroGLYCERIN (NITROSTAT) 0.4 MG SL tablet Place 1 tablet (0.4 mg total) under the tongue every 5 (five) minutes x 3 doses as needed for chest pain. 25 tablet 12  . pimecrolimus (ELIDEL) 1 % cream Apply 1 application topically every morning. 30 g 0  . valsartan-hydrochlorothiazide (DIOVAN-HCT) 160-12.5 MG tablet Take 1 tablet by mouth daily. 90 tablet 3  . vitamin E 400 UNIT capsule Take 400 Units by mouth daily.    Marland Kitchen ezetimibe (ZETIA) 10 MG tablet Take 1 tablet (10 mg total) by mouth daily. 90 tablet 3   No current facility-administered medications on file prior to visit.    LABS/IMAGING: No results found for this or any previous visit (from the past 48 hour(s)). No results found.  LIPID PANEL:    Component Value Date/Time   CHOL 158 05/01/2019 1151   TRIG 167 (H) 05/01/2019 1151   HDL 48 05/01/2019 1151   CHOLHDL 3.3 05/01/2019 1151   CHOLHDL 5.7 10/04/2018 0312   VLDL 56 (H) 10/04/2018 0312   LDLCALC 81 05/01/2019 1151   LDLDIRECT 177.0 06/05/2017 1301    WEIGHTS: Wt Readings from Last 3 Encounters:  05/08/19 170 lb 12.8 oz (77.5 kg)  04/17/19 171 lb (77.6 kg)  03/06/19 170 lb 3.1 oz (77.2 kg)    VITALS: BP 136/77    Pulse 73   Ht 5' 3.5" (1.613 m)   Wt 170 lb 12.8 oz (77.5 kg)   SpO2 97%   BMI 29.78 kg/m   EXAM: Psych: Pleasant  EKG: Deferred  ASSESSMENT: 1. Mixed dyslipidemia, goal LDL <70 2. Relative statin intolerance 3. CAD with recent NSTEMI and PCI to the RCA (10/2018)  PLAN: 1.   Heather Merritt is doing well without any significant worsening myalgias after adding ezetimibe.  Unfortunately cannot take any higher doses of her atorvastatin.  Triglycerides have gone up again.  They have been elevated in the past.  She is on over-the-counter fish oil which might increase LDL cholesterol.  I think she is a good candidate for Vascepa.  She meets the criteria for the reduce it trial with elevated triglycerides over 158, LDL at or near goal on max tolerated statin and known coronary artery disease with recent NSTEMI.  Start Vascepa 2 g twice daily.  Repeat lipids in 3 to 4 months and follow-up with me afterwards.  Heather Casino, Heather Merritt, Heather Merritt, Heather Merritt Director of the [facility] Lipid Disorders &  Cardiovascular Risk Reduction Clinic Diplomate of the American Board of Clinical Lipidology Attending Cardiologist  Direct Dial: [phone]  Fax: [phone]  Website:  [email or web address]  Heather Merritt 05/08/2019, 8:14 AM

## 2019-05-11 ENCOUNTER — Telehealth: Payer: Self-pay | Admitting: Internal Medicine

## 2019-05-11 NOTE — Telephone Encounter (Signed)
LMTCB

## 2019-05-11 NOTE — Telephone Encounter (Signed)
Patient states that she is requesting to have the non generic brand of icosapent Ethyl (VASCEPA) medication filled in order to avoid paying the full price. Patient states that [facility] does not cover the generic brand of the medication. Please call to confirm.

## 2019-05-12 NOTE — Telephone Encounter (Signed)
LM that MyChart message will be sent Need prescription drug coverage info in order to process prior auth

## 2019-05-12 NOTE — Telephone Encounter (Signed)
PA submitted via CMM Information regarding your request Available without authorization

## 2019-05-18 ENCOUNTER — Ambulatory Visit: Payer: Medicare Other | Attending: Internal Medicine

## 2019-05-18 DIAGNOSIS — Z23 Encounter for immunization: Secondary | ICD-10-CM | POA: Insufficient documentation

## 2019-05-18 NOTE — Progress Notes (Signed)
   Covid-19 Vaccination Clinic  Name:  Heather Merritt    MRN: [id number] DOB: Aug 18, 1955  05/18/2019  Ms. Roback was observed post Covid-19 immunization for 15 minutes without incidence. She was provided with Vaccine Information Sheet and instruction to access the V-Safe system.   Ms. Mehlhaff was instructed to call 911 with any severe reactions post vaccine: Marland Kitchen Difficulty breathing  . Swelling of your face and throat  . A fast heartbeat  . A bad rash all over your body  . Dizziness and weakness    Immunizations Administered    Name Date Dose VIS Date Route   Pfizer COVID-19 Vaccine 05/18/2019  9:46 AM 0.3 mL 03/13/2019 Intramuscular   Manufacturer: [place]   Lot: [id number]   [place]: [id number]

## 2019-06-17 ENCOUNTER — Other Ambulatory Visit: Payer: Self-pay | Admitting: Obstetrics

## 2019-06-17 DIAGNOSIS — M8580 Other specified disorders of bone density and structure, unspecified site: Secondary | ICD-10-CM

## 2019-08-11 ENCOUNTER — Other Ambulatory Visit: Payer: Medicare Other

## 2019-08-16 ENCOUNTER — Other Ambulatory Visit: Payer: Self-pay

## 2019-08-16 ENCOUNTER — Emergency Department (HOSPITAL_COMMUNITY)
Admission: EM | Admit: 2019-08-16 | Discharge: 2019-08-17 | Disposition: A | Discharge status: Home / Self Care | Payer: Medicare Other | Attending: Emergency Medicine | Admitting: Emergency Medicine

## 2019-08-16 ENCOUNTER — Encounter (HOSPITAL_COMMUNITY): Payer: Self-pay | Admitting: Emergency Medicine

## 2019-08-16 ENCOUNTER — Emergency Department (HOSPITAL_COMMUNITY): Payer: Medicare Other

## 2019-08-16 DIAGNOSIS — Z79899 Other long term (current) drug therapy: Secondary | ICD-10-CM | POA: Diagnosis not present

## 2019-08-16 DIAGNOSIS — I2510 Atherosclerotic heart disease of native coronary artery without angina pectoris: Secondary | ICD-10-CM | POA: Diagnosis not present

## 2019-08-16 DIAGNOSIS — Z7982 Long term (current) use of aspirin: Secondary | ICD-10-CM | POA: Diagnosis not present

## 2019-08-16 DIAGNOSIS — Z87891 Personal history of nicotine dependence: Secondary | ICD-10-CM | POA: Insufficient documentation

## 2019-08-16 DIAGNOSIS — R12 Heartburn: Secondary | ICD-10-CM | POA: Diagnosis not present

## 2019-08-16 DIAGNOSIS — I252 Old myocardial infarction: Secondary | ICD-10-CM | POA: Diagnosis not present

## 2019-08-16 DIAGNOSIS — I10 Essential (primary) hypertension: Secondary | ICD-10-CM | POA: Insufficient documentation

## 2019-08-16 DIAGNOSIS — R0789 Other chest pain: Secondary | ICD-10-CM | POA: Diagnosis not present

## 2019-08-16 LAB — CBC
HCT: 42.2 % (ref 36.0–46.0)
Hemoglobin: 14.1 g/dL (ref 12.0–15.0)
MCH: 30.6 pg (ref 26.0–34.0)
MCHC: 33.4 g/dL (ref 30.0–36.0)
MCV: 91.5 fL (ref 80.0–100.0)
Platelets: 245 10*3/uL (ref 150–400)
RBC: 4.61 MIL/uL (ref 3.87–5.11)
RDW: 12.1 % (ref 11.5–15.5)
WBC: 3.9 10*3/uL — ABNORMAL LOW (ref 4.0–10.5)
nRBC: 0 % (ref 0.0–0.2)

## 2019-08-16 LAB — BASIC METABOLIC PANEL
Anion gap: 9 (ref 5–15)
BUN: 15 mg/dL (ref 8–23)
CO2: 30 mmol/L (ref 22–32)
Calcium: 10 mg/dL (ref 8.9–10.3)
Chloride: 100 mmol/L (ref 98–111)
Creatinine, Ser: 0.66 mg/dL (ref 0.44–1.00)
GFR calc Af Amer: >60 mL/min (ref >60)
GFR calc non Af Amer: >60 mL/min (ref >60)
Glucose, Bld: 93 mg/dL (ref 70–99)
Potassium: 4.2 mmol/L (ref 3.5–5.1)
Sodium: 139 mmol/L (ref 135–145)

## 2019-08-16 LAB — TROPONIN I (HIGH SENSITIVITY)
Troponin I (High Sensitivity): 3 ng/L (ref <18)
Troponin I (High Sensitivity): 2 ng/L (ref <18)

## 2019-08-16 MED ORDER — SODIUM CHLORIDE 0.9% FLUSH
3.0000 mL | Freq: Once | INTRAVENOUS | Status: AC
  Administered 2019-08-16: 3 mL via INTRAVENOUS

## 2019-08-16 MED ORDER — ALUM & MAG HYDROXIDE-SIMETH 200-200-20 MG/5ML PO SUSP
30.0000 mL | Freq: Once | ORAL | Status: AC
  Administered 2019-08-16: 30 mL via ORAL
  Filled 2019-08-16: qty 30

## 2019-08-16 NOTE — ED Provider Notes (Signed)
[place] EMERGENCY DEPARTMENT Provider Note   CSN: [id number] Arrival date & time: 08/16/19  1936     History Chief Complaint  Patient presents with  . Heartburn  . Hypertension    Heather Merritt is a 64 y.o. female.  64 year old female with prior medical history detailed below presents for evaluation of burning chest discomfort.  Patient reports that she had an episode of burning chest discomfort that started around noon today.  Symptoms described are consistent with her prior episodes of GERD.  She did not take anything specifically for her pain.  The pain persisted over the course of the afternoon and she decided to be evaluated for possibility of a heart problem.  She reports prior history of cardiac stent.    The history is provided by the patient and medical records.  Heartburn This is a new problem. The current episode started 1 to 2 hours ago. The problem occurs constantly. The problem has not changed since onset.Pertinent negatives include no chest pain and no abdominal pain. Nothing aggravates the symptoms. Nothing relieves the symptoms.  Hypertension Pertinent negatives include no chest pain and no abdominal pain.       Past Medical History:  Diagnosis Date  . Allergy    SEASONAL  . ANXIETY 10/28/2007  . Arthritis   . CAD (coronary artery disease), native coronary artery    a. Cath 10/06/2018 - S/p DES to [facility]; medical therapy for high grade ostial/pro 2nd digonal   . Cataracts, bilateral    immature  . Chronic back pain   . Coronary artery disease   . DIVERTICULOSIS, COLON 10/28/2007  . Dry skin    red patch on right knee area  . GERD 10/28/2007   takes Prevacid daily  . H/O hiatal hernia   . HLD (hyperlipidemia) 12/12/2017  . Hx of colonic polyps   . HYPERLIPIDEMIA 10/28/2007   unable to take meds d/t allergies  . HYPERTENSION 10/28/2007   takes Diovan daily  . Internal hemorrhoids   . Irritable bowel syndrome (IBS)   . Joint pain     . Joint swelling   . LIVER FUNCTION TESTS, ABNORMAL, HX OF 10/29/2007  . Lumbar disc disease   . Migraine   . Myocardial infarction ([place])   . Obesity   . OSTEOPENIA 10/29/2007  . Osteoporosis   . Presbyacusis 01/04/2010  . Rosacea   . Seasonal allergies    takes Clarinex daily  . Vertigo    hx of  . Vitamin D deficiency    takes Vit D daily    Patient Active Problem List   Diagnosis Date Noted  . Hyperglycemia 10/15/2018  . NSTEMI (non-ST elevated myocardial infarction) ([place]) 10/03/2018  . HLD (hyperlipidemia) 12/12/2017  . Microhematuria 06/05/2016  . Anterior neck pain 03/09/2016  . Vertigo 03/09/2016  . PAC (premature atrial contraction) 04/29/2015  . History of colonic polyps 04/29/2015  . Cervical radiculitis 12/21/2014  . Pain in the chest 04/15/2014  . Osteoporosis 02/27/2011  . Lumbar disc disease 02/27/2011  . Back pain 02/27/2011  . Encounter for well adult exam with abnormal findings 02/24/2011  . Presbyacusis 01/04/2010  . LIVER FUNCTION TESTS, ABNORMAL, HX OF 10/29/2007  . Anxiety state 10/28/2007  . Essential hypertension 10/28/2007  . GERD 10/28/2007  . DIVERTICULOSIS, COLON 10/28/2007    Past Surgical History:  Procedure Laterality Date  . ANTERIOR CERVICAL DECOMP/DISCECTOMY FUSION  08/02/2011   Procedure: ANTERIOR CERVICAL DECOMPRESSION/DISCECTOMY FUSION 3 LEVELS;  Surgeon: Julien Girt  Saintclair Halsted, MD;  Location: [facility];  Service: Neurosurgery;  Laterality: N/A;  Cervical Three-Four, Cervical Four-Five, Cervical Five-Six  Anterior Cervical Decompression Fusion   . CARDIAC CATHETERIZATION    . COLONOSCOPY  2001/2013  . CORONARY STENT INTERVENTION N/A 10/06/2018   Procedure: CORONARY STENT INTERVENTION;  Surgeon: Lorretta Harp, MD;  Location: [place] CV LAB;  Service: Cardiovascular;  Laterality: N/A;  . ESOPHAGOGASTRODUODENOSCOPY    . LEFT HEART CATH AND CORONARY ANGIOGRAPHY N/A 10/06/2018   Procedure: LEFT HEART CATH AND CORONARY ANGIOGRAPHY;  Surgeon:  Lorretta Harp, MD;  Location: [place] CV [facility];  Service: Cardiovascular;  Laterality: N/A;  . POLYPECTOMY    . TONSILLECTOMY    . TONSILLECTOMY  at age 6     OB History   No obstetric history on file.     Family History  Problem Relation Age of Onset  . Atrial fibrillation Mother   . Hyperlipidemia Mother   . Stroke Mother   . Heart disease Mother   . Atrial fibrillation Father   . Hypothyroidism Sister   . Hypothyroidism Maternal Grandmother   . Diabetes Maternal Grandmother   . Anesthesia problems Neg Hx   . Hypotension Neg Hx   . Malignant hyperthermia Neg Hx   . Pseudochol deficiency Neg Hx     Social History   Tobacco Use  . Smoking status: Former Research scientist (life sciences)  . Smokeless tobacco: Never Used  . Tobacco comment: quit in 45yrs ago  Substance Use Topics  . Alcohol use: Yes    Alcohol/week: 0.0 standard drinks    Comment: occ beer/drink  . Drug use: No    Home Medications Prior to Admission medications   Medication Sig Start Date End Date Taking? Authorizing Provider  aspirin 81 MG tablet Take 81 mg by mouth daily.      [provider]  atorvastatin (LIPITOR) 10 MG tablet Take 1 tablet (10 mg total) by mouth daily. 05/08/19   Hilty, Nadean Corwin, MD  Calcium Citrate (CITRACAL PO) Take 2 tablets by mouth daily.     [provider]  Cholecalciferol (VITAMIN D) 2000 UNITS CAPS Take 2,000 Units by mouth daily.    [provider]  clopidogrel (PLAVIX) 75 MG tablet Take 1 tablet (75 mg total) by mouth daily. 10/24/18 10/24/19  Lorretta Harp, MD  esomeprazole (NEXIUM) 40 MG capsule Take 1 capsule (40 mg total) by mouth daily. 10/15/18   Biagio Borg, MD  ezetimibe (ZETIA) 10 MG tablet Take 1 tablet (10 mg total) by mouth daily. 01/22/19 04/22/19  Hilty, Nadean Corwin, MD  icosapent Ethyl (VASCEPA) 1 g capsule Take 2 capsules (2 g total) by mouth 2 (two) times daily. 05/08/19   Hilty, Nadean Corwin, MD  metoprolol tartrate (LOPRESSOR) 25 MG tablet TAKE 0.5  TABLETS (12.5 MG TOTAL) BY MOUTH 2 (TWO) TIMES DAILY. 11/10/18 11/05/19  Almyra Deforest, PA  metroNIDAZOLE (METROGEL) 0.75 % gel Apply 1 application topically at bedtime. 06/11/17   Biagio Borg, MD  Multiple Vitamin (MULITIVITAMIN WITH MINERALS) TABS Take 1 tablet by mouth daily.    [provider]  nitroGLYCERIN (NITROSTAT) 0.4 MG SL tablet Place 1 tablet (0.4 mg total) under the tongue every 5 (five) minutes x 3 doses as needed for chest pain. 10/07/18   Bhagat, Crista Luria, PA  pimecrolimus (ELIDEL) 1 % cream Apply 1 application topically every morning. 06/11/17   Biagio Borg, MD  valsartan-hydrochlorothiazide (DIOVAN-HCT) 160-12.5 MG tablet Take 1 tablet by mouth daily.  10/15/18   Biagio Borg, MD  vitamin E 400 UNIT capsule Take 400 Units by mouth daily.    [provider]    Allergies    Penicillins, Rosuvastatin, Simvastatin, Statins, Sulfonamide derivatives, Brilinta [ticagrelor], and Sulfa antibiotics  Review of Systems   Review of Systems  Cardiovascular: Negative for chest pain.  Gastrointestinal: Positive for heartburn. Negative for abdominal pain.  All other systems reviewed and are negative.   Physical Exam Updated Vital Signs BP 118/65   Pulse 65   Temp 98.1 F (36.7 C) (Oral)   Resp 17   Ht 5' 3.5" (1.613 m)   Wt 77.1 kg   SpO2 94%   BMI 29.64 kg/m   Physical Exam Vitals and nursing note reviewed.  Constitutional:      General: She is not in acute distress.    Appearance: Normal appearance. She is well-developed.  HENT:     Head: Normocephalic and atraumatic.  Eyes:     Conjunctiva/sclera: Conjunctivae normal.     Pupils: Pupils are equal, round, and reactive to light.  Cardiovascular:     Rate and Rhythm: Normal rate and regular rhythm.     Heart sounds: Normal heart sounds.  Pulmonary:     Effort: Pulmonary effort is normal. No respiratory distress.     Breath sounds: Normal breath sounds.  Abdominal:     General: There is no distension.      Palpations: Abdomen is soft.     Tenderness: There is no abdominal tenderness.  Musculoskeletal:        General: No deformity. Normal range of motion.     Cervical back: Normal range of motion and neck supple.  Skin:    General: Skin is warm and dry.  Neurological:     Mental Status: She is alert and oriented to person, place, and time. Mental status is at baseline.     ED Results / Procedures / Treatments   Labs (all labs ordered are listed, but only abnormal results are displayed) Labs Reviewed  CBC - Abnormal; Notable for the following components:      Result Value   WBC 3.9 (*)    All other components within normal limits  BASIC METABOLIC PANEL  TROPONIN I (HIGH SENSITIVITY)  TROPONIN I (HIGH SENSITIVITY)    EKG EKG Interpretation  Date/Time:  Sunday Aug 16 2019 19:45:37 EDT Ventricular Rate:  67 PR Interval:    QRS Duration: 107 QT Interval:  406 QTC Calculation: 429 R Axis:   65 Text Interpretation: Sinus rhythm Anterior infarct, old Confirmed by Dene Gentry [phone]) on 08/16/2019 7:55:17 PM   Radiology DG Chest 2 View  Result Date: 08/16/2019 CLINICAL DATA:  Chest pain and heartburn EXAM: CHEST - 2 VIEW COMPARISON:  October 03, 2018 FINDINGS: The heart size and mediastinal contours are within normal limits. Both lungs are clear. The visualized skeletal structures are unremarkable. Cervical fixation hardware is seen. IMPRESSION: No active cardiopulmonary disease. Electronically Signed   By: Prudencio Pair M.D.   On: 08/16/2019 20:23    Procedures Procedures (including critical care time)  Medications Ordered in ED Medications  sodium chloride flush (NS) 0.9 % injection 3 mL (3 mLs Intravenous Given 08/16/19 2030)  alum & mag hydroxide-simeth (MAALOX/MYLANTA) 200-200-20 MG/5ML suspension 30 mL (30 mLs Oral Given 08/16/19 2033)    ED Course  I have reviewed the triage vital signs and the nursing notes.  Pertinent labs & imaging results that were available during  my care  of the patient were reviewed by me and considered in my medical decision making (see chart for details).    MDM Rules/Calculators/A&P                      MDM   Screen complete  Heather Merritt was evaluated in Emergency Department on 08/16/2019 for the symptoms described in the history of present illness. She was evaluated in the context of the global COVID-19 pandemic, which necessitated consideration that the patient might be at risk for infection with the SARS-CoV-2 virus that causes COVID-19. Institutional protocols and algorithms that pertain to the evaluation of patients at risk for COVID-19 are in a state of rapid change based on information released by regulatory bodies including the CDC and federal and state organizations. These policies and algorithms were followed during the patient's care in the ED.  Patient is presenting for evaluation of atypical chest discomfort.  Her describe symptoms are not consistent with likely ACS.  EKG is without evidence of acute ischemia.  Troponin x 2 is without significant abnormality.  She does feel improved following her ED evaluation.  She does understand the need for close follow-up.  Strict return precautions given understood.   Final Clinical Impression(s) / ED Diagnoses Final diagnoses:  Atypical chest pain    Rx / DC Orders ED Discharge Orders    None       Valarie Merino, MD 08/16/19 2336

## 2019-08-16 NOTE — ED Notes (Signed)
Pt transported to Xray. 

## 2019-08-16 NOTE — ED Triage Notes (Signed)
Per ems, pt from home reporting central chest burning that started this morning. Pt reports her acid reflux feels like it has been getting worse. Pt reports hx of GERD, has had a recent medication change. Hx of MI with stent placement last year. EKG unremarkable by ems. Pt also reports blood pressure has been a little more elevated than regular, pt compliant with medications. Pt took 324 of asa before ems arrival.

## 2019-08-16 NOTE — Discharge Instructions (Addendum)
Return for any problem.  Follow-up with your regular care provider as instructed.  Use Maalox as instructed for treatment of your symptoms if required.

## 2019-08-17 NOTE — ED Notes (Signed)
Patient verbalizes understanding of discharge instructions. Opportunity for questioning and answers were provided. Armband removed by staff, pt discharged from ED ambulatory.   

## 2019-08-18 ENCOUNTER — Encounter: Payer: Self-pay | Admitting: Internal Medicine

## 2019-08-18 ENCOUNTER — Ambulatory Visit (INDEPENDENT_AMBULATORY_CARE_PROVIDER_SITE_OTHER): Payer: Medicare Other | Admitting: Internal Medicine

## 2019-08-18 ENCOUNTER — Other Ambulatory Visit: Payer: Self-pay

## 2019-08-18 VITALS — BP 120/82 | HR 65 | Temp 98.1°F | Ht 63.5 in | Wt 170.0 lb

## 2019-08-18 DIAGNOSIS — I10 Essential (primary) hypertension: Secondary | ICD-10-CM

## 2019-08-18 DIAGNOSIS — K219 Gastro-esophageal reflux disease without esophagitis: Secondary | ICD-10-CM

## 2019-08-18 DIAGNOSIS — R739 Hyperglycemia, unspecified: Secondary | ICD-10-CM

## 2019-08-18 DIAGNOSIS — J309 Allergic rhinitis, unspecified: Secondary | ICD-10-CM

## 2019-08-18 MED ORDER — METOPROLOL TARTRATE 25 MG PO TABS: 12.5000 mg | ORAL_TABLET | Freq: Two times a day (BID) | ORAL | 3 refills | Status: DC

## 2019-08-18 MED ORDER — VALSARTAN-HYDROCHLOROTHIAZIDE 160-12.5 MG PO TABS: 1.0000 | ORAL_TABLET | Freq: Every day | ORAL | 3 refills | Status: DC

## 2019-08-18 MED ORDER — CLOPIDOGREL BISULFATE 75 MG PO TABS: 75.0000 mg | ORAL_TABLET | Freq: Every day | ORAL | 3 refills | Status: DC

## 2019-08-18 MED ORDER — ESOMEPRAZOLE MAGNESIUM 40 MG PO CPDR: 40.0000 mg | DELAYED_RELEASE_CAPSULE | Freq: Every day | ORAL | 3 refills | Status: DC

## 2019-08-18 NOTE — Progress Notes (Signed)
Subjective:    Patient ID: Heather Merritt, female    DOB: [birth year], 64 y.o.   MRN: [id number]  HPI  Here to f/u; overall doing ok,  Pt denies chest pain, increasing sob or doe, wheezing, orthopnea, PND, increased LE swelling, palpitations, dizziness or syncope.  Pt denies new neurological symptoms such as new headache, or facial or extremity weakness or numbness.  Pt denies polydipsia, polyuria, or low sugar episode.  Pt states overall good compliance with meds, mostly trying to follow appropriate diet, with wt overall stable, Has been having worsening reflux symptoms and was seen in ED recent with neg CP evaluation.  Does have several wks ongoing nasal allergy symptoms with clearish congestion, itch and sneezing, without fever, pain, ST, cough, swelling or wheezing. Wt Readings from Last 3 Encounters:  08/18/19 170 lb (77.1 kg)  08/16/19 170 lb (77.1 kg)  05/08/19 170 lb 12.8 oz (77.5 kg)   Past Medical History:  Diagnosis Date  . Allergy    SEASONAL  . ANXIETY 10/28/2007  . Arthritis   . CAD (coronary artery disease), native coronary artery    a. Cath 10/06/2018 - S/p DES to [facility]; medical therapy for high grade ostial/pro 2nd digonal   . Cataracts, bilateral    immature  . Chronic back pain   . Coronary artery disease   . DIVERTICULOSIS, COLON 10/28/2007  . Dry skin    red patch on right knee area  . GERD 10/28/2007   takes Prevacid daily  . H/O hiatal hernia   . HLD (hyperlipidemia) 12/12/2017  . Hx of colonic polyps   . HYPERLIPIDEMIA 10/28/2007   unable to take meds d/t allergies  . HYPERTENSION 10/28/2007   takes Diovan daily  . Internal hemorrhoids   . Irritable bowel syndrome (IBS)   . Joint pain   . Joint swelling   . LIVER FUNCTION TESTS, ABNORMAL, HX OF 10/29/2007  . Lumbar disc disease   . Migraine   . Myocardial infarction ([place])   . Obesity   . OSTEOPENIA 10/29/2007  . Osteoporosis   . Presbyacusis 01/04/2010  . Rosacea   . Seasonal allergies    takes Clarinex  daily  . Vertigo    hx of  . Vitamin D deficiency    takes Vit D daily   Past Surgical History:  Procedure Laterality Date  . ANTERIOR CERVICAL DECOMP/DISCECTOMY FUSION  08/02/2011   Procedure: ANTERIOR CERVICAL DECOMPRESSION/DISCECTOMY FUSION 3 LEVELS;  Surgeon: Elaina Hoops, MD;  Location: [place] [facility];  Service: Neurosurgery;  Laterality: N/A;  Cervical Three-Four, Cervical Four-Five, Cervical Five-Six  Anterior Cervical Decompression Fusion   . CARDIAC CATHETERIZATION    . COLONOSCOPY  2001/2013  . CORONARY STENT INTERVENTION N/A 10/06/2018   Procedure: CORONARY STENT INTERVENTION;  Surgeon: Lorretta Harp, MD;  Location: [place] CV LAB;  Service: Cardiovascular;  Laterality: N/A;  . ESOPHAGOGASTRODUODENOSCOPY    . LEFT HEART CATH AND CORONARY ANGIOGRAPHY N/A 10/06/2018   Procedure: LEFT HEART CATH AND CORONARY ANGIOGRAPHY;  Surgeon: Lorretta Harp, MD;  Location: [place] CV LAB;  Service: Cardiovascular;  Laterality: N/A;  . POLYPECTOMY    . TONSILLECTOMY    . TONSILLECTOMY  at age 39    reports that she has quit smoking. She has never used smokeless tobacco. She reports current alcohol use. She reports that she does not use drugs. family history includes Atrial fibrillation in her father and mother; Diabetes in her maternal grandmother; Heart disease in her mother;  Hyperlipidemia in her mother; Hypothyroidism in her maternal grandmother and sister; Stroke in her mother. Allergies  Allergen Reactions  . Penicillins Hives, Itching and Rash    Did it involve swelling of the face/tongue/throat, SOB, or low BP? Unknown Did it involve sudden or severe rash/hives, skin peeling, or any reaction on the inside of your mouth or nose? Unknown Did you need to seek medical attention at a hospital or doctor's office? Unknown When did it last happen?pt cant recall If all above answers are "NO", may proceed with cephalosporin use.   . Rosuvastatin Other (See Comments)    REACTION:  muscle weakness  . Simvastatin Other (See Comments)    REACTION: muscle weakness  . Statins Other (See Comments)    Myalgias and weakness  . Sulfonamide Derivatives Other (See Comments)    Nausea, vomiting, abd pain  . Brilinta [Ticagrelor] Rash    Itchy rash to torso  . Sulfa Antibiotics Other (See Comments)    Nausea, vomiting, abd pain   Current Outpatient Medications on File Prior to Visit  Medication Sig Dispense Refill  . aspirin 81 MG tablet Take 81 mg by mouth daily.      Marland Kitchen atorvastatin (LIPITOR) 10 MG tablet Take 1 tablet (10 mg total) by mouth daily. 90 tablet 3  . Calcium Citrate (CITRACAL PO) Take 2 tablets by mouth daily.     . Cholecalciferol (VITAMIN D) 2000 UNITS CAPS Take 2,000 Units by mouth daily.    Marland Kitchen icosapent Ethyl (VASCEPA) 1 g capsule Take 2 capsules (2 g total) by mouth 2 (two) times daily. 360 capsule 3  . metroNIDAZOLE (METROGEL) 0.75 % gel Apply 1 application topically at bedtime. 45 g 0  . Multiple Vitamin (MULITIVITAMIN WITH MINERALS) TABS Take 1 tablet by mouth daily.    . nitroGLYCERIN (NITROSTAT) 0.4 MG SL tablet Place 1 tablet (0.4 mg total) under the tongue every 5 (five) minutes x 3 doses as needed for chest pain. 25 tablet 12  . pimecrolimus (ELIDEL) 1 % cream Apply 1 application topically every morning. 30 g 0  . ezetimibe (ZETIA) 10 MG tablet Take 1 tablet (10 mg total) by mouth daily. 90 tablet 3   No current facility-administered medications on file prior to visit.   Review of Systems All otherwise neg per pt     Objective:   Physical Exam BP 120/82 (BP Location: Left Arm, Patient Position: Sitting, Cuff Size: Large)   Pulse 65   Temp 98.1 F (36.7 C) (Oral)   Ht 5' 3.5" (1.613 m)   Wt 170 lb (77.1 kg)   SpO2 98%   BMI 29.64 kg/m  VS noted,  Constitutional: Pt appears in NAD HENT: Head: NCAT.  Right Ear: External ear normal.  Left Ear: External ear normal.  Eyes: . Pupils are equal, round, and reactive to light. Conjunctivae and  EOM are normal Nose: without d/c or deformity Neck: Neck supple. Gross normal ROM Cardiovascular: Normal rate and regular rhythm.   Pulmonary/Chest: Effort normal and breath sounds without rales or wheezing.  Abd:  Soft, NT, ND, + BS, no organomegaly Neurological: Pt is alert. At baseline orientation, motor grossly intact Skin: Skin is warm. No rashes, other new lesions, no LE edema Psychiatric: Pt behavior is normal without agitation  All otherwise neg per pt  Lab Results  Component Value Date   WBC 3.9 (L) 08/16/2019   HGB 14.1 08/16/2019   HCT 42.2 08/16/2019   PLT 245 08/16/2019   GLUCOSE 93 08/16/2019  CHOL 158 05/01/2019   TRIG 167 (H) 05/01/2019   HDL 48 05/01/2019   LDLDIRECT 177.0 06/05/2017   LDLCALC 81 05/01/2019   ALT 34 (H) 05/01/2019   AST 24 05/01/2019   NA 139 08/16/2019   K 4.2 08/16/2019   CL 100 08/16/2019   CREATININE 0.66 08/16/2019   BUN 15 08/16/2019   CO2 30 08/16/2019   TSH 1.86 06/05/2017   INR 0.9 10/03/2018   HGBA1C 5.2 10/15/2018       Assessment & Plan:

## 2019-08-18 NOTE — Assessment & Plan Note (Addendum)
D/w pt potential change to protonix but declines in the end, ok for pepcid 10 qhs and f/u GI   I spent 31 minutes in preparing to see the patient by review of recent labs, imaging and procedures, obtaining and reviewing separately obtained history, communicating with the patient and family or caregiver, ordering medications, tests or procedures, and documenting clinical information in the EHR including the differential Dx, treatment, and any further evaluation and other management of gerd, allergies, hyperglycemia, htn

## 2019-08-18 NOTE — Patient Instructions (Addendum)
Your A1c was OK today  Please take all new medication as discussed - the OTC Allegra, Nasacort, Mucinex, and Pepcid 20 mg at bedtime  Please continue all other medications as before, and refills have been done if requested.  Please have the pharmacy call with any other refills you may need.  Please continue your efforts at being more active, low cholesterol diet, and weight control.  You are otherwise up to date with prevention measures today.  Please keep your appointments with your specialists as you may have planned - Dr Henrene Pastor for reflux, and Dr Debara Pickett  Please make an Appointment to return in 6 months, or sooner if needed

## 2019-08-18 NOTE — Assessment & Plan Note (Signed)
stable overall by history and exam, recent data reviewed with pt, and pt to continue medical treatment as before,  to f/u any worsening symptoms or concerns  

## 2019-08-18 NOTE — Assessment & Plan Note (Signed)
Mild to mod, for allegra, mucinex, nasacort asd,  to f/u any worsening symptoms or concerns

## 2019-09-04 LAB — LIPID PANEL
Chol/HDL Ratio: 3.8 ratio (ref 0.0–4.4)
Cholesterol, Total: 171 mg/dL (ref 100–199)
HDL: 45 mg/dL (ref >39)
LDL Chol Calc (NIH): 99 mg/dL (ref 0–99)
Triglycerides: 154 mg/dL — ABNORMAL HIGH (ref 0–149)
VLDL Cholesterol Cal: 27 mg/dL (ref 5–40)

## 2019-09-09 ENCOUNTER — Ambulatory Visit: Payer: Medicare Other | Admitting: Internal Medicine

## 2019-09-10 ENCOUNTER — Other Ambulatory Visit: Payer: Self-pay

## 2019-09-10 ENCOUNTER — Encounter: Payer: Self-pay | Admitting: Internal Medicine

## 2019-09-10 ENCOUNTER — Ambulatory Visit (INDEPENDENT_AMBULATORY_CARE_PROVIDER_SITE_OTHER): Payer: Medicare Other | Admitting: Internal Medicine

## 2019-09-10 VITALS — BP 124/72 | HR 71 | Ht 63.5 in | Wt 176.0 lb

## 2019-09-10 DIAGNOSIS — E785 Hyperlipidemia, unspecified: Secondary | ICD-10-CM | POA: Diagnosis not present

## 2019-09-10 DIAGNOSIS — I214 Non-ST elevation (NSTEMI) myocardial infarction: Secondary | ICD-10-CM | POA: Diagnosis not present

## 2019-09-10 DIAGNOSIS — I2510 Atherosclerotic heart disease of native coronary artery without angina pectoris: Secondary | ICD-10-CM | POA: Diagnosis not present

## 2019-09-10 DIAGNOSIS — M791 Myalgia, unspecified site: Secondary | ICD-10-CM

## 2019-09-10 DIAGNOSIS — T466X5A Adverse effect of antihyperlipidemic and antiarteriosclerotic drugs, initial encounter: Secondary | ICD-10-CM

## 2019-09-10 MED ORDER — ATORVASTATIN CALCIUM 20 MG PO TABS: 20.0000 mg | ORAL_TABLET | Freq: Every day | ORAL | 3 refills | Status: DC

## 2019-09-10 NOTE — Patient Instructions (Signed)
Medication Instructions:  INCREASE atorvastatin to 20mg  daily  *If you need a refill on your cardiac medications before your next appointment, please call your pharmacy*   Lab Work: FASTING lab work in 3 months to check cholesterol   If you have labs (blood work) drawn today and your tests are completely normal, you will receive your results only by: Marland Kitchen MyChart Message (if you have MyChart) OR . A paper copy in the mail If you have any lab test that is abnormal or we need to change your treatment, we will call you to review the results.   Testing/Procedures: NONE   Follow-Up: At [facility], you and your health needs are our priority.  As part of our continuing mission to provide you with exceptional heart care, we have created designated Provider Care Teams.  These Care Teams include your primary Cardiologist (physician) and Advanced Practice Providers (APPs -  Physician Assistants and Nurse Practitioners) who all work together to provide you with the care you need, when you need it.  We recommend signing up for the patient portal called "MyChart".  Sign up information is provided on this After Visit Summary.  MyChart is used to connect with patients for Virtual Visits (Telemedicine).  Patients are able to view lab/test results, encounter notes, upcoming appointments, etc.  Non-urgent messages can be sent to your provider as well.   To learn more about what you can do with MyChart, go to [email or web address].    Your next appointment:   6 month(s) - lipid clinic  The format for your next appointment:   Either In Person or Virtual  Provider:   K. Mali Hilty, MD   Other Instructions

## 2019-09-10 NOTE — Progress Notes (Signed)
LIPID CLINIC CONSULT NOTE  Chief Complaint:  Follow-up dyslipidemia  Primary Care Physician: Biagio Borg, MD  Primary Cardiologist:  Quay Burow, MD  HPI:  Heather Merritt is a 64 y.o. female who is being seen today for the evaluation of dyslipidemia at the request of Biagio Borg, MD. This is a 64 year old female patient of Dr. Alvester Chou who was referred by Almyra Deforest, PA-C, for management of dyslipidemia.  Unfortunately she recently had chest pain was found to have a non-STEMI.  Cath in July 2020 demonstrated some coronary disease specifically in the right coronary artery requiring a drug-eluting stent placement.  Since then she has done very well without recurrent chest pain.  She started cardiac rehabilitation.  She does have a longstanding history of dyslipidemia including family history of coronary disease.  She had previously been on a number of different statin medications including atorvastatin, rosuvastatin, simvastatin, pravastatin and others with significant myalgias, particularly at the higher doses.  She had been taking atorvastatin 10 mg every other day prior to this event and was convinced to take it on a daily basis afterwards.  So far that seems to be reasonably well tolerated with some mild myalgias.  She has not had reassessment of her cholesterol since July however her total cholesterol the time was 204, triglycerides 280, HDL 36 and LDL of 112.  05/08/2019  [name] returns today for follow-up.  Overall she is doing fairly well.  Her cholesterol has improved on the Zetia.  LDL is down to 81 from 113.  Total cholesterol is 158 with triglycerides increased to 167.  This is been high in the past.  She thinks is mostly dietary related.  She is on over-the-counter fish oil which I have advised against due to lack of trial data on this.  We discussed further at length today about Vascepa and how this could be a good addition to her regimen because of the significant  cardiovascular risk reduction that was seen in the reduce it trial.  09/10/2019  [name] is seen today in follow-up.  Unfortunately she could not afford Vascepa but did make some dietary changes.  She started taking over-the-counter fish oil.  She noted that her LDL cholesterol is actually gone now up and her triglycerides though have come down.  Most recent lipid showed total cholesterol 171, triglycerides 154 and LDL of 99.  I advised that there was no data for cardiovascular risk reduction with over-the-counter fish oils and I would recommend not taking it.  Since she cannot afford the Vascepa, we will have to focus on better LDL control.  She is only on low-dose atorvastatin due to side effects.  PMHx:  Past Medical History:  Diagnosis Date  . Allergy    SEASONAL  . ANXIETY 10/28/2007  . Arthritis   . CAD (coronary artery disease), native coronary artery    a. Cath 10/06/2018 - S/p DES to [facility]; medical therapy for high grade ostial/pro 2nd digonal   . Cataracts, bilateral    immature  . Chronic back pain   . Coronary artery disease   . DIVERTICULOSIS, COLON 10/28/2007  . Dry skin    red patch on right knee area  . GERD 10/28/2007   takes Prevacid daily  . H/O hiatal hernia   . HLD (hyperlipidemia) 12/12/2017  . Hx of colonic polyps   . HYPERLIPIDEMIA 10/28/2007   unable to take meds d/t allergies  . HYPERTENSION 10/28/2007   takes Diovan daily  .  Internal hemorrhoids   . Irritable bowel syndrome (IBS)   . Joint pain   . Joint swelling   . LIVER FUNCTION TESTS, ABNORMAL, HX OF 10/29/2007  . Lumbar disc disease   . Migraine   . Myocardial infarction ([place])   . Obesity   . OSTEOPENIA 10/29/2007  . Osteoporosis   . Presbyacusis 01/04/2010  . Rosacea   . Seasonal allergies    takes Clarinex daily  . Vertigo    hx of  . Vitamin D deficiency    takes Vit D daily    Past Surgical History:  Procedure Laterality Date  . ANTERIOR CERVICAL DECOMP/DISCECTOMY FUSION  08/02/2011    Procedure: ANTERIOR CERVICAL DECOMPRESSION/DISCECTOMY FUSION 3 LEVELS;  Surgeon: Elaina Hoops, MD;  Location: [facility];  Service: Neurosurgery;  Laterality: N/A;  Cervical Three-Four, Cervical Four-Five, Cervical Five-Six  Anterior Cervical Decompression Fusion   . CARDIAC CATHETERIZATION    . COLONOSCOPY  2001/2013  . CORONARY STENT INTERVENTION N/A 10/06/2018   Procedure: CORONARY STENT INTERVENTION;  Surgeon: Lorretta Harp, MD;  Location: [facility];  Service: Cardiovascular;  Laterality: N/A;  . ESOPHAGOGASTRODUODENOSCOPY    . LEFT HEART CATH AND CORONARY ANGIOGRAPHY N/A 10/06/2018   Procedure: LEFT HEART CATH AND CORONARY ANGIOGRAPHY;  Surgeon: Lorretta Harp, MD;  Location: [facility];  Service: Cardiovascular;  Laterality: N/A;  . POLYPECTOMY    . TONSILLECTOMY    . TONSILLECTOMY  at age 28    FAMHx:  Family History  Problem Relation Age of Onset  . Atrial fibrillation Mother   . Hyperlipidemia Mother   . Stroke Mother   . Heart disease Mother   . Atrial fibrillation Father   . Hypothyroidism Sister   . Hypothyroidism Maternal Grandmother   . Diabetes Maternal Grandmother   . Anesthesia problems Neg Hx   . Hypotension Neg Hx   . Malignant hyperthermia Neg Hx   . Pseudochol deficiency Neg Hx     SOCHx:   reports that she has quit smoking. She has never used smokeless tobacco. She reports current alcohol use. She reports that she does not use drugs.  ALLERGIES:  Allergies  Allergen Reactions  . Penicillins Hives, Itching and Rash    Did it involve swelling of the face/tongue/throat, SOB, or low BP? Unknown Did it involve sudden or severe rash/hives, skin peeling, or any reaction on the inside of your mouth or nose? Unknown Did you need to seek medical attention at a hospital or doctor's office? Unknown When did it last happen?pt cant recall If all above answers are "NO", may proceed with cephalosporin use.   . Rosuvastatin Other (See  Comments)    REACTION: muscle weakness  . Simvastatin Other (See Comments)    REACTION: muscle weakness  . Statins Other (See Comments)    Myalgias and weakness  . Sulfonamide Derivatives Other (See Comments)    Nausea, vomiting, abd pain  . Brilinta [Ticagrelor] Rash    Itchy rash to torso  . Sulfa Antibiotics Other (See Comments)    Nausea, vomiting, abd pain    ROS: Pertinent items noted in HPI and remainder of comprehensive ROS otherwise negative.  HOME MEDS: Current Outpatient Medications on File Prior to Visit  Medication Sig Dispense Refill  . aspirin 81 MG tablet Take 81 mg by mouth daily.      Marland Kitchen atorvastatin (LIPITOR) 10 MG tablet Take 1 tablet (10 mg total) by mouth daily. 90 tablet 3  . Calcium  Citrate (CITRACAL PO) Take 2 tablets by mouth daily.     . Cholecalciferol (VITAMIN D) 2000 UNITS CAPS Take 2,000 Units by mouth daily.    . clopidogrel (PLAVIX) 75 MG tablet Take 1 tablet (75 mg total) by mouth daily. 90 tablet 3  . esomeprazole (NEXIUM) 40 MG capsule Take 1 capsule (40 mg total) by mouth daily. 90 capsule 3  . icosapent Ethyl (VASCEPA) 1 g capsule Take 2 capsules (2 g total) by mouth 2 (two) times daily. 360 capsule 3  . metoprolol tartrate (LOPRESSOR) 25 MG tablet Take 0.5 tablets (12.5 mg total) by mouth 2 (two) times daily. 90 tablet 3  . metroNIDAZOLE (METROGEL) 0.75 % gel Apply 1 application topically at bedtime. 45 g 0  . Multiple Vitamin (MULITIVITAMIN WITH MINERALS) TABS Take 1 tablet by mouth daily.    . nitroGLYCERIN (NITROSTAT) 0.4 MG SL tablet Place 1 tablet (0.4 mg total) under the tongue every 5 (five) minutes x 3 doses as needed for chest pain. 25 tablet 12  . pimecrolimus (ELIDEL) 1 % cream Apply 1 application topically every morning. 30 g 0  . valsartan-hydrochlorothiazide (DIOVAN-HCT) 160-12.5 MG tablet Take 1 tablet by mouth daily. 90 tablet 3  . ezetimibe (ZETIA) 10 MG tablet Take 1 tablet (10 mg total) by mouth daily. 90 tablet 3   No  current facility-administered medications on file prior to visit.    LABS/IMAGING: No results found for this or any previous visit (from the past 48 hour(s)). No results found.  LIPID PANEL:    Component Value Date/Time   CHOL 171 09/04/2019 0812   TRIG 154 (H) 09/04/2019 0812   HDL 45 09/04/2019 0812   CHOLHDL 3.8 09/04/2019 0812   CHOLHDL 5.7 10/04/2018 0312   VLDL 56 (H) 10/04/2018 0312   LDLCALC 99 09/04/2019 0812   LDLDIRECT 177.0 06/05/2017 1301    WEIGHTS: Wt Readings from Last 3 Encounters:  09/10/19 176 lb (79.8 kg)  08/18/19 170 lb (77.1 kg)  08/16/19 170 lb (77.1 kg)    VITALS: BP 124/72   Pulse 71   Ht 5' 3.5" (1.613 m)   Wt 176 lb (79.8 kg)   SpO2 97%   BMI 30.69 kg/m   EXAM: Psych: Pleasant  EKG: Deferred  ASSESSMENT: 1. Mixed dyslipidemia, goal LDL <70 2. Relative statin intolerance 3. CAD with recent NSTEMI and PCI to the RCA (10/2018)  PLAN: 1.   Ms. Vanderwerf could not afford Vascepa and has been taking over-the-counter fish oil which I advised against.  I would recommend we increase her atorvastatin further to 20 mg daily.  Repeat lipids in 3 to 4 months and follow-up with me at that time.  Pixie Casino, MD, [facility], [place] Director of the [facility]  Card[facility] Diplomate of the American Board of Clinical Lipidology Attending Cardiologist  Direct Dial: [phone]  Fax: [phone]  Website:  [email or web address]  Nadean Corwin Siomara Burkel 09/10/2019, 8:20 AM

## 2019-09-11 ENCOUNTER — Ambulatory Visit: Payer: Medicare Other | Admitting: Cardiovascular Disease

## 2019-09-14 ENCOUNTER — Ambulatory Visit (INDEPENDENT_AMBULATORY_CARE_PROVIDER_SITE_OTHER): Payer: Medicare Other | Admitting: Physician Assistant

## 2019-09-14 ENCOUNTER — Encounter: Payer: Self-pay | Admitting: Physician Assistant

## 2019-09-14 VITALS — BP 122/76 | HR 81 | Ht 63.0 in | Wt 173.5 lb

## 2019-09-14 DIAGNOSIS — I2510 Atherosclerotic heart disease of native coronary artery without angina pectoris: Secondary | ICD-10-CM

## 2019-09-14 DIAGNOSIS — K219 Gastro-esophageal reflux disease without esophagitis: Secondary | ICD-10-CM

## 2019-09-14 DIAGNOSIS — R12 Heartburn: Secondary | ICD-10-CM

## 2019-09-14 MED ORDER — FAMOTIDINE 40 MG PO TABS
40.0000 mg | ORAL_TABLET | Freq: Every day | ORAL | 2 refills | Status: DC
Start: 2019-09-14 — End: 2019-10-06

## 2019-09-14 NOTE — Patient Instructions (Addendum)
If you are age 66 or older, your body mass index should be between 23-30. Your Body mass index is 30.73 kg/m. If this is out of the aforementioned range listed, please consider follow up with your Primary Care Provider.  If you are age 50 or younger, your body mass index should be between 19-25. Your Body mass index is 30.73 kg/m. If this is out of the aformentioned range listed, please consider follow up with your Primary Care Provider.   START Pepcid 40 mg 1 tablet nightly. This has been sent to your local pharmacy. Continue your Nexium as previously directed.  Call back in 2-3 weeks if you are not better ask to speak with Heather Merritt, Amy's nurse.  Follow up as needed.    Gastroesophageal Reflux Disease, Adult Gastroesophageal reflux (GER) happens when acid from the stomach flows up into the tube that connects the mouth and the stomach (esophagus). Normally, food travels down the esophagus and stays in the stomach to be digested. With GER, food and stomach acid sometimes move back up into the esophagus. You may have a disease called gastroesophageal reflux disease (GERD) if the reflux:  Happens often.  Causes frequent or very bad symptoms.  Causes problems such as damage to the esophagus. When this happens, the esophagus becomes sore and swollen (inflamed). Over time, GERD can make small holes (ulcers) in the lining of the esophagus. What are the causes? This condition is caused by a problem with the muscle between the esophagus and the stomach. When this muscle is weak or not normal, it does not close properly to keep food and acid from coming back up from the stomach. The muscle can be weak because of:  Tobacco use.  Pregnancy.  Having a certain type of hernia (hiatal hernia).  Alcohol use.  Certain foods and drinks, such as coffee, chocolate, onions, and peppermint. What increases the risk? You are more likely to develop this condition if you:  Are overweight.  Have a disease  that affects your connective tissue.  Use NSAID medicines. What are the signs or symptoms? Symptoms of this condition include:  Heartburn.  Difficult or painful swallowing.  The feeling of having a lump in the throat.  A bitter taste in the mouth.  Bad breath.  Having a lot of saliva.  Having an upset or bloated stomach.  Belching.  Chest pain. Different conditions can cause chest pain. Make sure you see your doctor if you have chest pain.  Shortness of breath or noisy breathing (wheezing).  Ongoing (chronic) cough or a cough at night.  Wearing away of the surface of teeth (tooth enamel).  Weight loss. How is this treated? Treatment will depend on how bad your symptoms are. Your doctor may suggest:  Changes to your diet.  Medicine.  Surgery. Follow these instructions at home: Eating and drinking   Follow a diet as told by your doctor. You may need to avoid foods and drinks such as: ? Coffee and tea (with or without caffeine). ? Drinks that contain alcohol. ? Energy drinks and sports drinks. ? Bubbly (carbonated) drinks or sodas. ? Chocolate and cocoa. ? Peppermint and mint flavorings. ? Garlic and onions. ? Horseradish. ? Spicy and acidic foods. These include peppers, chili powder, curry powder, vinegar, hot sauces, and BBQ sauce. ? Citrus fruit juices and citrus fruits, such as oranges, lemons, and limes. ? Tomato-based foods. These include red sauce, chili, salsa, and pizza with red sauce. ? Fried and fatty foods. These include  donuts, french fries, potato chips, and high-fat dressings. ? High-fat meats. These include hot dogs, rib eye steak, sausage, ham, and bacon. ? High-fat dairy items, such as whole milk, butter, and cream cheese.  Eat small meals often. Avoid eating large meals.  Avoid drinking large amounts of liquid with your meals.  Avoid eating meals during the 2-3 hours before bedtime.  Avoid lying down right after you eat.  Do not  exercise right after you eat. Lifestyle   Do not use any products that contain nicotine or tobacco. These include cigarettes, e-cigarettes, and chewing tobacco. If you need help quitting, ask your doctor.  Try to lower your stress. If you need help doing this, ask your doctor.  If you are overweight, lose an amount of weight that is healthy for you. Ask your doctor about a safe weight loss goal. General instructions  Pay attention to any changes in your symptoms.  Take over-the-counter and prescription medicines only as told by your doctor. Do not take aspirin, ibuprofen, or other NSAIDs unless your doctor says it is okay.  Wear loose clothes. Do not wear anything tight around your waist.  Raise (elevate) the head of your bed about 6 inches (15 cm).  Avoid bending over if this makes your symptoms worse.  Keep all follow-up visits as told by your doctor. This is important. Contact a doctor if:  You have new symptoms.  You lose weight and you do not know why.  You have trouble swallowing or it hurts to swallow.  You have wheezing or a cough that keeps happening.  Your symptoms do not get better with treatment.  You have a hoarse voice. Get help right away if:  You have pain in your arms, neck, jaw, teeth, or back.  You feel sweaty, dizzy, or light-headed.  You have chest pain or shortness of breath.  You throw up (vomit) and your throw-up looks like blood or coffee grounds.  You pass out (faint).  Your poop (stool) is bloody or black.  You cannot swallow, drink, or eat. Summary  If a person has gastroesophageal reflux disease (GERD), food and stomach acid move back up into the esophagus and cause symptoms or problems such as damage to the esophagus.  Treatment will depend on how bad your symptoms are.  Follow a diet as told by your doctor.  Take all medicines only as told by your doctor. This information is not intended to replace advice given to you by your  health care provider. Make sure you discuss any questions you have with your health care provider. Document Revised: 09/25/2017 Document Reviewed: 09/25/2017 Elsevier Patient Education  [employer].

## 2019-09-16 ENCOUNTER — Encounter: Payer: Self-pay | Admitting: Physician Assistant

## 2019-09-16 NOTE — Progress Notes (Signed)
Subjective:    Patient ID: Heather Merritt, female    DOB: [birth year], 64 y.o.   MRN: [id number]  HPI Heather Merritt is a pleasant 64 year old female, established with Dr. Henrene Pastor and last seen in our office in 2017.  She comes in today with complaints of chest pain which she thinks is related to GERD. She had undergone upper endoscopy in March 2017 which was normal exam and also had colonoscopy at that same time showing multiple diverticuli and no polyps. She has history of hyperlipidemia coronary artery disease and is status post MI.  She is maintained on aspirin and Plavix. She has history of chronic GERD and says she had been on generic Prevacid for many years which worked well but last year due to insurance changes she was required to switch to Nexium.  She is wondering if Nexium is just not as effective for her. She has over the past year had symptoms of intermittent burning and pressure in her chest which she equates to heartburn.   She says her cardiac symptoms are completely different.  She did have ER evaluation a few weeks ago with unremarkable EKG troponins etc. She went to the emergency room after a couple of weeks ago she had a particularly bad episode of reflux after breakfast which persisted for several hours, and decided to get evaluated.  She did receive Maalox in the emergency room which was helpful. She generally takes her Nexium in the morning. Now over the past 3 weeks she has had milder symptoms but has a persistent "acid a" sensation in her chest no matter what she eats or drinks.  Actually symptoms are at times better with eating.  She denies any dysphagia or odynophagia. No nocturnal symptoms. He does take a baby aspirin daily but no NSAIDs, no recent antibiotics new meds etc.  Review of Systems Pertinent positive and negative review of systems were noted in the above HPI section.  All other review of systems was otherwise negative.  Outpatient Encounter Medications as of  09/14/2019  Medication Sig  . aspirin 81 MG tablet Take 81 mg by mouth daily.    Marland Kitchen atorvastatin (LIPITOR) 20 MG tablet Take 1 tablet (20 mg total) by mouth daily.  . Calcium Citrate (CITRACAL PO) Take 2 tablets by mouth daily.   . Cholecalciferol (VITAMIN D) 2000 UNITS CAPS Take 2,000 Units by mouth daily.  . clopidogrel (PLAVIX) 75 MG tablet Take 1 tablet (75 mg total) by mouth daily.  Marland Kitchen esomeprazole (NEXIUM) 40 MG capsule Take 1 capsule (40 mg total) by mouth daily.  . metoprolol tartrate (LOPRESSOR) 25 MG tablet Take 0.5 tablets (12.5 mg total) by mouth 2 (two) times daily.  . metroNIDAZOLE (METROGEL) 0.75 % gel Apply 1 application topically at bedtime.  . Multiple Vitamin (MULITIVITAMIN WITH MINERALS) TABS Take 1 tablet by mouth daily.  . nitroGLYCERIN (NITROSTAT) 0.4 MG SL tablet Place 1 tablet (0.4 mg total) under the tongue every 5 (five) minutes x 3 doses as needed for chest pain.  . pimecrolimus (ELIDEL) 1 % cream Apply 1 application topically every morning.  . valsartan-hydrochlorothiazide (DIOVAN-HCT) 160-12.5 MG tablet Take 1 tablet by mouth daily.  Marland Kitchen ezetimibe (ZETIA) 10 MG tablet Take 1 tablet (10 mg total) by mouth daily.  . famotidine (PEPCID) 40 MG tablet Take 1 tablet (40 mg total) by mouth at bedtime.   No facility-administered encounter medications on file as of 09/14/2019.   Allergies  Allergen Reactions  . Penicillins Hives, Itching and Rash  Did it involve swelling of the face/tongue/throat, SOB, or low BP? Unknown Did it involve sudden or severe rash/hives, skin peeling, or any reaction on the inside of your mouth or nose? Unknown Did you need to seek medical attention at a hospital or doctor's office? Unknown When did it last happen?pt cant recall If all above answers are "NO", may proceed with cephalosporin use.   . Rosuvastatin Other (See Comments)    REACTION: muscle weakness  . Simvastatin Other (See Comments)    REACTION: muscle weakness  . Statins  Other (See Comments)    Myalgias and weakness  . Sulfonamide Derivatives Other (See Comments)    Nausea, vomiting, abd pain  . Brilinta [Ticagrelor] Rash    Itchy rash to torso  . Sulfa Antibiotics Other (See Comments)    Nausea, vomiting, abd pain   Patient Active Problem List   Diagnosis Date Noted  . Allergic rhinitis 08/18/2019  . Hyperglycemia 10/15/2018  . NSTEMI (non-ST elevated myocardial infarction) ([place]) 10/03/2018  . HLD (hyperlipidemia) 12/12/2017  . Microhematuria 06/05/2016  . Anterior neck pain 03/09/2016  . Vertigo 03/09/2016  . PAC (premature atrial contraction) 04/29/2015  . History of colonic polyps 04/29/2015  . Cervical radiculitis 12/21/2014  . Pain in the chest 04/15/2014  . Osteoporosis 02/27/2011  . Lumbar disc disease 02/27/2011  . Back pain 02/27/2011  . Encounter for well adult exam with abnormal findings 02/24/2011  . Presbyacusis 01/04/2010  . LIVER FUNCTION TESTS, ABNORMAL, HX OF 10/29/2007  . Anxiety state 10/28/2007  . Essential hypertension 10/28/2007  . GERD 10/28/2007  . DIVERTICULOSIS, COLON 10/28/2007   Social History   Socioeconomic History  . Marital status: Divorced    Spouse name: Not on file  . Number of children: 0  . Years of education: 1  . Highest education level: High school graduate  Occupational History  . Not on file  Tobacco Use  . Smoking status: Former Research scientist (life sciences)  . Smokeless tobacco: Never Used  . Tobacco comment: quit in 37yrs ago  Vaping Use  . Vaping Use: Never used  Substance and Sexual Activity  . Alcohol use: Yes    Alcohol/week: 0.0 standard drinks    Comment: occ beer/drink  . Drug use: No  . Sexual activity: Yes    Birth control/protection: Post-menopausal  Other Topics Concern  . Not on file  Social History Narrative  . Not on file   Social Determinants of Health   Financial Resource Strain: Low Risk   . Difficulty of Paying Living Expenses: Not hard at all  Food Insecurity: No Food  Insecurity  . Worried About Charity fundraiser in the Last Year: Never true  . Ran Out of Food in the Last Year: Never true  Transportation Needs: No Transportation Needs  . Lack of Transportation (Medical): No  . Lack of Transportation (Non-Medical): No  Physical Activity: Sufficiently Active  . Days of Exercise per Week: 5 days  . Minutes of Exercise per Session: 30 min  Stress: No Stress Concern Present  . Feeling of Stress : Only a little  Social Connections: Moderately Isolated  . Frequency of Communication with Friends and Family: Three times a week  . Frequency of Social Gatherings with Friends and Family: Never  . Attends Religious Services: 1 to 4 times per year  . Active Member of Clubs or Organizations: No  . Attends Archivist Meetings: Never  . Marital Status: Divorced  Human resources officer Violence: Unknown  . Fear of  Current or Ex-Partner: Patient refused  . Emotionally Abused: Patient refused  . Physically Abused: Patient refused  . Sexually Abused: Patient refused    Ms. Vallas family history includes Atrial fibrillation in her father and mother; Diabetes in her maternal grandmother; Heart disease in her mother; Hyperlipidemia in her mother; Hypothyroidism in her maternal grandmother and sister; Stroke in her mother.      Objective:    Vitals:   09/14/19 0838  BP: 122/76  Pulse: 81    Physical Exam Well-developed well-nourished female/female in no acute distress.  Height, Weight, BMI  HEENT; nontraumatic normocephalic, EOMI, PER R LA, sclera anicteric. Oropharynx; Neck; supple, no JVD Cardiovascular; regular rate and rhythm with S1-S2, no murmur rub or gallop Pulmonary; Clear bilaterally Abdomen; soft, nontender, nondistended, no palpable mass or hepatosplenomegaly, bowel sounds are active Rectal; not done Skin; benign exam, no jaundice rash or appreciable lesions Extremities; no clubbing cyanosis or edema skin warm and dry Neuro/Psych; alert  and oriented x4, grossly nonfocal mood and affect appropriate       Assessment & Plan:   #33  64 year old white female with history of chronic GERD, with 1 year history of intermittent refractory reflux type symptoms described as burning and pressure despite Nexium 40 mg p.o. every morning. Patient had a bad episode of reflux a few weeks ago after eating breakfast with symptoms that lingered all day resulting in ER visit with negative cardiac work-up. She does have history of coronary artery disease is status post MI and is maintained on aspirin and Plavix. She says her coronary symptoms are completely different. I suspect symptoms are secondary to refractory reflux, and possible component of reflux esophagitis. #2 colon cancer screening-up-to-date with negative colonoscopy 2017 3.  Diverticulosis  Plan; we discussed an antireflux diet and strict antireflux regimen including n.p.o. for 2 hours prior to bedtime elevation of the head of the bed 45 degrees. For now we will continue Nexium 40 mg p.o. every morning and will add Pepcid 40 mg every afternoon to be taking with dinner. Have asked patient to call back in 2 to 3 weeks, if she is not had significant improvement in symptoms will consider EGD and pH study. Tarquin Welcher Genia Harold PA-C 09/16/2019   Cc: Biagio Borg, MD

## 2019-10-01 ENCOUNTER — Other Ambulatory Visit: Payer: Self-pay

## 2019-10-01 ENCOUNTER — Ambulatory Visit
Admission: RE | Admit: 2019-10-01 | Discharge: 2019-10-01 | Disposition: A | Discharge status: Home / Self Care | Payer: Medicare Other | Source: Ambulatory Visit | Attending: Obstetrics | Admitting: Obstetrics

## 2019-10-01 DIAGNOSIS — M8580 Other specified disorders of bone density and structure, unspecified site: Secondary | ICD-10-CM

## 2019-10-06 ENCOUNTER — Other Ambulatory Visit: Payer: Self-pay | Admitting: Physician Assistant

## 2019-10-16 ENCOUNTER — Ambulatory Visit: Payer: Medicare Other | Admitting: Internal Medicine

## 2019-11-10 ENCOUNTER — Telehealth: Payer: Self-pay | Admitting: Cardiovascular Disease

## 2019-11-10 NOTE — Telephone Encounter (Signed)
Send to PharmD for med review

## 2019-11-10 NOTE — Telephone Encounter (Signed)
New Message  Pt c/o medication issue:  1. Name of Medication: Prednisone  2. How are you currently taking this medication (dosage and times per day)? Not taking yet, Neurologist wants patient to take for 6 days  3. Are you having a reaction (difficulty breathing--STAT)? No  4. What is your medication issue? Patient is calling in to see if medication would be okay to take due to other medications. Please give patient a call back.

## 2019-11-10 NOTE — Telephone Encounter (Signed)
Left detailed message for patient with pharmacist advice regarding medication

## 2019-11-10 NOTE — Telephone Encounter (Signed)
Spoke with patient about prednisone. She will be taking methyl prednisone tapered dose. She is concerned about potential side effects. Explained side effects as noted by [facility]. Advised she contact neurologist if she does not wish to take the medication, but that it is OK for a short course per our office.

## 2019-11-10 NOTE — Telephone Encounter (Signed)
Ok to take prednisone for 6 days. Side effects to look out for would be weight gain, increase in BP, and mood changes/insomnia (recommend she take her dose in the AM).

## 2019-11-27 ENCOUNTER — Other Ambulatory Visit: Payer: Self-pay | Admitting: Neurosurgery

## 2019-11-27 DIAGNOSIS — M542 Cervicalgia: Secondary | ICD-10-CM

## 2019-11-27 DIAGNOSIS — M5414 Radiculopathy, thoracic region: Secondary | ICD-10-CM

## 2019-12-01 ENCOUNTER — Telehealth: Payer: Self-pay | Admitting: Internal Medicine

## 2019-12-01 NOTE — Telephone Encounter (Signed)
Decrease lipitor back to 10 mg daily - continue zetia for now.  Dr Lemmie Evens

## 2019-12-01 NOTE — Telephone Encounter (Signed)
    Pt c/o medication issue:  1. Name of Medication: atorvastatin (LIPITOR) 20 MG tablet,   ezetimibe (ZETIA) 10 MG tablet    2. How are you currently taking this medication (dosage and times per day)?   3. Are you having a reaction (difficulty breathing--STAT)?   4. What is your medication issue? Pt said since Dr. Debara Pickett increased her Lipitor from 10 to 20 mg she felt a lot of pain, she said mostly her back hurts and her legs feel weak, she can't sit or lay down because of the pain. She said she will decrease her Lipitor back to 10 mg or probably will stop taking both medication all together.

## 2019-12-01 NOTE — Telephone Encounter (Signed)
OK to decrease to statin to 10mg  and continue zetia? Statin holiday?

## 2019-12-02 NOTE — Telephone Encounter (Signed)
Patient aware of MD advice. Will decrease atorvastatin dose. She plans to get fasting labs this month.

## 2019-12-08 ENCOUNTER — Telehealth: Payer: Self-pay | Admitting: Cardiovascular Disease

## 2019-12-08 NOTE — Telephone Encounter (Signed)
Patient states she had a synergy stent placed last in July of 2020. She wants to know if it would be safe for her to have an MRI done.

## 2019-12-08 NOTE — Telephone Encounter (Signed)
Spoke to patient Dr.Berry's advice given. 

## 2019-12-08 NOTE — Telephone Encounter (Signed)
Spoke to patient she stated she needs a MRI of cervical disc and thoracic disc.Advised I will send message to [facility] for advice.

## 2019-12-08 NOTE — Telephone Encounter (Signed)
OK to have an MRI

## 2019-12-24 ENCOUNTER — Ambulatory Visit
Admission: RE | Admit: 2019-12-24 | Discharge: 2019-12-24 | Disposition: A | Discharge status: Home / Self Care | Payer: Medicare Other | Source: Ambulatory Visit | Attending: Neurosurgery | Admitting: Neurosurgery

## 2019-12-24 ENCOUNTER — Other Ambulatory Visit: Payer: Self-pay

## 2019-12-24 DIAGNOSIS — M5414 Radiculopathy, thoracic region: Secondary | ICD-10-CM

## 2019-12-24 DIAGNOSIS — M542 Cervicalgia: Secondary | ICD-10-CM

## 2019-12-24 LAB — LIPID PANEL
Chol/HDL Ratio: 3.6 ratio (ref 0.0–4.4)
Cholesterol, Total: 158 mg/dL (ref 100–199)
HDL: 44 mg/dL (ref >39)
LDL Chol Calc (NIH): 83 mg/dL (ref 0–99)
Triglycerides: 181 mg/dL — ABNORMAL HIGH (ref 0–149)
VLDL Cholesterol Cal: 31 mg/dL (ref 5–40)

## 2020-01-11 ENCOUNTER — Emergency Department (HOSPITAL_COMMUNITY): Payer: Medicare Other

## 2020-01-11 ENCOUNTER — Emergency Department (HOSPITAL_COMMUNITY)
Admission: EM | Admit: 2020-01-11 | Discharge: 2020-01-11 | Disposition: A | Discharge status: Home / Self Care | Payer: Medicare Other | Attending: Emergency Medicine | Admitting: Emergency Medicine

## 2020-01-11 ENCOUNTER — Other Ambulatory Visit: Payer: Self-pay

## 2020-01-11 ENCOUNTER — Encounter (HOSPITAL_COMMUNITY): Payer: Self-pay | Admitting: Emergency Medicine

## 2020-01-11 DIAGNOSIS — R070 Pain in throat: Secondary | ICD-10-CM | POA: Diagnosis not present

## 2020-01-11 DIAGNOSIS — R079 Chest pain, unspecified: Secondary | ICD-10-CM | POA: Insufficient documentation

## 2020-01-11 DIAGNOSIS — Z5321 Procedure and treatment not carried out due to patient leaving prior to being seen by health care provider: Secondary | ICD-10-CM | POA: Diagnosis not present

## 2020-01-11 LAB — BASIC METABOLIC PANEL
Anion gap: 7 (ref 5–15)
BUN: 21 mg/dL (ref 8–23)
CO2: 29 mmol/L (ref 22–32)
Calcium: 9.4 mg/dL (ref 8.9–10.3)
Chloride: 101 mmol/L (ref 98–111)
Creatinine, Ser: 0.55 mg/dL (ref 0.44–1.00)
GFR, Estimated: >60 mL/min (ref >60)
Glucose, Bld: 99 mg/dL (ref 70–99)
Potassium: 4.2 mmol/L (ref 3.5–5.1)
Sodium: 137 mmol/L (ref 135–145)

## 2020-01-11 LAB — TROPONIN I (HIGH SENSITIVITY)
Troponin I (High Sensitivity): 3 ng/L (ref <18)
Troponin I (High Sensitivity): 3 ng/L (ref <18)

## 2020-01-11 LAB — CBC
HCT: 41.5 % (ref 36.0–46.0)
Hemoglobin: 13.4 g/dL (ref 12.0–15.0)
MCH: 30.3 pg (ref 26.0–34.0)
MCHC: 32.3 g/dL (ref 30.0–36.0)
MCV: 93.9 fL (ref 80.0–100.0)
Platelets: 259 10*3/uL (ref 150–400)
RBC: 4.42 MIL/uL (ref 3.87–5.11)
RDW: 12.1 % (ref 11.5–15.5)
WBC: 4.7 10*3/uL (ref 4.0–10.5)
nRBC: 0 % (ref 0.0–0.2)

## 2020-01-11 NOTE — ED Triage Notes (Signed)
Pt states about 2 hours ago she was reading a book and suddenly started having a burning in her throat pain in between her shoulder blades. Pt states she also noticed left sided chest pain that seems to come and go. Pt states she does have a cardiac stent and did have a recent event like this and everything seemed to check out ok.

## 2020-01-11 NOTE — ED Notes (Signed)
Pt leaving

## 2020-01-25 ENCOUNTER — Telehealth: Payer: Self-pay | Admitting: Cardiovascular Disease

## 2020-01-25 NOTE — Telephone Encounter (Signed)
error 

## 2020-02-08 ENCOUNTER — Other Ambulatory Visit: Payer: Self-pay | Admitting: Internal Medicine

## 2020-02-15 ENCOUNTER — Other Ambulatory Visit: Payer: Self-pay

## 2020-02-15 ENCOUNTER — Encounter: Payer: Self-pay | Admitting: Internal Medicine

## 2020-02-15 ENCOUNTER — Ambulatory Visit (INDEPENDENT_AMBULATORY_CARE_PROVIDER_SITE_OTHER): Payer: Medicare Other | Admitting: Internal Medicine

## 2020-02-15 VITALS — BP 120/68 | HR 74 | Temp 98.2°F | Ht 64.0 in | Wt 177.0 lb

## 2020-02-15 DIAGNOSIS — I10 Essential (primary) hypertension: Secondary | ICD-10-CM

## 2020-02-15 DIAGNOSIS — I2510 Atherosclerotic heart disease of native coronary artery without angina pectoris: Secondary | ICD-10-CM | POA: Diagnosis not present

## 2020-02-15 DIAGNOSIS — Z23 Encounter for immunization: Secondary | ICD-10-CM

## 2020-02-15 DIAGNOSIS — E782 Mixed hyperlipidemia: Secondary | ICD-10-CM | POA: Diagnosis not present

## 2020-02-15 DIAGNOSIS — F411 Generalized anxiety disorder: Secondary | ICD-10-CM

## 2020-02-15 DIAGNOSIS — R739 Hyperglycemia, unspecified: Secondary | ICD-10-CM | POA: Diagnosis not present

## 2020-02-15 NOTE — Patient Instructions (Addendum)
You had the flu shot today  Please remember to have your covid booster shot soon  Please continue all other medications as before, and refills have been done if requested.  Please have the pharmacy call with any other refills you may need.  Please continue your efforts at being more active, low cholesterol diet, and weight control.  You are otherwise up to date with prevention measures today.  Please keep your appointments with your specialists as you may have planned  Please make an Appointment to return in 6 months, or sooner if needed

## 2020-02-15 NOTE — Progress Notes (Signed)
Subjective:    Patient ID: Heather Merritt, female    DOB: [birth year], 64 y.o.   MRN: [id number]  HPI  Here to f/u; overall doing ok,  Pt denies chest pain, increasing sob or doe, wheezing, orthopnea, PND, increased LE swelling, palpitations, dizziness or syncope.  Pt denies new neurological symptoms such as new headache, or facial or extremity weakness or numbness.  Pt denies polydipsia, polyuria, or low sugar episode.  Pt states overall good compliance with meds, mostly trying to follow appropriate diet, with wt overall stable,  but little exercise however.  Has some leg pain with statin but will continue.  No new complaints.  Denies worsening depressive symptoms, suicidal ideation, or panic Past Medical History:  Diagnosis Date  . Allergy    SEASONAL  . ANXIETY 10/28/2007  . Arthritis   . CAD (coronary artery disease), native coronary artery    a. Cath 10/06/2018 - S/p DES to [facility]; medical therapy for high grade ostial/pro 2nd digonal   . Cataracts, bilateral    immature  . Chronic back pain   . Coronary artery disease   . DIVERTICULOSIS, COLON 10/28/2007  . Dry skin    red patch on right knee area  . GERD 10/28/2007   takes Prevacid daily  . H/O hiatal hernia   . HLD (hyperlipidemia) 12/12/2017  . Hx of colonic polyps   . HYPERLIPIDEMIA 10/28/2007   unable to take meds d/t allergies  . HYPERTENSION 10/28/2007   takes Diovan daily  . Internal hemorrhoids   . Irritable bowel syndrome (IBS)   . Joint pain   . Joint swelling   . LIVER FUNCTION TESTS, ABNORMAL, HX OF 10/29/2007  . Lumbar disc disease   . Migraine   . Myocardial infarction ([place])   . Obesity   . OSTEOPENIA 10/29/2007  . Osteoporosis   . Presbyacusis 01/04/2010  . Rosacea   . Seasonal allergies    takes Clarinex daily  . Vertigo    hx of  . Vitamin D deficiency    takes Vit D daily   Past Surgical History:  Procedure Laterality Date  . ANTERIOR CERVICAL DECOMP/DISCECTOMY FUSION  08/02/2011   Procedure: ANTERIOR  CERVICAL DECOMPRESSION/DISCECTOMY FUSION 3 LEVELS;  Surgeon: Elaina Hoops, MD;  Location: [place] [facility];  Service: Neurosurgery;  Laterality: N/A;  Cervical Three-Four, Cervical Four-Five, Cervical Five-Six  Anterior Cervical Decompression Fusion   . CARDIAC CATHETERIZATION    . COLONOSCOPY  2001/2013  . CORONARY STENT INTERVENTION N/A 10/06/2018   Procedure: CORONARY STENT INTERVENTION;  Surgeon: Lorretta Harp, MD;  Location: [place] [facility];  Service: Cardiovascular;  Laterality: N/A;  . ESOPHAGOGASTRODUODENOSCOPY    . LEFT HEART CATH AND CORONARY ANGIOGRAPHY N/A 10/06/2018   Procedure: LEFT HEART CATH AND CORONARY ANGIOGRAPHY;  Surgeon: Lorretta Harp, MD;  Location: [place] [facility];  Service: Cardiovascular;  Laterality: N/A;  . POLYPECTOMY    . TONSILLECTOMY    . TONSILLECTOMY  at age 1    reports that she has quit smoking. She has never used smokeless tobacco. She reports current alcohol use. She reports that she does not use drugs. family history includes Atrial fibrillation in her father and mother; Diabetes in her maternal grandmother; Heart disease in her mother; Hyperlipidemia in her mother; Hypothyroidism in her maternal grandmother and sister; Stroke in her mother. Allergies  Allergen Reactions  . Penicillins Hives, Itching and Rash    Did it involve swelling of the face/tongue/throat, SOB, or low  BP? Unknown Did it involve sudden or severe rash/hives, skin peeling, or any reaction on the inside of your mouth or nose? Unknown Did you need to seek medical attention at a hospital or doctor's office? Unknown When did it last happen?pt cant recall If all above answers are "NO", may proceed with cephalosporin use.   . Rosuvastatin Other (See Comments)    REACTION: muscle weakness  . Simvastatin Other (See Comments)    REACTION: muscle weakness  . Statins Other (See Comments)    Myalgias and weakness  . Sulfonamide Derivatives Other (See Comments)    Nausea,  vomiting, abd pain  . Brilinta [Ticagrelor] Rash    Itchy rash to torso  . Sulfa Antibiotics Other (See Comments)    Nausea, vomiting, abd pain   Current Outpatient Medications on File Prior to Visit  Medication Sig Dispense Refill  . aspirin 81 MG tablet Take 81 mg by mouth daily.      Marland Kitchen atorvastatin (LIPITOR) 20 MG tablet Take 20 mg by mouth daily.    . Calcium Citrate (CITRACAL PO) Take 2 tablets by mouth daily.     . Cholecalciferol (VITAMIN D) 2000 UNITS CAPS Take 2,000 Units by mouth daily.    . clopidogrel (PLAVIX) 75 MG tablet Take 1 tablet (75 mg total) by mouth daily. 90 tablet 3  . esomeprazole (NEXIUM) 40 MG capsule Take 1 capsule (40 mg total) by mouth daily. 90 capsule 3  . ezetimibe (ZETIA) 10 MG tablet TAKE 1 TABLET EVERY DAY 90 tablet 3  . famotidine (PEPCID) 40 MG tablet TAKE 1 TABLET BY MOUTH EVERYDAY AT BEDTIME 30 tablet 2  . metoprolol tartrate (LOPRESSOR) 25 MG tablet Take 0.5 tablets (12.5 mg total) by mouth 2 (two) times daily. 90 tablet 3  . metroNIDAZOLE (METROGEL) 0.75 % gel Apply 1 application topically at bedtime. 45 g 0  . Multiple Vitamin (MULITIVITAMIN WITH MINERALS) TABS Take 1 tablet by mouth daily.    . nitroGLYCERIN (NITROSTAT) 0.4 MG SL tablet Place 1 tablet (0.4 mg total) under the tongue every 5 (five) minutes x 3 doses as needed for chest pain. 25 tablet 12  . pimecrolimus (ELIDEL) 1 % cream Apply 1 application topically every morning. 30 g 0  . valsartan-hydrochlorothiazide (DIOVAN-HCT) 160-12.5 MG tablet Take 1 tablet by mouth daily. 90 tablet 3   No current facility-administered medications on file prior to visit.   Review of Systems All otherwise neg per pt    Objective:   Physical Exam BP 120/68 (BP Location: Left Arm, Patient Position: Sitting, Cuff Size: Large)   Pulse 74   Temp 98.2 F (36.8 C) (Oral)   Ht 5\' 4"  (1.626 m)   Wt 177 lb (80.3 kg)   SpO2 97%   BMI 30.38 kg/m  VS noted,  Constitutional: Pt appears in NAD HENT: Head:  NCAT.  Right Ear: External ear normal.  Left Ear: External ear normal.  Eyes: . Pupils are equal, round, and reactive to light. Conjunctivae and EOM are normal Nose: without d/c or deformity Neck: Neck supple. Gross normal ROM Cardiovascular: Normal rate and regular rhythm.   Pulmonary/Chest: Effort normal and breath sounds without rales or wheezing.  Abd:  Soft, NT, ND, + BS, no organomegaly Neurological: Pt is alert. At baseline orientation, motor grossly intact Skin: Skin is warm. No rashes, other new lesions, no LE edema Psychiatric: Pt behavior is normal without agitation  All otherwise neg per pt Lab Results  Component Value Date   WBC  4.7 01/11/2020   HGB 13.4 01/11/2020   HCT 41.5 01/11/2020   PLT 259 01/11/2020   GLUCOSE 99 01/11/2020   CHOL 158 12/24/2019   TRIG 181 (H) 12/24/2019   HDL 44 12/24/2019   LDLDIRECT 177.0 06/05/2017   LDLCALC 83 12/24/2019   ALT 34 (H) 05/01/2019   AST 24 05/01/2019   NA 137 01/11/2020   K 4.2 01/11/2020   CL 101 01/11/2020   CREATININE 0.55 01/11/2020   BUN 21 01/11/2020   CO2 29 01/11/2020   TSH 1.86 06/05/2017   INR 0.9 10/03/2018   HGBA1C 5.2 10/15/2018      Assessment & Plan:

## 2020-02-18 ENCOUNTER — Ambulatory Visit: Payer: Medicare Other | Admitting: Internal Medicine

## 2020-02-21 ENCOUNTER — Encounter: Payer: Self-pay | Admitting: Internal Medicine

## 2020-02-21 NOTE — Assessment & Plan Note (Addendum)
Declines a1c today, stable overall by history and exam, recent data reviewed with pt, and pt to continue medical treatment as before,  to f/u any worsening symptoms or concerns  I spent 31 minutes in preparing to see the patient by review of recent labs, imaging and procedures, obtaining and reviewing separately obtained history, communicating with the patient and family or caregiver, ordering medications, tests or procedures, and documenting clinical information in the EHR including the differential Dx, treatment, and any further evaluation and other management of hyperglycemia, hld, htn, anxiety

## 2020-02-21 NOTE — Assessment & Plan Note (Signed)
stable overall by history and exam, recent data reviewed with pt, and pt to continue medical treatment as before,  to f/u any worsening symptoms or concerns  

## 2020-03-01 ENCOUNTER — Encounter: Payer: Self-pay | Admitting: Cardiovascular Disease

## 2020-03-01 ENCOUNTER — Other Ambulatory Visit: Payer: Self-pay

## 2020-03-01 ENCOUNTER — Ambulatory Visit (INDEPENDENT_AMBULATORY_CARE_PROVIDER_SITE_OTHER): Payer: Medicare Other | Admitting: Cardiovascular Disease

## 2020-03-01 DIAGNOSIS — I214 Non-ST elevation (NSTEMI) myocardial infarction: Secondary | ICD-10-CM

## 2020-03-01 DIAGNOSIS — I10 Essential (primary) hypertension: Secondary | ICD-10-CM | POA: Diagnosis not present

## 2020-03-01 DIAGNOSIS — I2510 Atherosclerotic heart disease of native coronary artery without angina pectoris: Secondary | ICD-10-CM | POA: Diagnosis not present

## 2020-03-01 DIAGNOSIS — E782 Mixed hyperlipidemia: Secondary | ICD-10-CM | POA: Diagnosis not present

## 2020-03-01 MED ORDER — NITROGLYCERIN 0.4 MG SL SUBL: 0.4000 mg | SUBLINGUAL_TABLET | SUBLINGUAL | 12 refills | Status: DC | PRN

## 2020-03-01 NOTE — Progress Notes (Signed)
03/01/2020 Heather Merritt   [birth year]  [id number]  Primary Physician Biagio Borg, MD Primary Cardiologist: Lorretta Harp MD Lupe Carney, [place]  HPI:  Heather Merritt is a 64 y.o.  moderately overweight divorced Caucasian female no children who works as a Naval architect.  I first met her during her hospitalization in July 2020 for non-STEMI.  Her risk factors include hyperlipidemia intolerant to statin therapy, treated hypertension and family history with a mother who had PCI at age 34.  She is not a smoker.  She had chest pain on 10/03/2018 with positive enzymes.  Coronary CTA showed significant mid to distal dominant RCA stenosis.  I performed radial diagnostic cath on her 10/06/2018 revealing high-grade mid RCA stenosis.  I deployed a synergy 2.5 x 20 mm long drug-eluting stent post dilating with a 2.75 mm noncompliant balloon up to 2.8 mm.  She did have an ostial diagonal branch stenosis with normal LV function.  She was discharged home on 10/07/2018 on aspirin and Brilinta.  Unfortunately she could not tolerate Brilinta and was transitioned to clopidogrel.  She is also apparently statin intolerant and is seeing Dr. Debara Pickett for treatment of hyperlipidemia.  She is currently on low-dose atorvastatin and Zetia and I suspect will eventually be transitioned to [place].  Since I saw her a year ago she continues to do well.  She has gets occasional postprandial chest and back pain which sound like reflux to me.  She is on proton pump inhibitor.  She has developed some statin intolerance symptoms and is scheduled to see Dr. Debara Pickett in the upcoming future to discuss transitioning to [place].   Current Meds  Medication Sig  . aspirin 81 MG tablet Take 81 mg by mouth daily.    Marland Kitchen atorvastatin (LIPITOR) 10 MG tablet Take 10 mg by mouth daily.  . Calcium Citrate (CITRACAL PO) Take 2 tablets by mouth daily.   . Cholecalciferol (VITAMIN D) 2000 UNITS CAPS Take 2,000 Units by mouth daily.   . clopidogrel (PLAVIX) 75 MG tablet Take 1 tablet (75 mg total) by mouth daily.  Marland Kitchen esomeprazole (NEXIUM) 40 MG capsule Take 1 capsule (40 mg total) by mouth daily.  Marland Kitchen ezetimibe (ZETIA) 10 MG tablet TAKE 1 TABLET EVERY DAY  . famotidine (PEPCID) 40 MG tablet TAKE 1 TABLET BY MOUTH EVERYDAY AT BEDTIME (Patient taking differently: Take 40 mg by mouth as needed. )  . metoprolol tartrate (LOPRESSOR) 25 MG tablet Take 0.5 tablets (12.5 mg total) by mouth 2 (two) times daily.  . metroNIDAZOLE (METROGEL) 0.75 % gel Apply 1 application topically at bedtime.  . Multiple Vitamin (MULITIVITAMIN WITH MINERALS) TABS Take 1 tablet by mouth daily.  . nitroGLYCERIN (NITROSTAT) 0.4 MG SL tablet Place 1 tablet (0.4 mg total) under the tongue every 5 (five) minutes x 3 doses as needed for chest pain.  . pimecrolimus (ELIDEL) 1 % cream Apply 1 application topically every morning.  . valsartan-hydrochlorothiazide (DIOVAN-HCT) 160-12.5 MG tablet Take 1 tablet by mouth daily.  . [DISCONTINUED] atorvastatin (LIPITOR) 20 MG tablet Take 20 mg by mouth daily.     Allergies  Allergen Reactions  . Penicillins Hives, Itching and Rash    Did it involve swelling of the face/tongue/throat, SOB, or low BP? Unknown Did it involve sudden or severe rash/hives, skin peeling, or any reaction on the inside of your mouth or nose? Unknown Did you need to seek medical attention at a hospital or doctor's office? Unknown When  did it last happen?pt cant recall If all above answers are "NO", may proceed with cephalosporin use.   . Rosuvastatin Other (See Comments)    REACTION: muscle weakness  . Simvastatin Other (See Comments)    REACTION: muscle weakness  . Statins Other (See Comments)    Myalgias and weakness  . Sulfonamide Derivatives Other (See Comments)    Nausea, vomiting, abd pain  . Brilinta [Ticagrelor] Rash    Itchy rash to torso  . Sulfa Antibiotics Other (See Comments)    Nausea, vomiting, abd pain     Social History   Socioeconomic History  . Marital status: Divorced    Spouse name: Not on file  . Number of children: 0  . Years of education: 31  . Highest education level: High school graduate  Occupational History  . Not on file  Tobacco Use  . Smoking status: Former Research scientist (life sciences)  . Smokeless tobacco: Never Used  . Tobacco comment: quit in 59yr ago  Vaping Use  . Vaping Use: Never used  Substance and Sexual Activity  . Alcohol use: Yes    Alcohol/week: 0.0 standard drinks    Comment: occ beer/drink  . Drug use: No  . Sexual activity: Yes    Birth control/protection: Post-menopausal  Other Topics Concern  . Not on file  Social History Narrative  . Not on file   Social Determinants of Health   Financial Resource Strain:   . Difficulty of Paying Living Expenses: Not on file  Food Insecurity:   . Worried About RCharity fundraiserin the Last Year: Not on file  . Ran Out of Food in the Last Year: Not on file  Transportation Needs:   . Lack of Transportation (Medical): Not on file  . Lack of Transportation (Non-Medical): Not on file  Physical Activity:   . Days of Exercise per Week: Not on file  . Minutes of Exercise per Session: Not on file  Stress:   . Feeling of Stress : Not on file  Social Connections:   . Frequency of Communication with Friends and Family: Not on file  . Frequency of Social Gatherings with Friends and Family: Not on file  . Attends Religious Services: Not on file  . Active Member of Clubs or Organizations: Not on file  . Attends CArchivistMeetings: Not on file  . Marital Status: Not on file  Intimate Partner Violence:   . Fear of Current or Ex-Partner: Not on file  . Emotionally Abused: Not on file  . Physically Abused: Not on file  . Sexually Abused: Not on file     Review of Systems: General: negative for chills, fever, night sweats or weight changes.  Cardiovascular: negative for chest pain, dyspnea on exertion, edema,  orthopnea, palpitations, paroxysmal nocturnal dyspnea or shortness of breath Dermatological: negative for rash Respiratory: negative for cough or wheezing Urologic: negative for hematuria Abdominal: negative for nausea, vomiting, diarrhea, bright red blood per rectum, melena, or hematemesis Neurologic: negative for visual changes, syncope, or dizziness All other systems reviewed and are otherwise negative except as noted above.    Blood pressure 122/70, pulse 77, height _0  (1.626 m), weight 177 lb (80.3 kg).  General appearance: alert and no distress Neck: no adenopathy, no carotid bruit, no JVD, supple, symmetrical, trachea midline and thyroid not enlarged, symmetric, no tenderness/mass/nodules Lungs: clear to auscultation bilaterally Heart: regular rate and rhythm, S1, S2 normal, no murmur, click, rub or gallop Extremities: extremities normal, atraumatic, no  cyanosis or edema Pulses: 2+ and symmetric Skin: Skin color, texture, turgor normal. No rashes or lesions Neurologic: Alert and oriented X 3, normal strength and tone. Normal symmetric reflexes. Normal coordination and gait  EKG not performed today  ASSESSMENT AND PLAN:   Essential hypertension History of essential hypertension a blood pressure measured today 122/70.  She is on metoprolol, valsartan and hydrochlorothiazide.  HLD (hyperlipidemia) History of hyperlipidemia, probably familial, on atorvastatin and Zetia with LDL still measured at 83 on 12/24/2019.  She does have some statin intolerance symptoms at higher doses.  She has an upcoming appointment with Dr. Debara Pickett to discuss Repatha.  NSTEMI (non-ST elevated myocardial infarction) ([place]) History of CAD status post non-STEMI with diagnostic cath performed by myself 10/06/2018 via the right radial approach revealing a high-grade mid RCA stenosis which I stented using a 2.5 x 20 mm long Synergy drug-eluting stent postdilated to 2.8 mm.  She did have some ostial diagonal branch  disease but no other significant CAD.  She has complained of some postprandial chest and back pain which sounds like reflux to me on proton pump inhibitors.  She remains on aspirin and clopidogrel.      Lorretta Harp MD FACP,FACC,FAHA, [facility] 03/01/2020 3:17 PM

## 2020-03-01 NOTE — Assessment & Plan Note (Signed)
History of essential hypertension a blood pressure measured today 122/70.  She is on metoprolol, valsartan and hydrochlorothiazide.

## 2020-03-01 NOTE — Patient Instructions (Signed)

## 2020-03-01 NOTE — Assessment & Plan Note (Signed)
History of CAD status post non-STEMI with diagnostic cath performed by myself 10/06/2018 via the right radial approach revealing a high-grade mid RCA stenosis which I stented using a 2.5 x 20 mm long Synergy drug-eluting stent postdilated to 2.8 mm.  She did have some ostial diagonal branch disease but no other significant CAD.  She has complained of some postprandial chest and back pain which sounds like reflux to me on proton pump inhibitors.  She remains on aspirin and clopidogrel.

## 2020-03-01 NOTE — Assessment & Plan Note (Signed)
History of hyperlipidemia, probably familial, on atorvastatin and Zetia with LDL still measured at 83 on 12/24/2019.  She does have some statin intolerance symptoms at higher doses.  She has an upcoming appointment with Dr. Debara Pickett to discuss Repatha.

## 2020-03-17 DIAGNOSIS — Z6831 Body mass index (BMI) 31.0-31.9, adult: Secondary | ICD-10-CM | POA: Insufficient documentation

## 2020-03-31 ENCOUNTER — Telehealth: Payer: Self-pay | Admitting: Internal Medicine

## 2020-03-31 DIAGNOSIS — E785 Hyperlipidemia, unspecified: Secondary | ICD-10-CM

## 2020-03-31 NOTE — Telephone Encounter (Signed)
Lipid panel ordered Lab reminder sent 

## 2020-04-08 LAB — LIPID PANEL
Chol/HDL Ratio: 3.5 ratio (ref 0.0–4.4)
Cholesterol, Total: 187 mg/dL (ref 100–199)
HDL: 54 mg/dL (ref >39)
LDL Chol Calc (NIH): 106 mg/dL — ABNORMAL HIGH (ref 0–99)
Triglycerides: 156 mg/dL — ABNORMAL HIGH (ref 0–149)
VLDL Cholesterol Cal: 27 mg/dL (ref 5–40)

## 2020-04-11 ENCOUNTER — Encounter: Payer: Self-pay | Admitting: Internal Medicine

## 2020-04-11 ENCOUNTER — Telehealth (INDEPENDENT_AMBULATORY_CARE_PROVIDER_SITE_OTHER): Payer: Medicare Other | Admitting: Internal Medicine

## 2020-04-11 VITALS — BP 130/80 | HR 75 | Ht 63.5 in | Wt 175.0 lb

## 2020-04-11 DIAGNOSIS — T466X5A Adverse effect of antihyperlipidemic and antiarteriosclerotic drugs, initial encounter: Secondary | ICD-10-CM

## 2020-04-11 DIAGNOSIS — I2510 Atherosclerotic heart disease of native coronary artery without angina pectoris: Secondary | ICD-10-CM | POA: Diagnosis not present

## 2020-04-11 DIAGNOSIS — M791 Myalgia, unspecified site: Secondary | ICD-10-CM | POA: Diagnosis not present

## 2020-04-11 DIAGNOSIS — E785 Hyperlipidemia, unspecified: Secondary | ICD-10-CM

## 2020-04-11 DIAGNOSIS — I10 Essential (primary) hypertension: Secondary | ICD-10-CM

## 2020-04-11 MED ORDER — REPATHA SURECLICK 140 MG/ML ~~LOC~~ SOAJ: 1.0000 | SUBCUTANEOUS | 11 refills | Status: DC

## 2020-04-11 MED ORDER — REPATHA 140 MG/ML ~~LOC~~ SOSY: 140.0000 mg | PREFILLED_SYRINGE | SUBCUTANEOUS | 6 refills | Status: DC

## 2020-04-11 NOTE — Progress Notes (Signed)
Virtual Visit via Video Note   This visit type was conducted due to national recommendations for restrictions regarding the COVID-19 Pandemic (e.g. social distancing) in an effort to limit this patient's exposure and mitigate transmission in our community.  Due to her co-morbid illnesses, this patient is at least at moderate risk for complications without adequate follow up.  This format is felt to be most appropriate for this patient at this time.  All issues noted in this document were discussed and addressed.  A limited physical exam was performed with this format.  Please refer to the patient's chart for her consent to telehealth for [facility].   Caregility video visit  Date:  04/11/2020   ID:  Heather Merritt, DOB 09-15-1955, MRN [id number] The patient was identified using 2 identifiers.  Evaluation Performed:  Follow-Up Visit  Patient Location:  [address] [place] [address]  Provider location:   [address], [address] [place], [place] [address]  PCP:  Corwin Levins, MD  Cardiologist:  Nanetta Batty, MD Electrophysiologist:  None   Chief Complaint:  Dyslipidemia  History of Present Illness:    Heather Merritt is a 65 y.o. female who presents via audio/video conferencing for a telehealth visit today. This is a 65 year old female patient of Dr. Gery Pray who was referred by Azalee Course, PA-C, for management of dyslipidemia.  Unfortunately she recently had chest pain was found to have a non-STEMI.  Cath in July 2020 demonstrated some coronary disease specifically in the right coronary artery requiring a drug-eluting stent placement.  Since then she has done very well without recurrent chest pain.  She started cardiac rehabilitation.  She does have a longstanding history of dyslipidemia including family history of coronary disease.  She had previously been on a number of different statin medications including atorvastatin, rosuvastatin, simvastatin, pravastatin and others  with significant myalgias, particularly at the higher doses.  She had been taking atorvastatin 10 mg every other day prior to this event and was convinced to take it on a daily basis afterwards.  So far that seems to be reasonably well tolerated with some mild myalgias.  She has not had reassessment of her cholesterol since July however her total cholesterol the time was 204, triglycerides 280, HDL 36 and LDL of 112.  05/08/2019  Heather Merritt returns today for follow-up.  Overall she is doing fairly well.  Her cholesterol has improved on the Zetia.  LDL is down to 81 from 113.  Total cholesterol is 158 with triglycerides increased to 167.  This is been high in the past.  She thinks is mostly dietary related.  She is on over-the-counter fish oil which I have advised against due to lack of trial data on this.  We discussed further at length today about Vascepa and how this could be a good addition to her regimen because of the significant cardiovascular risk reduction that was seen in the reduce it trial.  09/10/2019  Heather Merritt is seen today in follow-up.  Unfortunately she could not afford Vascepa but did make some dietary changes.  She started taking over-the-counter fish oil.  She noted that her LDL cholesterol is actually gone now up and her triglycerides though have come down.  Most recent lipid showed total cholesterol 171, triglycerides 154 and LDL of 99.  I advised that there was no data for cardiovascular risk reduction with over-the-counter fish oils and I would recommend not taking it.  Since she cannot afford the  Vascepa, we will have to focus on better LDL control.  She is only on low-dose atorvastatin due to side effects.   04/11/2020  Heather Merritt is seen today in follow-up.  Although we have tried to increase her atorvastatin further she seems to have side effects on higher doses.  She can currently tolerate 10 mg of atorvastatin and 10 mg of ezetimibe daily.  Unfortunately her  cholesterol has risen somewhat.  About 3 months ago her LDL was 83 but labs 4 days ago showed increase up to 106 with total cholesterol 187, triglycerides 156 and HDL 54.  Her target LDL is less than 70.  She recently met with her primary cardiologist and they discussed the possibility of her starting on a PCSK9 inhibitor.  The patient does not have symptoms concerning for COVID-19 infection (fever, chills, cough, or new SHORTNESS OF BREATH).    Prior CV studies:   The following studies were reviewed today:  Chart reviewed, lab work  PMHx:  Past Medical History:  Diagnosis Date  . Allergy    SEASONAL  . ANXIETY 10/28/2007  . Arthritis   . CAD (coronary artery disease), native coronary artery    a. Cath 10/06/2018 - S/p DES to [facility]; medical therapy for high grade ostial/pro 2nd digonal   . Cataracts, bilateral    immature  . Chronic back pain   . Coronary artery disease   . DIVERTICULOSIS, COLON 10/28/2007  . Dry skin    red patch on right knee area  . GERD 10/28/2007   takes Prevacid daily  . H/O hiatal hernia   . HLD (hyperlipidemia) 12/12/2017  . Hx of colonic polyps   . HYPERLIPIDEMIA 10/28/2007   unable to take meds d/t allergies  . HYPERTENSION 10/28/2007   takes Diovan daily  . Internal hemorrhoids   . Irritable bowel syndrome (IBS)   . Joint pain   . Joint swelling   . LIVER FUNCTION TESTS, ABNORMAL, HX OF 10/29/2007  . Lumbar disc disease   . Migraine   . Myocardial infarction (HCC)   . Obesity   . OSTEOPENIA 10/29/2007  . Osteoporosis   . Presbyacusis 01/04/2010  . Rosacea   . Seasonal allergies    takes Clarinex daily  . Vertigo    hx of  . Vitamin D deficiency    takes Vit D daily    Past Surgical History:  Procedure Laterality Date  . ANTERIOR CERVICAL DECOMP/DISCECTOMY FUSION  08/02/2011   Procedure: ANTERIOR CERVICAL DECOMPRESSION/DISCECTOMY FUSION 3 LEVELS;  Surgeon: Mariam Dollar, MD;  Location: [facility] NEURO ORS;  Service: Neurosurgery;  Laterality: N/A;   Cervical Three-Four, Cervical Four-Five, Cervical Five-Six  Anterior Cervical Decompression Fusion   . CARDIAC CATHETERIZATION    . COLONOSCOPY  2001/2013  . CORONARY STENT INTERVENTION N/A 10/06/2018   Procedure: CORONARY STENT INTERVENTION;  Surgeon: Runell Gess, MD;  Location: [facility] INVASIVE CV LAB;  Service: Cardiovascular;  Laterality: N/A;  . ESOPHAGOGASTRODUODENOSCOPY    . LEFT HEART CATH AND CORONARY ANGIOGRAPHY N/A 10/06/2018   Procedure: LEFT HEART CATH AND CORONARY ANGIOGRAPHY;  Surgeon: Runell Gess, MD;  Location: [facility] INVASIVE CV LAB;  Service: Cardiovascular;  Laterality: N/A;  . POLYPECTOMY    . TONSILLECTOMY    . TONSILLECTOMY  at age 31    FAMHx:  Family History  Problem Relation Age of Onset  . Atrial fibrillation Mother   . Hyperlipidemia Mother   . Stroke Mother   . Heart disease Mother   .  Atrial fibrillation Father   . Hypothyroidism Sister   . Hypothyroidism Maternal Grandmother   . Diabetes Maternal Grandmother   . Anesthesia problems Neg Hx   . Hypotension Neg Hx   . Malignant hyperthermia Neg Hx   . Pseudochol deficiency Neg Hx   . Colon cancer Neg Hx   . Stomach cancer Neg Hx   . Rectal cancer Neg Hx   . Esophageal cancer Neg Hx     SOCHx:   reports that she has quit smoking. She has never used smokeless tobacco. She reports current alcohol use. She reports that she does not use drugs.  ALLERGIES:  Allergies  Allergen Reactions  . Penicillins Hives, Itching and Rash    Did it involve swelling of the face/tongue/throat, SOB, or low BP? Unknown Did it involve sudden or severe rash/hives, skin peeling, or any reaction on the inside of your mouth or nose? Unknown Did you need to seek medical attention at a hospital or doctor's office? Unknown When did it last happen?pt cant recall If all above answers are "NO", may proceed with cephalosporin use.   . Rosuvastatin Other (See Comments)    REACTION: muscle weakness  . Simvastatin Other  (See Comments)    REACTION: muscle weakness  . Statins Other (See Comments)    Myalgias and weakness  . Sulfonamide Derivatives Other (See Comments)    Nausea, vomiting, abd pain  . Brilinta [Ticagrelor] Rash    Itchy rash to torso  . Doxycycline Hyclate Itching and Rash  . Sulfa Antibiotics Other (See Comments)    Nausea, vomiting, abd pain    MEDS:  Current Meds  Medication Sig  . aspirin 81 MG tablet Take 81 mg by mouth daily.  Marland Kitchen atorvastatin (LIPITOR) 10 MG tablet Take 10 mg by mouth daily.  . Calcium Citrate (CITRACAL PO) Take 2 tablets by mouth daily.   . Cholecalciferol (VITAMIN D) 2000 UNITS CAPS Take 2,000 Units by mouth daily.  . clopidogrel (PLAVIX) 75 MG tablet Take 1 tablet (75 mg total) by mouth daily.  Marland Kitchen esomeprazole (NEXIUM) 40 MG capsule Take 1 capsule (40 mg total) by mouth daily.  . Evolocumab (REPATHA SURECLICK) 643 MG/ML SOAJ Inject 1 Dose into the skin every 14 (fourteen) days.  Marland Kitchen ezetimibe (ZETIA) 10 MG tablet TAKE 1 TABLET EVERY DAY  . metoprolol tartrate (LOPRESSOR) 25 MG tablet Take 0.5 tablets (12.5 mg total) by mouth 2 (two) times daily.  . metroNIDAZOLE (METROGEL) 0.75 % gel Apply 1 application topically at bedtime.  . Multiple Vitamin (MULITIVITAMIN WITH MINERALS) TABS Take 1 tablet by mouth daily.  . nitroGLYCERIN (NITROSTAT) 0.4 MG SL tablet Place 1 tablet (0.4 mg total) under the tongue every 5 (five) minutes x 3 doses as needed for chest pain.  . pimecrolimus (ELIDEL) 1 % cream Apply 1 application topically every morning.  . valsartan-hydrochlorothiazide (DIOVAN-HCT) 160-12.5 MG tablet Take 1 tablet by mouth daily.  . [DISCONTINUED] Evolocumab (REPATHA) 140 MG/ML SOSY Inject 140 mg into the skin every 14 (fourteen) days.     ROS: Pertinent items noted in HPI and remainder of comprehensive ROS otherwise negative.  Labs/Other Tests and Data Reviewed:    Recent Labs: 05/01/2019: ALT 34 01/11/2020: BUN 21; Creatinine, Ser 0.55; Hemoglobin 13.4;  Platelets 259; Potassium 4.2; Sodium 137   Recent Lipid Panel Lab Results  Component Value Date/Time   CHOL 187 04/07/2020 12:14 PM   TRIG 156 (H) 04/07/2020 12:14 PM   HDL 54 04/07/2020 12:14 PM   CHOLHDL  3.5 04/07/2020 12:14 PM   CHOLHDL 5.7 10/04/2018 03:12 AM   LDLCALC 106 (H) 04/07/2020 12:14 PM   LDLDIRECT 177.0 06/05/2017 01:01 PM    Wt Readings from Last 3 Encounters:  04/11/20 175 lb (79.4 kg)  03/01/20 177 lb (80.3 kg)  02/15/20 177 lb (80.3 kg)     Exam:    Vital Signs:  BP 130/80   Pulse 75   Ht 5' 3.5" (1.613 m)   Wt 175 lb (79.4 kg)   BMI 30.51 kg/m    General appearance: alert and no distress Lungs: No visual respiratory difficulty Abdomen: Mildly obese Extremities: extremities normal, atraumatic, no cyanosis or edema Skin: Skin color, texture, turgor normal. No rashes or lesions Neurologic: Mental status: Alert, oriented, thought content appropriate Psych: Pleasant  ASSESSMENT & PLAN:    1. Mixed dyslipidemia, goal LDL <70 2. Relative statin intolerance 3. CAD with recent NSTEMI and PCI to the RCA (10/2018)  Heather Merritt has a mixed dyslipidemia remains well above goal LDL less than 70.  She cannot tolerate any higher doses of atorvastatin and recently had to go back down to the 10 mg daily dose.  She is on ezetimibe as well.  Given her coronary artery disease with prior PCI, her target is at 12 or lower and she will not likely reach this without additional therapy.  I am recommending adding Repatha 140 mg every 2 weeks for additional lipid lowering and cardiovascular risk reduction.  We will check for prior authorization and plan to repeat lipids in about 3 to 4 months after she starts taking it.  She is interested in contacting our office for [place] to observe her performing the first injection.  COVID-19 Education: The signs and symptoms of COVID-19 were discussed with the patient and how to seek care for testing (follow up with PCP or arrange E-visit).   The importance of social distancing was discussed today.  Patient Risk:   After full review of this patients clinical status, I feel that they are at least moderate risk at this time.  Time:   Today, I have spent 25 minutes with the patient with telehealth technology discussing dyslipidemia, target LDL, statin side effects.     Medication Adjustments/Labs and Tests Ordered: Current medicines are reviewed at length with the patient today.  Concerns regarding medicines are outlined above.   Tests Ordered: Orders Placed This Encounter  Procedures  . Lipid panel    Medication Changes: Meds ordered this encounter  Medications  . DISCONTD: Evolocumab (REPATHA) 140 MG/ML SOSY    Sig: Inject 140 mg into the skin every 14 (fourteen) days.    Dispense:  2.1 mL    Refill:  6  . Evolocumab (REPATHA SURECLICK) 283 MG/ML SOAJ    Sig: Inject 1 Dose into the skin every 14 (fourteen) days.    Dispense:  2 mL    Refill:  11    Dispense SureClick pen    Disposition:  in 3 month(s)  Pixie Casino, MD, [facility], [place] Director of the Advanced Lipid Disorders &  Cardiovascular Risk Reduction Clinic Diplomate of the American Board of Clinical Lipidology Attending Cardiologist  Direct Dial: [phone]  Fax: [phone]  Website:  [email or web address]  Pixie Casino, MD  04/11/2020 1:48 PM

## 2020-04-11 NOTE — Patient Instructions (Signed)
Medication Instructions:  START: Repatha injection- one dose every 14 days Dr. Debara Pickett recommends Repatha (PCSK9). This is an injectable cholesterol medication self-administered once every 14 days. This medication will likely need prior approval with your insurance company, which we will work on. If the medication is not approved initially, we may need to do an appeal with your insurance.   Administer medication in area of fatty tissue such as abdomen, outer thigh, back of upper arm - and rotate site with each injection Store medication in refrigerator until ready to administer - allow to sit at room temp for 30 mins - 1 hour prior to injection Dispose of medication in a SHARPS container - your pharmacy should be able to direct you on this and proper disposal   If you need co-pay assistance grant, please look into the program at healthwellfoundation.org >> disease funds >> hypercholesterolemia. This is an online application or you can call to complete. Once approved, you will provide the "pharmacy card" information to your pharmacy and they will deduct the co-pays from this grant.  If you need a co-pay card for Repatha: [email or web address] >> paying for Repatha or red box that says "Repatha Copay Card" in top right If you need a co-pay card for Praluent: WedMap.it >> starting & paying for Praluent *If you need a refill on your cardiac medications before your next appointment, please call your pharmacy*  Lab Work: LIPID PANEL- in 4 months  If you have labs (blood work) drawn today and your tests are completely normal, you will receive your results only by: Marland Kitchen MyChart Message (if you have MyChart) OR . A paper copy in the mail If you have any lab test that is abnormal or we need to change your treatment, we will call you to review the results.  Follow-Up: At [facility] [place], you and your health needs are our priority.  As part of our continuing mission to provide you with exceptional heart care, we have  created designated Provider Care Teams.  These Care Teams include your primary Cardiologist (physician) and Advanced Practice Providers (APPs -  Physician Assistants and Nurse Practitioners) who all work together to provide you with the care you need, when you need it.  Your next appointment:   4 months   The format for your next appointment:   In Person  Provider:   K. Mali Hilty, MD

## 2020-04-12 ENCOUNTER — Telehealth: Payer: Self-pay | Admitting: Internal Medicine

## 2020-04-12 NOTE — Telephone Encounter (Signed)
PA for repatha sureclick submitted via CMM (Key: [id number]) PA Case: [id number], Status: Approved, Coverage Starts on: 04/12/2020 12:00:00 AM, Coverage Ends on: 10/09/2020 12:00:00 AM. Questions? Contact [phone].

## 2020-05-05 ENCOUNTER — Other Ambulatory Visit: Payer: Self-pay

## 2020-05-05 ENCOUNTER — Encounter: Payer: Self-pay | Admitting: Physician Assistant

## 2020-05-05 ENCOUNTER — Ambulatory Visit (INDEPENDENT_AMBULATORY_CARE_PROVIDER_SITE_OTHER): Payer: Medicare Other | Admitting: Physician Assistant

## 2020-05-05 VITALS — BP 132/80 | HR 69 | Ht 63.5 in | Wt 175.6 lb

## 2020-05-05 DIAGNOSIS — R1310 Dysphagia, unspecified: Secondary | ICD-10-CM

## 2020-05-05 DIAGNOSIS — K219 Gastro-esophageal reflux disease without esophagitis: Secondary | ICD-10-CM | POA: Diagnosis not present

## 2020-05-05 DIAGNOSIS — K648 Other hemorrhoids: Secondary | ICD-10-CM | POA: Diagnosis not present

## 2020-05-05 DIAGNOSIS — I2510 Atherosclerotic heart disease of native coronary artery without angina pectoris: Secondary | ICD-10-CM

## 2020-05-05 MED ORDER — FAMOTIDINE 40 MG PO TABS: 40.0000 mg | ORAL_TABLET | ORAL | 4 refills | Status: DC | PRN

## 2020-05-05 MED ORDER — SUCRALFATE 1 GM/10ML PO SUSP: 1.0000 g | Freq: Three times a day (TID) | ORAL | 0 refills | Status: DC

## 2020-05-05 MED ORDER — HYDROCORTISONE ACETATE 25 MG RE SUPP: 25.0000 mg | Freq: Every day | RECTAL | 0 refills | Status: AC

## 2020-05-05 MED ORDER — ESOMEPRAZOLE MAGNESIUM 40 MG PO CPDR
40.0000 mg | DELAYED_RELEASE_CAPSULE | Freq: Every day | ORAL | 4 refills | Status: DC
Start: 2020-05-05 — End: 2020-09-13

## 2020-05-05 NOTE — Patient Instructions (Addendum)
If you are age 67 or older, your body mass index should be between 23-30. Your Body mass index is 30.62 kg/m. If this is out of the aforementioned range listed, please consider follow up with your Primary Care Provider.  If you are age 67 or younger, your body mass index should be between 19-25. Your Body mass index is 30.62 kg/m. If this is out of the aformentioned range listed, please consider follow up with your Primary Care Provider.   You have been scheduled for a Barium Esophogram at [facility] Radiology (1st floor of the hospital) on 05/12/2065 at 10:30. Please arrive 30 minutes prior to your appointment for registration. Make certain not to have anything to eat or drink 3 hours prior to your test. If you need to reschedule for any reason, please contact radiology at [phone] to do so. __________________________________________________________________ A barium swallow is an examination that concentrates on views of the esophagus. This tends to be a double contrast exam (barium and two liquids which, when combined, create a gas to distend the wall of the oesophagus) or single contrast (non-ionic iodine based). The study is usually tailored to your symptoms so a good history is essential. Attention is paid during the study to the form, structure and configuration of the esophagus, looking for functional disorders (such as aspiration, dysphagia, achalasia, motility and reflux) EXAMINATION You may be asked to change into a gown, depending on the type of swallow being performed. A radiologist and radiographer will perform the procedure. The radiologist will advise you of the type of contrast selected for your procedure and direct you during the exam. You will be asked to stand, sit or lie in several different positions and to hold a small amount of fluid in your mouth before being asked to swallow while the imaging is performed .In some instances you may be asked to swallow barium coated  marshmallows to assess the motility of a solid food bolus. The exam can be recorded as a digital or video fluoroscopy procedure. POST PROCEDURE It will take 1-2 days for the barium to pass through your system. To facilitate this, it is important, unless otherwise directed, to increase your fluids for the next 24-48hrs and to resume your normal diet.  This test typically takes about 30 minutes to perform. __________________________________________________________________________________   We have sent the the following Medications to your Pharmacy Nexium, Famotidine Anusol Suppositories, 1 suppository at bedtime for 7 days as needed for hemorrhoidal symptoms Carafate suspension 1 gram between meals and at  Bedtime for 2 weeks.  Follow up pending.  Thank you for entrusting me with your care and choosing [facility].  Amy Esterwood, PA-C

## 2020-05-05 NOTE — Progress Notes (Signed)
Subjective:    Patient ID: Heather Merritt, female    DOB: 05/02/1955, 65 y.o.   MRN: [id number]  HPI Heather Merritt is a pleasant 65 year old white female, established with Dr. Henrene Pastor, who comes in today with complaints of recent odynophagia which has been occurring intermittently while eating.  Patient says symptoms started about a month ago and have been intermittent and over the past week actually better.  She describes a pressure sensation in the mid chest with some sense of radiation to the back which has occurred with swallowing at times.  She did have some episodes that required her to stop eating she had not had any regurgitation.  She worries about her heart because she does have coronary artery disease but says she has been walking at least 45 minutes/day on a daily basis and feels just fine when she does that.  She is maintained on Nexium 40 mg p.o. every morning, in the past we had added famotidine in the evening which she says she has not been taking on any sort of regular basis. She has not been on any new medications or recent antibiotics. She also mentions a sensation of some fullness in the throat and upper esophageal area.  She is not having any dysphagia to solids or liquids. She has not noticed any change in bowel movements.  She does mention an episode of having a scraping" discomfort within the past couple of weeks and then has noticed a small amount of blood on the tissue.  She is not having any ongoing anorectal discomfort or discomfort with defecation.  She feels this is likely secondary to hemorrhoids. Last colonoscopy was done in 2017 per Dr. Henrene Pastor with finding of multiple diverticuli, no polyps EGD in 2017 was normal. Patient is on aspirin and Plavix secondary to coronary artery disease.  Review of Systems Pertinent positive and negative review of systems were noted in the above HPI section.  All other review of systems was otherwise negative.  Outpatient Encounter Medications as  of 05/05/2020  Medication Sig  . aspirin 81 MG tablet Take 81 mg by mouth daily.  Marland Kitchen atorvastatin (LIPITOR) 10 MG tablet Take 10 mg by mouth daily.  . Calcium Citrate (CITRACAL PO) Take 2 tablets by mouth daily.   . Cholecalciferol (VITAMIN D) 2000 UNITS CAPS Take 2,000 Units by mouth daily.  . clopidogrel (PLAVIX) 75 MG tablet Take 1 tablet (75 mg total) by mouth daily.  . Evolocumab (REPATHA SURECLICK) [id number] MG/ML SOAJ Inject 1 Dose into the skin every 14 (fourteen) days.  Marland Kitchen ezetimibe (ZETIA) 10 MG tablet TAKE 1 TABLET EVERY DAY  . hydrocortisone (ANUSOL-HC) 25 MG suppository Place 1 suppository (25 mg total) rectally at bedtime for 7 days. For 7 days as needed for hemorrhoidal symptoms  . metoprolol tartrate (LOPRESSOR) 25 MG tablet Take 0.5 tablets (12.5 mg total) by mouth 2 (two) times daily.  . metroNIDAZOLE (METROGEL) 0.75 % gel Apply 1 application topically at bedtime.  . Multiple Vitamin (MULITIVITAMIN WITH MINERALS) TABS Take 1 tablet by mouth daily.  . nitroGLYCERIN (NITROSTAT) 0.4 MG SL tablet Place 1 tablet (0.4 mg total) under the tongue every 5 (five) minutes x 3 doses as needed for chest pain.  . pimecrolimus (ELIDEL) 1 % cream Apply 1 application topically every morning.  . sucralfate (CARAFATE) 1 GM/10ML suspension Take 10 mLs (1 g total) by mouth 4 (four) times daily -  with meals and at bedtime for 14 days.  . valsartan-hydrochlorothiazide (DIOVAN-HCT) 160-12.5 MG  tablet Take 1 tablet by mouth daily.  . [DISCONTINUED] esomeprazole (NEXIUM) 40 MG capsule Take 1 capsule (40 mg total) by mouth daily.  . [DISCONTINUED] famotidine (PEPCID) 40 MG tablet Take 40 mg by mouth as needed for heartburn or indigestion.  Marland Kitchen esomeprazole (NEXIUM) 40 MG capsule Take 1 capsule (40 mg total) by mouth daily.  . famotidine (PEPCID) 40 MG tablet Take 1 tablet (40 mg total) by mouth as needed for heartburn or indigestion.  . [DISCONTINUED] famotidine (PEPCID) 40 MG tablet TAKE 1 TABLET BY MOUTH EVERYDAY  AT BEDTIME (Patient not taking: Reported on 04/11/2020)   No facility-administered encounter medications on file as of 05/05/2020.   Allergies  Allergen Reactions  . Penicillins Hives, Itching and Rash    Did it involve swelling of the face/tongue/throat, SOB, or low BP? Unknown Did it involve sudden or severe rash/hives, skin peeling, or any reaction on the inside of your mouth or nose? Unknown Did you need to seek medical attention at a hospital or doctor's office? Unknown When did it last happen?pt cant recall If all above answers are "NO", may proceed with cephalosporin use.   . Rosuvastatin Other (See Comments)    REACTION: muscle weakness  . Simvastatin Other (See Comments)    REACTION: muscle weakness  . Statins Other (See Comments)    Myalgias and weakness  . Sulfonamide Derivatives Other (See Comments)    Nausea, vomiting, abd pain  . Brilinta [Ticagrelor] Rash    Itchy rash to torso  . Doxycycline Hyclate Itching and Rash  . Sulfa Antibiotics Other (See Comments)    Nausea, vomiting, abd pain   Patient Active Problem List   Diagnosis Date Noted  . Allergic rhinitis 08/18/2019  . Hyperglycemia 10/15/2018  . NSTEMI (non-ST elevated myocardial infarction) ([place]) 10/03/2018  . HLD (hyperlipidemia) 12/12/2017  . Microhematuria 06/05/2016  . Anterior neck pain 03/09/2016  . Vertigo 03/09/2016  . PAC (premature atrial contraction) 04/29/2015  . History of colonic polyps 04/29/2015  . Cervical radiculitis 12/21/2014  . Pain in the chest 04/15/2014  . Osteoporosis 02/27/2011  . Lumbar disc disease 02/27/2011  . Back pain 02/27/2011  . Encounter for well adult exam with abnormal findings 02/24/2011  . Presbyacusis 01/04/2010  . LIVER FUNCTION TESTS, ABNORMAL, HX OF 10/29/2007  . Anxiety state 10/28/2007  . Essential hypertension 10/28/2007  . GERD 10/28/2007  . DIVERTICULOSIS, COLON 10/28/2007   Social History   Socioeconomic History  . Marital status: Divorced     Spouse name: Not on file  . Number of children: 0  . Years of education: 62  . Highest education level: High school graduate  Occupational History  . Not on file  Tobacco Use  . Smoking status: Former Smoker    Types: Cigarettes  . Smokeless tobacco: Never Used  . Tobacco comment: quit in 59yrs ago  Vaping Use  . Vaping Use: Never used  Substance and Sexual Activity  . Alcohol use: Not Currently    Alcohol/week: 0.0 standard drinks    Comment: rare  . Drug use: No  . Sexual activity: Yes    Birth control/protection: Post-menopausal  Other Topics Concern  . Not on file  Social History Narrative  . Not on file   Social Determinants of Health   Financial Resource Strain: Not on file  Food Insecurity: Not on file  Transportation Needs: Not on file  Physical Activity: Not on file  Stress: Not on file  Social Connections: Not on file  Intimate  Partner Violence: Not on file    Heather Merritt's family history includes Atrial fibrillation in her father and mother; Diabetes in her maternal grandmother; Heart disease in her mother; Hyperlipidemia in her mother; Hypothyroidism in her maternal grandmother and sister; Stroke in her mother.      Objective:    Vitals:   05/05/20 0935  BP: 132/80  Pulse: 69  SpO2: 98%    Physical Exam Well-developed well-nourished  WF in no acute distress.  Height, Weight, BMI 30.6.  Ambulating slowly due to back pain  HEENT; nontraumatic normocephalic, EOMI, PE R LA, sclera anicteric. Oropharynx; not examined  Neck; supple, no JVD, no thyromegaly Cardiovascular; regular rate and rhythm with S1-S2, no murmur rub or gallop Pulmonary; Clear bilaterally Abdomen; soft, nontender, nondistended, no palpable mass or hepatosplenomegaly, bowel sounds are active Rectal; not done today Skin; benign exam, no jaundice rash or appreciable lesions Extremities; no clubbing cyanosis or edema skin warm and dry Neuro/Psych; alert and oriented x4, grossly  nonfocal mood and affect appropriate       Assessment & Plan:   #56 65 year old white female with history of chronic GERD with complaints of intermittent odynophagia onset about 1 month ago and actually improved over the past week.  She had had a sense of midsternal discomfort radiating into the back on several occasions while eating, no overt symptoms of food lodging or regurgitation. Rule out symptoms secondary to esophageal spasm, rule out esophagitis, doubt stricture with negative EGD 2017 but cannot rule out  #2  Small-volume hematochezia and recent episode of anal irritation now resolved-symptoms consistent with internal hemorrhoids  #3 colon cancer surveillance-up-to-date with negative colonoscopy 2017 #4 diverticulosis 5.  Coronary artery disease status post prior MI 6.  Chronic antiplatelet therapy-on Plavix and aspirin No. 7 anxiety 8.  Hypertension  Plan; Continue Nexium 40 mg p.o. every morning Add Pepcid 40 mg p.o. every afternoon to be taken regularly We will also give a 2-week course of Carafate liquid 1 g between meals and at bedtime Patient will be scheduled for barium swallow with tablet If barium swallow shows evidence of stricture she can be scheduled for EGD with Dr. Henrene Pastor which would require interruption of Plavix. Start hydrocortisone suppositories 1 per rectum nightly for 5 to 7 days as needed hemorrhoidal bleeding or irritation.  She is asked to let us know if she develops any increase in hemorrhoidal bleeding, or increase in frequency, and  could  Consider banding  Alfredia Ferguson PA-C 05/05/2020   Cc: Biagio Borg, MD

## 2020-05-05 NOTE — Progress Notes (Signed)
Assessment and plans as noted

## 2020-05-09 ENCOUNTER — Telehealth: Payer: Self-pay | Admitting: Physician Assistant

## 2020-05-09 MED ORDER — FAMOTIDINE 40 MG PO TABS: 40.0000 mg | ORAL_TABLET | ORAL | 4 refills | Status: DC | PRN

## 2020-05-09 NOTE — Telephone Encounter (Signed)
I think she will be fine if she just adds back pepcid every evening - then will see what Ba swallow shows

## 2020-05-09 NOTE — Telephone Encounter (Signed)
What can the Carafate be changed to?

## 2020-05-09 NOTE — Telephone Encounter (Signed)
Pt called requesting prescription for Famotidine sent to [facility] for 90 day supply. She did not pick up the one sent to [facility]. She also stated that she did not pick up prescription for Sulcrafate because it contains sulfa, to which she is allergic to. Pt would like something without sulfa.

## 2020-05-10 NOTE — Telephone Encounter (Signed)
Patient has been advised that she can take the Famotidine twice a day for now since her imaging study is on 05/13/2020 and at that point we will decide how to preceded. The patient expressed understanding.

## 2020-05-11 ENCOUNTER — Other Ambulatory Visit: Payer: Self-pay

## 2020-05-11 MED ORDER — FAMOTIDINE 40 MG PO TABS: 40.0000 mg | ORAL_TABLET | ORAL | 4 refills | Status: DC | PRN

## 2020-05-12 ENCOUNTER — Ambulatory Visit (HOSPITAL_COMMUNITY): Payer: Medicare Other

## 2020-05-12 ENCOUNTER — Telehealth: Payer: Self-pay | Admitting: Internal Medicine

## 2020-05-12 DIAGNOSIS — E785 Hyperlipidemia, unspecified: Secondary | ICD-10-CM

## 2020-05-12 MED ORDER — FAMOTIDINE 40 MG PO TABS
40.0000 mg | ORAL_TABLET | Freq: Every day | ORAL | 4 refills | Status: DC
Start: 2020-05-12 — End: 2021-08-09

## 2020-05-12 NOTE — Addendum Note (Signed)
Addended by: Aleatha Borer on: 05/12/2020 09:34 AM   Modules accepted: Orders

## 2020-05-12 NOTE — Telephone Encounter (Signed)
Patient's Famotidine has been sent to [facility] for the third time

## 2020-05-12 NOTE — Telephone Encounter (Signed)
Thanks .. obviously she should stay off [place]. We'll discuss some other options with her in the office.  Dr Lemmie Evens

## 2020-05-12 NOTE — Telephone Encounter (Signed)
Pt c/o medication issue:  1. Name of Medication: Repatha   2. How are you currently taking this medication (dosage and times per day)? As written  3. Are you having a reaction (difficulty breathing--STAT)? No   4. What is your medication issue? Itchy eyes, inside of month swelling, face swelling when taking Repatha.

## 2020-05-12 NOTE — Telephone Encounter (Signed)
Spoke with patient and scheduled f/u on 2/28  She is no longer taking repatha - on allergy list She is still on zetia, lipitor She plans to get labs checked before visit

## 2020-05-12 NOTE — Telephone Encounter (Signed)
Patient reports allergic reaction to Repatha. Have similar reaction with 1st dose on January/23, more severe symptoms after 2nd dose onFeb/7. She is taking benadryl, and reports feeling better at this time.   Need to discuss alternative therapy with Dr Debara Pickett.  Please contact patient for follow up

## 2020-05-12 NOTE — Telephone Encounter (Signed)
Inbound call from patient stating pharmacy needs updated info for famotidine script please; they're stating they have not received it.

## 2020-05-13 ENCOUNTER — Ambulatory Visit (HOSPITAL_COMMUNITY)
Admission: RE | Admit: 2020-05-13 | Discharge: 2020-05-13 | Disposition: A | Discharge status: Home / Self Care | Payer: Medicare Other | Source: Ambulatory Visit | Attending: Physician Assistant | Admitting: Physician Assistant

## 2020-05-13 ENCOUNTER — Other Ambulatory Visit: Payer: Self-pay

## 2020-05-13 DIAGNOSIS — K648 Other hemorrhoids: Secondary | ICD-10-CM | POA: Insufficient documentation

## 2020-05-13 DIAGNOSIS — K219 Gastro-esophageal reflux disease without esophagitis: Secondary | ICD-10-CM | POA: Diagnosis present

## 2020-05-13 DIAGNOSIS — R1310 Dysphagia, unspecified: Secondary | ICD-10-CM | POA: Diagnosis present

## 2020-05-24 LAB — LIPID PANEL
Chol/HDL Ratio: 1.8 ratio (ref 0.0–4.4)
Cholesterol, Total: 85 mg/dL — ABNORMAL LOW (ref 100–199)
HDL: 48 mg/dL (ref >39)
LDL Chol Calc (NIH): 17 mg/dL (ref 0–99)
Triglycerides: 111 mg/dL (ref 0–149)
VLDL Cholesterol Cal: 20 mg/dL (ref 5–40)

## 2020-05-30 ENCOUNTER — Ambulatory Visit (INDEPENDENT_AMBULATORY_CARE_PROVIDER_SITE_OTHER): Payer: Medicare Other | Admitting: Internal Medicine

## 2020-05-30 ENCOUNTER — Encounter: Payer: Self-pay | Admitting: Internal Medicine

## 2020-05-30 ENCOUNTER — Other Ambulatory Visit: Payer: Self-pay

## 2020-05-30 VITALS — BP 125/81 | HR 67 | Ht 63.0 in | Wt 172.2 lb

## 2020-05-30 DIAGNOSIS — M791 Myalgia, unspecified site: Secondary | ICD-10-CM

## 2020-05-30 DIAGNOSIS — T466X5A Adverse effect of antihyperlipidemic and antiarteriosclerotic drugs, initial encounter: Secondary | ICD-10-CM

## 2020-05-30 DIAGNOSIS — E785 Hyperlipidemia, unspecified: Secondary | ICD-10-CM

## 2020-05-30 DIAGNOSIS — I2510 Atherosclerotic heart disease of native coronary artery without angina pectoris: Secondary | ICD-10-CM | POA: Diagnosis not present

## 2020-05-30 NOTE — Progress Notes (Signed)
LIPID CLINIC CONSULT NOTE  Chief Complaint:  Follow-up dyslipidemia  Primary Care Physician: Biagio Borg, MD  Primary Cardiologist:  Quay Burow, MD  HPI:  Heather Merritt is a 65 y.o. female who is being seen today for the evaluation of dyslipidemia at the request of Biagio Borg, MD. This is a 65 year old female patient of Dr. Alvester Chou who was referred by Almyra Deforest, PA-C, for management of dyslipidemia.  Unfortunately she recently had chest pain was found to have a non-STEMI.  Cath in July 2020 demonstrated some coronary disease specifically in the right coronary artery requiring a drug-eluting stent placement.  Since then she has done very well without recurrent chest pain.  She started cardiac rehabilitation.  She does have a longstanding history of dyslipidemia including family history of coronary disease.  She had previously been on a number of different statin medications including atorvastatin, rosuvastatin, simvastatin, pravastatin and others with significant myalgias, particularly at the higher doses.  She had been taking atorvastatin 10 mg every other day prior to this event and was convinced to take it on a daily basis afterwards.  So far that seems to be reasonably well tolerated with some mild myalgias.  She has not had reassessment of her cholesterol since July however her total cholesterol the time was 204, triglycerides 280, HDL 36 and LDL of 112.  05/08/2019  Heather Merritt returns today for follow-up.  Overall she is doing fairly well.  Her cholesterol has improved on the Zetia.  LDL is down to 81 from 113.  Total cholesterol is 158 with triglycerides increased to 167.  This is been high in the past.  She thinks is mostly dietary related.  She is on over-the-counter fish oil which I have advised against due to lack of trial data on this.  We discussed further at length today about Vascepa and how this could be a good addition to her regimen because of the significant  cardiovascular risk reduction that was seen in the reduce it trial.  09/10/2019  Heather Merritt is seen today in follow-up.  Unfortunately she could not afford Vascepa but did make some dietary changes.  She started taking over-the-counter fish oil.  She noted that her LDL cholesterol is actually gone now up and her triglycerides though have come down.  Most recent lipid showed total cholesterol 171, triglycerides 154 and LDL of 99.  I advised that there was no data for cardiovascular risk reduction with over-the-counter fish oils and I would recommend not taking it.  Since she cannot afford the Vascepa, we will have to focus on better LDL control.  She is only on low-dose atorvastatin due to side effects.  05/30/2020  Heather Merritt is seen today in follow-up.  Unfortunately she was intolerant of Repatha as well.  She did take 2 doses but had what is described as a immune or allergic response.  She had some throat tightening and facial flushing.  This improved with antihistamine.  She did have significant lipid lowering however on the medication with total cholesterol now 85, HDL 48, triglycerides 111 and LDL 17.  Unfortunately, due to her allergic response, this will not be an option for her going forward.  PMHx:  Past Medical History:  Diagnosis Date  . Allergy    SEASONAL  . ANXIETY 10/28/2007  . Arthritis   . CAD (coronary artery disease), native coronary artery    a. Cath 10/06/2018 - S/p DES to [facility]; medical therapy for high grade  ostial/pro 2nd digonal   . Cataracts, bilateral    immature  . Chronic back pain   . Coronary artery disease   . DIVERTICULOSIS, COLON 10/28/2007  . Dry skin    red patch on right knee area  . GERD 10/28/2007   takes Prevacid daily  . H/O hiatal hernia   . HLD (hyperlipidemia) 12/12/2017  . Hx of colonic polyps   . HYPERLIPIDEMIA 10/28/2007   unable to take meds d/t allergies  . HYPERTENSION 10/28/2007   takes Diovan daily  . Internal hemorrhoids   . Irritable  bowel syndrome (IBS)   . Joint pain   . Joint swelling   . LIVER FUNCTION TESTS, ABNORMAL, HX OF 10/29/2007  . Lumbar disc disease   . Migraine   . Myocardial infarction ([place])   . Obesity   . OSTEOPENIA 10/29/2007  . Osteoporosis   . Presbyacusis 01/04/2010  . Rosacea   . Seasonal allergies    takes Clarinex daily  . Vertigo    hx of  . Vitamin D deficiency    takes Vit D daily    Past Surgical History:  Procedure Laterality Date  . ANTERIOR CERVICAL DECOMP/DISCECTOMY FUSION  08/02/2011   Procedure: ANTERIOR CERVICAL DECOMPRESSION/DISCECTOMY FUSION 3 LEVELS;  Surgeon: Elaina Hoops, MD;  Location: [place] [facility]UR[facility] OR[facility];  Service: Neurosurgery;  Laterality: N/A;  Cervical Three-Four, Cervical Four-Five, Cervical Five-Six  Anterior Cervical Decompression Fusion   . CARDIAC CATHETERIZATION    . COLONOSCOPY  2001/2013  . CORONARY STENT INTERVENTION N/A 10/06/2018   Procedure: CORONARY STENT INTERVENTION;  Surgeon: Lorretta Harp, MD;  Location: [place] [facility];  Service: Cardiovascular;  Laterality: N/A;  . ESOPHAGOGASTRODUODENOSCOPY    . LEFT HEART CATH AND CORONARY ANGIOGRAPHY N/A 10/06/2018   Procedure: LEFT HEART CATH AND CORONARY ANGIOGRAPHY;  Surgeon: Lorretta Harp, MD;  Location: [place] CV LAB;  Service: Cardiovascular;  Laterality: N/A;  . POLYPECTOMY    . TONSILLECTOMY    . TONSILLECTOMY  at age 19    FAMHx:  Family History  Problem Relation Age of Onset  . Atrial fibrillation Mother   . Hyperlipidemia Mother   . Stroke Mother   . Heart disease Mother   . Atrial fibrillation Father   . Hypothyroidism Sister   . Hypothyroidism Maternal Grandmother   . Diabetes Maternal Grandmother   . Anesthesia problems Neg Hx   . Hypotension Neg Hx   . Malignant hyperthermia Neg Hx   . Pseudochol deficiency Neg Hx   . Colon cancer Neg Hx   . Stomach cancer Neg Hx   . Rectal cancer Neg Hx   . Esophageal cancer Neg Hx     SOCHx:   reports that she has quit smoking. Her smoking  use included cigarettes. She has never used smokeless tobacco. She reports previous alcohol use. She reports that she does not use drugs.  ALLERGIES:  Allergies  Allergen Reactions  . Penicillins Hives, Itching and Rash    Did it involve swelling of the face/tongue/throat, SOB, or low BP? Unknown Did it involve sudden or severe rash/hives, skin peeling, or any reaction on the inside of your mouth or nose? Unknown Did you need to seek medical attention at a hospital or doctor's office? Unknown When did it last happen?pt cant recall If all above answers are "NO", may proceed with cephalosporin use.   . Rosuvastatin Other (See Comments)    REACTION: muscle weakness  . Simvastatin Other (See Comments)  REACTION: muscle weakness  . Statins Other (See Comments)    Myalgias and weakness  . Sulfonamide Derivatives Other (See Comments)    Nausea, vomiting, abd pain  . Repatha [Evolocumab] Swelling    Swelling, itchy eyes, mouth swelling, red/puffy/hot face  . Brilinta [Ticagrelor] Rash    Itchy rash to torso  . Doxycycline Hyclate Itching and Rash  . Sulfa Antibiotics Other (See Comments)    Nausea, vomiting, abd pain    ROS: Pertinent items noted in HPI and remainder of comprehensive ROS otherwise negative.  HOME MEDS: Current Outpatient Medications on File Prior to Visit  Medication Sig Dispense Refill  . aspirin 81 MG tablet Take 81 mg by mouth daily.    Marland Kitchen atorvastatin (LIPITOR) 10 MG tablet Take 10 mg by mouth daily.    . Calcium Citrate (CITRACAL PO) Take 2 tablets by mouth daily.     . Cholecalciferol (VITAMIN D) 2000 UNITS CAPS Take 2,000 Units by mouth daily.    . clopidogrel (PLAVIX) 75 MG tablet Take 1 tablet (75 mg total) by mouth daily. 90 tablet 3  . esomeprazole (NEXIUM) 40 MG capsule Take 1 capsule (40 mg total) by mouth daily. 90 capsule 4  . ezetimibe (ZETIA) 10 MG tablet TAKE 1 TABLET EVERY DAY 90 tablet 3  . famotidine (PEPCID) 40 MG tablet Take 1 tablet  (40 mg total) by mouth daily. 90 tablet 4  . metoprolol tartrate (LOPRESSOR) 25 MG tablet Take 0.5 tablets (12.5 mg total) by mouth 2 (two) times daily. 90 tablet 3  . metroNIDAZOLE (METROGEL) 0.75 % gel Apply 1 application topically at bedtime. 45 g 0  . Multiple Vitamin (MULITIVITAMIN WITH MINERALS) TABS Take 1 tablet by mouth daily.    . nitroGLYCERIN (NITROSTAT) 0.4 MG SL tablet Place 1 tablet (0.4 mg total) under the tongue every 5 (five) minutes x 3 doses as needed for chest pain. 25 tablet 12  . pimecrolimus (ELIDEL) 1 % cream Apply 1 application topically every morning. 30 g 0  . valsartan-hydrochlorothiazide (DIOVAN-HCT) 160-12.5 MG tablet Take 1 tablet by mouth daily. 90 tablet 3   No current facility-administered medications on file prior to visit.    LABS/IMAGING: No results found for this or any previous visit (from the past 48 hour(s)). No results found.  LIPID PANEL:    Component Value Date/Time   CHOL 85 (L) 05/24/2020 0925   TRIG 111 05/24/2020 0925   HDL 48 05/24/2020 0925   CHOLHDL 1.8 05/24/2020 0925   CHOLHDL 5.7 10/04/2018 0312   VLDL 56 (H) 10/04/2018 0312   LDLCALC 17 05/24/2020 0925   LDLDIRECT 177.0 06/05/2017 1301    WEIGHTS: Wt Readings from Last 3 Encounters:  05/30/20 172 lb 3.2 oz (78.1 kg)  05/05/20 175 lb 9.6 oz (79.7 kg)  04/11/20 175 lb (79.4 kg)    VITALS: BP 125/81   Pulse 67   Ht 5\' 3"  (1.6 m)   Wt 172 lb 3.2 oz (78.1 kg)   SpO2 98%   BMI 30.50 kg/m   EXAM: Deferred  EKG: Deferred  ASSESSMENT: 1. Mixed dyslipidemia, goal LDL <70 2. Relative statin intolerance 3. Allergic reaction to Repatha 4. CAD with recent NSTEMI and PCI to the RCA (10/2018)  PLAN: 1.   Heather Merritt seems to have had an allergic reaction to Repatha.  This would preclude her from being on antibody PCSK9 inhibitors in my opinion.  She was able to achieve a significant lipid lowering on this medication however will need  to discontinue it.  She will remain  on 10 mg atorvastatin and ezetimibe.  She might be a candidate to add Nexletol.  We will plan repeat lipids in 3 to 4 months and follow-up at that time to discuss options.  Pixie Casino, MD, [facility], [place] Director of the Advanced Lipid Disorders &  Cardiovascular Risk Reduction Clinic Diplomate of the American Board of Clinical Lipidology Attending Cardiologist  Direct Dial: [phone]  Fax: [phone]  Website:  [email or web address] 05/30/2020, 11:10 AM

## 2020-05-30 NOTE — Patient Instructions (Signed)
Medication Instructions:  Your physician recommends that you continue on your current medications as directed. Please refer to the Current Medication list given to you today.  *If you need a refill on your cardiac medications before your next appointment, please call your pharmacy*   Lab Work: FASTING lab work in 4 months  If you have labs (blood work) drawn today and your tests are completely normal, you will receive your results only by: Marland Kitchen MyChart Message (if you have MyChart) OR . A paper copy in the mail If you have any lab test that is abnormal or we need to change your treatment, we will call you to review the results.   Testing/Procedures: NONE   Follow-Up: At [facility], you and your health needs are our priority.  As part of our continuing mission to provide you with exceptional heart care, we have created designated Provider Care Teams.  These Care Teams include your primary Cardiologist (physician) and Advanced Practice Providers (APPs -  Physician Assistants and Nurse Practitioners) who all work together to provide you with the care you need, when you need it.  We recommend signing up for the patient portal called "MyChart".  Sign up information is provided on this After Visit Summary.  MyChart is used to connect with patients for Virtual Visits (Telemedicine).  Patients are able to view lab/test results, encounter notes, upcoming appointments, etc.  Non-urgent messages can be sent to your provider as well.   To learn more about what you can do with MyChart, go to [email or web address].    Your next appointment:   4 month(s) - lipid clinic  The format for your next appointment:   In Person  Provider:   K. Mali Hilty, MD   Other Instructions

## 2020-06-14 ENCOUNTER — Telehealth: Payer: Self-pay | Admitting: Cardiovascular Disease

## 2020-06-14 NOTE — Telephone Encounter (Signed)
RN spoke Dr Gwenlyn Found - information given in regards to the patient call.  Per Dr Gwenlyn Found okay for patient to take 12.5 mg Metoprolol Tartrate now.  take  25 mg dose night about 10 pm.  Per Dr Gwenlyn Found if heart is still elevated in the morning please call the office to be seen.   Patient made aware. Aware if symptoms become worse  Call 911 go to ER. Patient Verbalized understanding.

## 2020-06-14 NOTE — Telephone Encounter (Signed)
Pt c/o BP issue: STAT if pt c/o blurred vision, one-sided weakness or slurred speech  1. What are your last 5 BP readings? 113/59 HR 125, 110/69 HR 130,, 113/72 HR 123,  110/77 HR 115, has come down 100/63 HR 115   2. Are you having any other symptoms (ex. Dizziness, headache, blurred vision, passed out)? No  3. What is your BP issue? Patient states she had cataract surgery today and started throwing up after. She states her BP has been low and HR high. She states she is sitting down and has been drinking water. She states her heart does not feel like it is jumping out of her chest anymore. She states she is concerned because she was told her pulse should not go over 125 due to her stent.

## 2020-06-14 NOTE — Telephone Encounter (Signed)
Spoke to patient . She states she had cataract surgery this morning . Left outpatient Facility  @9 :30 am. Patient states  heart rate became elevated about 2 hours   Range between  115 to 130 beats per minute. Blood pressure  110/69 - 113/59 per patient . Patient states she has no symptoms other than heart rate increasing she said does feel like its pounding, no dizziness or shortness of breath.  patient states she did call anesthesia -  Informed her drink plenty of water she may dehyrdated and contact cardiology. She states  She did take her Metoprolol 12.5 mg this morning as prescribed.  Rn informed patient to a take a 12.5 mg dose now.  Will discuss with Dr Allyson Sabal and contact patient .

## 2020-06-15 ENCOUNTER — Other Ambulatory Visit: Payer: Self-pay | Admitting: Internal Medicine

## 2020-07-26 DIAGNOSIS — E669 Obesity, unspecified: Secondary | ICD-10-CM | POA: Insufficient documentation

## 2020-07-26 DIAGNOSIS — M8580 Other specified disorders of bone density and structure, unspecified site: Secondary | ICD-10-CM | POA: Insufficient documentation

## 2020-07-26 DIAGNOSIS — N840 Polyp of corpus uteri: Secondary | ICD-10-CM | POA: Insufficient documentation

## 2020-07-26 DIAGNOSIS — N839 Noninflammatory disorder of ovary, fallopian tube and broad ligament, unspecified: Secondary | ICD-10-CM | POA: Insufficient documentation

## 2020-08-15 ENCOUNTER — Ambulatory Visit: Payer: Medicare Other | Admitting: Internal Medicine

## 2020-08-17 ENCOUNTER — Ambulatory Visit: Payer: Medicare Other | Admitting: Internal Medicine

## 2020-09-11 ENCOUNTER — Other Ambulatory Visit: Payer: Self-pay | Admitting: Internal Medicine

## 2020-09-11 NOTE — Telephone Encounter (Signed)
Please refill as per office routine med refill policy (all routine meds refilled for 3 mo or monthly per pt preference up to one year from last visit, then month to month grace period for 3 mo, then further med refills will have to be denied)  

## 2020-09-12 ENCOUNTER — Encounter: Payer: Self-pay | Admitting: Internal Medicine

## 2020-09-12 ENCOUNTER — Other Ambulatory Visit: Payer: Self-pay

## 2020-09-12 ENCOUNTER — Ambulatory Visit (INDEPENDENT_AMBULATORY_CARE_PROVIDER_SITE_OTHER): Payer: Medicare Other | Admitting: Internal Medicine

## 2020-09-12 VITALS — BP 120/74 | HR 69 | Temp 98.2°F | Ht 63.0 in | Wt 174.0 lb

## 2020-09-12 DIAGNOSIS — R739 Hyperglycemia, unspecified: Secondary | ICD-10-CM

## 2020-09-12 DIAGNOSIS — I10 Essential (primary) hypertension: Secondary | ICD-10-CM | POA: Diagnosis not present

## 2020-09-12 DIAGNOSIS — I2510 Atherosclerotic heart disease of native coronary artery without angina pectoris: Secondary | ICD-10-CM | POA: Diagnosis not present

## 2020-09-12 DIAGNOSIS — E538 Deficiency of other specified B group vitamins: Secondary | ICD-10-CM | POA: Diagnosis not present

## 2020-09-12 DIAGNOSIS — E559 Vitamin D deficiency, unspecified: Secondary | ICD-10-CM

## 2020-09-12 DIAGNOSIS — E782 Mixed hyperlipidemia: Secondary | ICD-10-CM

## 2020-09-12 LAB — VITAMIN D 25 HYDROXY (VIT D DEFICIENCY, FRACTURES): VITD: 56.04 ng/mL (ref 30.00–100.00)

## 2020-09-12 LAB — CBC WITH DIFFERENTIAL/PLATELET
Basophils Absolute: 0 10*3/uL (ref 0.0–0.1)
Basophils Relative: 0.6 % (ref 0.0–3.0)
Eosinophils Absolute: 0 10*3/uL (ref 0.0–0.7)
Eosinophils Relative: 1.1 % (ref 0.0–5.0)
HCT: 41.2 % (ref 36.0–46.0)
Hemoglobin: 13.7 g/dL (ref 12.0–15.0)
Lymphocytes Relative: 37 % (ref 12.0–46.0)
Lymphs Abs: 1.5 10*3/uL (ref 0.7–4.0)
MCHC: 33.3 g/dL (ref 30.0–36.0)
MCV: 90.9 fl (ref 78.0–100.0)
Monocytes Absolute: 0.4 10*3/uL (ref 0.1–1.0)
Monocytes Relative: 9.3 % (ref 3.0–12.0)
Neutro Abs: 2.1 10*3/uL (ref 1.4–7.7)
Neutrophils Relative %: 52 % (ref 43.0–77.0)
Platelets: 278 10*3/uL (ref 150.0–400.0)
RBC: 4.53 Mil/uL (ref 3.87–5.11)
RDW: 12.8 % (ref 11.5–15.5)
WBC: 3.9 10*3/uL — ABNORMAL LOW (ref 4.0–10.5)

## 2020-09-12 LAB — URINALYSIS, ROUTINE W REFLEX MICROSCOPIC
Bilirubin Urine: NEGATIVE
Hgb urine dipstick: NEGATIVE
Ketones, ur: NEGATIVE
Leukocytes,Ua: NEGATIVE
Nitrite: NEGATIVE
RBC / HPF: NONE SEEN (ref 0–?)
Specific Gravity, Urine: 1.015 (ref 1.000–1.030)
Total Protein, Urine: NEGATIVE
Urine Glucose: NEGATIVE
Urobilinogen, UA: 0.2 (ref 0.0–1.0)
WBC, UA: NONE SEEN (ref 0–?)
pH: 6.5 (ref 5.0–8.0)

## 2020-09-12 LAB — BASIC METABOLIC PANEL
BUN: 20 mg/dL (ref 6–23)
CO2: 32 mEq/L (ref 19–32)
Calcium: 9.8 mg/dL (ref 8.4–10.5)
Chloride: 100 mEq/L (ref 96–112)
Creatinine, Ser: 0.68 mg/dL (ref 0.40–1.20)
GFR: 90.22 mL/min (ref >60.00)
Glucose, Bld: 91 mg/dL (ref 70–99)
Potassium: 4 mEq/L (ref 3.5–5.1)
Sodium: 140 mEq/L (ref 135–145)

## 2020-09-12 LAB — VITAMIN B12: Vitamin B-12: 884 pg/mL (ref 211–911)

## 2020-09-12 LAB — HEMOGLOBIN A1C: Hgb A1c MFr Bld: 5.6 % (ref 4.6–6.5)

## 2020-09-12 LAB — HEPATIC FUNCTION PANEL
ALT: 31 U/L (ref 0–35)
AST: 24 U/L (ref 0–37)
Albumin: 4.5 g/dL (ref 3.5–5.2)
Alkaline Phosphatase: 57 U/L (ref 39–117)
Bilirubin, Direct: 0.1 mg/dL (ref 0.0–0.3)
Total Bilirubin: 0.7 mg/dL (ref 0.2–1.2)
Total Protein: 7.2 g/dL (ref 6.0–8.3)

## 2020-09-12 LAB — LIPID PANEL
Cholesterol: 164 mg/dL (ref 0–200)
HDL: 40.6 mg/dL (ref >39.00)
LDL Cholesterol: 93 mg/dL (ref 0–99)
NonHDL: 123.41
Total CHOL/HDL Ratio: 4
Triglycerides: 152 mg/dL — ABNORMAL HIGH (ref 0.0–149.0)
VLDL: 30.4 mg/dL (ref 0.0–40.0)

## 2020-09-12 LAB — TSH: TSH: 1.46 u[IU]/mL (ref 0.35–4.50)

## 2020-09-12 NOTE — Patient Instructions (Addendum)

## 2020-09-12 NOTE — Progress Notes (Signed)
Patient ID: Heather Merritt, female   DOB: Oct 26, 1955, 65 y.o.   MRN: [id number]         Chief Complaint:: yearly exam       HPI:  Heather Merritt is a 65 y.o. female overall doing well.  Pt denies chest pain, increased sob or doe, wheezing, orthopnea, PND, increased LE swelling, palpitations, dizziness or syncope.   Pt denies polydipsia, polyuria, or new focal neuro s/s.   Pt denies fever, wt loss, night sweats, loss of appetite, or other constitutional symptoms   Had allergy reaction to repatha, so only on lipitor and zetia for now.     Wt Readings from Last 3 Encounters:  09/12/20 174 lb (78.9 kg)  05/30/20 172 lb 3.2 oz (78.1 kg)  05/05/20 175 lb 9.6 oz (79.7 kg)   BP Readings from Last 3 Encounters:  09/12/20 120/74  05/30/20 125/81  05/05/20 132/80   Immunization History  Administered Date(s) Administered   Fluad Quad(high Dose 65+) 02/15/2020   Influenza, High Dose Seasonal PF 01/21/2019   Influenza,inj,Quad PF,6+ Mos 04/14/2013   PFIZER(Purple Top)SARS-COV-2 Vaccination 04/26/2019, 05/18/2019, 03/10/2020   Td 04/02/2004   Tdap 06/05/2016   Zoster Recombinat (Shingrix) 12/22/2017, 04/02/2018, 07/05/2018   There are no preventive care reminders to display for this patient.     Past Medical History:  Diagnosis Date   Allergy    SEASONAL   ANXIETY 10/28/2007   Arthritis    CAD (coronary artery disease), native coronary artery    a. Cath 10/06/2018 - S/p DES to [facility]; medical therapy for high grade ostial/pro 2nd digonal    Cataracts, bilateral    immature   Chronic back pain    Coronary artery disease    DIVERTICULOSIS, COLON 10/28/2007   Dry skin    red patch on right knee area   GERD 10/28/2007   takes Prevacid daily   H/O hiatal hernia    HLD (hyperlipidemia) 12/12/2017   Hx of colonic polyps    HYPERLIPIDEMIA 10/28/2007   unable to take meds d/t allergies   HYPERTENSION 10/28/2007   takes Diovan daily   Internal hemorrhoids    Irritable bowel syndrome (IBS)     Joint pain    Joint swelling    LIVER FUNCTION TESTS, ABNORMAL, HX OF 10/29/2007   Lumbar disc disease    Migraine    Myocardial infarction ([place])    Obesity    OSTEOPENIA 10/29/2007   Osteoporosis    Presbyacusis 01/04/2010   Rosacea    Seasonal allergies    takes Clarinex daily   Vertigo    hx of   Vitamin D deficiency    takes Vit D daily   Past Surgical History:  Procedure Laterality Date   ANTERIOR CERVICAL DECOMP/DISCECTOMY FUSION  08/02/2011   Procedure: ANTERIOR CERVICAL DECOMPRESSION/DISCECTOMY FUSION 3 LEVELS;  Surgeon: Elaina Hoops, MD;  Location: [place] NEUR[facility] ORS;  Service: Neurosurgery;  Laterality: N/A;  Cervical Three-Four, Cervical Four-Five, Cervical Five-Six  Anterior Cervical Decompression Fusion    CARDIAC CATHETERIZATION     COLONOSCOPY  2001/2013   CORONARY STENT INTERVENTION N/A 10/06/2018   Procedure: CORONARY STENT INTERVENTION;  Surgeon: Lorretta Harp, MD;  Location: [place] [facility];  Service: Cardiovascular;  Laterality: N/A;   ESOPHAGOGASTRODUODENOSCOPY     LEFT HEART CATH AND CORONARY ANGIOGRAPHY N/A 10/06/2018   Procedure: LEFT HEART CATH AND CORONARY ANGIOGRAPHY;  Surgeon: Lorretta Harp, MD;  Location: [place] [facility];  Service: Cardiovascular;  Laterality:  N/A;   POLYPECTOMY     TONSILLECTOMY     TONSILLECTOMY  at age 54    reports that she has quit smoking. Her smoking use included cigarettes. She has never used smokeless tobacco. She reports previous alcohol use. She reports that she does not use drugs. family history includes Atrial fibrillation in her father and mother; Diabetes in her maternal grandmother; Heart disease in her mother; Hyperlipidemia in her mother; Hypothyroidism in her maternal grandmother and sister; Stroke in her mother. Allergies  Allergen Reactions   Penicillins Hives, Itching and Rash    Did it involve swelling of the face/tongue/throat, SOB, or low BP? Unknown Did it involve sudden or severe rash/hives, skin peeling, or  any reaction on the inside of your mouth or nose? Unknown Did you need to seek medical attention at a hospital or doctor's office? Unknown When did it last happen?      pt cant recall If all above answers are "NO", may proceed with cephalosporin use.    Rosuvastatin Other (See Comments)    REACTION: muscle weakness   Simvastatin Other (See Comments)    REACTION: muscle weakness   Statins Other (See Comments)    Myalgias and weakness   Sulfonamide Derivatives Other (See Comments)    Nausea, vomiting, abd pain   Repatha [Evolocumab] Swelling    Swelling, itchy eyes, mouth swelling, red/puffy/hot face   Brilinta [Ticagrelor] Rash    Itchy rash to torso   Doxycycline Hyclate Itching and Rash   Sulfa Antibiotics Other (See Comments)    Nausea, vomiting, abd pain   Current Outpatient Medications on File Prior to Visit  Medication Sig Dispense Refill   aspirin 81 MG tablet Take 81 mg by mouth daily.     atorvastatin (LIPITOR) 10 MG tablet Take 10 mg by mouth daily.     Calcium Citrate (CITRACAL PO) Take 2 tablets by mouth daily.      Cholecalciferol (VITAMIN D) 2000 UNITS CAPS Take 2,000 Units by mouth daily.     clopidogrel (PLAVIX) 75 MG tablet TAKE 1 TABLET EVERY DAY 90 tablet 2   ezetimibe (ZETIA) 10 MG tablet TAKE 1 TABLET EVERY DAY 90 tablet 3   famotidine (PEPCID) 40 MG tablet Take 1 tablet (40 mg total) by mouth daily. 90 tablet 4   metoprolol tartrate (LOPRESSOR) 25 MG tablet TAKE 1/2 TABLET TWICE DAILY 90 tablet 2   metroNIDAZOLE (METROGEL) 0.75 % gel Apply 1 application topically at bedtime. 45 g 0   Multiple Vitamin (MULITIVITAMIN WITH MINERALS) TABS Take 1 tablet by mouth daily.     nitroGLYCERIN (NITROSTAT) 0.4 MG SL tablet Place 1 tablet (0.4 mg total) under the tongue every 5 (five) minutes x 3 doses as needed for chest pain. 25 tablet 12   pimecrolimus (ELIDEL) 1 % cream Apply 1 application topically every morning. 30 g 0   valsartan-hydrochlorothiazide (DIOVAN-HCT)  160-12.5 MG tablet TAKE 1 TABLET EVERY DAY 90 tablet 2   atorvastatin (LIPITOR) 20 MG tablet TAKE 1 TABLET EVERY DAY 90 tablet 3   esomeprazole (NEXIUM) 40 MG capsule TAKE 1 CAPSULE EVERY DAY 90 capsule 4   No current facility-administered medications on file prior to visit.        ROS:  All others reviewed and negative.  Objective        PE:  BP 120/74 (BP Location: Left Arm, Patient Position: Sitting, Cuff Size: Large)   Pulse 69   Temp 98.2 F (36.8 C) (Oral)   Ht  5\' 3"  (1.6 m)   Wt 174 lb (78.9 kg)   SpO2 97%   BMI 30.82 kg/m                 Constitutional: Pt appears in NAD               HENT: Head: NCAT.                Right Ear: External ear normal.                 Left Ear: External ear normal.                Eyes: . Pupils are equal, round, and reactive to light. Conjunctivae and EOM are normal               Nose: without d/c or deformity               Neck: Neck supple. Gross normal ROM               Cardiovascular: Normal rate and regular rhythm.                 Pulmonary/Chest: Effort normal and breath sounds without rales or wheezing.                Abd:  Soft, NT, ND, + BS, no organomegaly               Neurological: Pt is alert. At baseline orientation, motor grossly intact               Skin: Skin is warm. No rashes, no other new lesions, LE edema - none               Psychiatric: Pt behavior is normal without agitation   Micro: none  Cardiac tracings I have personally interpreted today:  none  Pertinent Radiological findings (summarize): none   Lab Results  Component Value Date   WBC 3.9 (L) 09/12/2020   HGB 13.7 09/12/2020   HCT 41.2 09/12/2020   PLT 278.0 09/12/2020   GLUCOSE 91 09/12/2020   CHOL 164 09/12/2020   TRIG 152.0 (H) 09/12/2020   HDL 40.60 09/12/2020   LDLDIRECT 177.0 06/05/2017   LDLCALC 93 09/12/2020   ALT 31 09/12/2020   AST 24 09/12/2020   NA 140 09/12/2020   K 4.0 09/12/2020   CL 100 09/12/2020   CREATININE 0.68 09/12/2020    BUN 20 09/12/2020   CO2 32 09/12/2020   TSH 1.46 09/12/2020   INR 0.9 10/03/2018   HGBA1C 5.6 09/12/2020   Assessment/Plan:  Heather Merritt is a 65 y.o. White or Caucasian [1] female with  has a past medical history of Allergy, ANXIETY (10/28/2007), Arthritis, CAD (coronary artery disease), native coronary artery, Cataracts, bilateral, Chronic back pain, Coronary artery disease, DIVERTICULOSIS, COLON (10/28/2007), Dry skin, GERD (10/28/2007), H/O hiatal hernia, HLD (hyperlipidemia) (12/12/2017), colonic polyps, HYPERLIPIDEMIA (10/28/2007), HYPERTENSION (10/28/2007), Internal hemorrhoids, Irritable bowel syndrome (IBS), Joint pain, Joint swelling, LIVER FUNCTION TESTS, ABNORMAL, HX OF (10/29/2007), Lumbar disc disease, Migraine, Myocardial infarction ([place]), Obesity, OSTEOPENIA (10/29/2007), Osteoporosis, Presbyacusis (01/04/2010), Rosacea, Seasonal allergies, Vertigo, and Vitamin D deficiency.  Essential (primary) hypertension BP Readings from Last 3 Encounters:  09/12/20 120/74  05/30/20 125/81  05/05/20 132/80   Stable, pt to continue medical treatment diovan hct, lopressor   HLD (hyperlipidemia) Lab Results  Component Value Date   LDLCALC 93 09/12/2020   Stable, pt to continue current statin  lipitor   Hyperglycemia Lab Results  Component Value Date   HGBA1C 5.6 09/12/2020   Stable, pt to continue current medical treatment  - diet  Followup: Return in about 6 months (around 03/14/2021).  Heather Cower, MD 09/16/2020 10:54 PM [facility]

## 2020-09-13 ENCOUNTER — Encounter: Payer: Self-pay | Admitting: Internal Medicine

## 2020-09-16 ENCOUNTER — Encounter: Payer: Self-pay | Admitting: Internal Medicine

## 2020-09-16 NOTE — Assessment & Plan Note (Signed)
BP Readings from Last 3 Encounters:  09/12/20 120/74  05/30/20 125/81  05/05/20 132/80   Stable, pt to continue medical treatment diovan hct, lopressor

## 2020-09-16 NOTE — Assessment & Plan Note (Signed)
Lab Results  Component Value Date   LDLCALC 93 09/12/2020   Stable, pt to continue current statin lipitor

## 2020-09-16 NOTE — Assessment & Plan Note (Signed)
Lab Results  Component Value Date   HGBA1C 5.6 09/12/2020   Stable, pt to continue current medical treatment  - diet

## 2020-09-24 LAB — LIPID PANEL
Chol/HDL Ratio: 4 ratio (ref 0.0–4.4)
Cholesterol, Total: 172 mg/dL (ref 100–199)
HDL: 43 mg/dL (ref >39)
LDL Chol Calc (NIH): 100 mg/dL — ABNORMAL HIGH (ref 0–99)
Triglycerides: 165 mg/dL — ABNORMAL HIGH (ref 0–149)
VLDL Cholesterol Cal: 29 mg/dL (ref 5–40)

## 2020-10-06 ENCOUNTER — Other Ambulatory Visit: Payer: Self-pay | Admitting: Internal Medicine

## 2020-10-06 ENCOUNTER — Telehealth: Payer: Self-pay | Admitting: Internal Medicine

## 2020-10-06 ENCOUNTER — Ambulatory Visit (INDEPENDENT_AMBULATORY_CARE_PROVIDER_SITE_OTHER): Payer: Medicare Other | Admitting: Internal Medicine

## 2020-10-06 ENCOUNTER — Encounter: Payer: Self-pay | Admitting: Internal Medicine

## 2020-10-06 ENCOUNTER — Other Ambulatory Visit: Payer: Self-pay

## 2020-10-06 VITALS — BP 130/77 | HR 70 | Ht 63.0 in | Wt 176.0 lb

## 2020-10-06 DIAGNOSIS — E785 Hyperlipidemia, unspecified: Secondary | ICD-10-CM

## 2020-10-06 DIAGNOSIS — T466X5D Adverse effect of antihyperlipidemic and antiarteriosclerotic drugs, subsequent encounter: Secondary | ICD-10-CM | POA: Diagnosis not present

## 2020-10-06 DIAGNOSIS — I2510 Atherosclerotic heart disease of native coronary artery without angina pectoris: Secondary | ICD-10-CM

## 2020-10-06 DIAGNOSIS — M791 Myalgia, unspecified site: Secondary | ICD-10-CM

## 2020-10-06 DIAGNOSIS — T466X5A Adverse effect of antihyperlipidemic and antiarteriosclerotic drugs, initial encounter: Secondary | ICD-10-CM

## 2020-10-06 MED ORDER — NEXLETOL 180 MG PO TABS: 1.0000 | ORAL_TABLET | Freq: Every day | ORAL | 3 refills | Status: DC

## 2020-10-06 MED ORDER — NEXLETOL 180 MG PO TABS: 1.0000 | ORAL_TABLET | Freq: Every day | ORAL | 11 refills | Status: DC

## 2020-10-06 NOTE — Telephone Encounter (Signed)
PA for nexletol 180mg  daily submitted via CMM (Key: [id number])  Once approved, patient would prefer Rx be sent to [facility]

## 2020-10-06 NOTE — Addendum Note (Signed)
Addended by: Fidel Levy on: 10/06/2020 04:06 PM   Modules accepted: Orders

## 2020-10-06 NOTE — Patient Instructions (Signed)
Medication Instructions:  START Nexletol 180mg  daily in addition to current therapy   *If you need a refill on your cardiac medications before your next appointment, please call your pharmacy*   Lab Work: FASTING lab work in about 3 months -- complete about 1 week before next visit with Dr. Debara Pickett   If you have labs (blood work) drawn today and your tests are completely normal, you will receive your results only by: [place] (if you have MyChart) OR A paper copy in the mail If you have any lab test that is abnormal or we need to change your treatment, we will call you to review the results.   Testing/Procedures: NONE   Follow-Up: At [facility], you and your health needs are our priority.  As part of our continuing mission to provide you with exceptional heart care, we have created designated Provider Care Teams.  These Care Teams include your primary Cardiologist (physician) and Advanced Practice Providers (APPs -  Physician Assistants and Nurse Practitioners) who all work together to provide you with the care you need, when you need it.  We recommend signing up for the patient portal called "MyChart".  Sign up information is provided on this After Visit Summary.  MyChart is used to connect with patients for Virtual Visits (Telemedicine).  Patients are able to view lab/test results, encounter notes, upcoming appointments, etc.  Non-urgent messages can be sent to your provider as well.   To learn more about what you can do with MyChart, go to [email or web address].    Your next appointment:   3-4 month(s) - lipid clinic  The format for your next appointment:   In Person  Provider:   K. Mali Hilty, MD   Other Instructions

## 2020-10-06 NOTE — Telephone Encounter (Signed)
Approvedtoday PA Case: [id number], Status: Approved, Coverage Starts on: 04/02/2020 12:00:00 AM, Coverage Ends on: 04/01/2021 12:00:00 AM

## 2020-10-06 NOTE — Progress Notes (Signed)
LIPID CLINIC CONSULT NOTE  Chief Complaint:  Follow-up dyslipidemia  Primary Care Physician: Biagio Borg, MD  Primary Cardiologist:  Quay Burow, MD  HPI:  Heather Merritt is a 65 y.o. female who is being seen today for the evaluation of dyslipidemia at the request of Biagio Borg, MD. This is a 65 year old female patient of Dr. Alvester Chou who was referred by Almyra Deforest, PA-C, for management of dyslipidemia.  Unfortunately she recently had chest pain was found to have a non-STEMI.  Cath in July 2020 demonstrated some coronary disease specifically in the right coronary artery requiring a drug-eluting stent placement.  Since then she has done very well without recurrent chest pain.  She started cardiac rehabilitation.  She does have a longstanding history of dyslipidemia including family history of coronary disease.  She had previously been on a number of different statin medications including atorvastatin, rosuvastatin, simvastatin, pravastatin and others with significant myalgias, particularly at the higher doses.  She had been taking atorvastatin 10 mg every other day prior to this event and was convinced to take it on a daily basis afterwards.  So far that seems to be reasonably well tolerated with some mild myalgias.  She has not had reassessment of her cholesterol since July however her total cholesterol the time was 204, triglycerides 280, HDL 36 and LDL of 112.  05/08/2019  Heather Merritt returns today for follow-up.  Overall she is doing fairly well.  Her cholesterol has improved on the Zetia.  LDL is down to 81 from 113.  Total cholesterol is 158 with triglycerides increased to 167.  This is been high in the past.  She thinks is mostly dietary related.  She is on over-the-counter fish oil which I have advised against due to lack of trial data on this.  We discussed further at length today about Vascepa and how this could be a good addition to her regimen because of the significant  cardiovascular risk reduction that was seen in the reduce it trial.  09/10/2019  Heather Merritt is seen today in follow-up.  Unfortunately she could not afford Vascepa but did make some dietary changes.  She started taking over-the-counter fish oil.  She noted that her LDL cholesterol is actually gone now up and her triglycerides though have come down.  Most recent lipid showed total cholesterol 171, triglycerides 154 and LDL of 99.  I advised that there was no data for cardiovascular risk reduction with over-the-counter fish oils and I would recommend not taking it.  Since she cannot afford the Vascepa, we will have to focus on better LDL control.  She is only on low-dose atorvastatin due to side effects.  05/30/2020  Heather Merritt is seen today in follow-up.  Unfortunately she was intolerant of Repatha as well.  She did take 2 doses but had what is described as a immune or allergic response.  She had some throat tightening and facial flushing.  This improved with antihistamine.  She did have significant lipid lowering however on the medication with total cholesterol now 85, HDL 48, triglycerides 111 and LDL 17.  Unfortunately, due to her allergic response, this will not be an option for her going forward.  10/06/2020  Heather Merritt returns today for follow-up.  She is having issues with Repatha that caused allergic symptoms.  Her cholesterol however did respond as above significantly with LDL down to 17.  Repeat testing about 2 weeks ago showed total cholesterol 172, triglycerides 165, HDL 43  and LDL 100.  She is able to tolerate atorvastatin 10 mg daily but remains above target.  PMHx:  Past Medical History:  Diagnosis Date   Allergy    SEASONAL   ANXIETY 10/28/2007   Arthritis    CAD (coronary artery disease), native coronary artery    a. Cath 10/06/2018 - S/p DES to [facility]; medical therapy for high grade ostial/pro 2nd digonal    Cataracts, bilateral    immature   Chronic back pain    Coronary  artery disease    DIVERTICULOSIS, COLON 10/28/2007   Dry skin    red patch on right knee area   GERD 10/28/2007   takes Prevacid daily   H/O hiatal hernia    HLD (hyperlipidemia) 12/12/2017   Hx of colonic polyps    HYPERLIPIDEMIA 10/28/2007   unable to take meds d/t allergies   HYPERTENSION 10/28/2007   takes Diovan daily   Internal hemorrhoids    Irritable bowel syndrome (IBS)    Joint pain    Joint swelling    LIVER FUNCTION TESTS, ABNORMAL, HX OF 10/29/2007   Lumbar disc disease    Migraine    Myocardial infarction ([place])    Obesity    OSTEOPENIA 10/29/2007   Osteoporosis    Presbyacusis 01/04/2010   Rosacea    Seasonal allergies    takes Clarinex daily   Vertigo    hx of   Vitamin D deficiency    takes Vit D daily    Past Surgical History:  Procedure Laterality Date   ANTERIOR CERVICAL DECOMP/DISCECTOMY FUSION  08/02/2011   Procedure: ANTERIOR CERVICAL DECOMPRESSION/DISCECTOMY FUSION 3 LEVELS;  Surgeon: Elaina Hoops, MD;  Location: [place] [facility];  Service: Neurosurgery;  Laterality: N/A;  Cervical Three-Four, Cervical Four-Five, Cervical Five-Six  Anterior Cervical Decompression Fusion    CARDIAC CATHETERIZATION     COLONOSCOPY  2001/2013   CORONARY STENT INTERVENTION N/A 10/06/2018   Procedure: CORONARY STENT INTERVENTION;  Surgeon: Lorretta Harp, MD;  Location: [place] [facility];  Service: Cardiovascular;  Laterality: N/A;   ESOPHAGOGASTRODUODENOSCOPY     LEFT HEART CATH AND CORONARY ANGIOGRAPHY N/A 10/06/2018   Procedure: LEFT HEART CATH AND CORONARY ANGIOGRAPHY;  Surgeon: Lorretta Harp, MD;  Location: [place] [facility];  Service: Cardiovascular;  Laterality: N/A;   POLYPECTOMY     TONSILLECTOMY     TONSILLECTOMY  at age 52    FAMHx:  Family History  Problem Relation Age of Onset   Atrial fibrillation Mother    Hyperlipidemia Mother    Stroke Mother    Heart disease Mother    Atrial fibrillation Father    Hypothyroidism Sister    Hypothyroidism Maternal  Grandmother    Diabetes Maternal Grandmother    Anesthesia problems Neg Hx    Hypotension Neg Hx    Malignant hyperthermia Neg Hx    Pseudochol deficiency Neg Hx    Colon cancer Neg Hx    Stomach cancer Neg Hx    Rectal cancer Neg Hx    Esophageal cancer Neg Hx     SOCHx:   reports that she has quit smoking. Her smoking use included cigarettes. She has never used smokeless tobacco. She reports previous alcohol use. She reports that she does not use drugs.  ALLERGIES:  Allergies  Allergen Reactions   Penicillins Hives, Itching and Rash    Did it involve swelling of the face/tongue/throat, SOB, or low BP? Unknown Did it involve sudden or severe rash/hives, skin peeling,  or any reaction on the inside of your mouth or nose? Unknown Did you need to seek medical attention at a hospital or doctor's office? Unknown When did it last happen?      pt cant recall If all above answers are "NO", may proceed with cephalosporin use.    Rosuvastatin Other (See Comments)    REACTION: muscle weakness   Simvastatin Other (See Comments)    REACTION: muscle weakness   Statins Other (See Comments)    Myalgias and weakness   Sulfonamide Derivatives Other (See Comments)    Nausea, vomiting, abd pain   Repatha [Evolocumab] Swelling    Swelling, itchy eyes, mouth swelling, red/puffy/hot face   Brilinta [Ticagrelor] Rash    Itchy rash to torso   Doxycycline Hyclate Itching and Rash   Sulfa Antibiotics Other (See Comments)    Nausea, vomiting, abd pain    ROS: Pertinent items noted in HPI and remainder of comprehensive ROS otherwise negative.  HOME MEDS: Current Outpatient Medications on File Prior to Visit  Medication Sig Dispense Refill   aspirin 81 MG tablet Take 81 mg by mouth daily.     atorvastatin (LIPITOR) 10 MG tablet Take 10 mg by mouth daily.     Calcium Citrate (CITRACAL PO) Take 2 tablets by mouth daily.      Cholecalciferol (VITAMIN D) 2000 UNITS CAPS Take 2,000 Units by mouth  daily.     clopidogrel (PLAVIX) 75 MG tablet TAKE 1 TABLET EVERY DAY 90 tablet 2   esomeprazole (NEXIUM) 40 MG capsule TAKE 1 CAPSULE EVERY DAY 90 capsule 4   ezetimibe (ZETIA) 10 MG tablet TAKE 1 TABLET EVERY DAY 90 tablet 3   famotidine (PEPCID) 40 MG tablet Take 1 tablet (40 mg total) by mouth daily. 90 tablet 4   metoprolol tartrate (LOPRESSOR) 25 MG tablet TAKE 1/2 TABLET TWICE DAILY 90 tablet 2   metroNIDAZOLE (METROGEL) 0.75 % gel Apply 1 application topically at bedtime. 45 g 0   Multiple Vitamin (MULITIVITAMIN WITH MINERALS) TABS Take 1 tablet by mouth daily.     nitroGLYCERIN (NITROSTAT) 0.4 MG SL tablet Place 1 tablet (0.4 mg total) under the tongue every 5 (five) minutes x 3 doses as needed for chest pain. 25 tablet 12   pimecrolimus (ELIDEL) 1 % cream Apply 1 application topically every morning. 30 g 0   valsartan-hydrochlorothiazide (DIOVAN-HCT) 160-12.5 MG tablet TAKE 1 TABLET EVERY DAY 90 tablet 2   No current facility-administered medications on file prior to visit.    LABS/IMAGING: No results found for this or any previous visit (from the past 48 hour(s)). No results found.  LIPID PANEL:    Component Value Date/Time   CHOL 172 09/23/2020 0808   TRIG 165 (H) 09/23/2020 0808   HDL 43 09/23/2020 0808   CHOLHDL 4.0 09/23/2020 0808   CHOLHDL 4 09/12/2020 1217   VLDL 30.4 09/12/2020 1217   LDLCALC 100 (H) 09/23/2020 0808   LDLDIRECT 177.0 06/05/2017 1301    WEIGHTS: Wt Readings from Last 3 Encounters:  10/06/20 176 lb (79.8 kg)  09/12/20 174 lb (78.9 kg)  05/30/20 172 lb 3.2 oz (78.1 kg)    VITALS: BP 130/77   Pulse 70   Ht 5\' 3"  (1.6 m)   Wt 176 lb (79.8 kg)   SpO2 96%   BMI 31.18 kg/m   EXAM: Deferred  EKG: Deferred  ASSESSMENT: Mixed dyslipidemia, goal LDL <70 Relative statin intolerance Allergic reaction to Repatha CAD with recent NSTEMI and PCI to the  RCA (10/2018)  PLAN: 1.   Heather Merritt had allergic symptoms related to [place].  Her LDL  is now at 100 with target less than 70.  She is on ezetimibe and low-dose atorvastatin 10 mg daily.  I would like to consider Nexletol 180 mg daily to her regimen.  This should provide another 20 to 25% improvement.  Is relatively well-tolerated except for possible side effects including tendinitis/tendinopathy and/or possibly exacerbation of gout in existing patients.  She does not have issues with this.  I think she will tolerate it well.  We will provide samples if available today and see Prior authorization.  Plan repeat lipids in 3 months and follow-up with me afterwards.  Pixie Casino, MD, [facility], [place] Director of the Advanced Lipid Disorders &  Cardiovascular Risk Reduction Clinic Diplomate of the American Board of Clinical Lipidology Attending Cardiologist  Direct Dial: [phone]  Fax: [phone]  Website:  [email or web address]  Nadean Corwin Malique Driskill 10/06/2020, 9:22 AM

## 2020-12-07 ENCOUNTER — Other Ambulatory Visit: Payer: Self-pay | Admitting: Internal Medicine

## 2021-02-08 LAB — LIPID PANEL
Chol/HDL Ratio: 2.8 ratio (ref 0.0–4.4)
Cholesterol, Total: 117 mg/dL (ref 100–199)
HDL: 42 mg/dL (ref >39)
LDL Chol Calc (NIH): 51 mg/dL (ref 0–99)
Triglycerides: 139 mg/dL (ref 0–149)
VLDL Cholesterol Cal: 24 mg/dL (ref 5–40)

## 2021-02-13 ENCOUNTER — Encounter: Payer: Self-pay | Admitting: Internal Medicine

## 2021-02-13 ENCOUNTER — Other Ambulatory Visit: Payer: Self-pay

## 2021-02-13 ENCOUNTER — Ambulatory Visit (INDEPENDENT_AMBULATORY_CARE_PROVIDER_SITE_OTHER): Payer: Medicare Other | Admitting: Internal Medicine

## 2021-02-13 VITALS — BP 128/78 | HR 68 | Ht 63.5 in | Wt 175.0 lb

## 2021-02-13 DIAGNOSIS — E785 Hyperlipidemia, unspecified: Secondary | ICD-10-CM

## 2021-02-13 DIAGNOSIS — T466X5D Adverse effect of antihyperlipidemic and antiarteriosclerotic drugs, subsequent encounter: Secondary | ICD-10-CM

## 2021-02-13 DIAGNOSIS — I2510 Atherosclerotic heart disease of native coronary artery without angina pectoris: Secondary | ICD-10-CM | POA: Diagnosis not present

## 2021-02-13 DIAGNOSIS — M791 Myalgia, unspecified site: Secondary | ICD-10-CM | POA: Diagnosis not present

## 2021-02-13 MED ORDER — ATORVASTATIN CALCIUM 10 MG PO TABS: 10.0000 mg | ORAL_TABLET | Freq: Every day | ORAL | 3 refills | Status: DC

## 2021-02-13 NOTE — Patient Instructions (Signed)
Medication Instructions:  Your physician recommends that you continue on your current medications as directed. Please refer to the Current Medication list given to you today.  *If you need a refill on your cardiac medications before your next appointment, please call your pharmacy*   Lab Work: FASTING lab work to check cholesterol in about 6 months   If you have labs (blood work) drawn today and your tests are completely normal, you will receive your results only by: [place] (if you have MyChart) OR A paper copy in the mail If you have any lab test that is abnormal or we need to change your treatment, we will call you to review the results.   Testing/Procedures: NONE   Follow-Up: At [facility], you and your health needs are our priority.  As part of our continuing mission to provide you with exceptional heart care, we have created designated Provider Care Teams.  These Care Teams include your primary Cardiologist (physician) and Advanced Practice Providers (APPs -  Physician Assistants and Nurse Practitioners) who all work together to provide you with the care you need, when you need it.  We recommend signing up for the patient portal called "MyChart".  Sign up information is provided on this After Visit Summary.  MyChart is used to connect with patients for Virtual Visits (Telemedicine).  Patients are able to view lab/test results, encounter notes, upcoming appointments, etc.  Non-urgent messages can be sent to your provider as well.   To learn more about what you can do with MyChart, go to [email or web address].    Your next appointment:   6 months with Dr. Debara Pickett

## 2021-02-13 NOTE — Progress Notes (Signed)
LIPID CLINIC CONSULT NOTE  Chief Complaint:  Follow-up dyslipidemia  Primary Care Physician: Biagio Borg, MD  Primary Cardiologist:  Quay Burow, MD  HPI:  ALLECIA Merritt is a 65 y.o. female who is being seen today for the evaluation of dyslipidemia at the request of Biagio Borg, MD. This is a 65 year old female patient of Dr. Alvester Merritt who was referred by Almyra Deforest, PA-C, for management of dyslipidemia.  Unfortunately she recently had chest pain was found to have a non-STEMI.  Cath in July 2020 demonstrated some coronary disease specifically in the right coronary artery requiring a drug-eluting stent placement.  Since then she has done very well without recurrent chest pain.  She started cardiac rehabilitation.  She does have a longstanding history of dyslipidemia including family history of coronary disease.  She had previously been on a number of different statin medications including atorvastatin, rosuvastatin, simvastatin, pravastatin and others with significant myalgias, particularly at the higher doses.  She had been taking atorvastatin 10 mg every other day prior to this event and was convinced to take it on a daily basis afterwards.  So far that seems to be reasonably well tolerated with some mild myalgias.  She has not had reassessment of her cholesterol since July however her total cholesterol the time was 204, triglycerides 280, HDL 36 and LDL of 112.  05/08/2019  Heather Merritt returns today for follow-up.  Overall she is doing fairly well.  Her cholesterol has improved on the Zetia.  LDL is down to 81 from 113.  Total cholesterol is 158 with triglycerides increased to 167.  This is been high in the past.  She thinks is mostly dietary related.  She is on over-the-counter fish oil which I have advised against due to lack of trial data on this.  We discussed further at length today about Vascepa and how this could be a good addition to her regimen because of the significant  cardiovascular risk reduction that was seen in the reduce it trial.  09/10/2019  Heather Merritt is seen today in follow-up.  Unfortunately she could not afford Vascepa but did make some dietary changes.  She started taking over-the-counter fish oil.  She noted that her LDL cholesterol is actually gone now up and her triglycerides though have come down.  Most recent lipid showed total cholesterol 171, triglycerides 154 and LDL of 99.  I advised that there was no data for cardiovascular risk reduction with over-the-counter fish oils and I would recommend not taking it.  Since she cannot afford the Vascepa, we will have to focus on better LDL control.  She is only on low-dose atorvastatin due to side effects.  05/30/2020  [name] is seen today in follow-up.  Unfortunately she was intolerant of Repatha as well.  She did take 2 doses but had what is described as a immune or allergic response.  She had some throat tightening and facial flushing.  This improved with antihistamine.  She did have significant lipid lowering however on the medication with total cholesterol now 85, HDL 48, triglycerides 111 and LDL 17.  Unfortunately, due to her allergic response, this will not be an option for her going forward.  10/06/2020  Heather Merritt returns today for follow-up.  She is having issues with Repatha that caused allergic symptoms.  Her cholesterol however did respond as above significantly with LDL down to 17.  Repeat testing about 2 weeks ago showed total cholesterol 172, triglycerides 165, HDL 43  and LDL 100.  She is able to tolerate atorvastatin 10 mg daily but remains above target.  02/13/2021  Heather Merritt returns today for follow-up.  She reported initially having some GI upset related to [place] however had stopped it for a while and then restarted it with no issues.  She has remained on it in addition to ezetimibe and low-dose atorvastatin 10 mg daily.  Lipids are markedly improved with total  cholesterol 117, triglycerides 139, HDL 42 and LDL 51, down from 100.  PMHx:  Past Medical History:  Diagnosis Date   Allergy    SEASONAL   ANXIETY 10/28/2007   Arthritis    CAD (coronary artery disease), native coronary artery    a. Cath 10/06/2018 - S/p DES to [facility]; medical therapy for high grade ostial/pro 2nd digonal    Cataracts, bilateral    immature   Chronic back pain    Coronary artery disease    DIVERTICULOSIS, COLON 10/28/2007   Dry skin    red patch on right knee area   GERD 10/28/2007   takes Prevacid daily   H/O hiatal hernia    HLD (hyperlipidemia) 12/12/2017   Hx of colonic polyps    HYPERLIPIDEMIA 10/28/2007   unable to take meds d/t allergies   HYPERTENSION 10/28/2007   takes Diovan daily   Internal hemorrhoids    Irritable bowel syndrome (IBS)    Joint pain    Joint swelling    LIVER FUNCTION TESTS, ABNORMAL, HX OF 10/29/2007   Lumbar disc disease    Migraine    Myocardial infarction ([place])    Obesity    OSTEOPENIA 10/29/2007   Osteoporosis    Presbyacusis 01/04/2010   Rosacea    Seasonal allergies    takes Clarinex daily   Vertigo    hx of   Vitamin D deficiency    takes Vit D daily    Past Surgical History:  Procedure Laterality Date   ANTERIOR CERVICAL DECOMP/DISCECTOMY FUSION  08/02/2011   Procedure: ANTERIOR CERVICAL DECOMPRESSION/DISCECTOMY FUSION 3 LEVELS;  Surgeon: Elaina Hoops, MD;  Location: [place] [facility];  Service: Neurosurgery;  Laterality: N/A;  Cervical Three-Four, Cervical Four-Five, Cervical Five-Six  Anterior Cervical Decompression Fusion    CARDIAC CATHETERIZATION     COLONOSCOPY  2001/2013   CORONARY STENT INTERVENTION N/A 10/06/2018   Procedure: CORONARY STENT INTERVENTION;  Surgeon: Lorretta Harp, MD;  Location: [place] [facility];  Service: Cardiovascular;  Laterality: N/A;   ESOPHAGOGASTRODUODENOSCOPY     LEFT HEART CATH AND CORONARY ANGIOGRAPHY N/A 10/06/2018   Procedure: LEFT HEART CATH AND CORONARY ANGIOGRAPHY;  Surgeon: Lorretta Harp, MD;  Location: [place] [facility];  Service: Cardiovascular;  Laterality: N/A;   POLYPECTOMY     TONSILLECTOMY     TONSILLECTOMY  at age 26    FAMHx:  Family History  Problem Relation Age of Onset   Atrial fibrillation Mother    Hyperlipidemia Mother    Stroke Mother    Heart disease Mother    Atrial fibrillation Father    Hypothyroidism Sister    Hypothyroidism Maternal Grandmother    Diabetes Maternal Grandmother    Anesthesia problems Neg Hx    Hypotension Neg Hx    Malignant hyperthermia Neg Hx    Pseudochol deficiency Neg Hx    Colon cancer Neg Hx    Stomach cancer Neg Hx    Rectal cancer Neg Hx    Esophageal cancer Neg Hx     SOCHx:  reports that she has quit smoking. Her smoking use included cigarettes. She has never used smokeless tobacco. She reports that she does not currently use alcohol. She reports that she does not use drugs.  ALLERGIES:  Allergies  Allergen Reactions   Penicillins Hives, Itching and Rash    Did it involve swelling of the face/tongue/throat, SOB, or low BP? Unknown Did it involve sudden or severe rash/hives, skin peeling, or any reaction on the inside of your mouth or nose? Unknown Did you need to seek medical attention at a hospital or doctor's office? Unknown When did it last happen?      pt cant recall If all above answers are "NO", may proceed with cephalosporin use.    Rosuvastatin Other (See Comments)    REACTION: muscle weakness   Simvastatin Other (See Comments)    REACTION: muscle weakness   Statins Other (See Comments)    Myalgias and weakness   Sulfonamide Derivatives Other (See Comments)    Nausea, vomiting, abd pain   Repatha [Evolocumab] Swelling    Swelling, itchy eyes, mouth swelling, red/puffy/hot face   Brilinta [Ticagrelor] Rash    Itchy rash to torso   Doxycycline Hyclate Itching and Rash   Sulfa Antibiotics Other (See Comments)    Nausea, vomiting, abd pain    ROS: Pertinent items noted in HPI  and remainder of comprehensive ROS otherwise negative.  HOME MEDS: Current Outpatient Medications on File Prior to Visit  Medication Sig Dispense Refill   aspirin 81 MG tablet Take 81 mg by mouth daily.     atorvastatin (LIPITOR) 10 MG tablet Take 10 mg by mouth daily.     Bempedoic Acid (NEXLETOL) 180 MG TABS Take 1 Dose by mouth daily. 90 tablet 3   Calcium Citrate (CITRACAL PO) Take 2 tablets by mouth daily.      Cholecalciferol (VITAMIN D) 2000 UNITS CAPS Take 2,000 Units by mouth daily.     clopidogrel (PLAVIX) 75 MG tablet TAKE 1 TABLET EVERY DAY 90 tablet 2   esomeprazole (NEXIUM) 40 MG capsule TAKE 1 CAPSULE EVERY DAY 90 capsule 4   ezetimibe (ZETIA) 10 MG tablet TAKE 1 TABLET EVERY DAY 90 tablet 3   famotidine (PEPCID) 40 MG tablet Take 1 tablet (40 mg total) by mouth daily. 90 tablet 4   metoprolol tartrate (LOPRESSOR) 25 MG tablet TAKE 1/2 TABLET TWICE DAILY 90 tablet 2   metroNIDAZOLE (METROGEL) 0.75 % gel Apply 1 application topically at bedtime. 45 g 0   Multiple Vitamin (MULITIVITAMIN WITH MINERALS) TABS Take 1 tablet by mouth daily.     nitroGLYCERIN (NITROSTAT) 0.4 MG SL tablet Place 1 tablet (0.4 mg total) under the tongue every 5 (five) minutes x 3 doses as needed for chest pain. 25 tablet 12   pimecrolimus (ELIDEL) 1 % cream Apply 1 application topically every morning. 30 g 0   valsartan-hydrochlorothiazide (DIOVAN-HCT) 160-12.5 MG tablet TAKE 1 TABLET EVERY DAY 90 tablet 2   No current facility-administered medications on file prior to visit.    LABS/IMAGING: No results found for this or any previous visit (from the past 48 hour(s)). No results found.  LIPID PANEL:    Component Value Date/Time   CHOL 117 02/08/2021 0923   TRIG 139 02/08/2021 0923   HDL 42 02/08/2021 0923   CHOLHDL 2.8 02/08/2021 0923   CHOLHDL 4 09/12/2020 1217   VLDL 30.4 09/12/2020 1217   LDLCALC 51 02/08/2021 0923   LDLDIRECT 177.0 06/05/2017 1301    WEIGHTS: Wt  Readings from Last 3  Encounters:  02/13/21 175 lb (79.4 kg)  10/06/20 176 lb (79.8 kg)  09/12/20 174 lb (78.9 kg)    VITALS: BP 128/78   Pulse 68   Ht 5' 3.5" (1.613 m)   Wt 175 lb (79.4 kg)   SpO2 98%   BMI 30.51 kg/m   EXAM: Deferred  EKG: Deferred  ASSESSMENT: Mixed dyslipidemia, goal LDL <55 (very high risk) Relative statin intolerance Allergic reaction to Repatha CAD with recent NSTEMI and PCI to the RCA (10/2018)  PLAN: 1.   Ms. Schrecengost has reached a target LDL less than 70 and with recent NSTEMI and new changes to the guidelines meets new target for "very high risk" individuals to an LDL less than 55.  I would advise we continue on her combination of medications since we have reached her targets.  We will plan repeat lipids and follow-up with me in 6 months.  Pixie Casino, MD, [facility], [place] Director of the Advanced Lipid Disorders &  Cardiovascular Risk Reduction Clinic Diplomate of the American Board of Clinical Lipidology Attending Cardiologist  Direct Dial: [phone]  Fax: [phone]  Website:  [email or web address] 02/13/2021, 9:30 AM

## 2021-03-02 ENCOUNTER — Other Ambulatory Visit: Payer: Self-pay | Admitting: Internal Medicine

## 2021-03-14 ENCOUNTER — Ambulatory Visit (INDEPENDENT_AMBULATORY_CARE_PROVIDER_SITE_OTHER): Payer: Medicare Other | Admitting: Internal Medicine

## 2021-03-14 ENCOUNTER — Other Ambulatory Visit: Payer: Self-pay

## 2021-03-14 VITALS — BP 120/88 | HR 69 | Temp 98.1°F | Wt 178.4 lb

## 2021-03-14 DIAGNOSIS — J069 Acute upper respiratory infection, unspecified: Secondary | ICD-10-CM

## 2021-03-14 DIAGNOSIS — J310 Chronic rhinitis: Secondary | ICD-10-CM | POA: Diagnosis not present

## 2021-03-14 DIAGNOSIS — R739 Hyperglycemia, unspecified: Secondary | ICD-10-CM | POA: Diagnosis not present

## 2021-03-14 DIAGNOSIS — I2510 Atherosclerotic heart disease of native coronary artery without angina pectoris: Secondary | ICD-10-CM | POA: Diagnosis not present

## 2021-03-14 DIAGNOSIS — I10 Essential (primary) hypertension: Secondary | ICD-10-CM

## 2021-03-14 DIAGNOSIS — E782 Mixed hyperlipidemia: Secondary | ICD-10-CM

## 2021-03-14 MED ORDER — AZITHROMYCIN 250 MG PO TABS: ORAL_TABLET | ORAL | 0 refills | Status: AC

## 2021-03-14 NOTE — Progress Notes (Signed)
Patient ID: Heather Merritt, female   DOB: June 04, 1955, 65 y.o.   MRN: [id number]        Chief Complaint: follow up HTN, HLD and hyperglycemia, [place] and allergies       HPI:  Heather Merritt is a 65 y.o. female  Here with 2-3 days acute onset fever, facial pain, pressure, headache, general weakness and malaise, and greenish d/c, with mild ST and cough, but pt denies chest pain, wheezing, increased sob or doe, orthopnea, PND, increased LE swelling, palpitations, dizziness or syncope.  Does have several wks ongoing nasal allergy symptoms with clearish congestion, itch and sneezing, without fever, pain, ST, cough, swelling or wheezing.  S/p bilat cataract removed 2 mo ago.  Did have reaction to the last covid in nov 1.  Decline flu shot today - plans to get at the pharmacy later soon.   Wt Readings from Last 3 Encounters:  03/14/21 178 lb 6.4 oz (80.9 kg)  02/13/21 175 lb (79.4 kg)  10/06/20 176 lb (79.8 kg)   BP Readings from Last 3 Encounters:  03/14/21 120/88  02/13/21 128/78  10/06/20 130/77         Past Medical History:  Diagnosis Date   Allergy    SEASONAL   ANXIETY 10/28/2007   Arthritis    CAD (coronary artery disease), native coronary artery    a. Cath 10/06/2018 - S/[facility] DES [facility]; medical therapy for high grade ostial/pro 2nd digonal    Cataracts, bilateral    immature   Chronic back pain    Coronary artery disease    DIVERTICULOSIS, COLON 10/28/2007   Dry skin    red patch on right knee area   GERD 10/28/2007   takes Prevacid daily   H/O hiatal hernia    HLD (hyperlipidemia) 12/12/2017   Hx of colonic polyps    HYPERLIPIDEMIA 10/28/2007   unable to take meds d/t allergies   HYPERTENSION 10/28/2007   takes Diovan daily   Internal hemorrhoids    Irritable bowel syndrome (IBS)    Joint pain    Joint swelling    LIVER FUNCTION TESTS, ABNORMAL, HX OF 10/29/2007   Lumbar disc disease    Migraine    Myocardial infarction ([place])    Obesity    OSTEOPENIA 10/29/2007    Osteoporosis    Presbyacusis 01/04/2010   Rosacea    Seasonal allergies    takes Clarinex daily   Vertigo    hx of   Vitamin D deficiency    takes Vit D daily   Past Surgical History:  Procedure Laterality Date   ANTERIOR CERVICAL DECOMP/DISCECTOMY FUSION  08/02/2011   Procedure: ANTERIOR CERVICAL DECOMPRESSION/DISCECTOMY FUSION 3 LEVELS;  Surgeon: Elaina Hoops, MD;  Location: [facility];  Service: Neurosurgery;  Laterality: N/A;  Cervical Three-Four, Cervical Four-Five, Cervical Five-Six  Anterior Cervical Decompression Fusion    CARDIAC CATHETERIZATION     COLONOSCOPY  2001/2013   CORONARY STENT INTERVENTION N/A 10/06/2018   Procedure: CORONARY STENT INTERVENTION;  Surgeon: Lorretta Harp, MD;  Location: [place] [facility];  Service: Cardiovascular;  Laterality: N/A;   ESOPHAGOGASTRODUODENOSCOPY     LEFT HEART CATH AND CORONARY ANGIOGRAPHY N/A 10/06/2018   Procedure: LEFT HEART CATH AND CORONARY ANGIOGRAPHY;  Surgeon: Lorretta Harp, MD;  Location: [facility];  Service: Cardiovascular;  Laterality: N/A;   POLYPECTOMY     TONSILLECTOMY     TONSILLECTOMY  at age 61    reports that she has  quit smoking. Her smoking use included cigarettes. She has never used smokeless tobacco. She reports that she does not currently use alcohol. She reports that she does not use drugs. family history includes Atrial fibrillation in her father and mother; Diabetes in her maternal grandmother; Heart disease in her mother; Hyperlipidemia in her mother; Hypothyroidism in her maternal grandmother and sister; Stroke in her mother. Allergies  Allergen Reactions   Penicillins Hives, Itching and Rash    Did it involve swelling of the face/tongue/throat, SOB, or low BP? Unknown Did it involve sudden or severe rash/hives, skin peeling, or any reaction on the inside of your mouth or nose? Unknown Did you need to seek medical attention at a hospital or doctor's office? Unknown When did it last happen?       pt cant recall If all above answers are NO, may proceed with cephalosporin use.    Rosuvastatin Other (See Comments)    REACTION: muscle weakness   Simvastatin Other (See Comments)    REACTION: muscle weakness   Statins Other (See Comments)    Myalgias and weakness   Sulfonamide Derivatives Other (See Comments)    Nausea, vomiting, abd pain   Repatha [Evolocumab] Swelling    Swelling, itchy eyes, mouth swelling, red/puffy/hot face   Brilinta [Ticagrelor] Rash    Itchy rash to torso   Doxycycline Hyclate Itching and Rash   Sulfa Antibiotics Other (See Comments)    Nausea, vomiting, abd pain   Current Outpatient Medications on File Prior to Visit  Medication Sig Dispense Refill   aspirin 81 MG tablet Take 81 mg by mouth daily.     atorvastatin (LIPITOR) 10 MG tablet Take 1 tablet (10 mg total) by mouth daily. 90 tablet 3   Bempedoic Acid (NEXLETOL) 180 MG TABS Take 1 Dose by mouth daily. 90 tablet 3   Calcium Citrate (CITRACAL PO) Take 2 tablets by mouth daily.      Cholecalciferol (VITAMIN D) 2000 UNITS CAPS Take 2,000 Units by mouth daily.     clopidogrel (PLAVIX) 75 MG tablet TAKE 1 TABLET EVERY DAY 90 tablet 2   esomeprazole (NEXIUM) 40 MG capsule TAKE 1 CAPSULE EVERY DAY 90 capsule 4   ezetimibe (ZETIA) 10 MG tablet TAKE 1 TABLET EVERY DAY 90 tablet 3   famotidine (PEPCID) 40 MG tablet Take 1 tablet (40 mg total) by mouth daily. 90 tablet 4   metoprolol tartrate (LOPRESSOR) 25 MG tablet TAKE 1/2 TABLET TWICE DAILY 90 tablet 2   metroNIDAZOLE (METROGEL) 0.75 % gel Apply 1 application topically at bedtime. 45 g 0   Multiple Vitamin (MULITIVITAMIN WITH MINERALS) TABS Take 1 tablet by mouth daily.     nitroGLYCERIN (NITROSTAT) 0.4 MG SL tablet Place 1 tablet (0.4 mg total) under the tongue every 5 (five) minutes x 3 doses as needed for chest pain. 25 tablet 12   pimecrolimus (ELIDEL) 1 % cream Apply 1 application topically every morning. 30 g 0   valsartan-hydrochlorothiazide  (DIOVAN-HCT) 160-12.5 MG tablet TAKE 1 TABLET EVERY DAY 90 tablet 2   No current facility-administered medications on file prior to visit.        ROS:  All others reviewed and negative.  Objective        PE:  BP 120/88 (BP Location: Left Arm, Patient Position: Sitting, Cuff Size: Normal)    Pulse 69    Temp 98.1 F (36.7 C) (Oral)    Wt 178 lb 6.4 oz (80.9 kg)    SpO2 98%  BMI 31.11 kg/m                 Constitutional: Pt appears in NAD, mild ill               HENT: Head: NCAT.                Right Ear: External ear normal.                 Left Ear: External ear normal. , Bilat tm's with mild erythema.  Max sinus areas mild tender.  Pharynx with mild erythema, no exudate               Eyes: . Pupils are equal, round, and reactive to light. Conjunctivae and EOM are normal               Nose: without d/c or deformity               Neck: Neck supple. Gross normal ROM               Cardiovascular: Normal rate and regular rhythm.                 Pulmonary/Chest: Effort normal and breath sounds without rales or wheezing.                Abd:  Soft, NT, ND, + BS, no organomegaly               Neurological: Pt is alert. At baseline orientation, motor grossly intact               Skin: Skin is warm. No rashes, no other new lesions, LE edema - none               Psychiatric: Pt behavior is normal without agitation   Micro: none  Cardiac tracings I have personally interpreted today:  none  Pertinent Radiological findings (summarize): none   Lab Results  Component Value Date   WBC 3.9 (L) 09/12/2020   HGB 13.7 09/12/2020   HCT 41.2 09/12/2020   PLT 278.0 09/12/2020   GLUCOSE 91 09/12/2020   CHOL 117 02/08/2021   TRIG 139 02/08/2021   HDL 42 02/08/2021   LDLDIRECT 177.0 06/05/2017   LDLCALC 51 02/08/2021   ALT 31 09/12/2020   AST 24 09/12/2020   NA 140 09/12/2020   K 4.0 09/12/2020   CL 100 09/12/2020   CREATININE 0.68 09/12/2020   BUN 20 09/12/2020   CO2 32 09/12/2020   TSH  1.46 09/12/2020   INR 0.9 10/03/2018   HGBA1C 5.6 09/12/2020   Assessment/Plan:  Heather Merritt is a 65 y.o. White or Caucasian [1] female with  has a past medical history of Allergy, ANXIETY (10/28/2007), Arthritis, CAD (coronary artery disease), native coronary artery, Cataracts, bilateral, Chronic back pain, Coronary artery disease, DIVERTICULOSIS, COLON (10/28/2007), Dry skin, GERD (10/28/2007), H/O hiatal hernia, HLD (hyperlipidemia) (12/12/2017), colonic polyps, HYPERLIPIDEMIA (10/28/2007), HYPERTENSION (10/28/2007), Internal hemorrhoids, Irritable bowel syndrome (IBS), Joint pain, Joint swelling, LIVER FUNCTION TESTS, ABNORMAL, HX OF (10/29/2007), Lumbar disc disease, Migraine, Myocardial infarction ([place]), Obesity, OSTEOPENIA (10/29/2007), Osteoporosis, Presbyacusis (01/04/2010), Rosacea, Seasonal allergies, Vertigo, and Vitamin D deficiency.  URI (upper respiratory infection) Mild to mod, for antibx course,  to f/u any worsening symptoms or concerns  Rhinitis, chronic With mild seasonal flare - for add allegra and nasacort asd,  to f/u any worsening symptoms or concerns  Hyperglycemia Lab Results  Component Value Date  HGBA1C 5.6 09/12/2020   Stable, pt to continue current medical treatment - diet   HLD (hyperlipidemia) Lab Results  Component Value Date   LDLCALC 51 02/08/2021   Stable, pt to continue current statin lipior 10   Essential (primary) hypertension BP Readings from Last 3 Encounters:  03/14/21 120/88  02/13/21 128/78  10/06/20 130/77   Stable, pt to continue medical treatment lopressor, diovan hct  Followup: Return in about 6 months (around 09/12/2021).  Cathlean Cower, MD 03/19/2021 2:47 PM [facility]

## 2021-03-14 NOTE — Patient Instructions (Signed)
Please take all new medication as prescribed - the antibiotic  Ok to take OTC Allegra and Nasacort for the Rhinitis  Please continue all other medications as before, and refills have been done if requested.  Please have the pharmacy call with any other refills you may need.  Please continue your efforts at being more active, low cholesterol diet, and weight control  Please keep your appointments with your specialists as you may have planned - Dr Gwenlyn Found in a few weeks  Please make an Appointment to return in 6 months, or sooner if needed

## 2021-03-17 ENCOUNTER — Encounter: Payer: Self-pay | Admitting: Internal Medicine

## 2021-03-19 ENCOUNTER — Encounter: Payer: Self-pay | Admitting: Internal Medicine

## 2021-03-19 DIAGNOSIS — J069 Acute upper respiratory infection, unspecified: Secondary | ICD-10-CM | POA: Insufficient documentation

## 2021-03-19 NOTE — Assessment & Plan Note (Signed)
Lab Results  Component Value Date   LDLCALC 51 02/08/2021   Stable, pt to continue current statin lipior 10

## 2021-03-19 NOTE — Assessment & Plan Note (Signed)
With mild seasonal flare - for add allegra and nasacort asd,  to f/u any worsening symptoms or concerns

## 2021-03-19 NOTE — Assessment & Plan Note (Signed)
BP Readings from Last 3 Encounters:  03/14/21 120/88  02/13/21 128/78  10/06/20 130/77   Stable, pt to continue medical treatment lopressor, diovan hct

## 2021-03-19 NOTE — Assessment & Plan Note (Signed)
Mild to mod, for antibx course,  to f/u any worsening symptoms or concerns 

## 2021-03-19 NOTE — Assessment & Plan Note (Signed)
Lab Results  Component Value Date   HGBA1C 5.6 09/12/2020   Stable, pt to continue current medical treatment - diet

## 2021-03-22 ENCOUNTER — Telehealth: Payer: Self-pay | Admitting: Internal Medicine

## 2021-03-22 MED ORDER — AZITHROMYCIN 250 MG PO TABS: ORAL_TABLET | ORAL | 0 refills | Status: AC

## 2021-03-22 MED ORDER — AZITHROMYCIN 250 MG PO TABS: ORAL_TABLET | ORAL | 0 refills | Status: DC

## 2021-03-22 NOTE — Telephone Encounter (Signed)
Patient calling in  Patient was seen in office 12.13.22 for some sinus like symptoms & post nasal drip  Patient says she has not gotten any better & is leaving for the holiday out of town today  Says she believes it is a possible cold bc she has taken 2 covid test & they are both neg but she is having a lot of cough/congestion & mucus in her throat  Would like callback from nurse.. hoping provider will send antibiotic to help clear up symptoms  Callback [phone]

## 2021-03-22 NOTE — Telephone Encounter (Signed)
Ok I sent zpack

## 2021-03-22 NOTE — Telephone Encounter (Signed)
Patient has been notified

## 2021-03-23 ENCOUNTER — Ambulatory Visit (INDEPENDENT_AMBULATORY_CARE_PROVIDER_SITE_OTHER): Payer: Medicare Other | Admitting: Family Medicine

## 2021-03-23 ENCOUNTER — Encounter (INDEPENDENT_AMBULATORY_CARE_PROVIDER_SITE_OTHER): Payer: Self-pay

## 2021-03-23 VITALS — BP 148/71 | HR 77 | Temp 98.4°F | Resp 16 | Ht 63.5 in | Wt 177.2 lb

## 2021-03-23 DIAGNOSIS — R519 Headache, unspecified: Secondary | ICD-10-CM

## 2021-03-23 DIAGNOSIS — R051 Acute cough: Secondary | ICD-10-CM

## 2021-03-23 DIAGNOSIS — Z20822 Contact with and (suspected) exposure to covid-19: Secondary | ICD-10-CM

## 2021-03-23 LAB — POCT INFLUENZA A/B
POCT Rapid Influenza A AG: NEGATIVE
POCT Rapid Influenza B AG: NEGATIVE

## 2021-03-23 LAB — IN OFFICE COVID POCT ABBOTT ID NOW: SARS CoV 2 Overall Result: NEGATIVE

## 2021-03-23 NOTE — Patient Instructions (Signed)
Follow up with your PCP if not improving  Okay to take Tylenol 1000 mg every 6 hours as needed  Push fluids

## 2021-03-23 NOTE — Progress Notes (Signed)
[facility]  PROGRESS NOTE     Patient: Olivia Ballard   Date: 03/23/2021   MRN: [id number]       Olivia Ballard is a 65 y.o. female      HISTORY     History obtained from: Patient    Chief Complaint   Patient presents with    Headache     Headaches specially when coughing a lot because of the phlegm on the chest and when she stands up from sitting.  She is taking second session of azithromycin because of the bronchitis since 1.5 weeks ago.she was taking allegra since 1.5 week ago but not any more. She does not have any cough and breathing problem anymore after the sickness 1.5 week ago.        HPI     First day of symptoms: Approximately 1.5 to 2 weeks with cough, congestion. Saw pcp and given zpack for possible bronchitis. States was improving but cough not completely gone so given additional course of zpack. She just started the second course yesterday. Has done home tests for covid x 2 which have been negative. No fevers or chills. She has been having bilateral headaches. More with standing. No nausea, no vomiting, no visual changes, no dizziness. She has tinnitus but this is not new symptom. No head injury.   COVID Vaccine Status: Fully Vaccinated with Booster dose  FLU Vaccine Status: Yes  COVID Exposure: No  On blood thinner    Review of Systems  As above    History:    Pertinent Past Medical, Surgical, Family and Social History were reviewed.        Current Outpatient Medications:     aspirin 81 MG chewable tablet, , Disp: , Rfl:     atorvastatin (LIPITOR) 10 MG tablet, , Disp: , Rfl:     azithromycin (ZITHROMAX) 250 MG tablet, , Disp: , Rfl:     calcium carbonate 1250 mg/5 mL oral suspension, Take 2 tablets by mouth, Disp: , Rfl:     Cholecalciferol (Vitamin D) 10 MCG/ML Liquid, , Disp: , Rfl:     clopidogrel (PLAVIX) 75 mg tablet, , Disp: , Rfl:     esomeprazole (NexIUM) 40 MG capsule, , Disp: , Rfl:     ezetimibe (ZETIA) 10 MG tablet, , Disp: , Rfl:     famotidine (PEPCID) 40 MG tablet, , Disp: ,  Rfl:     metoprolol tartrate (LOPRESSOR) 25 MG tablet, , Disp: , Rfl:     metroNIDAZOLE (METROGEL) 1 % gel, , Disp: , Rfl:     Multiple Vitamins-Minerals (MULTIVITAMIN PO LIQD), , Disp: , Rfl:     Nexletol 180 MG Tab, , Disp: , Rfl:     valsartan-hydrochlorothiazide (DIOVAN-HCT) 160-12.5 MG per tablet, , Disp: , Rfl:     valsartan-hydroCHLOROthiazide (DIOVAN-HCT) 160-12.5 MG per tablet, Take by mouth, Disp: , Rfl:     clopidogrel (PLAVIX) 300 MG tablet, , Disp: , Rfl:     lansoprazole (PREVACID) 15 MG capsule, , Disp: , Rfl:     TOBRADEX ST 0.3-0.05 % SUSP, , Disp: , Rfl:     Allergies   Allergen Reactions    Sulfa Antibiotics Anaphylaxis and Nausea And Vomiting    Sulfites     Penicillins Rash       Medications and Allergies reviewed.    PHYSICAL EXAM     Vitals:    03/23/21 1424   BP: 148/71   BP Site: Left arm  Patient Position: Sitting   Cuff Size: Medium   Pulse: 77   Resp: 16   Temp: 98.4 F (36.9 C)   TempSrc: Tympanic   SpO2: 97%   Weight: 80.4 kg (177 lb 3.2 oz)   Height: 1.613 m (5' 3.5")       Physical Exam    PHYSICAL EXAM: Appears well, in no apparent distress. No conjunctival injection. Ears normal. Throat and pharynx normal. Neck supple. No adenopathy in neck. Nose is congested. Sinuses non tender. Chest is clear, without wheezes or rales. Heart is regular rate and rhythm, no murmurs noted. Skin is warm without rashes.   Neuro: CN 2-12 grossly intact, EOMi, nl gait    UCC COURSE     LABS  The following POCT tests were ordered, reviewed and discussed with the patient/family.     Results       Procedure Component Value Units Date/Time    Influenza A/B [[id number]]  (Normal) Collected: 03/23/21 1511     Updated: 03/23/21 1512     POCT QC Pass     POCT Rapid Influenza A AG Negative     POCT Rapid Influenza B AG Negative    [facility]ott ID Now SARS-COV-2 POCT [[id number]]  (Normal) Collected: 03/23/21 1511    Specimen: Nasal Swab COVID-19 Updated: 03/23/21 1512     SARS CoV 2 Overall Result Negative             There were no x-rays reviewed with this patient during the visit.    No current facility-administered medications for this visit.          PROCEDURES     Procedures      MEDICAL DECISION MAKING     History, physical and labs/studies most consistent with Viral Upper Respiratory Illness and Headache    Chart Review:  Prior PCP, Specialist and/or ED notes reviewed today: Not Applicable  Prior labs/images/studies reviewed today: Not Applicable    Differential Diagnosis: Sinusitis, Bronchitis, Pharyngitis, Pneumonia, Influenza, COVID-19 and Allergic Rhinitis      ASSESSMENT     Encounter Diagnoses   Name Primary?    Acute cough Yes    Acute intractable headache, unspecified headache type           Follow up with PCP if not improving with regards to headache. Tylenol as needed. ER precautions given.  PLAN      PLAN:   Symptomatic therapy suggested including rest and push fluids. Viral nature of illness discussed including lack of antibiotic effectiveness for diagnosis. This was discussed in detail with the patient/family. Call or return to clinic prn if these symptoms worsen or fail to improve as anticipated.      Orders Placed This Encounter   Procedures    Influenza A/B    Abbott ID Now SARS-COV-2 POCT     Requested Prescriptions      No prescriptions requested or ordered in this encounter       Discussed results and diagnosis with patient/family.  Reviewed warning signs for worsening condition, as well as, indications for follow-up with primary care physician and return to urgent care clinic.   Patient/family expressed understanding of instructions.     An After Visit Summary was given to the patient.

## 2021-04-07 ENCOUNTER — Encounter: Payer: Self-pay | Admitting: Cardiovascular Disease

## 2021-04-07 ENCOUNTER — Ambulatory Visit (INDEPENDENT_AMBULATORY_CARE_PROVIDER_SITE_OTHER): Payer: Medicare Other | Admitting: Cardiovascular Disease

## 2021-04-07 ENCOUNTER — Other Ambulatory Visit: Payer: Self-pay

## 2021-04-07 VITALS — BP 136/78 | HR 64 | Ht 63.5 in | Wt 179.0 lb

## 2021-04-07 DIAGNOSIS — I214 Non-ST elevation (NSTEMI) myocardial infarction: Secondary | ICD-10-CM | POA: Diagnosis not present

## 2021-04-07 DIAGNOSIS — I2510 Atherosclerotic heart disease of native coronary artery without angina pectoris: Secondary | ICD-10-CM | POA: Diagnosis not present

## 2021-04-07 DIAGNOSIS — I10 Essential (primary) hypertension: Secondary | ICD-10-CM | POA: Diagnosis not present

## 2021-04-07 DIAGNOSIS — E782 Mixed hyperlipidemia: Secondary | ICD-10-CM | POA: Diagnosis not present

## 2021-04-07 MED ORDER — NITROGLYCERIN 0.4 MG SL SUBL: 0.4000 mg | SUBLINGUAL_TABLET | SUBLINGUAL | 6 refills | Status: DC | PRN

## 2021-04-07 NOTE — Assessment & Plan Note (Signed)
History of essential hypertension a blood pressure measured today 136/78.  She is on metoprolol, valsartan and hydrochlorothiazide.

## 2021-04-07 NOTE — Assessment & Plan Note (Signed)
History of hyperlipidemia on low-dose atorvastatin, Nexletol and Zetia with lipid profile performed 02/08/2021 revealing total cholesterol 117, LDL 51 and HDL of 42.  She is followed by Dr. Debara Pickett in the lipid clinic.

## 2021-04-07 NOTE — Patient Instructions (Signed)

## 2021-04-07 NOTE — Progress Notes (Signed)
04/07/2021 Heather Merritt   12/10/1955  [id number]  Primary Physician Biagio Borg, MD Primary Cardiologist: Lorretta Harp MD Heather Merritt, [place]  HPI:  Heather Merritt is a 66 y.o.  moderately overweight divorced Caucasian female no children who works as a [employer] architect.  I last saw her in the office 03/01/2020.  I first met her during her hospitalization in July 2020 for non-STEMI.  Her risk factors include hyperlipidemia intolerant to statin therapy, treated hypertension and family history with a mother who had PCI at age 72.  She is not a smoker.  She had chest pain on 10/03/2018 with positive enzymes.  Coronary CTA showed significant mid to distal dominant RCA stenosis.  I performed radial diagnostic cath on her 10/06/2018 revealing high-grade mid RCA stenosis.  I deployed a synergy 2.5 x 20 mm long drug-eluting stent post dilating with a 2.75 mm noncompliant balloon up to 2.8 mm.  She did have an ostial diagonal branch stenosis with normal LV function.  She was discharged home on 10/07/2018 on aspirin and Brilinta.  Unfortunately she could not tolerate Brilinta and was transitioned to clopidogrel.  She is also apparently statin intolerant and is seeing Dr. Debara Pickett for treatment of hyperlipidemia.  She is currently on low-dose atorvastatin and Zetia .  She apparently was tried on Repatha but was allergic to it.   Since I saw her a year ago she continues to do well.  She gets occasional noncardiac chest pain.  I did refer her to Dr. Debara Pickett who began her on Nexletol and Zetia resulting in an excellent lipid profile along with low-dose atorvastatin.     Current Meds  Medication Sig   aspirin 81 MG tablet Take 81 mg by mouth daily.   atorvastatin (LIPITOR) 10 MG tablet Take 1 tablet (10 mg total) by mouth daily.   Bempedoic Acid (NEXLETOL) 180 MG TABS Take 1 Dose by mouth daily.   Calcium Citrate (CITRACAL PO) Take 2 tablets by mouth daily.    Cholecalciferol (VITAMIN D) 2000  UNITS CAPS Take 2,000 Units by mouth daily.   clopidogrel (PLAVIX) 75 MG tablet TAKE 1 TABLET EVERY DAY   esomeprazole (NEXIUM) 40 MG capsule TAKE 1 CAPSULE EVERY DAY   ezetimibe (ZETIA) 10 MG tablet TAKE 1 TABLET EVERY DAY   famotidine (PEPCID) 40 MG tablet Take 1 tablet (40 mg total) by mouth daily.   metoprolol tartrate (LOPRESSOR) 25 MG tablet TAKE 1/2 TABLET TWICE DAILY   metroNIDAZOLE (METROGEL) 0.75 % gel Apply 1 application topically at bedtime.   Multiple Vitamin (MULITIVITAMIN WITH MINERALS) TABS Take 1 tablet by mouth daily.   pimecrolimus (ELIDEL) 1 % cream Apply 1 application topically every morning.   valsartan-hydrochlorothiazide (DIOVAN-HCT) 160-12.5 MG tablet TAKE 1 TABLET EVERY DAY   [DISCONTINUED] nitroGLYCERIN (NITROSTAT) 0.4 MG SL tablet Place 1 tablet (0.4 mg total) under the tongue every 5 (five) minutes x 3 doses as needed for chest pain.     Allergies  Allergen Reactions   Penicillins Hives, Itching and Rash    Did it involve swelling of the face/tongue/throat, SOB, or low BP? Unknown Did it involve sudden or severe rash/hives, skin peeling, or any reaction on the inside of your mouth or nose? Unknown Did you need to seek medical attention at a hospital or doctor's office? Unknown When did it last happen?      pt cant recall If all above answers are NO, may proceed with cephalosporin  use.    Rosuvastatin Other (See Comments)    REACTION: muscle weakness   Simvastatin Other (See Comments)    REACTION: muscle weakness   Statins Other (See Comments)    Myalgias and weakness   Sulfonamide Derivatives Other (See Comments)    Nausea, vomiting, abd pain   Repatha [Evolocumab] Swelling    Swelling, itchy eyes, mouth swelling, red/puffy/hot face   Brilinta [Ticagrelor] Rash    Itchy rash to torso   Doxycycline Hyclate Itching and Rash   Sulfa Antibiotics Other (See Comments)    Nausea, vomiting, abd pain    Social History   Socioeconomic History   Marital  status: Divorced    Spouse name: Not on file   Number of children: 0   Years of education: 12   Highest education level: High school graduate  Occupational History   Not on file  Tobacco Use   Smoking status: Former    Types: Cigarettes   Smokeless tobacco: Never   Tobacco comments:    quit in 31yr ago  Vaping Use   Vaping Use: Never used  Substance and Sexual Activity   Alcohol use: Not Currently    Alcohol/week: 0.0 standard drinks    Comment: rare   Drug use: No   Sexual activity: Yes    Birth control/protection: Post-menopausal  Other Topics Concern   Not on file  Social History Narrative   Not on file   Social Determinants of Health   Financial Resource Strain: Not on file  Food Insecurity: Not on file  Transportation Needs: Not on file  Physical Activity: Not on file  Stress: Not on file  Social Connections: Not on file  Intimate Partner Violence: Not on file     Review of Systems: General: negative for chills, fever, night sweats or weight changes.  Cardiovascular: negative for chest pain, dyspnea on exertion, edema, orthopnea, palpitations, paroxysmal nocturnal dyspnea or shortness of breath Dermatological: negative for rash Respiratory: negative for cough or wheezing Urologic: negative for hematuria Abdominal: negative for nausea, vomiting, diarrhea, bright red blood per rectum, melena, or hematemesis Neurologic: negative for visual changes, syncope, or dizziness All other systems reviewed and are otherwise negative except as noted above.    Blood pressure 136/78, pulse 64, height 5' 3.5" (1.613 m), weight 179 lb (81.2 kg).  General appearance: alert and no distress Neck: no adenopathy, no carotid bruit, no JVD, supple, symmetrical, trachea midline, and thyroid not enlarged, symmetric, no tenderness/mass/nodules Lungs: clear to auscultation bilaterally Heart: regular rate and rhythm, S1, S2 normal, no murmur, click, rub or gallop Extremities:  extremities normal, atraumatic, no cyanosis or edema Pulses: 2+ and symmetric Skin: Skin color, texture, turgor normal. No rashes or lesions Neurologic: Grossly normal  EKG sinus rhythm at 64 with septal Q waves.  I personally reviewed this EKG.  ASSESSMENT AND PLAN:   Essential (primary) hypertension History of essential hypertension a blood pressure measured today 136/78.  She is on metoprolol, valsartan and hydrochlorothiazide.  HLD (hyperlipidemia) History of hyperlipidemia on low-dose atorvastatin, Nexletol and Zetia with lipid profile performed 02/08/2021 revealing total cholesterol 117, LDL 51 and HDL of 42.  She is followed by Dr. HDebara Pickettin the lipid clinic.  NSTEMI (non-ST elevated myocardial infarction) ([facility] History of CAD status post non-STEMI July 2020 with coronary CTA showing a mid dominant RCA stenosis.  I catheterized her radially 10/06/2018 revealing a high-grade mid RCA stenosis which I stented using a 2.5 x 20 mm long Synergy drug-eluting stent postdilated  to 2.8 mm.  She also had an ostial diagonal branch stenosis and normal LV function.  Unfortunately, she could not tolerate Brilinta and was transitioned to clopidogrel which she remains on.  She gets occasional "twinges" not similar to her prior symptoms.     Lorretta Harp MD FACP,FACC,FAHA, [facility] 04/07/2021 9:33 AM

## 2021-04-07 NOTE — Assessment & Plan Note (Signed)
History of CAD status post non-STEMI July 2020 with coronary CTA showing a mid dominant RCA stenosis.  I catheterized her radially 10/06/2018 revealing a high-grade mid RCA stenosis which I stented using a 2.5 x 20 mm long Synergy drug-eluting stent postdilated to 2.8 mm.  She also had an ostial diagonal branch stenosis and normal LV function.  Unfortunately, she could not tolerate Brilinta and was transitioned to clopidogrel which she remains on.  She gets occasional "twinges" not similar to her prior symptoms.

## 2021-04-07 NOTE — Addendum Note (Signed)
Addended by: Beatrix Fetters on: 04/07/2021 09:38 AM   Modules accepted: Orders

## 2021-07-13 ENCOUNTER — Telehealth: Payer: Self-pay | Admitting: Cardiovascular Disease

## 2021-07-13 NOTE — Telephone Encounter (Signed)
Pt c/o BP issue: STAT if pt c/o blurred vision, one-sided weakness or slurred speech ? ?1. What are your last 5 BP readings?  ?Mon -166/93  ?Tues- 161/90 ?Wed - 155/82 ?Today - 176/93 7:30 am 8 pm 153/87  12 pm 159/86 ? ?2. Are you having any other symptoms (ex. Dizziness, headache, blurred vision, passed out)? A little light headed  ? ?3. What is your BP issue?  Pt stated this month her bp has been higher.   ? ?Best number [phone]  ?

## 2021-07-13 NOTE — Telephone Encounter (Signed)
Spoke with patient, she admits to having more stress in past few weeks, will be headed to [place] to help cousin care for terminal breast cancer spouse.  Has been eating out more recently as well.  Realizes that she has increased sodium consumption. ? ?Advised that she should just continue with current medications for now.  When she gets back from trip she can continue to focus on healthy eating.  She was also encouraged to get out and walk daily, her pressure is not too high for this.   ? ?She will continue to monitor and call back with any concerns in the next month.  ?

## 2021-07-13 NOTE — Telephone Encounter (Signed)
-  Pt called stating over the past 2 weeks she's noticed an increase in her BP (readings listed below). ?-Pt asymptomatic but report BP usually run in the 130's ? ?Mon -166/93  ?Tues- 161/90 ?Wed - 155/82 ?Today - 176/93 7:30 am 156/94 12:15 pm  ? ?Will forward to Pharm D for recommendations.  ?

## 2021-07-14 ENCOUNTER — Emergency Department (HOSPITAL_BASED_OUTPATIENT_CLINIC_OR_DEPARTMENT_OTHER)
Admission: EM | Admit: 2021-07-14 | Discharge: 2021-07-14 | Disposition: A | Discharge status: Home / Self Care | Payer: Medicare Other | Attending: Emergency Medicine | Admitting: Emergency Medicine

## 2021-07-14 ENCOUNTER — Encounter (HOSPITAL_BASED_OUTPATIENT_CLINIC_OR_DEPARTMENT_OTHER): Payer: Self-pay | Admitting: *Deleted

## 2021-07-14 ENCOUNTER — Telehealth: Payer: Self-pay

## 2021-07-14 ENCOUNTER — Other Ambulatory Visit: Payer: Self-pay

## 2021-07-14 ENCOUNTER — Telehealth: Payer: Self-pay | Admitting: Cardiovascular Disease

## 2021-07-14 DIAGNOSIS — Z79899 Other long term (current) drug therapy: Secondary | ICD-10-CM | POA: Insufficient documentation

## 2021-07-14 DIAGNOSIS — I10 Essential (primary) hypertension: Secondary | ICD-10-CM | POA: Diagnosis not present

## 2021-07-14 DIAGNOSIS — Z7982 Long term (current) use of aspirin: Secondary | ICD-10-CM | POA: Diagnosis not present

## 2021-07-14 DIAGNOSIS — I2510 Atherosclerotic heart disease of native coronary artery without angina pectoris: Secondary | ICD-10-CM | POA: Diagnosis not present

## 2021-07-14 LAB — TROPONIN I (HIGH SENSITIVITY)
Troponin I (High Sensitivity): 3 ng/L (ref <18)
Troponin I (High Sensitivity): 3 ng/L (ref <18)

## 2021-07-14 LAB — BASIC METABOLIC PANEL
Anion gap: 7 (ref 5–15)
BUN: 23 mg/dL (ref 8–23)
CO2: 30 mmol/L (ref 22–32)
Calcium: 9.5 mg/dL (ref 8.9–10.3)
Chloride: 101 mmol/L (ref 98–111)
Creatinine, Ser: 0.82 mg/dL (ref 0.44–1.00)
GFR, Estimated: >60 mL/min (ref >60)
Glucose, Bld: 95 mg/dL (ref 70–99)
Potassium: 4 mmol/L (ref 3.5–5.1)
Sodium: 138 mmol/L (ref 135–145)

## 2021-07-14 LAB — CBC
HCT: 38.7 % (ref 36.0–46.0)
Hemoglobin: 13.2 g/dL (ref 12.0–15.0)
MCH: 30.8 pg (ref 26.0–34.0)
MCHC: 34.1 g/dL (ref 30.0–36.0)
MCV: 90.4 fL (ref 80.0–100.0)
Platelets: 276 10*3/uL (ref 150–400)
RBC: 4.28 MIL/uL (ref 3.87–5.11)
RDW: 12.2 % (ref 11.5–15.5)
WBC: 3.8 10*3/uL — ABNORMAL LOW (ref 4.0–10.5)
nRBC: 0 % (ref 0.0–0.2)

## 2021-07-14 NOTE — ED Triage Notes (Signed)
Pt states she has been concerned about her BP and not feeling well this week. States bp has been 160s/90s. She has been in contact with her cardiologist but hasn't been seen. States they want her to monitor BP for a few weeks but she didn't want to wait to be seen ?

## 2021-07-14 NOTE — Telephone Encounter (Signed)
Pt c/o BP issue: STAT if pt c/o blurred vision, one-sided weakness or slurred speech ? ?1. What are your last 5 BP readings? 160/91 ? ?2. Are you having any other symptoms (ex. Dizziness, headache, blurred vision, passed out)? No ? ?3. What is your BP issue? Pt states that she feels lightheaded and body feels heavy ? ? ?

## 2021-07-14 NOTE — ED Provider Notes (Signed)
?[facility] EMERGENC[facility] DEPARTMENT ?Provider Note ? ? ?CSN: [id number] ?Arrival date & time: 07/14/21  1738 ? ?  ? ?History ? ?Chief Complaint  ?Patient presents with  ? Hypertension  ? ? ?Heather Merritt is a 66 y.o. female. ? ?Patient is a 66 year old female with a history of hypertension, hyperlipidemia, IBS, anxiety, CAD status post stent placement who is on 2 different blood pressure medications at this time presenting because all week long she has had elevated blood pressures at 160/90 despite taking her medication.  She does report she has been eating out a lot more this week and knows that she has had more sodium than she should.  However she is just concerned because the blood pressure seem to be continually elevated.  She was out today and noticed around 2:00 after she got back from lunch she felt a heavy sensation in her shoulders and just felt generally fatigued.  This lasted for about 30 minutes to an hour and did improve but her blood pressure was elevated at that time as well.  She had spoke with her cardiology office but Dr. Alvester Chou was on vacation.  They recommended that she continue to just keep monitoring her blood pressure but she got worried and will just wanted to have it checked up.  She felt like she was breathing heavier today during this episode but denies any shortness of breath.  She has had no chest pain.  She has been eating and drinking normally and has had no nausea or vomiting.  She denies any abdominal pain, cough congestion or recent URI symptoms.  She did take Benadryl once this week for allergies but it was several days ago and she has not taken anything consistently. ? ?The history is provided by the patient.  ?Hypertension ? ? ?  ? ?Home Medications ?Prior to Admission medications   ?Medication Sig Start Date End Date Taking? Authorizing Provider  ?aspirin 81 MG tablet Take 81 mg by mouth daily.    [provider]  ?atorvastatin (LIPITOR) 10 MG tablet Take 1  tablet (10 mg total) by mouth daily. 02/13/21   Hilty, Nadean Corwin, MD  ?Bempedoic Acid (NEXLETOL) 180 MG TABS Take 1 Dose by mouth daily. 10/06/20   Pixie Casino, MD  ?Calcium Citrate (CITRACAL PO) Take 2 tablets by mouth daily.     [provider]  ?Cholecalciferol (VITAMIN D) 2000 UNITS CAPS Take 2,000 Units by mouth daily.    [provider]  ?clopidogrel (PLAVIX) 75 MG tablet TAKE 1 TABLET EVERY DAY 03/02/21   Biagio Borg, MD  ?esomeprazole (NEXIUM) 40 MG capsule TAKE 1 CAPSULE EVERY DAY 09/13/20   Biagio Borg, MD  ?ezetimibe (ZETIA) 10 MG tablet TAKE 1 TABLET EVERY DAY 12/07/20   Hilty, Nadean Corwin, MD  ?famotidine (PEPCID) 40 MG tablet Take 1 tablet (40 mg total) by mouth daily. 05/12/20   Esterwood, Amy S, PA-C  ?metoprolol tartrate (LOPRESSOR) 25 MG tablet TAKE 1/2 TABLET TWICE DAILY 03/02/21   Biagio Borg, MD  ?metroNIDAZOLE (METROGEL) 0.75 % gel Apply 1 application topically at bedtime. 06/11/17   Biagio Borg, MD  ?Multiple Vitamin (MULITIVITAMIN WITH MINERALS) TABS Take 1 tablet by mouth daily.    [provider]  ?nitroGLYCERIN (NITROSTAT) 0.4 MG SL tablet Place 1 tablet (0.4 mg total) under the tongue every 5 (five) minutes x 3 doses as needed for chest pain. 04/07/21   Lorretta Harp, MD  ?pimecrolimus (ELIDEL) 1 %  cream Apply 1 application topically every morning. 06/11/17   Biagio Borg, MD  ?valsartan-hydrochlorothiazide (DIOVAN-HCT) 160-12.5 MG tablet TAKE 1 TABLET EVERY DAY 03/02/21   Biagio Borg, MD  ?   ? ?Allergies    ?Penicillins, Rosuvastatin, Simvastatin, Statins, Sulfonamide derivatives, Repatha [evolocumab], Brilinta [ticagrelor], Doxycycline hyclate, and Sulfa antibiotics   ? ?Review of Systems   ?Review of Systems ? ?Physical Exam ?Updated Vital Signs ?BP (!) 141/77   Pulse 64   Temp 98.1 ?F (36.7 ?C) (Oral)   Resp 16   Ht 5' 3.5" (1.613 m)   Wt 79.4 kg   SpO2 98%   BMI 30.51 kg/m?  ?Physical Exam ?Vitals and nursing note reviewed.  ?Constitutional:    ?   General: She is not in acute distress. ?   Appearance: Normal appearance. She is well-developed.  ?HENT:  ?   Head: Normocephalic and atraumatic.  ?Eyes:  ?   Pupils: Pupils are equal, round, and reactive to light.  ?Cardiovascular:  ?   Rate and Rhythm: Normal rate and regular rhythm.  ?   Heart sounds: Normal heart sounds. No murmur heard. ?  No friction rub.  ?Pulmonary:  ?   Effort: Pulmonary effort is normal.  ?   Breath sounds: Normal breath sounds. No wheezing or rales.  ?Abdominal:  ?   General: Bowel sounds are normal. There is no distension.  ?   Palpations: Abdomen is soft.  ?   Tenderness: There is no abdominal tenderness. There is no guarding or rebound.  ?Musculoskeletal:     ?   General: No tenderness. Normal range of motion.  ?   Comments: No edema  ?Skin: ?   General: Skin is warm and dry.  ?   Findings: No rash.  ?Neurological:  ?   Mental Status: She is alert and oriented to person, place, and time.  ?   Cranial Nerves: No cranial nerve deficit.  ?Psychiatric:     ?   Behavior: Behavior normal.  ? ? ?ED Results / Procedures / Treatments   ?Labs ?(all labs ordered are listed, but only abnormal results are displayed) ?Labs Reviewed  ?CBC - Abnormal; Notable for the following components:  ?    Result Value  ? WBC 3.8 (*)   ? All other components within normal limits  ?BASIC METABOLIC PANEL  ?TROPONIN I (HIGH SENSITIVITY)  ?TROPONIN I (HIGH SENSITIVITY)  ? ? ?EKG ?EKG Interpretation ? ?Date/Time:  Friday July 14 2021 18:14:37 EDT ?Ventricular Rate:  80 ?PR Interval:  146 ?QRS Duration: 80 ?QT Interval:  396 ?QTC Calculation: 456 ?R Axis:   47 ?Text Interpretation: Normal sinus rhythm Low voltage QRS Cannot rule out Anterior infarct , age undetermined No significant change since last tracing When compared with ECG of 11-Jan-2020 17:55, PREVIOUS ECG IS PRESENT Confirmed by Blanchie Dessert [phone]) on 07/14/2021 7:34:02 PM ? ?Radiology ?No results found. ? ?Procedures ?Procedures  ? ? ?Medications  Ordered in ED ?Medications - No data to display ? ?ED Course/ Medical Decision Making/ A&P ?  ?                        ?Medical Decision Making ?Amount and/or Complexity of Data Reviewed ?External Data Reviewed: notes. ?Labs: ordered. Decision-making details documented in ED Course. ?ECG/medicine tests: ordered and independent interpretation performed. Decision-making details documented in ED Course. ? ?Risk ?Prescription drug management. ? ? ?Patient is a 66 year old female with multiple medical problems presenting  today with concerns for elevated blood pressure.  Patient is well-appearing on exam.  She has no chest pain but given the episode she had earlier today we will ensure no evidence of NSTEMI.  Patient does have risk factors but again did not have significant shortness of breath chest pain nausea vomiting or exertional symptoms earlier today.  The episode she talked about was approximately 6 hours prior to arrival.  She has no symptoms suggestive of pneumonia, PE, pericarditis, acute GI issues.  Patient has been eating out a lot and most likely elevated sodium in her diet is the result of her elevated blood pressure.  Patient is compliant with her medications.  I independently interpreted patient's labs and EKG today which shows no acute changes.  CBC is unchanged, CMP is within normal limits and patient's troponin is 3.  Again this is 6 hours after the episode she had and feel that this adequately rules her out for NSTEMI.  Patient's blood pressure here on repeat evaluation was 141/77.  She did take her evening time dose of metoprolol.  Encouraged her to continue to monitor the blood pressure, stick with a low-sodium diet and speak with cardiology office next week for recheck.  She was given return precautions.  Did discuss with patient if her blood pressure remained elevated and she was concerned she could take an additional half of her metoprolol.  No indication for admission today.  Patient has no  social determinants affecting her discharge. ? ? ? ? ? ? ? ?Final Clinical Impression(s) / ED Diagnoses ?Final diagnoses:  ?Hypertension, unspecified type  ? ? ?Rx / DC Orders ?ED Discharge Orders   ? ? None  ? ?  ? ?

## 2021-07-14 NOTE — Telephone Encounter (Signed)
Patient reports that over the past 4 days, she has felt light-headed with BP ranging from 154/74 to 160/91. She spoke with PharmD yesterday. I will upload that note to patient's MyChart per her request. Advised patient to check BP 2 hours after BP medication. Patient denies nausea/vomiting. Denies chest pain, pressure, tightness. She does report a heaviness in her body, especially on top of shoulders. Reviewed how  and when to use NTG. Home COVID test was negative. States she has been eating out for the past week and has not limited sodium intake. Recommended for her to stay hydrated and to rise slowly from bed or sitting and to wait at least 20 seconds before walking. She plans to drive to [place] for family, but is scared to drive. She asked if she should be seen before her trip this weekend. I recommended that if she wants to be seen, then she can go to an urgent care or the ED. She stated she wants to go but doesn't want to drive. I recommended she have someone drive her or to call EMS/911. ?

## 2021-07-14 NOTE — Discharge Instructions (Addendum)
Continue to stick with a low-sodium diet.  Continue to take your blood pressure medications as prescribed but if it does run high and you feel like you need something to bring it down you can take an additional half of your metoprolol.  All of your labs today look normal and your EKG was normal. ?

## 2021-07-17 NOTE — Progress Notes (Signed)
?Cardiology Office Note:   ? ?Date:  07/18/2021  ? ?ID:  Heather Merritt, DOB 04/11/1955, MRN [id number] ? ?PCP:  Biagio Borg, MD ?  ?[facility] Providers ?Cardiologist:  Quay Burow, MD { ?Referring MD: Biagio Borg, MD  ? ?Chief Complaint  ?Patient presents with  ? Follow-up  ?  In ED recently for elevated blood pressure   ? Hypertension  ? ? ?History of Present Illness:   ? ?Heather Merritt is a 66 y.o. female with a hx of HTN, HLD intolerant to statins, IBS, anxiety, CAD s/p PCI. She was first evaluated by cardiology for chest pain in 10/2018. Coronary CTA with RCA stenosis leading to [facility] 10/06/18 confirming high grade RCA stenosis treated with DES. She did not tolerate brilinta and was transitioned to plavix. She is intolerant to statins and was allergic to repatha. She is maintained on low dose lipitor and zetia. She sees Dr. Debara Pickett in lipid clinic and is now on nexletol.  ? ?She was seen in the ER 07/14/21 with hypertension in the setting of increased restaurant food. She ruled out for NSTEMI and was discharged with cardiology follow up.  ? ?She presents today for follow up. BP today is 132/74. She brings a BP log that reveals uncontrolled BP in the 140-160s - does not appear that she is having a good response to her blood pressure medications. Prior to Feb 2023, she was in the 130s. She had not bee evaluated for RAS. She also reports dizziness 30-60 min after awakening. She is dizzy prior to any medications. She denies headache and blurry vision.  ? ?She has been eating some restaurant food and knows her BP goes up with extra salt/sodium. She is understandably frustrated because she has improved her diet ? ?She appears to be on ASA, plavix, valsartan-HCTZ, 12.5 mg metoprolol, 10 mg lipitor, zetia, and nexletol.  ? ?Orthostatic vitals taken in the office today shows: ?Lying 130/79, 62 bpm ?Sitting 126/79, 65 bpm ?Standing 118/78, 67 bpm ?Standing 3 min 128/82, 70 bpm ? ?Her blood pressure log includes  readings at least twice a day, sometimes more.  She takes a reading in the morning if she becomes dizzy.  I suspect that multiple readings are increasing anxiety and actually increasing her blood pressure readings.  Her blood pressure here today is well controlled.  Interestingly, her blood pressure log from this morning was in the 170s even after medication. ? ? ?Past Medical History:  ?Diagnosis Date  ? Allergy   ? SEASONAL  ? ANXIETY 10/28/2007  ? Arthritis   ? CAD (coronary artery disease), native coronary artery   ? a. Cath 10/06/2018 - S/p DES to [facility]; medical therapy for high grade ostial/pro 2nd digonal   ? Cataracts, bilateral   ? immature  ? Chronic back pain   ? Coronary artery disease   ? DIVERTICULOSIS, COLON 10/28/2007  ? Dry skin   ? red patch on right knee area  ? GERD 10/28/2007  ? takes Prevacid daily  ? H/O hiatal hernia   ? HLD (hyperlipidemia) 12/12/2017  ? Hx of colonic polyps   ? HYPERLIPIDEMIA 10/28/2007  ? unable to take meds d/t allergies  ? HYPERTENSION 10/28/2007  ? takes Diovan daily  ? Internal hemorrhoids   ? Irritable bowel syndrome (IBS)   ? Joint pain   ? Joint swelling   ? LIVER FUNCTION TESTS, ABNORMAL, HX OF 10/29/2007  ? Lumbar disc disease   ? Migraine   ?  Myocardial infarction [facility])   ? Obesity   ? OSTEOPENIA 10/29/2007  ? Osteoporosis   ? Presbyacusis 01/04/2010  ? Rosacea   ? Seasonal allergies   ? takes Clarinex daily  ? Vertigo   ? hx of  ? Vitamin D deficiency   ? takes Vit D daily  ? ? ?Past Surgical History:  ?Procedure Laterality Date  ? ANTERIOR CERVICAL DECOMP/DISCECTOMY FUSION  08/02/2011  ? Procedure: ANTERIOR CERVICAL DECOMPRESSION/DISCECTOMY FUSION 3 LEVELS;  Surgeon: Elaina Hoops, MD;  Location: [facility];  Service: Neurosurgery;  Laterality: N/A;  Cervical Three-Four, Cervical Four-Five, Cervical Five-Six  Anterior Cervical Decompression Fusion ?  ? CARDIAC CATHETERIZATION    ? COLONOSCOPY  2001/2013  ? CORONARY STENT INTERVENTION N/A 10/06/2018  ? Procedure: CORONARY  STENT INTERVENTION;  Surgeon: Lorretta Harp, MD;  Location: [facility];  Service: Cardiovascular;  Laterality: N/A;  ? ESOPHAGOGASTRODUODENOSCOPY    ? LEFT HEART CATH AND CORONARY ANGIOGRAPHY N/A 10/06/2018  ? Procedure: LEFT HEART CATH AND CORONARY ANGIOGRAPHY;  Surgeon: Lorretta Harp, MD;  Location: [facility];  Service: Cardiovascular;  Laterality: N/A;  ? POLYPECTOMY    ? TONSILLECTOMY    ? TONSILLECTOMY  at age 34  ? ? ?Current Medications: ?Current Meds  ?Medication Sig  ? amLODipine (NORVASC) 2.5 MG tablet Take 1 tablet (2.5 mg total) by mouth daily.  ? aspirin 81 MG tablet Take 81 mg by mouth daily.  ? atorvastatin (LIPITOR) 10 MG tablet Take 1 tablet (10 mg total) by mouth daily.  ? Bempedoic Acid (NEXLETOL) 180 MG TABS Take 1 Dose by mouth daily.  ? Calcium Citrate (CITRACAL PO) Take 2 tablets by mouth daily.   ? Cholecalciferol (VITAMIN D) 2000 UNITS CAPS Take 2,000 Units by mouth daily.  ? clopidogrel (PLAVIX) 75 MG tablet TAKE 1 TABLET EVERY DAY  ? esomeprazole (NEXIUM) 40 MG capsule TAKE 1 CAPSULE EVERY DAY  ? ezetimibe (ZETIA) 10 MG tablet TAKE 1 TABLET EVERY DAY  ? famotidine (PEPCID) 40 MG tablet Take 1 tablet (40 mg total) by mouth daily.  ? metoprolol tartrate (LOPRESSOR) 25 MG tablet TAKE 1/2 TABLET TWICE DAILY  ? metroNIDAZOLE (METROGEL) 0.75 % gel Apply 1 application topically at bedtime.  ? Multiple Vitamin (MULITIVITAMIN WITH MINERALS) TABS Take 1 tablet by mouth daily.  ? nitroGLYCERIN (NITROSTAT) 0.4 MG SL tablet Place 1 tablet (0.4 mg total) under the tongue every 5 (five) minutes x 3 doses as needed for chest pain.  ? pimecrolimus (ELIDEL) 1 % cream Apply 1 application topically every morning.  ? valsartan-hydrochlorothiazide (DIOVAN-HCT) 160-12.5 MG tablet TAKE 1 TABLET EVERY DAY  ?  ? ?Allergies:   Penicillins, Rosuvastatin, Simvastatin, Statins, Sulfonamide derivatives, Repatha [evolocumab], Brilinta [ticagrelor], Doxycycline hyclate, and Sulfa antibiotics   ? ?Social History  ? ?Socioeconomic History  ? Marital status: Divorced  ?  Spouse name: Not on file  ? Number of children: 0  ? Years of education: 69  ? Highest education level: High school graduate  ?Occupational History  ? Not on file  ?Tobacco Use  ? Smoking status: Former  ?  Types: Cigarettes  ? Smokeless tobacco: Never  ? Tobacco comments:  ?  quit in 70yr ago  ?Vaping Use  ? Vaping Use: Never used  ?Substance and Sexual Activity  ? Alcohol use: Not Currently  ?  Alcohol/week: 0.0 standard drinks  ?  Comment: rare  ? Drug use: No  ? Sexual activity: Yes  ?  Birth control/protection: Post-menopausal  ?Other Topics Concern  ? Not on file  ?Social History Narrative  ? Not on file  ? ?Social Determinants of Health  ? ?Financial Resource Strain: Not on file  ?Food Insecurity: Not on file  ?Transportation Needs: Not on file  ?Physical Activity: Not on file  ?Stress: Not on file  ?Social Connections: Not on file  ?  ? ?Family History: ?The patient's family history includes Atrial fibrillation in her father and mother; Diabetes in her maternal grandmother; Heart disease in her mother; Hyperlipidemia in her mother; Hypothyroidism in her maternal grandmother and sister; Stroke in her mother. There is no history of Anesthesia problems, Hypotension, Malignant hyperthermia, Pseudochol deficiency, Colon cancer, Stomach cancer, Rectal cancer, or Esophageal cancer. ? ?ROS:   ?Please see the history of present illness.    ? All other systems reviewed and are negative. ? ?EKGs/Labs/Other Studies Reviewed:   ? ?The following studies were reviewed today: ? ?Left heart cath 10/06/2018: ?2nd Diag lesion is 90% stenosed. ?Prox RCA to Mid RCA lesion is 95% stenosed. ?Mid RCA lesion is 30% stenosed. ?A drug-eluting stent was successfully placed using a STENT SYNERGY DES 2.5X20. ?Post intervention, there is a 0% residual stenosis. ?The left ventricular systolic function is normal. ?LV end diastolic pressure is normal. ?The left  ventricular ejection fraction is 55-65% by visual estimate. ? ? ?Echo 10/06/18: ?1. The left ventricle has normal systolic function, with an ejection  ?fraction of 55-60%. The cavity size was normal. Left ventricular diast

## 2021-07-17 NOTE — Telephone Encounter (Signed)
Agree with response.  BP and BP meds not likely to be cause of dizziness.  She should have this evaluated by MD  ?

## 2021-07-18 ENCOUNTER — Telehealth: Payer: Self-pay | Admitting: Physician Assistant

## 2021-07-18 ENCOUNTER — Other Ambulatory Visit: Payer: Self-pay

## 2021-07-18 ENCOUNTER — Encounter: Payer: Self-pay | Admitting: Physician Assistant

## 2021-07-18 ENCOUNTER — Ambulatory Visit (INDEPENDENT_AMBULATORY_CARE_PROVIDER_SITE_OTHER): Payer: Medicare Other | Admitting: Physician Assistant

## 2021-07-18 VITALS — BP 132/74 | HR 74 | Ht 63.5 in | Wt 173.2 lb

## 2021-07-18 DIAGNOSIS — I10 Essential (primary) hypertension: Secondary | ICD-10-CM

## 2021-07-18 DIAGNOSIS — E782 Mixed hyperlipidemia: Secondary | ICD-10-CM | POA: Diagnosis not present

## 2021-07-18 DIAGNOSIS — R42 Dizziness and giddiness: Secondary | ICD-10-CM

## 2021-07-18 DIAGNOSIS — I2510 Atherosclerotic heart disease of native coronary artery without angina pectoris: Secondary | ICD-10-CM

## 2021-07-18 MED ORDER — AMLODIPINE BESYLATE 2.5 MG PO TABS: 2.5000 mg | ORAL_TABLET | Freq: Every day | ORAL | 3 refills | Status: DC

## 2021-07-18 NOTE — Telephone Encounter (Signed)
Called pt to let her know her prescription has been sent to [facility]. She asked me to cancel the order sent to [facility]. I called and cancelled the order sent there, poke with Anderson Malta, pharm tech. ?

## 2021-07-18 NOTE — Telephone Encounter (Signed)
Pt c/o medication issue: ? ?1. Name of Medication: amLODipine (NORVASC) 2.5 MG tablet ? ?2. How are you currently taking this medication (dosage and times per day)? Patient has not started yet  ? ?3. Are you having a reaction (difficulty breathing--STAT)?  ? ?4. What is your medication issue? Patient [facility] NOT the [facility] order. She is leaving to go out of town. She states she specifically told the office to send it to the local pharmacy during her visit ?

## 2021-07-18 NOTE — Telephone Encounter (Signed)
Refill sent to pharmacy.   

## 2021-07-18 NOTE — Patient Instructions (Signed)
Medication Instructions:  ? ?START AMLODIPINE 2.5 MG ONCE DAILY ? ?FOLLOW BLOOD PRESSURE AND IF AFTER 2 WEEKS IT IS GREATER THAN 140-INCREASE AMLODIPINE TO 5 MG ONCE DAILY= 2 OF THE 2.5 MG TABLET ONCE DAILY ?*If you need a refill on your cardiac medications before your next appointment, please call your pharmacy* ? ? ?Testing/Procedures: ? ?Your physician has requested that you have a renal artery duplex. During this test, an ultrasound is used to evaluate blood flow to the kidneys. Allow one hour for this exam. Do not eat after midnight the day before and avoid carbonated beverages. Take your medications as you usually do. [facility] ? ? ?Follow-Up: ?At [facility], you and your health needs are our priority.  As part of our continuing mission to provide you with exceptional heart care, we have created designated Provider Care Teams.  These Care Teams include your primary Cardiologist (physician) and Advanced Practice Providers (APPs -  Physician Assistants and Nurse Practitioners) who all work together to provide you with the care you need, when you need it. ? ?We recommend signing up for the patient portal called "MyChart".  Sign up information is provided on this After Visit Summary.  MyChart is used to connect with patients for Virtual Visits (Telemedicine).  Patients are able to view lab/test results, encounter notes, upcoming appointments, etc.  Non-urgent messages can be sent to your provider as well.   ?To learn more about what you can do with MyChart, go to [email or web address].   ? ?Your next appointment:   ?2 month(s) ? ?The format for your next appointment:   ?In Person ? ?Provider:   ?Quay Burow, MD OR Doreene Adas PA  ? ? ? ? ?Important Information About Sugar ? ? ? ? ? ? ?

## 2021-08-07 ENCOUNTER — Ambulatory Visit (HOSPITAL_COMMUNITY)
Admission: RE | Admit: 2021-08-07 | Discharge: 2021-08-07 | Disposition: A | Discharge status: Home / Self Care | Payer: Medicare Other | Source: Ambulatory Visit | Attending: Internal Medicine | Admitting: Internal Medicine

## 2021-08-07 DIAGNOSIS — I10 Essential (primary) hypertension: Secondary | ICD-10-CM | POA: Insufficient documentation

## 2021-08-08 ENCOUNTER — Other Ambulatory Visit: Payer: Self-pay | Admitting: Physician Assistant

## 2021-08-17 LAB — LIPID PANEL
Chol/HDL Ratio: 3 ratio (ref 0.0–4.4)
Cholesterol, Total: 136 mg/dL (ref 100–199)
HDL: 46 mg/dL (ref >39)
LDL Chol Calc (NIH): 68 mg/dL (ref 0–99)
Triglycerides: 122 mg/dL (ref 0–149)
VLDL Cholesterol Cal: 22 mg/dL (ref 5–40)

## 2021-08-22 ENCOUNTER — Ambulatory Visit (INDEPENDENT_AMBULATORY_CARE_PROVIDER_SITE_OTHER): Payer: Medicare Other | Admitting: Internal Medicine

## 2021-08-22 ENCOUNTER — Encounter: Payer: Self-pay | Admitting: Cardiovascular Disease

## 2021-08-22 ENCOUNTER — Encounter: Payer: Self-pay | Admitting: Internal Medicine

## 2021-08-22 VITALS — BP 127/80 | HR 64 | Ht 63.05 in | Wt 169.6 lb

## 2021-08-22 DIAGNOSIS — E782 Mixed hyperlipidemia: Secondary | ICD-10-CM | POA: Diagnosis not present

## 2021-08-22 DIAGNOSIS — M791 Myalgia, unspecified site: Secondary | ICD-10-CM | POA: Diagnosis not present

## 2021-08-22 DIAGNOSIS — I2510 Atherosclerotic heart disease of native coronary artery without angina pectoris: Secondary | ICD-10-CM | POA: Diagnosis not present

## 2021-08-22 DIAGNOSIS — T466X5D Adverse effect of antihyperlipidemic and antiarteriosclerotic drugs, subsequent encounter: Secondary | ICD-10-CM

## 2021-08-22 NOTE — Progress Notes (Signed)
LIPID CLINIC CONSULT NOTE  Chief Complaint:  Follow-up dyslipidemia  Primary Care Physician: Biagio Borg, MD  Primary Cardiologist:  Quay Burow, MD  HPI:  Heather Merritt is a 66 y.o. female who is being seen today for the evaluation of dyslipidemia at the request of Biagio Borg, MD. This is a 66 year old female patient of Dr. Alvester Chou who was referred by Almyra Deforest, PA-C, for management of dyslipidemia.  Unfortunately she recently had chest pain was found to have a non-STEMI.  Cath in July 2020 demonstrated some coronary disease specifically in the right coronary artery requiring a drug-eluting stent placement.  Since then she has done very well without recurrent chest pain.  She started cardiac rehabilitation.  She does have a longstanding history of dyslipidemia including family history of coronary disease.  She had previously been on a number of different statin medications including atorvastatin, rosuvastatin, simvastatin, pravastatin and others with significant myalgias, particularly at the higher doses.  She had been taking atorvastatin 10 mg every other day prior to this event and was convinced to take it on a daily basis afterwards.  So far that seems to be reasonably well tolerated with some mild myalgias.  She has not had reassessment of her cholesterol since July however her total cholesterol the time was 204, triglycerides 280, HDL 36 and LDL of 112.  05/08/2019  Heather Merritt returns today for follow-up.  Overall she is doing fairly well.  Her cholesterol has improved on the Zetia.  LDL is down to 81 from 113.  Total cholesterol is 158 with triglycerides increased to 167.  This is been high in the past.  She thinks is mostly dietary related.  She is on over-the-counter fish oil which I have advised against due to lack of trial data on this.  We discussed further at length today about Vascepa and how this could be a good addition to her regimen because of the significant  cardiovascular risk reduction that was seen in the reduce it trial.  09/10/2019  Heather Merritt is seen today in follow-up.  Unfortunately she could not afford Vascepa but did make some dietary changes.  She started taking over-the-counter fish oil.  She noted that her LDL cholesterol is actually gone now up and her triglycerides though have come down.  Most recent lipid showed total cholesterol 171, triglycerides 154 and LDL of 99.  I advised that there was no data for cardiovascular risk reduction with over-the-counter fish oils and I would recommend not taking it.  Since she cannot afford the Vascepa, we will have to focus on better LDL control.  She is only on low-dose atorvastatin due to side effects.  05/30/2020  Heather Merritt is seen today in follow-up.  Unfortunately she was intolerant of Repatha as well.  She did take 2 doses but had what is described as a immune or allergic response.  She had some throat tightening and facial flushing.  This improved with antihistamine.  She did have significant lipid lowering however on the medication with total cholesterol now 85, HDL 48, triglycerides 111 and LDL 17.  Unfortunately, due to her allergic response, this will not be an option for her going forward.  10/06/2020  Heather Merritt returns today for follow-up.  She is having issues with Repatha that caused allergic symptoms.  Her cholesterol however did respond as above significantly with LDL down to 17.  Repeat testing about 2 weeks ago showed total cholesterol 172, triglycerides 165, HDL 43  and LDL 100.  She is able to tolerate atorvastatin 10 mg daily but remains above target.  02/13/2021  Heather Merritt returns today for follow-up.  She reported initially having some GI upset related to [facility] however had stopped it for a while and then restarted it with no issues.  She has remained on it in addition to ezetimibe and low-dose atorvastatin 10 mg daily.  Lipids are markedly improved with total  cholesterol 117, triglycerides 139, HDL 42 and LDL 51, down from 100.  08/22/2021  Heather Merritt is seen today for follow-up of her dyslipidemia.  There has been slight increase in her LDL cholesterol from 51-68 but otherwise pretty good control.  Total now 136, triglycerides 122 and HDL 46.  She has actually had 10 pound weight loss.  Recently blood pressure had spiked and she was prescribed low-dose amlodipine but she has not taken it as her blood pressure did improve.  In fact she was seen in the urgent care for this.  Blood pressure today was 127/80.  PMHx:  Past Medical History:  Diagnosis Date   Allergy    SEASONAL   ANXIETY 10/28/2007   Arthritis    CAD (coronary artery disease), native coronary artery    a. Cath 10/06/2018 - S/p DES to [facility]; medical therapy for high grade ostial/pro 2nd digonal    Cataracts, bilateral    immature   Chronic back pain    Coronary artery disease    DIVERTICULOSIS, COLON 10/28/2007   Dry skin    red patch on right knee area   GERD 10/28/2007   takes Prevacid daily   H/O hiatal hernia    HLD (hyperlipidemia) 12/12/2017   Hx of colonic polyps    HYPERLIPIDEMIA 10/28/2007   unable to take meds d/t allergies   HYPERTENSION 10/28/2007   takes Diovan daily   Internal hemorrhoids    Irritable bowel syndrome (IBS)    Joint pain    Joint swelling    LIVER FUNCTION TESTS, ABNORMAL, HX OF 10/29/2007   Lumbar disc disease    Migraine    Myocardial infarction ([place])    Obesity    OSTEOPENIA 10/29/2007   Osteoporosis    Presbyacusis 01/04/2010   Rosacea    Seasonal allergies    takes Clarinex daily   Vertigo    hx of   Vitamin D deficiency    takes Vit D daily    Past Surgical History:  Procedure Laterality Date   ANTERIOR CERVICAL DECOMP/DISCECTOMY FUSION  08/02/2011   Procedure: ANTERIOR CERVICAL DECOMPRESSION/DISCECTOMY FUSION 3 LEVELS;  Surgeon: Elaina Hoops, MD;  Location: [place] [facility];  Service: Neurosurgery;  Laterality: N/A;  Cervical Three-Four,  Cervical Four-Five, Cervical Five-Six  Anterior Cervical Decompression Fusion    CARDIAC CATHETERIZATION     COLONOSCOPY  2001/2013   CORONARY STENT INTERVENTION N/A 10/06/2018   Procedure: CORONARY STENT INTERVENTION;  Surgeon: Lorretta Harp, MD;  Location: [place] [facility];  Service: Cardiovascular;  Laterality: N/A;   ESOPHAGOGASTRODUODENOSCOPY     LEFT HEART CATH AND CORONARY ANGIOGRAPHY N/A 10/06/2018   Procedure: LEFT HEART CATH AND CORONARY ANGIOGRAPHY;  Surgeon: Lorretta Harp, MD;  Location: [place] [facility];  Service: Cardiovascular;  Laterality: N/A;   POLYPECTOMY     TONSILLECTOMY     TONSILLECTOMY  at age 66    FAMHx:  Family History  Problem Relation Age of Onset   Atrial fibrillation Mother    Hyperlipidemia Mother    Stroke Mother  Heart disease Mother    Atrial fibrillation Father    Hypothyroidism Sister    Hypothyroidism Maternal Grandmother    Diabetes Maternal Grandmother    Anesthesia problems Neg Hx    Hypotension Neg Hx    Malignant hyperthermia Neg Hx    Pseudochol deficiency Neg Hx    Colon cancer Neg Hx    Stomach cancer Neg Hx    Rectal cancer Neg Hx    Esophageal cancer Neg Hx     SOCHx:   reports that she has quit smoking. Her smoking use included cigarettes. She has never used smokeless tobacco. She reports that she does not currently use alcohol. She reports that she does not use drugs.  ALLERGIES:  Allergies  Allergen Reactions   Penicillins Hives, Itching and Rash    Did it involve swelling of the face/tongue/throat, SOB, or low BP? Unknown Did it involve sudden or severe rash/hives, skin peeling, or any reaction on the inside of your mouth or nose? Unknown Did you need to seek medical attention at a hospital or doctor's office? Unknown When did it last happen?      pt cant recall If all above answers are "NO", may proceed with cephalosporin use.    Rosuvastatin Other (See Comments)    REACTION: muscle weakness   Simvastatin  Other (See Comments)    REACTION: muscle weakness   Statins Other (See Comments)    Myalgias and weakness   Sulfonamide Derivatives Other (See Comments)    Nausea, vomiting, abd pain   Repatha [Evolocumab] Swelling    Swelling, itchy eyes, mouth swelling, red/puffy/hot face   Brilinta [Ticagrelor] Rash    Itchy rash to torso   Doxycycline Hyclate Itching and Rash   Sulfa Antibiotics Other (See Comments)    Nausea, vomiting, abd pain    ROS: Pertinent items noted in HPI and remainder of comprehensive ROS otherwise negative.  HOME MEDS: Current Outpatient Medications on File Prior to Visit  Medication Sig Dispense Refill   aspirin 81 MG tablet Take 81 mg by mouth daily.     atorvastatin (LIPITOR) 10 MG tablet Take 1 tablet (10 mg total) by mouth daily. 90 tablet 3   Bempedoic Acid (NEXLETOL) 180 MG TABS Take 1 Dose by mouth daily. 90 tablet 3   Calcium Citrate (CITRACAL PO) Take 2 tablets by mouth daily.      Cholecalciferol (VITAMIN D) 2000 UNITS CAPS Take 2,000 Units by mouth daily.     clopidogrel (PLAVIX) 75 MG tablet TAKE 1 TABLET EVERY DAY 90 tablet 2   esomeprazole (NEXIUM) 40 MG capsule TAKE 1 CAPSULE EVERY DAY 90 capsule 4   ezetimibe (ZETIA) 10 MG tablet TAKE 1 TABLET EVERY DAY 90 tablet 3   famotidine (PEPCID) 40 MG tablet TAKE 1 TABLET EVERY DAY 90 tablet 4   metoprolol tartrate (LOPRESSOR) 25 MG tablet TAKE 1/2 TABLET TWICE DAILY 90 tablet 2   metroNIDAZOLE (METROGEL) 0.75 % gel Apply 1 application topically at bedtime. 45 g 0   Multiple Vitamin (MULITIVITAMIN WITH MINERALS) TABS Take 1 tablet by mouth daily.     nitroGLYCERIN (NITROSTAT) 0.4 MG SL tablet Place 1 tablet (0.4 mg total) under the tongue every 5 (five) minutes x 3 doses as needed for chest pain. 25 tablet 6   pimecrolimus (ELIDEL) 1 % cream Apply 1 application topically every morning. 30 g 0   valsartan-hydrochlorothiazide (DIOVAN-HCT) 160-12.5 MG tablet TAKE 1 TABLET EVERY DAY 90 tablet 2   amLODipine  (NORVASC) 2.5  MG tablet Take 1 tablet (2.5 mg total) by mouth daily. (Patient not taking: Reported on 08/22/2021) 90 tablet 3   No current facility-administered medications on file prior to visit.    LABS/IMAGING: No results found for this or any previous visit (from the past 48 hour(s)). No results found.  LIPID PANEL:    Component Value Date/Time   CHOL 136 08/17/2021 0851   TRIG 122 08/17/2021 0851   HDL 46 08/17/2021 0851   CHOLHDL 3.0 08/17/2021 0851   CHOLHDL 4 09/12/2020 1217   VLDL 30.4 09/12/2020 1217   LDLCALC 68 08/17/2021 0851   LDLDIRECT 177.0 06/05/2017 1301    WEIGHTS: Wt Readings from Last 3 Encounters:  08/22/21 169 lb 9.6 oz (76.9 kg)  07/18/21 173 lb 3.2 oz (78.6 kg)  07/14/21 175 lb (79.4 kg)    VITALS: BP 127/80   Pulse 64   Ht 5' 3.05" (1.601 m)   Wt 169 lb 9.6 oz (76.9 kg)   SpO2 98%   BMI 30.00 kg/m   EXAM: Deferred  EKG: Deferred  ASSESSMENT: Mixed dyslipidemia, goal LDL <55 (very high risk) Relative statin intolerance Allergic reaction to Repatha CAD with recent NSTEMI and PCI to the RCA (10/2018)  PLAN: 1.   Heather Merritt has had a small increase in LDL cholesterol but otherwise has had very good control of her lipids.  As she is considered very high risk her target LDL is less than 55.  I would like to see if we can get her back to that point.  She will need to continue to work on her diet.  We talked about reducing some eggs in her diet although it does not sound like she eats a significant amount of this.  Continued exercise would also be helpful.  Would recommend follow-up lipids in 6 to 12 months and she can see me annually or sooner as necessary he does have a follow-up with Dr. Gwenlyn Found in June.  Pixie Casino, MD, [facility], [place] Director of the Advanced Lipid Disorders &  Cardiovascular Risk Reduction Clinic Diplomate of the American Board of Clinical Lipidology Attending Cardiologist  Direct  Dial: [phone]  Fax: [phone]  Website:  [email or web address] 08/22/2021, 11:12 AM

## 2021-08-22 NOTE — Patient Instructions (Signed)
Medication Instructions:  Your physician recommends that you continue on your current medications as directed. Please refer to the Current Medication list given to you today.  *If you need a refill on your cardiac medications before your next appointment, please call your pharmacy*   Lab Work: FASTING lab work to check cholesterol in 1 year   If you have labs (blood work) drawn today and your tests are completely normal, you will receive your results only by: [place] (if you have MyChart) OR A paper copy in the mail If you have any lab test that is abnormal or we need to change your treatment, we will call you to review the results.   Testing/Procedures: NONE   Follow-Up: At [facility], you and your health needs are our priority.  As part of our continuing mission to provide you with exceptional heart care, we have created designated Provider Care Teams.  These Care Teams include your primary Cardiologist (physician) and Advanced Practice Providers (APPs -  Physician Assistants and Nurse Practitioners) who all work together to provide you with the care you need, when you need it.  We recommend signing up for the patient portal called "MyChart".  Sign up information is provided on this After Visit Summary.  MyChart is used to connect with patients for Virtual Visits (Telemedicine).  Patients are able to view lab/test results, encounter notes, upcoming appointments, etc.  Non-urgent messages can be sent to your provider as well.   To learn more about what you can do with MyChart, go to [email or web address].    Your next appointment:   12 month(s)  The format for your next appointment:   In Person  Provider:   Lyman Bishop MD -- lipid clinic

## 2021-08-24 ENCOUNTER — Other Ambulatory Visit: Payer: Self-pay | Admitting: Obstetrics

## 2021-08-24 DIAGNOSIS — M8580 Other specified disorders of bone density and structure, unspecified site: Secondary | ICD-10-CM

## 2021-09-12 ENCOUNTER — Ambulatory Visit (INDEPENDENT_AMBULATORY_CARE_PROVIDER_SITE_OTHER): Payer: Medicare Other | Admitting: Internal Medicine

## 2021-09-12 ENCOUNTER — Encounter: Payer: Self-pay | Admitting: Internal Medicine

## 2021-09-12 VITALS — BP 128/70 | HR 66 | Temp 98.4°F | Ht 63.05 in | Wt 164.8 lb

## 2021-09-12 DIAGNOSIS — E559 Vitamin D deficiency, unspecified: Secondary | ICD-10-CM

## 2021-09-12 DIAGNOSIS — M542 Cervicalgia: Secondary | ICD-10-CM

## 2021-09-12 DIAGNOSIS — E538 Deficiency of other specified B group vitamins: Secondary | ICD-10-CM

## 2021-09-12 DIAGNOSIS — R739 Hyperglycemia, unspecified: Secondary | ICD-10-CM

## 2021-09-12 DIAGNOSIS — I10 Essential (primary) hypertension: Secondary | ICD-10-CM

## 2021-09-12 DIAGNOSIS — E782 Mixed hyperlipidemia: Secondary | ICD-10-CM

## 2021-09-12 DIAGNOSIS — I2510 Atherosclerotic heart disease of native coronary artery without angina pectoris: Secondary | ICD-10-CM | POA: Diagnosis not present

## 2021-09-12 LAB — CBC WITH DIFFERENTIAL/PLATELET
Basophils Absolute: 0 10*3/uL (ref 0.0–0.1)
Basophils Relative: 0.7 % (ref 0.0–3.0)
Eosinophils Absolute: 0 10*3/uL (ref 0.0–0.7)
Eosinophils Relative: 0.5 % (ref 0.0–5.0)
HCT: 41.2 % (ref 36.0–46.0)
Hemoglobin: 14 g/dL (ref 12.0–15.0)
Lymphocytes Relative: 29.1 % (ref 12.0–46.0)
Lymphs Abs: 0.9 10*3/uL (ref 0.7–4.0)
MCHC: 34 g/dL (ref 30.0–36.0)
MCV: 91.7 fl (ref 78.0–100.0)
Monocytes Absolute: 0.3 10*3/uL (ref 0.1–1.0)
Monocytes Relative: 8.2 % (ref 3.0–12.0)
Neutro Abs: 2 10*3/uL (ref 1.4–7.7)
Neutrophils Relative %: 61.5 % (ref 43.0–77.0)
Platelets: 292 10*3/uL (ref 150.0–400.0)
RBC: 4.49 Mil/uL (ref 3.87–5.11)
RDW: 13.1 % (ref 11.5–15.5)
WBC: 3.2 10*3/uL — ABNORMAL LOW (ref 4.0–10.5)

## 2021-09-12 LAB — URINALYSIS, ROUTINE W REFLEX MICROSCOPIC
Bilirubin Urine: NEGATIVE
Hgb urine dipstick: NEGATIVE
Ketones, ur: NEGATIVE
Leukocytes,Ua: NEGATIVE
Nitrite: NEGATIVE
RBC / HPF: NONE SEEN (ref 0–?)
Specific Gravity, Urine: <=1.005 — AB (ref 1.000–1.030)
Total Protein, Urine: NEGATIVE
Urine Glucose: NEGATIVE
Urobilinogen, UA: 0.2 (ref 0.0–1.0)
pH: 7 (ref 5.0–8.0)

## 2021-09-12 LAB — VITAMIN B12: Vitamin B-12: 948 pg/mL — ABNORMAL HIGH (ref 211–911)

## 2021-09-12 LAB — HEPATIC FUNCTION PANEL
ALT: 32 U/L (ref 0–35)
AST: 30 U/L (ref 0–37)
Albumin: 4.9 g/dL (ref 3.5–5.2)
Alkaline Phosphatase: 43 U/L (ref 39–117)
Bilirubin, Direct: 0.1 mg/dL (ref 0.0–0.3)
Total Bilirubin: 0.6 mg/dL (ref 0.2–1.2)
Total Protein: 7.6 g/dL (ref 6.0–8.3)

## 2021-09-12 LAB — HEMOGLOBIN A1C: Hgb A1c MFr Bld: 5.5 % (ref 4.6–6.5)

## 2021-09-12 LAB — BASIC METABOLIC PANEL
BUN: 16 mg/dL (ref 6–23)
CO2: 32 mEq/L (ref 19–32)
Calcium: 10.3 mg/dL (ref 8.4–10.5)
Chloride: 99 mEq/L (ref 96–112)
Creatinine, Ser: 0.78 mg/dL (ref 0.40–1.20)
GFR: 78.13 mL/min (ref >60.00)
Glucose, Bld: 102 mg/dL — ABNORMAL HIGH (ref 70–99)
Potassium: 4.5 mEq/L (ref 3.5–5.1)
Sodium: 139 mEq/L (ref 135–145)

## 2021-09-12 LAB — VITAMIN D 25 HYDROXY (VIT D DEFICIENCY, FRACTURES): VITD: 89.94 ng/mL (ref 30.00–100.00)

## 2021-09-12 LAB — TSH: TSH: 1.62 u[IU]/mL (ref 0.35–5.50)

## 2021-09-12 MED ORDER — CYCLOBENZAPRINE HCL 5 MG PO TABS
5.0000 mg | ORAL_TABLET | Freq: Three times a day (TID) | ORAL | 1 refills | Status: DC | PRN
Start: 2021-09-12 — End: 2022-03-07

## 2021-09-12 NOTE — Progress Notes (Signed)
Patient ID: [name], female   DOB: [birth year], 65 y.o.   MRN: [id number]         Chief Complaint:: yearly exam       HPI:  Heather Merritt is a 66 y.o. female here with c/o right posterolat neck pain with spasm, strain like, not sure how or why it started 1 wk ago, with radiation to the right upper back but not shoulder or arm, and no arm weakness or numbness. Overall mild to mod, burning, intermittent, worse to turn head to the left and right, better to lie down.Pt denies chest pain, increased sob or doe, wheezing, orthopnea, PND, increased LE swelling, palpitations, dizziness or syncope. \   Pt denies polydipsia, polyuria, or new focal neuro s/s.  Has had previous higher BP recently for several wks, was to start amlodipene rx but not taking this and did not start, b/c BP settled to normal limits just prior to starting.  Has been losing wt with better diet. No longer eating out nearly as much.    Wt Readings from Last 3 Encounters:  09/12/21 164 lb 12.8 oz (74.8 kg)  08/22/21 169 lb 9.6 oz (76.9 kg)  07/18/21 173 lb 3.2 oz (78.6 kg)   BP Readings from Last 3 Encounters:  09/12/21 128/70  08/22/21 127/80  07/18/21 132/74   Immunization History  Administered Date(s) Administered   Fluad Quad(high Dose 65+) 02/15/2020   Influenza Split 01/01/2011   Influenza, High Dose Seasonal PF 01/21/2019   Influenza, Seasonal, Injecte, Preservative Fre 03/01/2014   Influenza,inj,Quad PF,6+ Mos 04/14/2013   PFIZER(Purple Top)SARS-COV-2 Vaccination 04/26/2019, 05/18/2019, 03/10/2020   Pfizer Covid-19 Vaccine Bivalent Booster 78yr & up 01/31/2021   Td 06/24/2003, 04/02/2004   Tdap 06/05/2016   Zoster Recombinat (Shingrix) 12/22/2017, 04/02/2018, 07/05/2018   There are no preventive care reminders to display for this patient.     Past Medical History:  Diagnosis Date   Allergy    SEASONAL   ANXIETY 10/28/2007   Arthritis    CAD (coronary artery disease), native coronary artery    a.  [facility]/pro 2nd digonal    Cataracts, bilateral    immature   Chronic back pain    Coronary artery disease    DIVERTICULOSIS, COLON 10/28/2007   Dry skin    red patch on right knee area   GERD 10/28/2007   takes Prevacid daily   H/O hiatal hernia    HLD (hyperlipidemia) 12/12/2017   Hx of colonic polyps    HYPERLIPIDEMIA 10/28/2007   unable to take meds d/t allergies   HYPERTENSION 10/28/2007   takes Diovan daily   Internal hemorrhoids    Irritable bowel syndrome (IBS)    Joint pain    Joint swelling    LIVER FUNCTION TESTS, ABNORMAL, HX OF 10/29/2007   Lumbar disc disease    Migraine    [place]    Obesity    OSTEOPENIA 10/29/2007   Osteoporosis    Presbyacusis 01/04/2010   Rosacea    Seasonal allergies    takes Clarinex daily   Vertigo    hx of   Vitamin D deficiency    takes Vit D daily   Past Surgical History:  Procedure Laterality Date   ANTERIOR CERVICAL DECOMP/DISCECTOMY FUSION  08/02/2011   Procedure: ANTERIOR CERVICAL DECOMPRESSION/DISCECTOMY FUSION 3 LEVELS;  Surgeon: GElaina Hoops MD;  [facility];  Service: Neurosurgery;  Laterality:  N/A;  Cervical Three-Four, Cervical Four-Five, Cervical Five-Six  Anterior Cervical Decompression Fusion    CARDIAC CATHETERIZATION     COLONOSCOPY  2001/2013   CORONARY STENT INTERVENTION N/A 10/06/2018   Procedure: CORONARY STENT INTERVENTION;  Surgeon: Lorretta Harp, MD;  Location: [place] [facility];  Service: Cardiovascular;  Laterality: N/A;   ESOPHAGOGASTRODUODENOSCOPY     LEFT HEART CATH AND CORONARY ANGIOGRAPHY N/A 10/06/2018   Procedure: LEFT HEART CATH AND CORONARY ANGIOGRAPHY;  Surgeon: Lorretta Harp, MD;  Location: [place] [facility];  Service: Cardiovascular;  Laterality: N/A;   POLYPECTOMY     TONSILLECTOMY     TONSILLECTOMY  at age 22    reports that she has quit smoking. Her smoking use included cigarettes. She has never  used smokeless tobacco. She reports that she does not currently use alcohol. She reports that she does not use drugs. family history includes Atrial fibrillation in her father and mother; Diabetes in her maternal grandmother; Heart disease in her mother; Hyperlipidemia in her mother; Hypothyroidism in her maternal grandmother and sister; Stroke in her mother. Allergies  Allergen Reactions   Penicillins Hives, Itching and Rash    Did it involve swelling of the face/tongue/throat, SOB, or low BP? Unknown Did it involve sudden or severe rash/hives, skin peeling, or any reaction on the inside of your mouth or nose? Unknown Did you need to seek medical attention at a hospital or doctor's office? Unknown When did it last happen?      pt cant recall If all above answers are "NO", may proceed with cephalosporin use.    Rosuvastatin Other (See Comments)    REACTION: muscle weakness   Simvastatin Other (See Comments)    REACTION: muscle weakness   Statins Other (See Comments)    Myalgias and weakness   Sulfonamide Derivatives Other (See Comments)    Nausea, vomiting, abd pain   Repatha [Evolocumab] Swelling    Swelling, itchy eyes, mouth swelling, red/puffy/hot face   Brilinta [Ticagrelor] Rash    Itchy rash to torso   Doxycycline Hyclate Itching and Rash   Sulfa Antibiotics Other (See Comments)    Nausea, vomiting, abd pain   Current Outpatient Medications on File Prior to Visit  Medication Sig Dispense Refill   aspirin 81 MG tablet Take 81 mg by mouth daily.     atorvastatin (LIPITOR) 10 MG tablet Take 1 tablet (10 mg total) by mouth daily. 90 tablet 3   Bempedoic Acid (NEXLETOL) 180 MG TABS Take 1 Dose by mouth daily. 90 tablet 3   Calcium Citrate (CITRACAL PO) Take 2 tablets by mouth daily.      Cholecalciferol (VITAMIN D) 2000 UNITS CAPS Take 2,000 Units by mouth daily.     clopidogrel (PLAVIX) 75 MG tablet TAKE 1 TABLET EVERY DAY 90 tablet 2   esomeprazole (NEXIUM) 40 MG capsule TAKE 1  CAPSULE EVERY DAY 90 capsule 4   ezetimibe (ZETIA) 10 MG tablet TAKE 1 TABLET EVERY DAY 90 tablet 3   famotidine (PEPCID) 40 MG tablet TAKE 1 TABLET EVERY DAY 90 tablet 4   metoprolol tartrate (LOPRESSOR) 25 MG tablet TAKE 1/2 TABLET TWICE DAILY 90 tablet 2   metroNIDAZOLE (METROGEL) 0.75 % gel Apply 1 application topically at bedtime. 45 g 0   Multiple Vitamin (MULITIVITAMIN WITH MINERALS) TABS Take 1 tablet by mouth daily.     nitroGLYCERIN (NITROSTAT) 0.4 MG SL tablet Place 1 tablet (0.4 mg total) under the tongue every 5 (five) minutes x 3  doses as needed for chest pain. 25 tablet 6   pimecrolimus (ELIDEL) 1 % cream Apply 1 application topically every morning. 30 g 0   valsartan-hydrochlorothiazide (DIOVAN-HCT) 160-12.5 MG tablet TAKE 1 TABLET EVERY DAY 90 tablet 2   No current facility-administered medications on file prior to visit.        ROS:  All others reviewed and negative.  Objective        PE:  BP 128/70 (BP Location: Left Arm, Patient Position: Sitting, Cuff Size: Normal)   Pulse 66   Temp 98.4 F (36.9 C) (Oral)   Ht 5' 3.05" (1.601 m)   Wt 164 lb 12.8 oz (74.8 kg)   SpO2 98%   BMI 29.15 kg/m                 Constitutional: Pt appears in NAD               HENT: Head: NCAT.                Right Ear: External ear normal.                 Left Ear: External ear normal.                Eyes: . Pupils are equal, round, and reactive to light. Conjunctivae and EOM are normal               Nose: without d/c or deformity               Neck: Neck supple. Gross normal ROM               Cardiovascular: Normal rate and regular rhythm.                 Pulmonary/Chest: Effort normal and breath sounds without rales or wheezing.                Abd:  Soft, NT, ND, + BS, no organomegaly               Right post lat neck base with mild tender tension and swelling without rash               Neurological: Pt is alert. At baseline orientation, motor grossly intact               Skin: Skin  is warm. No rashes, no other new lesions, LE edema - none               Psychiatric: Pt behavior is normal without agitation   Micro: none  Cardiac tracings I have personally interpreted today:  none  Pertinent Radiological findings (summarize): none   Lab Results  Component Value Date   WBC 3.2 (L) 09/12/2021   HGB 14.0 09/12/2021   HCT 41.2 09/12/2021   PLT 292.0 09/12/2021   GLUCOSE 102 (H) 09/12/2021   CHOL 136 08/17/2021   TRIG 122 08/17/2021   HDL 46 08/17/2021   LDLDIRECT 177.0 06/05/2017   LDLCALC 68 08/17/2021   ALT 32 09/12/2021   AST 30 09/12/2021   NA 139 09/12/2021   K 4.5 09/12/2021   CL 99 09/12/2021   CREATININE 0.78 09/12/2021   BUN 16 09/12/2021   CO2 32 09/12/2021   TSH 1.62 09/12/2021   INR 0.9 10/03/2018   HGBA1C 5.5 09/12/2021   Assessment/Plan:  CHRISTIANE SISTARE is a 66 y.o. White or Caucasian [1] female with  has a past  medical history of Allergy, ANXIETY (10/28/2007), Arthritis, CAD (coronary artery disease), native coronary artery, Cataracts, bilateral, Chronic back pain, Coronary artery disease, DIVERTICULOSIS, COLON (10/28/2007), Dry skin, GERD (10/28/2007), H/O hiatal hernia, HLD (hyperlipidemia) (12/12/2017), colonic polyps, HYPERLIPIDEMIA (10/28/2007), HYPERTENSION (10/28/2007), Internal hemorrhoids, Irritable bowel syndrome (IBS), Joint pain, Joint swelling, LIVER FUNCTION TESTS, ABNORMAL, HX OF (10/29/2007), Lumbar disc disease, Migraine, Myocardial infarction ([place]), Obesity, OSTEOPENIA (10/29/2007), Osteoporosis, Presbyacusis (01/04/2010), Rosacea, Seasonal allergies, Vertigo, and Vitamin D deficiency.  Hyperglycemia Lab Results  Component Value Date   HGBA1C 5.5 09/12/2021   Stable, pt to continue current medical treatment  - diet, wt control, excercise   HLD (hyperlipidemia) Lab Results  Component Value Date   LDLCALC 68 08/17/2021   Stable, pt to continue current statin lipitor 10 qd, nexletol and zetia 10 mg qd   Essential (primary)  hypertension BP Readings from Last 3 Encounters:  09/12/21 128/70  08/22/21 127/80  07/18/21 132/74   Stable, pt to continue medical treatment lopresso 25 1/2 bid, and diovan hct 160/12.5 mg qd   Neck pain C/w msk strain, for muscle relaxer prn,  to f/u any worsening symptoms or concerns  Followup: Return in about 6 months (around 03/14/2022).  Cathlean Cower, MD 09/16/2021 1:57 PM [facility]

## 2021-09-12 NOTE — Patient Instructions (Signed)
Please take all new medication as prescribed - the muscle relaxer as needed only  Please continue all other medications as before, and refills have been done if requested.  Please have the pharmacy call with any other refills you may need.  Please continue your efforts at being more active, low cholesterol diet, and weight control.  You are otherwise up to date with prevention measures today.  Please keep your appointments with your specialists as you may have planned  Please go to the LAB at the blood drawing area for the tests to be done  You will be contacted by phone if any changes need to be made immediately.  Otherwise, you will receive a letter about your results with an explanation, but please check with MyChart first.  Please remember to sign up for MyChart if you have not done so, as this will be important to you in the future with finding out test results, communicating by private email, and scheduling acute appointments online when needed.  Please make an Appointment to return in 6 months, or sooner if needed

## 2021-09-16 ENCOUNTER — Encounter: Payer: Self-pay | Admitting: Internal Medicine

## 2021-09-16 NOTE — Assessment & Plan Note (Signed)
Lab Results  Component Value Date   HGBA1C 5.5 09/12/2021   Stable, pt to continue current medical treatment - diet, wt control, excercise  

## 2021-09-16 NOTE — Assessment & Plan Note (Addendum)
Lab Results  Component Value Date   LDLCALC 68 08/17/2021   Stable, pt to continue current statin lipitor 10 qd, nexletol and zetia 10 mg qd

## 2021-09-16 NOTE — Assessment & Plan Note (Signed)
C/w msk strain, for muscle relaxer prn,  to f/u any worsening symptoms or concerns

## 2021-09-16 NOTE — Assessment & Plan Note (Signed)
BP Readings from Last 3 Encounters:  09/12/21 128/70  08/22/21 127/80  07/18/21 132/74   Stable, pt to continue medical treatment lopresso 25 1/2 bid, and diovan hct 160/12.5 mg qd

## 2021-09-20 ENCOUNTER — Encounter: Payer: Self-pay | Admitting: Cardiovascular Disease

## 2021-09-20 ENCOUNTER — Ambulatory Visit (INDEPENDENT_AMBULATORY_CARE_PROVIDER_SITE_OTHER): Payer: Medicare Other | Admitting: Cardiovascular Disease

## 2021-09-20 DIAGNOSIS — I10 Essential (primary) hypertension: Secondary | ICD-10-CM | POA: Diagnosis not present

## 2021-09-20 DIAGNOSIS — I2510 Atherosclerotic heart disease of native coronary artery without angina pectoris: Secondary | ICD-10-CM

## 2021-09-20 DIAGNOSIS — I214 Non-ST elevation (NSTEMI) myocardial infarction: Secondary | ICD-10-CM

## 2021-09-20 DIAGNOSIS — E782 Mixed hyperlipidemia: Secondary | ICD-10-CM | POA: Diagnosis not present

## 2021-09-20 NOTE — Patient Instructions (Signed)
Medication Instructions:  Your physician recommends that you continue on your current medications as directed. Please refer to the Current Medication list given to you today.  *If you need a refill on your cardiac medications before your next appointment, please call your pharmacy*   Follow-Up: At [facility], you and your health needs are our priority.  As part of our continuing mission to provide you with exceptional heart care, we have created designated Provider Care Teams.  These Care Teams include your primary Cardiologist (physician) and Advanced Practice Providers (APPs -  Physician Assistants and Nurse Practitioners) who all work together to provide you with the care you need, when you need it.  We recommend signing up for the patient portal called "MyChart".  Sign up information is provided on this After Visit Summary.  MyChart is used to connect with patients for Virtual Visits (Telemedicine).  Patients are able to view lab/test results, encounter notes, upcoming appointments, etc.  Non-urgent messages can be sent to your provider as well.   To learn more about what you can do with MyChart, go to [email or web address].    Your next appointment:   6 month(s)  The format for your next appointment:   In Person  Provider:   Fabian Sharp, PA-C       Then, Quay Burow, MD will plan to see you again in 12 month(s).

## 2021-09-20 NOTE — Assessment & Plan Note (Signed)
History of CAD status post non-STEMI July 2020.  She had a coronary CTA that showed mid dominant RCA stenosis.  I catheterized her radially 10/06/2018 revealing high-grade mid RCA stenosis which I stented with a 2.5 x 20 mm long Synergy drug-eluting stent postdilated to 2.8 mm.  She also had ostial diagonal branch stenosis and normal LV function.  She was transition from [place] which she could not tolerate the clopidogrel.  She denies chest pain or shortness of breath.

## 2021-09-20 NOTE — Assessment & Plan Note (Signed)
History of essential hypertension a blood pressure measured today at 122/72.  She is on metoprolol, Diovan and hydrochlorothiazide.  She recently had some episodes of spikes in her blood pressure but this has stabilized.  She did have renal Doppler studies performed 08/07/2021 which were entirely normal.

## 2021-09-20 NOTE — Progress Notes (Signed)
09/20/2021 Heather Merritt   01-02-1956  [id number]  Primary Physician Biagio Borg, MD Primary Cardiologist: Lorretta Harp MD Lupe Carney, [place]  HPI:  Heather Merritt is a 66 y.o.  moderately overweight divorced Caucasian female no children who works as a Naval architect.  I last saw her in the office 04/07/2021.  I first met her during her hospitalization in July 2020 for non-STEMI.  Her risk factors include hyperlipidemia intolerant to statin therapy, treated hypertension and family history with a mother who had PCI at age 13.  She is not a smoker.  She had chest pain on 10/03/2018 with positive enzymes.  Coronary CTA showed significant mid to distal dominant RCA stenosis.  I performed radial diagnostic cath on her 10/06/2018 revealing high-grade mid RCA stenosis.  I deployed a synergy 2.5 x 20 mm long drug-eluting stent post dilating with a 2.75 mm noncompliant balloon up to 2.8 mm.  She did have an ostial diagonal branch stenosis with normal LV function.  She was discharged home on 10/07/2018 on aspirin and Brilinta.  Unfortunately she could not tolerate Brilinta and was transitioned to clopidogrel.  She is also apparently statin intolerant and is seeing Dr. Debara Pickett for treatment of hyperlipidemia.  She is currently on low-dose atorvastatin and Zetia .  She apparently was tried on Repatha but was allergic to it.   Since I saw her 6 months ago she continues to do well.  She exercises daily doing Pilates and walking.  She had recent spike in her blood pressure which has since leveled out on her current medications.  She did have a renal Doppler study performed 08/07/2021 which was normal ruling out renal vascular hypertension.  Her lipid profile is much better on the drugs that Dr. Debara Pickett has prescribed.     Current Meds  Medication Sig   aspirin 81 MG tablet Take 81 mg by mouth daily.   atorvastatin (LIPITOR) 10 MG tablet Take 1 tablet (10 mg total) by mouth daily.   Bempedoic  Acid (NEXLETOL) 180 MG TABS Take 1 Dose by mouth daily.   Calcium Citrate (CITRACAL PO) Take 2 tablets by mouth daily.    Cholecalciferol (VITAMIN D) 2000 UNITS CAPS Take 2,000 Units by mouth daily.   clopidogrel (PLAVIX) 75 MG tablet TAKE 1 TABLET EVERY DAY   cyclobenzaprine (FLEXERIL) 5 MG tablet Take 1 tablet (5 mg total) by mouth 3 (three) times daily as needed for muscle spasms.   esomeprazole (NEXIUM) 40 MG capsule TAKE 1 CAPSULE EVERY DAY   ezetimibe (ZETIA) 10 MG tablet TAKE 1 TABLET EVERY DAY   famotidine (PEPCID) 40 MG tablet TAKE 1 TABLET EVERY DAY   metoprolol tartrate (LOPRESSOR) 25 MG tablet TAKE 1/2 TABLET TWICE DAILY   metroNIDAZOLE (METROGEL) 0.75 % gel Apply 1 application topically at bedtime.   Multiple Vitamin (MULITIVITAMIN WITH MINERALS) TABS Take 1 tablet by mouth daily.   nitroGLYCERIN (NITROSTAT) 0.4 MG SL tablet Place 1 tablet (0.4 mg total) under the tongue every 5 (five) minutes x 3 doses as needed for chest pain.   pimecrolimus (ELIDEL) 1 % cream Apply 1 application topically every morning.   valsartan-hydrochlorothiazide (DIOVAN-HCT) 160-12.5 MG tablet TAKE 1 TABLET EVERY DAY     Allergies  Allergen Reactions   Penicillins Hives, Itching and Rash    Did it involve swelling of the face/tongue/throat, SOB, or low BP? Unknown Did it involve sudden or severe rash/hives, skin peeling, or any reaction on  the inside of your mouth or nose? Unknown Did you need to seek medical attention at a hospital or doctor's office? Unknown When did it last happen?      pt cant recall If all above answers are "NO", may proceed with cephalosporin use.    Rosuvastatin Other (See Comments)    REACTION: muscle weakness   Simvastatin Other (See Comments)    REACTION: muscle weakness   Statins Other (See Comments)    Myalgias and weakness   Sulfonamide Derivatives Other (See Comments)    Nausea, vomiting, abd pain   Repatha [Evolocumab] Swelling    Swelling, itchy eyes, mouth  swelling, red/puffy/hot face   Brilinta [Ticagrelor] Rash    Itchy rash to torso   Doxycycline Hyclate Itching and Rash   Sulfa Antibiotics Other (See Comments)    Nausea, vomiting, abd pain    Social History   Socioeconomic History   Marital status: Divorced    Spouse name: Not on file   Number of children: 0   Years of education: 12   Highest education level: High school graduate  Occupational History   Not on file  Tobacco Use   Smoking status: Former    Types: Cigarettes   Smokeless tobacco: Never   Tobacco comments:    quit in 11yr ago  Vaping Use   Vaping Use: Never used  Substance and Sexual Activity   Alcohol use: Not Currently    Alcohol/week: 0.0 standard drinks of alcohol    Comment: rare   Drug use: No   Sexual activity: Yes    Birth control/protection: Post-menopausal  Other Topics Concern   Not on file  Social History Narrative   Not on file   Social Determinants of Health   Financial Resource Strain: Low Risk  (12/30/2018)   Overall Financial Resource Strain (CARDIA)    Difficulty of Paying Living Expenses: Not hard at all  Food Insecurity: No Food Insecurity (12/30/2018)   Hunger Vital Sign    Worried About Running Out of Food in the Last Year: Never true    Ran Out of Food in the Last Year: Never true  Transportation Needs: No Transportation Needs (12/30/2018)   PRAPARE - THydrologist(Medical): No    Lack of Transportation (Non-Medical): No  Physical Activity: Sufficiently Active (12/30/2018)   Exercise Vital Sign    Days of Exercise per Week: 5 days    Minutes of Exercise per Session: 30 min  Stress: No Stress Concern Present (12/30/2018)   [place]   Feeling of Stress : Only a little  Social Connections: Moderately Isolated (12/30/2018)   Social Connection and Isolation Panel [NHANES]    Frequency of Communication with Friends and Family: Three  times a week    Frequency of Social Gatherings with Friends and Family: Never    Attends Religious Services: 1 to 4 times per year    Active Member of [employer] Organizations: No    Attends CArchivistMeetings: Never    Marital Status: Divorced  IHuman resources officerViolence: Unknown (12/30/2018)   Humiliation, Afraid, Rape, and Kick questionnaire    Fear of Current or Ex-Partner: Patient refused    Emotionally Abused: Patient refused    Physically Abused: Patient refused    Sexually Abused: Patient refused     Review of Systems: General: negative for chills, fever, night sweats or weight changes.  Cardiovascular: negative for chest pain,  dyspnea on exertion, edema, orthopnea, palpitations, paroxysmal nocturnal dyspnea or shortness of breath Dermatological: negative for rash Respiratory: negative for cough or wheezing Urologic: negative for hematuria Abdominal: negative for nausea, vomiting, diarrhea, bright red blood per rectum, melena, or hematemesis Neurologic: negative for visual changes, syncope, or dizziness All other systems reviewed and are otherwise negative except as noted above.    Blood pressure 122/72, pulse 74, height _0  (1.6 m), weight 164 lb 12.8 oz (74.8 kg), SpO2 98 %.  General appearance: alert and no distress Neck: no adenopathy, no carotid bruit, no JVD, supple, symmetrical, trachea midline, and thyroid not enlarged, symmetric, no tenderness/mass/nodules Lungs: clear to auscultation bilaterally Heart: regular rate and rhythm, S1, S2 normal, no murmur, click, rub or gallop Extremities: extremities normal, atraumatic, no cyanosis or edema Pulses: 2+ and symmetric Skin: Skin color, texture, turgor normal. No rashes or lesions Neurologic: Grossly normal  EKG not performed today  ASSESSMENT AND PLAN:   Essential (primary) hypertension History of essential hypertension a blood pressure measured today at 122/72.  She is on metoprolol, Diovan and  hydrochlorothiazide.  She recently had some episodes of spikes in her blood pressure but this has stabilized.  She did have renal Doppler studies performed 08/07/2021 which were entirely normal.  HLD (hyperlipidemia) History of hyperlipidemia followed by Dr. Debara Pickett on low-dose statin therapy, Nexletol and Zetia with a lipid profile performed 08/17/2021 revealing a total cholesterol of 136, LDL 68 and HDL 46.  NSTEMI (non-ST elevated myocardial infarction) [facility]) History of CAD status post non-STEMI July 2020.  She had a coronary CTA that showed mid dominant RCA stenosis.  I catheterized her radially 10/06/2018 revealing high-grade mid RCA stenosis which I stented with a 2.5 x 20 mm long Synergy drug-eluting stent postdilated to 2.8 mm.  She also had ostial diagonal branch stenosis and normal LV function.  She was transition from [place] which she could not tolerate the clopidogrel.  She denies chest pain or shortness of breath.     Lorretta Harp MD FACP,FACC,FAHA, [facility] 09/20/2021 3:59 PM

## 2021-09-20 NOTE — Assessment & Plan Note (Signed)
History of hyperlipidemia followed by Dr. Debara Pickett on low-dose statin therapy, Nexletol and Zetia with a lipid profile performed 08/17/2021 revealing a total cholesterol of 136, LDL 68 and HDL 46.

## 2021-10-30 ENCOUNTER — Other Ambulatory Visit: Payer: Self-pay | Admitting: Internal Medicine

## 2021-10-30 NOTE — Telephone Encounter (Signed)
Please refill as per office routine med refill policy (all routine meds to be refilled for 3 mo or monthly (per pt preference) up to one year from last visit, then month to month grace period for 3 mo, then further med refills will have to be denied) ? ?

## 2021-11-03 ENCOUNTER — Other Ambulatory Visit: Payer: Self-pay | Admitting: Internal Medicine

## 2021-11-09 DIAGNOSIS — H9311 Tinnitus, right ear: Secondary | ICD-10-CM | POA: Insufficient documentation

## 2021-11-09 DIAGNOSIS — H6991 Unspecified Eustachian tube disorder, right ear: Secondary | ICD-10-CM | POA: Insufficient documentation

## 2022-01-07 ENCOUNTER — Encounter (INDEPENDENT_AMBULATORY_CARE_PROVIDER_SITE_OTHER): Payer: Self-pay

## 2022-01-07 ENCOUNTER — Ambulatory Visit (INDEPENDENT_AMBULATORY_CARE_PROVIDER_SITE_OTHER): Payer: Medicare Other | Admitting: Family Medicine

## 2022-01-07 VITALS — BP 148/78 | HR 70 | Temp 98.6°F | Resp 12 | Ht 63.0 in | Wt 159.6 lb

## 2022-01-07 DIAGNOSIS — J069 Acute upper respiratory infection, unspecified: Secondary | ICD-10-CM

## 2022-01-07 DIAGNOSIS — Z20822 Contact with and (suspected) exposure to covid-19: Secondary | ICD-10-CM

## 2022-01-07 DIAGNOSIS — J029 Acute pharyngitis, unspecified: Secondary | ICD-10-CM

## 2022-01-07 LAB — POCT INFLUENZA A/B
POCT Rapid Influenza A AG: NEGATIVE
POCT Rapid Influenza B AG: NEGATIVE

## 2022-01-07 LAB — POCT RAPID STREP A: Rapid Strep A Screen POCT: NEGATIVE

## 2022-01-07 LAB — IN OFFICE COVID POCT ABBOTT ID NOW: SARS CoV 2 Overall Result: NEGATIVE

## 2022-01-07 NOTE — Progress Notes (Signed)
[facility]  PROGRESS NOTE     Patient: Olivia Ballard   Date: 01/07/2022   MRN: [id number]       Olivia Ballard is a 66 y.o. female      HISTORY     History obtained from: Patient    Chief Complaint   Patient presents with    Cough     X 2 days ago. Nasal congestion, post nasal drip cough, sore throat. Coricidin with some relief. Denies fever, N/V/D. Wants to get tested for strep, covid, and flu.        HPI     Review of Systems    History:    Pertinent Past Medical, Surgical, Family and Social History were reviewed.        Current Outpatient Medications:     aspirin 81 MG chewable tablet, , Disp: , Rfl:     atorvastatin (LIPITOR) 10 MG tablet, , Disp: , Rfl:     calcium carbonate 1250 mg/5 mL oral suspension, Take 2 tablets by mouth, Disp: , Rfl:     Cholecalciferol (Vitamin D) 10 MCG/ML Liquid, , Disp: , Rfl:     clopidogrel (PLAVIX) 75 mg tablet, , Disp: , Rfl:     esomeprazole (NexIUM) 40 MG capsule, , Disp: , Rfl:     ezetimibe (ZETIA) 10 MG tablet, , Disp: , Rfl:     famotidine (PEPCID) 40 MG tablet, , Disp: , Rfl:     metoprolol tartrate (LOPRESSOR) 25 MG tablet, , Disp: , Rfl:     metroNIDAZOLE (METROGEL) 1 % gel, , Disp: , Rfl:     Multiple Vitamins-Minerals (MULTIVITAMIN PO LIQD), , Disp: , Rfl:     Nexletol 180 MG Tab, , Disp: , Rfl:     valsartan-hydrochlorothiazide (DIOVAN-HCT) 160-12.5 MG per tablet, , Disp: , Rfl:     azithromycin (ZITHROMAX) 250 MG tablet, , Disp: , Rfl:     clopidogrel (PLAVIX) 300 MG tablet, , Disp: , Rfl:     lansoprazole (PREVACID) 15 MG capsule, , Disp: , Rfl:     TOBRADEX ST 0.3-0.05 % SUSP, , Disp: , Rfl:     valsartan-hydroCHLOROthiazide (DIOVAN-HCT) 160-12.5 MG per tablet, Take by mouth (Patient not taking: Reported on 01/07/2022), Disp: , Rfl:     Allergies   Allergen Reactions    Sulfa Antibiotics Anaphylaxis and Nausea And Vomiting    Sulfites     Penicillins Rash       Medications and Allergies reviewed.    PHYSICAL EXAM     Vitals:    01/07/22 1052   BP: 148/78    BP Site: Left arm   Patient Position: Sitting   Cuff Size: Medium   Pulse: 70   Resp: 12   Temp: 98.6 F (37 C)   TempSrc: Tympanic   SpO2: 98%   Weight: 72.4 kg (159 lb 9.6 oz)   Height: 1.6 m (5\' 3" )       Physical Exam    [facility] COURSE     LABS  The following POCT tests were ordered, reviewed and discussed with the patient/family.     Results       Procedure Component Value Units Date/Time    POCT Influenza A/B  (Normal) Collected: 01/07/22 1117     Updated: 01/07/22 1118     POCT QC Pass     POCT Rapid Influenza A AG Negative     POCT Rapid Influenza B AG Negative    [facility]  ID Now SARS-COV-2 POCT [[id number]]  (Normal) Collected: 01/07/22 1115    Specimen: Nasal Swab COVID-19 Updated: 01/07/22 1115     SARS CoV 2 Overall Result Negative    POCT Rapid Group A Strep [[id number]]  (Normal) Collected: 01/07/22 1113    Specimen: Throat Updated: 01/07/22 1113     POCT QC Pass     Rapid Strep A Screen POCT Negative     Comment Negative Results should be confirmed by throat Cx to confirm absence of Strep A inf.            There were no x-rays reviewed with this patient during the visit.    No current facility-administered medications for this visit.       PROCEDURES     Procedures    MEDICAL DECISION MAKING     History, physical, labs/studies most consistent with URI as the diagnosis.    Chart Review:  Prior PCP, Specialist and/or ED notes reviewed today: Yes  Prior labs/images/studies reviewed today: Yes    Differential Diagnosis: URI, STABLE      ASSESSMENT     Encounter Diagnoses   Name Primary?    Sore throat Yes    Viral upper respiratory infection             PLAN      PLAN: Patient to follow up with [facility] primary care doctor or GO TO THE E.R. if symptoms worsening or not resolving as expected.   Patient verbalized understanding.   Reviewed discharge instructions that are included with patient's MyChart. Questions answered.         Orders Placed This Encounter   Procedures    POCT Influenza A/B     POCT Rapid Group A Strep    [facility] ID Now SARS-COV-2 POCT     Requested Prescriptions      No prescriptions requested or ordered in this encounter       Discussed results and diagnosis with patient/family.  Reviewed warning signs for worsening condition, as well as, indications for follow-up with primary care physician and return to urgent care clinic.   Patient/family expressed understanding of instructions.     An After Visit Summary was provided to the patient.

## 2022-01-07 NOTE — Patient Instructions (Signed)
REST, PLENTY FLUIDS; HEALTHY DIET    CONTROL FEVER OVER 100.4 F WITH TYLENOL     OTC   CLARITIN  DAILY AS NEEDED FOR RUNNY NOSE    VITAMIN C 500 twice daily    ZICAM LOZENGES  AS NEEDED FOR SORE THROAT

## 2022-01-31 ENCOUNTER — Other Ambulatory Visit: Payer: Self-pay | Admitting: Internal Medicine

## 2022-02-23 ENCOUNTER — Other Ambulatory Visit: Payer: Self-pay

## 2022-02-23 ENCOUNTER — Emergency Department (HOSPITAL_BASED_OUTPATIENT_CLINIC_OR_DEPARTMENT_OTHER)
Admission: EM | Admit: 2022-02-23 | Discharge: 2022-02-23 | Disposition: A | Discharge status: Home / Self Care | Payer: Medicare Other | Attending: Emergency Medicine | Admitting: Emergency Medicine

## 2022-02-23 ENCOUNTER — Encounter (HOSPITAL_BASED_OUTPATIENT_CLINIC_OR_DEPARTMENT_OTHER): Payer: Self-pay | Admitting: Emergency Medicine

## 2022-02-23 DIAGNOSIS — Z7982 Long term (current) use of aspirin: Secondary | ICD-10-CM | POA: Diagnosis not present

## 2022-02-23 DIAGNOSIS — N3001 Acute cystitis with hematuria: Secondary | ICD-10-CM | POA: Insufficient documentation

## 2022-02-23 DIAGNOSIS — R319 Hematuria, unspecified: Secondary | ICD-10-CM | POA: Diagnosis present

## 2022-02-23 LAB — URINALYSIS, ROUTINE W REFLEX MICROSCOPIC
Glucose, UA: NEGATIVE mg/dL
Ketones, ur: NEGATIVE mg/dL
Nitrite: POSITIVE — AB
Protein, ur: >=300 mg/dL — AB
Specific Gravity, Urine: 1.015 (ref 1.005–1.030)
pH: 5.5 (ref 5.0–8.0)

## 2022-02-23 LAB — URINALYSIS, MICROSCOPIC (REFLEX): RBC / HPF: >50 RBC/hpf (ref 0–5)

## 2022-02-23 MED ORDER — PHENAZOPYRIDINE HCL 200 MG PO TABS: 200.0000 mg | ORAL_TABLET | Freq: Three times a day (TID) | ORAL | 0 refills | Status: DC

## 2022-02-23 MED ORDER — PHENAZOPYRIDINE HCL 100 MG PO TABS
200.0000 mg | ORAL_TABLET | Freq: Once | ORAL | Status: AC
  Administered 2022-02-23: 200 mg via ORAL
  Filled 2022-02-23: qty 2

## 2022-02-23 MED ORDER — CEPHALEXIN 500 MG PO CAPS: 500.0000 mg | ORAL_CAPSULE | Freq: Two times a day (BID) | ORAL | 0 refills | Status: AC

## 2022-02-23 MED ORDER — CEPHALEXIN 250 MG PO CAPS
500.0000 mg | ORAL_CAPSULE | Freq: Once | ORAL | Status: AC
Start: 2022-02-23 — End: 2022-02-23
  Administered 2022-02-23: 500 mg via ORAL
  Filled 2022-02-23: qty 2

## 2022-02-23 NOTE — ED Provider Notes (Signed)
[facility] Provider Note   CSN: [id number] Arrival date & time: 02/23/22  2126     History  Chief Complaint  Patient presents with   Hematuria    Heather Merritt is a 66 y.o. female.  66 year old female presents with complaint of dysuria, frequency since yesterday.  Now with complaint of blood in her urine onset 2 hours prior to arrival.  Patient is taking AZO without improvement in her symptoms.  She denies history of kidney stones, back pain, nausea, vomiting, fevers, chills.  No other complaints or concerns.       Home Medications Prior to Admission medications   Medication Sig Start Date End Date Taking? Authorizing Provider  cephALEXin (KEFLEX) 500 MG capsule Take 1 capsule (500 mg total) by mouth 2 (two) times daily for 5 days. 02/23/22 02/28/22 Yes Tacy Learn, PA-C  phenazopyridine (PYRIDIUM) 200 MG tablet Take 1 tablet (200 mg total) by mouth 3 (three) times daily. 02/23/22  Yes Tacy Learn, PA-C  aspirin 81 MG tablet Take 81 mg by mouth daily.    [provider]  atorvastatin (LIPITOR) 10 MG tablet TAKE 1 TABLET EVERY DAY 01/31/22   Hilty, Nadean Corwin, MD  Calcium Citrate (CITRACAL PO) Take 2 tablets by mouth daily.     [provider]  Cholecalciferol (VITAMIN D) 2000 UNITS CAPS Take 2,000 Units by mouth daily.    [provider]  clopidogrel (PLAVIX) 75 MG tablet TAKE 1 TABLET EVERY DAY 01/31/22   Biagio Borg, MD  cyclobenzaprine (FLEXERIL) 5 MG tablet Take 1 tablet (5 mg total) by mouth 3 (three) times daily as needed for muscle spasms. 09/12/21   Biagio Borg, MD  esomeprazole (NEXIUM) 40 MG capsule TAKE 1 CAPSULE EVERY DAY 10/30/21   Biagio Borg, MD  ezetimibe (ZETIA) 10 MG tablet TAKE 1 TABLET EVERY DAY 01/31/22   Hilty, Nadean Corwin, MD  famotidine (PEPCID) 40 MG tablet TAKE 1 TABLET EVERY DAY 08/09/21   Esterwood, Amy S, PA-C  metoprolol tartrate (LOPRESSOR) 25 MG tablet TAKE 1/2 TABLET TWICE DAILY  01/31/22   Biagio Borg, MD  metroNIDAZOLE (METROGEL) 0.75 % gel Apply 1 application topically at bedtime. 06/11/17   Biagio Borg, MD  Multiple Vitamin (MULITIVITAMIN WITH MINERALS) TABS Take 1 tablet by mouth daily.    [provider]  NEXLETOL 180 MG TABS TAKE 1 TABLET EVERY DAY 11/03/21   Hilty, Nadean Corwin, MD  nitroGLYCERIN (NITROSTAT) 0.4 MG SL tablet Place 1 tablet (0.4 mg total) under the tongue every 5 (five) minutes x 3 doses as needed for chest pain. 04/07/21   Lorretta Harp, MD  pimecrolimus (ELIDEL) 1 % cream Apply 1 application topically every morning. 06/11/17   Biagio Borg, MD  valsartan-hydrochlorothiazide (DIOVAN-HCT) 160-12.5 MG tablet TAKE 1 TABLET EVERY DAY 01/31/22   Biagio Borg, MD      Allergies    Penicillins, Rosuvastatin, Simvastatin, Statins, Sulfonamide derivatives, Evolocumab, Misc. sulfonamide containing compounds, Other, Penicillin v potassium, Brilinta [ticagrelor], Doxycycline, Doxycycline hyclate, and Sulfa antibiotics    Review of Systems   Review of Systems Negative except as per HPI Physical Exam Updated Vital Signs BP (!) 144/78 (BP Location: Left Arm)   Pulse 72   Temp 98 F (36.7 C) (Oral)   Resp 17   Ht 5' 3.5" (1.613 m)   Wt 70.8 kg   SpO2 98%   BMI 27.20 kg/m  Physical Exam Vitals and nursing note  reviewed.  Constitutional:      General: She is not in acute distress.    Appearance: She is well-developed. She is not diaphoretic.  HENT:     Head: Normocephalic and atraumatic.  Cardiovascular:     Rate and Rhythm: Normal rate and regular rhythm.     Heart sounds: Normal heart sounds.  Pulmonary:     Effort: Pulmonary effort is normal.     Breath sounds: Normal breath sounds.  Abdominal:     General: There is no distension.     Palpations: Abdomen is soft.     Tenderness: There is abdominal tenderness in the suprapubic area. There is no right CVA tenderness or left CVA tenderness.  Skin:    General: Skin is warm and dry.      Findings: No erythema or rash.  Neurological:     Mental Status: She is alert and oriented to person, place, and time.  Psychiatric:        Behavior: Behavior normal.     ED Results / Procedures / Treatments   Labs (all labs ordered are listed, but only abnormal results are displayed) Labs Reviewed  URINALYSIS, ROUTINE W REFLEX MICROSCOPIC - Abnormal; Notable for the following components:      Result Value   Color, Urine RED (*)    APPearance CLOUDY (*)    Hgb urine dipstick LARGE (*)    Bilirubin Urine SMALL (*)    Protein, ur >=300 (*)    Nitrite POSITIVE (*)    Leukocytes,Ua TRACE (*)    All other components within normal limits  URINALYSIS, MICROSCOPIC (REFLEX) - Abnormal; Notable for the following components:   Bacteria, UA MANY (*)    All other components within normal limits    EKG None  Radiology No results found.  Procedures Procedures    Medications Ordered in ED Medications  cephALEXin (KEFLEX) capsule 500 mg (has no administration in time range)  phenazopyridine (PYRIDIUM) tablet 200 mg (has no administration in time range)    ED Course/ Medical Decision Making/ A&P                           Medical Decision Making Amount and/or Complexity of Data Reviewed Labs: ordered.  Risk Prescription drug management.   66 year old female past medical history of GERD, hyperglycemia, NSTEMI times with complaint of dysuria, frequency and hematuria.  No history of stones, no concerning systemic symptoms as above.  On exam, has mild suprapubic tenderness, no CVA tenderness.  Urinalysis is positive for nitrites, leukocytes, large hemoglobin and many bacteria.  Patient is provided with first dose of Keflex as well as Pyridium in the ER, discharged with prescription for same.  Discussed return to ER precautions otherwise recommend recheck with PCP next week.        Final Clinical Impression(s) / ED Diagnoses Final diagnoses:  Acute cystitis with hematuria     Rx / DC Orders ED Discharge Orders          Ordered    cephALEXin (KEFLEX) 500 MG capsule  2 times daily        02/23/22 2316    phenazopyridine (PYRIDIUM) 200 MG tablet  3 times daily        02/23/22 2316              Tacy Learn, PA-C 02/23/22 2320    Leanord Asal K, DO 02/23/22 2326

## 2022-02-23 NOTE — ED Triage Notes (Signed)
Urinary frequency, hematuria, pressure, burning with urination since yesterday. Denies fevers, n/v.

## 2022-02-23 NOTE — Discharge Instructions (Addendum)
Take Keflex as prescribed and complete the full course.  Pyridium as needed as prescribed for pain.  Recheck with your primary care doctor next week.  To ER for fever, vomiting, inability to empty your bladder or any other concerning symptoms.

## 2022-03-01 ENCOUNTER — Ambulatory Visit
Admission: RE | Admit: 2022-03-01 | Discharge: 2022-03-01 | Disposition: A | Discharge status: Home / Self Care | Payer: Medicare Other | Source: Ambulatory Visit | Attending: Obstetrics | Admitting: Obstetrics

## 2022-03-01 DIAGNOSIS — M8580 Other specified disorders of bone density and structure, unspecified site: Secondary | ICD-10-CM

## 2022-03-07 ENCOUNTER — Ambulatory Visit (INDEPENDENT_AMBULATORY_CARE_PROVIDER_SITE_OTHER): Payer: Medicare Other | Admitting: Internal Medicine

## 2022-03-07 ENCOUNTER — Encounter: Payer: Self-pay | Admitting: Internal Medicine

## 2022-03-07 VITALS — BP 122/74 | HR 70 | Temp 98.7°F | Ht 63.5 in | Wt 155.0 lb

## 2022-03-07 DIAGNOSIS — Z23 Encounter for immunization: Secondary | ICD-10-CM | POA: Diagnosis not present

## 2022-03-07 DIAGNOSIS — I2510 Atherosclerotic heart disease of native coronary artery without angina pectoris: Secondary | ICD-10-CM

## 2022-03-07 DIAGNOSIS — I10 Essential (primary) hypertension: Secondary | ICD-10-CM | POA: Diagnosis not present

## 2022-03-07 DIAGNOSIS — N839 Noninflammatory disorder of ovary, fallopian tube and broad ligament, unspecified: Secondary | ICD-10-CM

## 2022-03-07 DIAGNOSIS — E782 Mixed hyperlipidemia: Secondary | ICD-10-CM

## 2022-03-07 DIAGNOSIS — R739 Hyperglycemia, unspecified: Secondary | ICD-10-CM | POA: Diagnosis not present

## 2022-03-07 DIAGNOSIS — N390 Urinary tract infection, site not specified: Secondary | ICD-10-CM | POA: Insufficient documentation

## 2022-03-07 DIAGNOSIS — N3000 Acute cystitis without hematuria: Secondary | ICD-10-CM

## 2022-03-07 NOTE — Assessment & Plan Note (Addendum)
S/p recent tx, pt reassured, exam benign, and already planned for repeat UA with gyn soon

## 2022-03-07 NOTE — Progress Notes (Signed)
Patient ID: Heather Merritt, female   DOB: Aug 12, 1955, 66 y.o.   MRN: [id number]        Chief Complaint: follow up recent uti, complex cyst left ovary, htn, hld, hyperglycemia, immunizations       HPI:  Heather Merritt is a 66 y.o. female here overall well, Denies urinary symptoms such as dysuria, frequency, urgency, flank pain, hematuria or n/v, fever, chills, has f/u UA with GYN planned soon.  Also noted to have complex cyst left ovary, very small per pt report, and to f/u at 3 months for repeat u/s.  Pt denies chest pain, increased sob or doe, wheezing, orthopnea, PND, increased LE swelling, palpitations, dizziness or syncope.   Pt denies polydipsia, polyuria, or new focal neuro s/s.    Pt denies fever, wt loss, night sweats, loss of appetite, or other constitutional symptoms  Due for flu shot, and will return in 1 mo for prevnar 20       Wt Readings from Last 3 Encounters:  03/07/22 155 lb (70.3 kg)  02/23/22 156 lb (70.8 kg)  09/20/21 164 lb 12.8 oz (74.8 kg)   BP Readings from Last 3 Encounters:  03/07/22 122/74  02/23/22 130/85  09/20/21 122/72         Past Medical History:  Diagnosis Date   Allergy    SEASONAL   ANXIETY 10/28/2007   Arthritis    CAD (coronary artery disease), native coronary artery    a. Cath 10/06/2018 - S/p DES to [facility]; medical therapy for high grade ostial/pro 2nd digonal    Cataracts, bilateral    immature   Chronic back pain    Coronary artery disease    DIVERTICULOSIS, COLON 10/28/2007   Dry skin    red patch on right knee area   GERD 10/28/2007   takes Prevacid daily   H/O hiatal hernia    HLD (hyperlipidemia) 12/12/2017   Hx of colonic polyps    HYPERLIPIDEMIA 10/28/2007   unable to take meds d/t allergies   HYPERTENSION 10/28/2007   takes Diovan daily   Internal hemorrhoids    Irritable bowel syndrome (IBS)    Joint pain    Joint swelling    LIVER FUNCTION TESTS, ABNORMAL, HX OF 10/29/2007   Lumbar disc disease    Migraine    Myocardial  infarction ([place])    Obesity    OSTEOPENIA 10/29/2007   Osteoporosis    Presbyacusis 01/04/2010   Rosacea    Seasonal allergies    takes Clarinex daily   Vertigo    hx of   Vitamin D deficiency    takes Vit D daily   Past Surgical History:  Procedure Laterality Date   ANTERIOR CERVICAL DECOMP/DISCECTOMY FUSION  08/02/2011   Procedure: ANTERIOR CERVICAL DECOMPRESSION/DISCECTOMY FUSION 3 LEVELS;  Surgeon: Elaina Hoops, MD;  Location: [facility];  Service: Neurosurgery;  Laterality: N/A;  Cervical Three-Four, Cervical Four-Five, Cervical Five-Six  Anterior Cervical Decompression Fusion    CARDIAC CATHETERIZATION     COLONOSCOPY  2001/2013   CORONARY STENT INTERVENTION N/A 10/06/2018   Procedure: CORONARY STENT INTERVENTION;  Surgeon: Lorretta Harp, MD;  Location: [facility];  Service: Cardiovascular;  Laterality: N/A;   ESOPHAGOGASTRODUODENOSCOPY     LEFT HEART CATH AND CORONARY ANGIOGRAPHY N/A 10/06/2018   Procedure: LEFT HEART CATH AND CORONARY ANGIOGRAPHY;  Surgeon: Lorretta Harp, MD;  Location: [facility];  Service: Cardiovascular;  Laterality: N/A;   POLYPECTOMY  TONSILLECTOMY     TONSILLECTOMY  at age 87    reports that she has quit smoking. Her smoking use included cigarettes. She has never used smokeless tobacco. She reports that she does not currently use alcohol. She reports that she does not use drugs. family history includes Atrial fibrillation in her father and mother; Diabetes in her maternal grandmother; Heart disease in her mother; Hyperlipidemia in her mother; Hypothyroidism in her maternal grandmother and sister; Stroke in her mother. Allergies  Allergen Reactions   Penicillins Hives, Itching and Rash    Did it involve swelling of the face/tongue/throat, SOB, or low BP? Unknown Did it involve sudden or severe rash/hives, skin peeling, or any reaction on the inside of your mouth or nose? Unknown Did you need to seek medical attention at a hospital  or doctor's office? Unknown When did it last happen?      pt cant recall If all above answers are "NO", may proceed with cephalosporin use.    Rosuvastatin Other (See Comments)    REACTION: muscle weakness   Simvastatin Other (See Comments)    REACTION: muscle weakness   Statins Other (See Comments)    Myalgias and weakness   Sulfonamide Derivatives Other (See Comments)    Nausea, vomiting, abd pain   Evolocumab Swelling and Other (See Comments)    Swelling, itchy eyes, mouth swelling, red/puffy/hot face   Misc. Sulfonamide Containing Compounds Other (See Comments)   Other    Penicillin V Potassium Other (See Comments)   Brilinta [Ticagrelor] Rash    Itchy rash to torso   Doxycycline Itching and Rash   Doxycycline Hyclate Itching and Rash   Sulfa Antibiotics Other (See Comments)    Nausea, vomiting, abd pain   Current Outpatient Medications on File Prior to Visit  Medication Sig Dispense Refill   aspirin 81 MG tablet Take 81 mg by mouth daily.     atorvastatin (LIPITOR) 10 MG tablet TAKE 1 TABLET EVERY DAY 90 tablet 3   Calcium Citrate (CITRACAL PO) Take 2 tablets by mouth daily.      Cholecalciferol (VITAMIN D) 2000 UNITS CAPS Take 2,000 Units by mouth daily.     clopidogrel (PLAVIX) 75 MG tablet TAKE 1 TABLET EVERY DAY 90 tablet 2   esomeprazole (NEXIUM) 40 MG capsule TAKE 1 CAPSULE EVERY DAY 90 capsule 1   ezetimibe (ZETIA) 10 MG tablet TAKE 1 TABLET EVERY DAY 90 tablet 3   famotidine (PEPCID) 40 MG tablet TAKE 1 TABLET EVERY DAY 90 tablet 4   metoprolol tartrate (LOPRESSOR) 25 MG tablet TAKE 1/2 TABLET TWICE DAILY 90 tablet 2   metroNIDAZOLE (METROGEL) 0.75 % gel Apply 1 application topically at bedtime. 45 g 0   Multiple Vitamin (MULITIVITAMIN WITH MINERALS) TABS Take 1 tablet by mouth daily.     NEXLETOL 180 MG TABS TAKE 1 TABLET EVERY DAY 90 tablet 3   nitroGLYCERIN (NITROSTAT) 0.4 MG SL tablet Place 1 tablet (0.4 mg total) under the tongue every 5 (five) minutes x 3  doses as needed for chest pain. 25 tablet 6   pimecrolimus (ELIDEL) 1 % cream Apply 1 application topically every morning. 30 g 0   valsartan-hydrochlorothiazide (DIOVAN-HCT) 160-12.5 MG tablet TAKE 1 TABLET EVERY DAY 90 tablet 2   amLODipine (NORVASC) 2.5 MG tablet Take by mouth. (Patient not taking: Reported on 03/07/2022)     No current facility-administered medications on file prior to visit.        ROS:  All others reviewed and  negative.  Objective        PE:  BP 122/74 (BP Location: Left Arm, Patient Position: Standing, Cuff Size: Large)   Pulse 70   Temp 98.7 F (37.1 C) (Oral)   Ht 5' 3.5" (1.613 m)   Wt 155 lb (70.3 kg)   SpO2 98%   BMI 27.03 kg/m                 Constitutional: Pt appears in NAD               HENT: Head: NCAT.                Right Ear: External ear normal.                 Left Ear: External ear normal.                Eyes: . Pupils are equal, round, and reactive to light. Conjunctivae and EOM are normal               Nose: without d/c or deformity               Neck: Neck supple. Gross normal ROM               Cardiovascular: Normal rate and regular rhythm.                 Pulmonary/Chest: Effort normal and breath sounds without rales or wheezing.                Abd:  Soft, NT, ND, + BS, no organomegaly               Neurological: Pt is alert. At baseline orientation, motor grossly intact               Skin: Skin is warm. No rashes, no other new lesions, LE edema - noine               Psychiatric: Pt behavior is normal without agitation   Micro: none  Cardiac tracings I have personally interpreted today:  none  Pertinent Radiological findings (summarize): none   Lab Results  Component Value Date   WBC 3.2 (L) 09/12/2021   HGB 14.0 09/12/2021   HCT 41.2 09/12/2021   PLT 292.0 09/12/2021   GLUCOSE 102 (H) 09/12/2021   CHOL 136 08/17/2021   TRIG 122 08/17/2021   HDL 46 08/17/2021   LDLDIRECT 177.0 06/05/2017   LDLCALC 68 08/17/2021   ALT 32  09/12/2021   AST 30 09/12/2021   NA 139 09/12/2021   K 4.5 09/12/2021   CL 99 09/12/2021   CREATININE 0.78 09/12/2021   BUN 16 09/12/2021   CO2 32 09/12/2021   TSH 1.62 09/12/2021   INR 0.9 10/03/2018   HGBA1C 5.5 09/12/2021   Assessment/Plan:  BALEY SHANDS is a 66 y.o. White or Caucasian [1] female with  has a past medical history of Allergy, ANXIETY (10/28/2007), Arthritis, CAD (coronary artery disease), native coronary artery, Cataracts, bilateral, Chronic back pain, Coronary artery disease, DIVERTICULOSIS, COLON (10/28/2007), Dry skin, GERD (10/28/2007), H/O hiatal hernia, HLD (hyperlipidemia) (12/12/2017), colonic polyps, HYPERLIPIDEMIA (10/28/2007), HYPERTENSION (10/28/2007), Internal hemorrhoids, Irritable bowel syndrome (IBS), Joint pain, Joint swelling, LIVER FUNCTION TESTS, ABNORMAL, HX OF (10/29/2007), Lumbar disc disease, Migraine, Myocardial infarction ([place]), Obesity, OSTEOPENIA (10/29/2007), Osteoporosis, Presbyacusis (01/04/2010), Rosacea, Seasonal allergies, Vertigo, and Vitamin D deficiency.  Essential (primary) hypertension BP Readings from Last 3 Encounters:  03/07/22 122/74  02/23/22 130/85  09/20/21 122/72   Stable, pt to continue medical treatment lopressor 25 1/2 bid, diovan hct 160-12.5 qd, but not taking the amlodipine 2.5 mg (has bottle at home) as BP seemed to improve on its own after cardiology visit earlier this yr   HLD (hyperlipidemia) Lab Results  Component Value Date   LDLCALC 68 08/17/2021   Stable, pt to continue current statin zetia 10, nexletol 180 qd, lipitor 10 qd   Hyperglycemia Lab Results  Component Value Date   HGBA1C 5.5 09/12/2021   Stable, pt to continue current medical treatment - diet, wt control, excercise   Lesion of ovary With possible small complex cyst left ovary, pt will f/u with GYN a planned at 3 mo  UTI (urinary tract infection) S/p recent tx, pt reassured, exam benign, and already planned for repeat UA with gyn  soon  Followup: Return in about 6 months (around 09/06/2022).  Cathlean Cower, MD 03/07/2022 1:12 PM [facility]

## 2022-03-07 NOTE — Patient Instructions (Addendum)
You had the flu shot today  Please continue all other medications as before, and refills have been done if requested.  Please have the pharmacy call with any other refills you may need.  Please continue your efforts at being more active, low cholesterol diet, and weight control..  Please keep your appointments with your specialists as you may have planned - the 3 mo follow up with GYN  Please make an Appointment to return in 6 months, or sooner if needed

## 2022-03-07 NOTE — Assessment & Plan Note (Signed)
With possible small complex cyst left ovary, pt will f/u with GYN a planned at 3 mo

## 2022-03-07 NOTE — Assessment & Plan Note (Signed)
Lab Results  Component Value Date   HGBA1C 5.5 09/12/2021   Stable, pt to continue current medical treatment - diet, wt control, excercise

## 2022-03-07 NOTE — Assessment & Plan Note (Signed)
BP Readings from Last 3 Encounters:  03/07/22 122/74  02/23/22 130/85  09/20/21 122/72   Stable, pt to continue medical treatment lopressor 25 1/2 bid, diovan hct 160-12.5 qd, but not taking the amlodipine 2.5 mg (has bottle at home) as BP seemed to improve on its own after cardiology visit earlier this yr

## 2022-03-07 NOTE — Assessment & Plan Note (Addendum)
Lab Results  Component Value Date   LDLCALC 68 08/17/2021   Stable, pt to continue current statin zetia 10, nexletol 180 qd, lipitor 10 qd

## 2022-03-13 ENCOUNTER — Ambulatory Visit: Payer: Medicare Other | Admitting: Internal Medicine

## 2022-03-19 ENCOUNTER — Ambulatory Visit: Payer: Medicare Other | Attending: Physician Assistant | Admitting: Physician Assistant

## 2022-03-19 ENCOUNTER — Ambulatory Visit: Payer: Medicare Other | Admitting: Physician Assistant

## 2022-03-19 VITALS — BP 144/80 | HR 65 | Ht 63.5 in | Wt 157.8 lb

## 2022-03-19 DIAGNOSIS — E785 Hyperlipidemia, unspecified: Secondary | ICD-10-CM

## 2022-03-19 DIAGNOSIS — I10 Essential (primary) hypertension: Secondary | ICD-10-CM | POA: Diagnosis not present

## 2022-03-19 DIAGNOSIS — I2510 Atherosclerotic heart disease of native coronary artery without angina pectoris: Secondary | ICD-10-CM

## 2022-03-19 NOTE — Progress Notes (Unsigned)
Cardiology Office Note:    Date:  03/21/2022   ID:  Heather Merritt, DOB 10-May-1955, MRN [id number]  PCP:  Biagio Borg, MD   [facility] Cardiologist:  Quay Burow, MD     Referring MD: Biagio Borg, MD   Chief Complaint  Patient presents with   Follow-up    Seen for Dr. Gwenlyn Found    History of Present Illness:    Heather Merritt is a 66 y.o. female with a hx of hypertension, hyperlipidemia, hiatal hernia, anxiety, GERD and CAD.  Patient presented to the hospital on 10/30/2018 with chest pain.  He was ruled in for NSTEMI by troponin.  Coronary CT obtained on 7/4 showed severe stenosis in mid RCA.  Echocardiogram obtained on 10/06/2018 showed EF 55 to 60%.  Cardiac catheterization performed on the same day showed 90% second diagonal lesion, 95% proximal RCA lesion treated with a 2.5 x 20 mm Synergy DES, EF 55 to 65%.  The diagonal artery was fairly small and was treated medically.  Postprocedure, patient was placed on aspirin and Brilinta.  Brilinta was later switched to Plavix due to allergy.  She was intolerant of statins and it was allergic to Repatha.  She was maintained low-dose Lipitor and Zetia.  She is being followed by Dr. Debara Pickett of lipid clinic and was later started on Nexletol.  Patient was seen in the ED in April 2023 due to hypertension in the setting of increased restaurant food.  She was ruled out of STEMI and was discharged to follow-up with cardiology service.  She was seen in April 2023 due to dizziness, orthostatic vital sign was negative.  Renal artery duplex obtained on 08/07/2021 showed no evidence of renal artery stenosis.  Patient was last seen by Dr. Gwenlyn Found in June 2023 at which time she continued to do well without any exertional chest pain.  Patient presents today for follow-up.  She denies any recent exertional chest pain or worsening dyspnea.  Her blood pressure is borderline elevated today, however it was very much normal 2 weeks ago in PCPs  office.  She says her blood pressure is usually very well-controlled.  She never started on amlodipine that was given to her in April.  I will remove amlodipine from her medication list.  She is still on aspirin and Plavix and questions if she need to be on both medications.  She does not have any bleeding issue.  I will check with Dr. Gwenlyn Found to see if he prefer to stop aspirin or Plavix in her case.  Overall, she is doing quite well and can follow-up in 6 months.  Past Medical History:  Diagnosis Date   Allergy    SEASONAL   ANXIETY 10/28/2007   Arthritis    CAD (coronary artery disease), native coronary artery    a. Cath 10/06/2018 - S/p DES to [facility]; medical therapy for high grade ostial/pro 2nd digonal    Cataracts, bilateral    immature   Chronic back pain    Coronary artery disease    DIVERTICULOSIS, COLON 10/28/2007   Dry skin    red patch on right knee area   GERD 10/28/2007   takes Prevacid daily   H/O hiatal hernia    HLD (hyperlipidemia) 12/12/2017   Hx of colonic polyps    HYPERLIPIDEMIA 10/28/2007   unable to take meds d/t allergies   HYPERTENSION 10/28/2007   takes Diovan daily   Internal hemorrhoids    Irritable bowel syndrome (  IBS)    Joint pain    Joint swelling    LIVER FUNCTION TESTS, ABNORMAL, HX OF 10/29/2007   Lumbar disc disease    Migraine    Myocardial infarction ([place])    Obesity    OSTEOPENIA 10/29/2007   Osteoporosis    Presbyacusis 01/04/2010   Rosacea    Seasonal allergies    takes Clarinex daily   Vertigo    hx of   Vitamin D deficiency    takes Vit D daily    Past Surgical History:  Procedure Laterality Date   ANTERIOR CERVICAL DECOMP/DISCECTOMY FUSION  08/02/2011   Procedure: ANTERIOR CERVICAL DECOMPRESSION/DISCECTOMY FUSION 3 LEVELS;  Surgeon: Elaina Hoops, MD;  Location: [facility];  Service: Neurosurgery;  Laterality: N/A;  Cervical Three-Four, Cervical Four-Five, Cervical Five-Six  Anterior Cervical Decompression Fusion    CARDIAC  CATHETERIZATION     COLONOSCOPY  2001/2013   CORONARY STENT INTERVENTION N/A 10/06/2018   Procedure: CORONARY STENT INTERVENTION;  Surgeon: Lorretta Harp, MD;  Location: [facility];  Service: Cardiovascular;  Laterality: N/A;   ESOPHAGOGASTRODUODENOSCOPY     LEFT HEART CATH AND CORONARY ANGIOGRAPHY N/A 10/06/2018   Procedure: LEFT HEART CATH AND CORONARY ANGIOGRAPHY;  Surgeon: Lorretta Harp, MD;  Location: [facility];  Service: Cardiovascular;  Laterality: N/A;   POLYPECTOMY     TONSILLECTOMY     TONSILLECTOMY  at age 37    Current Medications: Current Meds  Medication Sig   aspirin 81 MG tablet Take 81 mg by mouth daily.   atorvastatin (LIPITOR) 10 MG tablet TAKE 1 TABLET EVERY DAY   Calcium Citrate (CITRACAL PO) Take 2 tablets by mouth daily.    Cholecalciferol (VITAMIN D) 2000 UNITS CAPS Take 2,000 Units by mouth daily.   clopidogrel (PLAVIX) 75 MG tablet TAKE 1 TABLET EVERY DAY   esomeprazole (NEXIUM) 40 MG capsule TAKE 1 CAPSULE EVERY DAY   ezetimibe (ZETIA) 10 MG tablet TAKE 1 TABLET EVERY DAY   famotidine (PEPCID) 40 MG tablet TAKE 1 TABLET EVERY DAY   metoprolol tartrate (LOPRESSOR) 25 MG tablet TAKE 1/2 TABLET TWICE DAILY   metroNIDAZOLE (METROGEL) 0.75 % gel Apply 1 application topically at bedtime.   Multiple Vitamin (MULITIVITAMIN WITH MINERALS) TABS Take 1 tablet by mouth daily.   NEXLETOL 180 MG TABS TAKE 1 TABLET EVERY DAY   nitroGLYCERIN (NITROSTAT) 0.4 MG SL tablet Place 1 tablet (0.4 mg total) under the tongue every 5 (five) minutes x 3 doses as needed for chest pain.   pimecrolimus (ELIDEL) 1 % cream Apply 1 application topically every morning.   valsartan-hydrochlorothiazide (DIOVAN-HCT) 160-12.5 MG tablet TAKE 1 TABLET EVERY DAY     Allergies:   Penicillins, Rosuvastatin, Simvastatin, Statins, Sulfonamide derivatives, Evolocumab, Misc. sulfonamide containing compounds, Other, Penicillin v potassium, Brilinta [ticagrelor], Doxycycline,  Doxycycline hyclate, and Sulfa antibiotics   Social History   Socioeconomic History   Marital status: Divorced    Spouse name: Not on file   Number of children: 0   Years of education: 12   Highest education level: High school graduate  Occupational History   Not on file  Tobacco Use   Smoking status: Former    Types: Cigarettes   Smokeless tobacco: Never   Tobacco comments:    quit in 22yr ago  Vaping Use   Vaping Use: Never used  Substance and Sexual Activity   Alcohol use: Not Currently    Alcohol/week: 0.0 standard drinks of alcohol  Comment: rare   Drug use: No   Sexual activity: Yes    Birth control/protection: Post-menopausal  Other Topics Concern   Not on file  Social History Narrative   Not on file   Social Determinants of Health   Financial Resource Strain: Low Risk  (12/30/2018)   Overall Financial Resource Strain (CARDIA)    Difficulty of Paying Living Expenses: Not hard at all  Food Insecurity: No Food Insecurity (12/30/2018)   Hunger Vital Sign    Worried About Running Out of Food in the Last Year: Never true    Ran Out of Food in the Last Year: Never true  Transportation Needs: No Transportation Needs (12/30/2018)   PRAPARE - Hydrologist (Medical): No    Lack of Transportation (Non-Medical): No  Physical Activity: Sufficiently Active (12/30/2018)   Exercise Vital Sign    Days of Exercise per Week: 5 days    Minutes of Exercise per Session: 30 min  Stress: No Stress Concern Present (12/30/2018)   [place]    Feeling of Stress : Only a little  Social Connections: Moderately Isolated (12/30/2018)   Social Connection and Isolation Panel [NHANES]    Frequency of Communication with Friends and Family: Three times a week    Frequency of Social Gatherings with Friends and Family: Never    Attends Religious Services: 1 to 4 times per year    Active Member of  [employer] or Organizations: No    Attends Music therapist: Never    Marital Status: Divorced     Family History: The patient's family history includes Atrial fibrillation in her father and mother; Diabetes in her maternal grandmother; Heart disease in her mother; Hyperlipidemia in her mother; Hypothyroidism in her maternal grandmother and sister; Stroke in her mother. There is no history of Anesthesia problems, Hypotension, Malignant hyperthermia, Pseudochol deficiency, Colon cancer, Stomach cancer, Rectal cancer, or Esophageal cancer.  ROS:   Please see the history of present illness.     All other systems reviewed and are negative.  EKGs/Labs/Other Studies Reviewed:    The following studies were reviewed today:  Echo 10/06/2018  1. The left ventricle has normal systolic function, with an ejection  fraction of 55-60%. The cavity size was normal. Left ventricular diastolic  parameters were normal. No evidence of left ventricular regional wall  motion abnormalities.   2. The right ventricle has normal systolic function. The cavity was  normal. There is no increase in right ventricular wall thickness. Right  ventricular systolic pressure could not be assessed.   3. No evidence of mitral valve stenosis.   4. The aortic valve is tricuspid. No stenosis of the aortic valve.   5. The aortic root and aortic arch are normal in size and structure.   EKG:  EKG is ordered today.  The ekg ordered today demonstrates normal sinus rhythm, poor progression in the anterior leads.  Recent Labs: 09/12/2021: ALT 32; BUN 16; Creatinine, Ser 0.78; Hemoglobin 14.0; Platelets 292.0; Potassium 4.5; Sodium 139; TSH 1.62  Recent Lipid Panel    Component Value Date/Time   CHOL 136 08/17/2021 0851   TRIG 122 08/17/2021 0851   HDL 46 08/17/2021 0851   CHOLHDL 3.0 08/17/2021 0851   CHOLHDL 4 09/12/2020 1217   VLDL 30.4 09/12/2020 1217   LDLCALC 68 08/17/2021 0851   LDLDIRECT 177.0 06/05/2017 1301      Risk Assessment/Calculations:  Physical Exam:    VS:  BP (!) 144/80   Pulse 65   Ht 5' 3.5" (1.613 m)   Wt 157 lb 12.8 oz (71.6 kg)   SpO2 96%   BMI 27.51 kg/m        Wt Readings from Last 3 Encounters:  03/19/22 157 lb 12.8 oz (71.6 kg)  03/07/22 155 lb (70.3 kg)  02/23/22 156 lb (70.8 kg)     GEN:  Well nourished, well developed in no acute distress HEENT: Normal NECK: No JVD; No carotid bruits LYMPHATICS: No lymphadenopathy CARDIAC: RRR, no murmurs, rubs, gallops RESPIRATORY:  Clear to auscultation without rales, wheezing or rhonchi  ABDOMEN: Soft, non-tender, non-distended MUSCULOSKELETAL:  No edema; No deformity  SKIN: Warm and dry NEUROLOGIC:  Alert and oriented x 3 PSYCHIATRIC:  Normal affect   ASSESSMENT:    1. Coronary artery disease involving native coronary artery of native heart without angina pectoris   2. Essential hypertension   3. Hyperlipidemia LDL goal <70    PLAN:    In order of problems listed above:  CAD: Denies any recent chest pain.  Continue on aspirin and Lipitor  Hypertension: Blood pressure mildly elevated today, however it was normal 2 weeks ago at PCPs office.  Will hold off on adjusting blood pressure medication  Hyperlipidemia: On Lipitor.           Medication Adjustments/Labs and Tests Ordered: Current medicines are reviewed at length with the patient today.  Concerns regarding medicines are outlined above.  Orders Placed This Encounter  Procedures   EKG 12-Lead   No orders of the defined types were placed in this encounter.   Patient Instructions  Medication Instructions:  No Changes *If you need a refill on your cardiac medications before your next appointment, please call your pharmacy*   Lab Work: No labs If you have labs (blood work) drawn today and your tests are completely normal, you will receive your results only by: [facility] (if you have MyChart) OR A paper copy in the mail If  you have any lab test that is abnormal or we need to change your treatment, we will call you to review the results.   Testing/Procedures: No Testing   Follow-Up: At [facility], you and your health needs are our priority.  As part of our continuing mission to provide you with exceptional heart care, we have created designated Provider Care Teams.  These Care Teams include your primary Cardiologist (physician) and Advanced Practice Providers (APPs -  Physician Assistants and Nurse Practitioners) who all work together to provide you with the care you need, when you need it.  We recommend signing up for the patient portal called "MyChart".  Sign up information is provided on this After Visit Summary.  MyChart is used to connect with patients for Virtual Visits (Telemedicine).  Patients are able to view lab/test results, encounter notes, upcoming appointments, etc.  Non-urgent messages can be sent to your provider as well.   To learn more about what you can do with MyChart, go to [email or web address].    Your next appointment:   6 month(s)  The format for your next appointment:   In Person  Provider:   Quay Burow, MD     Signed, Almyra Deforest, [place]  03/21/2022 3:41 PM    [facility]

## 2022-03-19 NOTE — Patient Instructions (Signed)
Medication Instructions:  No Changes *If you need a refill on your cardiac medications before your next appointment, please call your pharmacy*   Lab Work: No labs If you have labs (blood work) drawn today and your tests are completely normal, you will receive your results only by: [facility] (if you have MyChart) OR A paper copy in the mail If you have any lab test that is abnormal or we need to change your treatment, we will call you to review the results.   Testing/Procedures: No Testing   Follow-Up: At [facility], you and your health needs are our priority.  As part of our continuing mission to provide you with exceptional heart care, we have created designated Provider Care Teams.  These Care Teams include your primary Cardiologist (physician) and Advanced Practice Providers (APPs -  Physician Assistants and Nurse Practitioners) who all work together to provide you with the care you need, when you need it.  We recommend signing up for the patient portal called "MyChart".  Sign up information is provided on this After Visit Summary.  MyChart is used to connect with patients for Virtual Visits (Telemedicine).  Patients are able to view lab/test results, encounter notes, upcoming appointments, etc.  Non-urgent messages can be sent to your provider as well.   To learn more about what you can do with MyChart, go to [email or web address].    Your next appointment:   6 month(s)  The format for your next appointment:   In Person  Provider:   Quay Burow, MD

## 2022-03-21 ENCOUNTER — Encounter: Payer: Self-pay | Admitting: Physician Assistant

## 2022-04-04 ENCOUNTER — Ambulatory Visit (INDEPENDENT_AMBULATORY_CARE_PROVIDER_SITE_OTHER): Payer: Medicare Other | Admitting: *Deleted

## 2022-04-04 DIAGNOSIS — Z23 Encounter for immunization: Secondary | ICD-10-CM

## 2022-05-01 DIAGNOSIS — N83292 Other ovarian cyst, left side: Secondary | ICD-10-CM | POA: Insufficient documentation

## 2022-05-14 ENCOUNTER — Encounter: Payer: Self-pay | Admitting: Cardiovascular Disease

## 2022-05-15 NOTE — Addendum Note (Signed)
Addended by: Beatrix Fetters on: 05/15/2022 10:30 AM   Modules accepted: Orders

## 2022-06-25 ENCOUNTER — Other Ambulatory Visit: Payer: Self-pay | Admitting: *Deleted

## 2022-06-25 DIAGNOSIS — E782 Mixed hyperlipidemia: Secondary | ICD-10-CM

## 2022-08-02 ENCOUNTER — Other Ambulatory Visit: Payer: Self-pay | Admitting: Internal Medicine

## 2022-08-06 ENCOUNTER — Encounter (HOSPITAL_BASED_OUTPATIENT_CLINIC_OR_DEPARTMENT_OTHER): Payer: Self-pay | Admitting: Urology

## 2022-08-06 ENCOUNTER — Emergency Department (HOSPITAL_BASED_OUTPATIENT_CLINIC_OR_DEPARTMENT_OTHER)
Admission: EM | Admit: 2022-08-06 | Discharge: 2022-08-06 | Disposition: A | Discharge status: Home / Self Care | Payer: Medicare Other | Attending: Emergency Medicine | Admitting: Emergency Medicine

## 2022-08-06 ENCOUNTER — Emergency Department (HOSPITAL_BASED_OUTPATIENT_CLINIC_OR_DEPARTMENT_OTHER): Payer: Medicare Other

## 2022-08-06 DIAGNOSIS — R208 Other disturbances of skin sensation: Secondary | ICD-10-CM | POA: Diagnosis not present

## 2022-08-06 DIAGNOSIS — R079 Chest pain, unspecified: Secondary | ICD-10-CM | POA: Diagnosis present

## 2022-08-06 LAB — CBC
HCT: 39.4 % (ref 36.0–46.0)
Hemoglobin: 13.1 g/dL (ref 12.0–15.0)
MCH: 30.6 pg (ref 26.0–34.0)
MCHC: 33.2 g/dL (ref 30.0–36.0)
MCV: 92.1 fL (ref 80.0–100.0)
Platelets: 261 10*3/uL (ref 150–400)
RBC: 4.28 MIL/uL (ref 3.87–5.11)
RDW: 12.1 % (ref 11.5–15.5)
WBC: 4.3 10*3/uL (ref 4.0–10.5)
nRBC: 0 % (ref 0.0–0.2)

## 2022-08-06 LAB — BASIC METABOLIC PANEL
Anion gap: 8 (ref 5–15)
BUN: 22 mg/dL (ref 8–23)
CO2: 30 mmol/L (ref 22–32)
Calcium: 9.2 mg/dL (ref 8.9–10.3)
Chloride: 99 mmol/L (ref 98–111)
Creatinine, Ser: 0.65 mg/dL (ref 0.44–1.00)
GFR, Estimated: >60 mL/min (ref >60)
Glucose, Bld: 95 mg/dL (ref 70–99)
Potassium: 4.3 mmol/L (ref 3.5–5.1)
Sodium: 137 mmol/L (ref 135–145)

## 2022-08-06 LAB — TROPONIN I (HIGH SENSITIVITY)
Troponin I (High Sensitivity): <2 ng/L (ref <18)
Troponin I (High Sensitivity): <2 ng/L (ref <18)

## 2022-08-06 MED ORDER — FAMOTIDINE 20 MG PO TABS
20.0000 mg | ORAL_TABLET | Freq: Once | ORAL | Status: AC
  Administered 2022-08-06: 20 mg via ORAL
  Filled 2022-08-06: qty 1

## 2022-08-06 MED ORDER — ALUM & MAG HYDROXIDE-SIMETH 200-200-20 MG/5ML PO SUSP
15.0000 mL | Freq: Once | ORAL | Status: AC
  Administered 2022-08-06: 15 mL via ORAL
  Filled 2022-08-06: qty 30

## 2022-08-06 NOTE — ED Triage Notes (Signed)
Pt states started having pain in shoulder blades this am, moved to chest, over the past hour is having a burning pain between shoulder blades   H/o MI in 2020 with stent placed

## 2022-08-06 NOTE — Discharge Instructions (Addendum)
Note the workup today was overall reassuring.  As discussed, it does not look like you are having a heart attack.  I have talked to your cardiologist who recommended outpatient follow-up.  Please do not hesitate to return to emergency department for worrisome signs and symptoms we discussed become apparent.

## 2022-08-06 NOTE — ED Provider Notes (Signed)
[facility] Provider Note   CSN: [id number] Arrival date & time: 08/06/22  1357     History  Chief Complaint  Patient presents with   Back Pain    Heather Merritt is a 67 y.o. female.  HPI   67 year old female presents emergency department with complaints of chest and back pain.  Patient states that she began with pain in between her shoulder blades that was severe which lasted a matter of minutes before resolving.  She then subsequently noted left anterior chest pain that was similar nature before spontaneous resolution.  She states that she was playing cards at her friend's house approximately 1 hour ago when the burning type sensation returned between her shoulder blades.  She states that is persistent since onset.  States he has a history of MI in 2020 with only symptom being burning sensation on the front part of her chest; she is concerned about the same.  Denies fever, cough, shortness of breath, abdominal pain, nausea, vomiting, urinary symptoms, change in bowel habits.  Past medical history significant for hyperlipidemia, hypertension, coronary artery disease, MI, IBS, GERD  Home Medications Prior to Admission medications   Medication Sig Start Date End Date Taking? Authorizing Provider  aspirin 81 MG tablet Take 81 mg by mouth daily.    [provider]  atorvastatin (LIPITOR) 10 MG tablet TAKE 1 TABLET EVERY DAY 01/31/22   Hilty, Lisette Abu, MD  Calcium Citrate (CITRACAL PO) Take 2 tablets by mouth daily.     [provider]  Cholecalciferol (VITAMIN D) 2000 UNITS CAPS Take 2,000 Units by mouth daily.    [provider]  esomeprazole (NEXIUM) 40 MG capsule TAKE 1 CAPSULE EVERY DAY 10/30/21   Corwin Levins, MD  ezetimibe (ZETIA) 10 MG tablet TAKE 1 TABLET EVERY DAY 01/31/22   Hilty, Lisette Abu, MD  famotidine (PEPCID) 40 MG tablet TAKE 1 TABLET EVERY DAY 08/09/21   Esterwood, Amy S, PA-C  metoprolol tartrate  (LOPRESSOR) 25 MG tablet TAKE 1/2 TABLET TWICE DAILY 01/31/22   Corwin Levins, MD  metroNIDAZOLE (METROGEL) 0.75 % gel Apply 1 application topically at bedtime. 06/11/17   Corwin Levins, MD  Multiple Vitamin (MULITIVITAMIN WITH MINERALS) TABS Take 1 tablet by mouth daily.    [provider]  NEXLETOL 180 MG TABS TAKE 1 TABLET EVERY DAY 11/03/21   Hilty, Lisette Abu, MD  nitroGLYCERIN (NITROSTAT) 0.4 MG SL tablet Place 1 tablet (0.4 mg total) under the tongue every 5 (five) minutes x 3 doses as needed for chest pain. 04/07/21   Runell Gess, MD  pimecrolimus (ELIDEL) 1 % cream Apply 1 application topically every morning. 06/11/17   Corwin Levins, MD  valsartan-hydrochlorothiazide (DIOVAN-HCT) 160-12.5 MG tablet TAKE 1 TABLET EVERY DAY 01/31/22   Corwin Levins, MD      Allergies    Penicillins, Rosuvastatin, Simvastatin, Statins, Sulfonamide derivatives, Evolocumab, Misc. sulfonamide containing compounds, Other, Penicillin v potassium, Brilinta [ticagrelor], Doxycycline, Doxycycline hyclate, and Sulfa antibiotics    Review of Systems   Review of Systems  All other systems reviewed and are negative.   Physical Exam Updated Vital Signs BP 123/62   Pulse 75   Temp 97.7 F (36.5 C)   Resp 17   Ht 5\' 4"  (1.626 m)   Wt 71.6 kg   SpO2 100%   BMI 27.09 kg/m  Physical Exam Vitals and nursing note reviewed.  Constitutional:  General: She is not in acute distress.    Appearance: She is well-developed.  HENT:     Head: Normocephalic and atraumatic.  Eyes:     Conjunctiva/sclera: Conjunctivae normal.  Cardiovascular:     Rate and Rhythm: Normal rate and regular rhythm.     Pulses: Normal pulses.  Pulmonary:     Effort: Pulmonary effort is normal. No respiratory distress.     Breath sounds: Normal breath sounds.  Abdominal:     Palpations: Abdomen is soft.     Tenderness: There is no abdominal tenderness.  Musculoskeletal:        General: No swelling.     Cervical back: Neck  supple.     Right lower leg: No edema.     Left lower leg: No edema.  Skin:    General: Skin is warm and dry.     Capillary Refill: Capillary refill takes less than 2 seconds.  Neurological:     Mental Status: She is alert.  Psychiatric:        Mood and Affect: Mood normal.     ED Results / Procedures / Treatments   Labs (all labs ordered are listed, but only abnormal results are displayed) Labs Reviewed  BASIC METABOLIC PANEL  CBC  TROPONIN I (HIGH SENSITIVITY)  TROPONIN I (HIGH SENSITIVITY)    EKG EKG Interpretation  Date/Time:  Monday Aug 06 2022 14:06:11 EDT Ventricular Rate:  65 PR Interval:  145 QRS Duration: 86 QT Interval:  415 QTC Calculation: 432 R Axis:   78 Text Interpretation: Sinus rhythm Anterior infarct, old No significant change since last tracing Confirmed by Melene Plan [phone]) on 08/06/2022 3:03:47 PM  Radiology DG Chest 2 View  Result Date: 08/06/2022 CLINICAL DATA:  Back pain between shoulder blades since this morning EXAM: CHEST - 2 VIEW COMPARISON:  01/11/2020 FINDINGS: Normal heart size, mediastinal contours, and pulmonary vascularity. Atherosclerotic calcification aorta. Lungs clear. No pulmonary infiltrate, pleural effusion, or pneumothorax. Osseous structures unremarkable. IMPRESSION: No acute abnormalities. Aortic Atherosclerosis (ICD10-I70.0). Electronically Signed   By: Ulyses Southward M.D.   On: 08/06/2022 14:30    Procedures Procedures    Medications Ordered in ED Medications  famotidine (PEPCID) tablet 20 mg (20 mg Oral Given 08/06/22 1504)  alum & mag hydroxide-simeth (MAALOX/MYLANTA) 200-200-20 MG/5ML suspension 15 mL (15 mLs Oral Given 08/06/22 1504)    ED Course/ Medical Decision Making/ A&P Clinical Course as of 08/06/22 1733  Mon Aug 06, 2022  1511 Elon Spanner of cardiology recommended follow-up outpatient if second troponin negative. [CR]  1726 Reevaluation of the patient showed improvement in symptoms after GI cocktail.   Second opponent negative.  Will discharge with outpatient follow-up with cardiology. [CR]    Clinical Course User Index [CR] Peter Garter, PA                             Medical Decision Making Amount and/or Complexity of Data Reviewed Labs: ordered. Radiology: ordered.  Risk OTC drugs.   This patient presents to the ED for concern of chest/back pain, this involves an extensive number of treatment options, and is a complaint that carries with it a high risk of complications and morbidity.  The differential diagnosis includes ACS, PE, pneumothorax, aortic dissection, aortic aneurysm, musculoskeletal, fracture, dislocation, strain/sprain   Co morbidities that complicate the patient evaluation  See HPI   Additional history obtained:  Additional history obtained from EMR External records from outside  source obtained and reviewed including hospital records   Lab Tests:  I Ordered, and personally interpreted labs.  The pertinent results include: No leukocytosis noted.  No evidence of anemia.  Platelets within normal range.  No electrolyte abnormalities.  No renal dysfunction.  Initial troponin of less than 2 with repeat less than 2   Imaging Studies ordered:  I ordered imaging studies including chest x-ray I independently visualized and interpreted imaging which showed no acute cardiopulmonary abnormalities I agree with the radiologist interpretation   Cardiac Monitoring: / EKG:  The patient was maintained on a cardiac monitor.  I personally viewed and interpreted the cardiac monitored which showed an underlying rhythm of: Send this rhythm with evidence of old anterior infarct but otherwise, without acute abnormality   Consultations Obtained:  See ED course  Problem List / ED Course / Critical interventions / Medication management  Burning sensation along back I ordered medication including Maalox, Pepcid  Reevaluation of the patient after these medicines showed  that the patient improved I have reviewed the patients home medicines and have made adjustments as needed   Social Determinants of Health:  Former cigarette use.  Denies illicit drug use   Test / Admission - Considered:  Burning sensation Vitals signs within normal range and stable throughout visit. Laboratory/imaging studies significant for: See above 67 year old female presents emergency department with complaints of burning sensation in back.  Given similarity of symptoms from prior MI, ACS rule out was conducted.  Low suspicion for ACS given delta negative troponin, lack of acute ischemic changes on EKG.  Patient did note significant improvement of symptoms with administration of GI cocktail.  Could be related to GERD.  Doubt PE given lack of pleuritic type nature, tachycardia, shortness of breath.  Doubt pneumothorax.  Doubt aortic dissection.  Patient recommended follow-up with cardiology outpatient for further assessment of symptoms per recommendation by consultation.  Treatment plan discussed at length with patient and she acknowledged understanding was agreeable to said plan. Worrisome signs and symptoms were discussed with the patient, and the patient acknowledged understanding to return to the ED if noticed. Patient was stable upon discharge.          Final Clinical Impression(s) / ED Diagnoses Final diagnoses:  Burning sensation    Rx / DC Orders ED Discharge Orders     None         Peter Garter, PA 08/06/22 1733    Melene Plan, DO 08/07/22 [phone]

## 2022-08-07 ENCOUNTER — Telehealth: Payer: Self-pay

## 2022-08-07 ENCOUNTER — Ambulatory Visit: Payer: Medicare Other | Attending: Cardiovascular Disease | Admitting: Cardiovascular Disease

## 2022-08-07 ENCOUNTER — Encounter: Payer: Self-pay | Admitting: Cardiovascular Disease

## 2022-08-07 ENCOUNTER — Telehealth: Payer: Self-pay | Admitting: Physician Assistant

## 2022-08-07 VITALS — BP 132/82 | HR 62 | Ht 63.5 in | Wt 153.6 lb

## 2022-08-07 DIAGNOSIS — I214 Non-ST elevation (NSTEMI) myocardial infarction: Secondary | ICD-10-CM | POA: Insufficient documentation

## 2022-08-07 DIAGNOSIS — I10 Essential (primary) hypertension: Secondary | ICD-10-CM | POA: Insufficient documentation

## 2022-08-07 DIAGNOSIS — E782 Mixed hyperlipidemia: Secondary | ICD-10-CM | POA: Insufficient documentation

## 2022-08-07 NOTE — Assessment & Plan Note (Signed)
History of essential hypertension blood pressure measured today at 132/82.  She is on metoprolol, Diovan and hydrochlorothiazide.

## 2022-08-07 NOTE — Patient Instructions (Signed)
Medication Instructions:  Your physician recommends that you continue on your current medications as directed. Please refer to the Current Medication list given to you today.  *If you need a refill on your cardiac medications before your next appointment, please call your pharmacy*   Follow-Up: At [facility], you and your health needs are our priority.  As part of our continuing mission to provide you with exceptional heart care, we have created designated Provider Care Teams.  These Care Teams include your primary Cardiologist (physician) and Advanced Practice Providers (APPs -  Physician Assistants and Nurse Practitioners) who all work together to provide you with the care you need, when you need it.  We recommend signing up for the patient portal called "MyChart".  Sign up information is provided on this After Visit Summary.  MyChart is used to connect with patients for Virtual Visits (Telemedicine).  Patients are able to view lab/test results, encounter notes, upcoming appointments, etc.  Non-urgent messages can be sent to your provider as well.   To learn more about what you can do with MyChart, go to [email or web address].    Your next appointment:   6 month(s)  Provider:   Hao Meng, PA-C      Then, Jonathan Berry, MD will plan to see you again in 12 month(s).   

## 2022-08-07 NOTE — Progress Notes (Signed)
08/07/2022 Heather Merritt   09/20/1955  [id number]  Primary Physician Corwin Levins, MD Primary Cardiologist: Runell Gess MD Nicholes Calamity, [place]  HPI:  Heather Merritt is a 67 y.o.   moderately overweight divorced Caucasian female no children who works as a Investment banker, operational.  I last saw her in the office 09/21/2018 Heather Merritt.  I first met her during her hospitalization in July 2020 for non-STEMI.  Her risk factors include hyperlipidemia intolerant to statin therapy, treated hypertension and family history with a mother who had PCI at age 76.  She is not a smoker.  She had chest pain on 10/03/2018 with positive enzymes.  Coronary CTA showed significant mid to distal dominant RCA stenosis.  I performed radial diagnostic cath on her 10/06/2018 revealing high-grade mid RCA stenosis.  I deployed a synergy 2.5 x 20 mm long drug-eluting stent post dilating with a 2.75 mm noncompliant balloon up to 2.8 mm.  She did have an ostial diagonal branch stenosis with normal LV function.  She was discharged home on 10/07/2018 on aspirin and Brilinta.  Unfortunately she could not tolerate Brilinta and was transitioned to clopidogrel.  She is also apparently statin intolerant and is seeing Dr. Rennis Golden for treatment of hyperlipidemia.  She is currently on low-dose atorvastatin and Zetia .  She apparently was tried on Repatha but was allergic to it.   Since I saw her a year ago she continues to do well.  She still fairly active.  She has seen Dr. Rennis Golden in the lipid clinic for further management given her mild statin intolerance currently on Nexletol with excellent lipid profile.  She denies chest pain or shortness of breath.  She was seen in the ER yesterday and was evaluated by Dr. Melene Plan for atypical back pain that she was concerned may be her anginal equivalent although the workup was unrevealing.   Current Meds  Medication Sig   aspirin 81 MG tablet Take 81 mg by mouth daily.   atorvastatin (LIPITOR)  10 MG tablet TAKE 1 TABLET EVERY DAY   Calcium Citrate (CITRACAL PO) Take 2 tablets by mouth daily.    Cholecalciferol (VITAMIN D) 2000 UNITS CAPS Take 2,000 Units by mouth daily.   clopidogrel (PLAVIX) 75 MG tablet Take 75 mg by mouth daily.   esomeprazole (NEXIUM) 40 MG capsule TAKE 1 CAPSULE EVERY DAY   ezetimibe (ZETIA) 10 MG tablet TAKE 1 TABLET EVERY DAY   famotidine (PEPCID) 40 MG tablet TAKE 1 TABLET EVERY DAY   metoprolol tartrate (LOPRESSOR) 25 MG tablet TAKE 1/2 TABLET TWICE DAILY   metroNIDAZOLE (METROGEL) 0.75 % gel Apply 1 application topically at bedtime.   Multiple Vitamin (MULITIVITAMIN WITH MINERALS) TABS Take 1 tablet by mouth daily.   NEXLETOL 180 MG TABS TAKE 1 TABLET EVERY DAY   pimecrolimus (ELIDEL) 1 % cream Apply 1 application topically every morning.   valsartan-hydrochlorothiazide (DIOVAN-HCT) 160-12.5 MG tablet TAKE 1 TABLET EVERY DAY     Allergies  Allergen Reactions   Penicillins Hives, Itching and Rash    Did it involve swelling of the face/tongue/throat, SOB, or low BP? Unknown Did it involve sudden or severe rash/hives, skin peeling, or any reaction on the inside of your mouth or nose? Unknown Did you need to seek medical attention at a hospital or doctor's office? Unknown When did it last happen?      pt cant recall If all above answers are "NO", may proceed with cephalosporin use.  Rosuvastatin Other (See Comments)    REACTION: muscle weakness   Simvastatin Other (See Comments)    REACTION: muscle weakness   Statins Other (See Comments)    Myalgias and weakness   Sulfonamide Derivatives Other (See Comments)    Nausea, vomiting, abd pain   Evolocumab Swelling and Other (See Comments)    Swelling, itchy eyes, mouth swelling, red/puffy/hot face   Misc. Sulfonamide Containing Compounds Other (See Comments)   Other    Penicillin V Potassium Other (See Comments)   Brilinta [Ticagrelor] Rash    Itchy rash to torso   Doxycycline Itching and Rash    Doxycycline Hyclate Itching and Rash   Sulfa Antibiotics Other (See Comments)    Nausea, vomiting, abd pain    Social History   Socioeconomic History   Marital status: Divorced    Spouse name: Not on file   Number of children: 0   Years of education: 12   Highest education level: High school graduate  Occupational History   Not on file  Tobacco Use   Smoking status: Former    Types: Cigarettes   Smokeless tobacco: Never   Tobacco comments:    quit in 36yrs ago  Vaping Use   Vaping Use: Never used  Substance and Sexual Activity   Alcohol use: Not Currently    Alcohol/week: 0.0 standard drinks of alcohol    Comment: rare   Drug use: No   Sexual activity: Yes    Birth control/protection: Post-menopausal  Other Topics Concern   Not on file  Social History Narrative   Not on file   Social Determinants of Health   Financial Resource Strain: Low Risk  (12/30/2018)   Overall Financial Resource Strain (CARDIA)    Difficulty of Paying Living Expenses: Not hard at all  Food Insecurity: No Food Insecurity (12/30/2018)   Hunger Vital Sign    Worried About Running Out of Food in the Last Year: Never true    Ran Out of Food in the Last Year: Never true  Transportation Needs: No Transportation Needs (12/30/2018)   PRAPARE - Administrator, Civil Service (Medical): No    Lack of Transportation (Non-Medical): No  Physical Activity: Sufficiently Active (12/30/2018)   Exercise Vital Sign    Days of Exercise per Week: 5 days    Minutes of Exercise per Session: 30 min  Stress: No Stress Concern Present (12/30/2018)   [employer] of Occupational Health - Occupational Stress Questionnaire    Feeling of Stress : Only a little  Social Connections: Moderately Isolated (12/30/2018)   Social Connection and Isolation Panel [NHANES]    Frequency of Communication with Friends and Family: Three times a week    Frequency of Social Gatherings with Friends and Family: Never     Attends Religious Services: 1 to 4 times per year    Active Member of [employer] or Organizations: No    Attends Banker Meetings: Never    Marital Status: Divorced  Catering manager Violence: Unknown (12/30/2018)   Humiliation, Afraid, Rape, and Kick questionnaire    Fear of Current or Ex-Partner: Patient declined    Emotionally Abused: Patient declined    Physically Abused: Patient declined    Sexually Abused: Patient declined     Review of Systems: General: negative for chills, fever, night sweats or weight changes.  Cardiovascular: negative for chest pain, dyspnea on exertion, edema, orthopnea, palpitations, paroxysmal nocturnal dyspnea or shortness of breath Dermatological: negative for rash Respiratory: negative  for cough or wheezing Urologic: negative for hematuria Abdominal: negative for nausea, vomiting, diarrhea, bright red blood per rectum, melena, or hematemesis Neurologic: negative for visual changes, syncope, or dizziness All other systems reviewed and are otherwise negative except as noted above.    Blood pressure 132/82, pulse 62, height 5' 3.5" (1.613 m), weight 153 lb 9.6 oz (69.7 kg), SpO2 99 %.  General appearance: alert and no distress Neck: no adenopathy, no carotid bruit, no JVD, supple, symmetrical, trachea midline, and thyroid not enlarged, symmetric, no tenderness/mass/nodules Lungs: clear to auscultation bilaterally Heart: regular rate and rhythm, S1, S2 normal, no murmur, click, rub or gallop Extremities: extremities normal, atraumatic, no cyanosis or edema Pulses: 2+ and symmetric Skin: Skin color, texture, turgor normal. No rashes or lesions Neurologic: Grossly normal  EKG not performed today  ASSESSMENT AND PLAN:   Essential (primary) hypertension History of essential hypertension blood pressure measured today at 132/82.  She is on metoprolol, Diovan and hydrochlorothiazide.  HLD (hyperlipidemia) History of hyperlipidemia intolerant to  statin therapy on low-dose atorvastatin, Zetia and Nexletol followed by Dr. Rennis Golden in the lipid clinic.  Her most recent lipid profile performed 08/17/2021 revealed total cholesterol 136, LDL 68 and HDL 46.  NSTEMI (non-ST elevated myocardial infarction) (HCC) History of CAD status post non-STEMI 10/03/2018.  Catheter radially revealing high-grade mid RCA stenosis which I stented with a 2.5 x 20 mm long Synergy drug-eluting stent postdilated to 2.8 mm.  She did have ostial diagonal marginal branch stenosis which I elected to treat medically.  She had normal LV function.  She was discharged home on aspirin and Brilinta which unfortunately she did not tolerate and will transition to clopidogrel.  She was just seen in the ER yesterday with back pain which she was concerned may be her anginal equivalent although her diagnostic studies were unrevealing.     Runell Gess MD FACP,FACC,FAHA, [facility] 08/07/2022 10:22 AM

## 2022-08-07 NOTE — Assessment & Plan Note (Signed)
History of CAD status post non-STEMI 10/03/2018.  Catheter radially revealing high-grade mid RCA stenosis which I stented with a 2.5 x 20 mm long Synergy drug-eluting stent postdilated to 2.8 mm.  She did have ostial diagonal marginal branch stenosis which I elected to treat medically.  She had normal LV function.  She was discharged home on aspirin and Brilinta which unfortunately she did not tolerate and will transition to clopidogrel.  She was just seen in the ER yesterday with back pain which she was concerned may be her anginal equivalent although her diagnostic studies were unrevealing.

## 2022-08-07 NOTE — Assessment & Plan Note (Signed)
History of hyperlipidemia intolerant to statin therapy on low-dose atorvastatin, Zetia and Nexletol followed by Dr. Rennis Golden in the lipid clinic.  Her most recent lipid profile performed 08/17/2021 revealed total cholesterol 136, LDL 68 and HDL 46.

## 2022-08-07 NOTE — Transitions of Care (Post Inpatient/ED Visit) (Unsigned)
   08/07/2022  Name: Heather Merritt MRN: [id number] DOB: 11/20/1955  Today's TOC FU Call Status: Today's TOC FU Call Status:: Unsuccessul Call (1st Attempt) Unsuccessful Call (1st Attempt) Date: 08/07/22  Attempted to reach the patient regarding the most recent Inpatient/ED visit.  Follow Up Plan: Additional outreach attempts will be made to reach the patient to complete the Transitions of Care (Post Inpatient/ED visit) call.   Signature Agnes Lawrence, CMA (AAMA)  [facility] [phone]

## 2022-08-07 NOTE — Telephone Encounter (Signed)
Patient called to schedule appointment for burning sensation and pain when swallowing. I have scheduled her for the soonest appointment. States that she has been to the ED regarding these problems and would like to see if anything could be done earlier. Please advise. Thank you.

## 2022-08-08 NOTE — Transitions of Care (Post Inpatient/ED Visit) (Signed)
08/08/2022  Name: Heather Merritt MRN: [id number] DOB: 1956-02-17  Today's TOC FU Call Status: Today's TOC FU Call Status:: Successful TOC FU Call Competed Unsuccessful Call (1st Attempt) Date: 08/07/22 [facility] FU Call Complete Date: 08/08/22  Transition Care Management Follow-up Telephone Call Date of Discharge: 08/06/22 Discharge Facility: [facility] Type of Discharge: Emergency Department Reason for ED Visit: Other: (Burning sensation) How have you been since you were released from the hospital?: Better Any questions or concerns?: No  Items Reviewed: Did you receive and understand the discharge instructions provided?: Yes Medications obtained,verified, and reconciled?: Yes (Medications Reviewed) Any new allergies since your discharge?: No Dietary orders reviewed?: NA Do you have support at home?: Yes  Medications Reviewed Today: Medications Reviewed Today     Reviewed by Leigh Aurora, CMA (Certified Medical Assistant) on 08/08/22 at 1759  Med List Status: <None>   Medication Order Taking? Sig Documenting Provider Last Dose Status Informant  aspirin 81 MG tablet [id number] No Take 81 mg by mouth daily. [provider] Taking Active Self  atorvastatin (LIPITOR) 10 MG tablet [id number] No TAKE 1 TABLET EVERY DAY Hilty, Lisette Abu, MD Taking Active   Calcium Citrate (CITRACAL PO) [id number] No Take 2 tablets by mouth daily.  [provider] Taking Active Self  Cholecalciferol (VITAMIN D) 2000 UNITS CAPS [id number] No Take 2,000 Units by mouth daily. [provider] Taking Active Self  clopidogrel (PLAVIX) 75 MG tablet [id number] No Take 75 mg by mouth daily. [provider] Taking Active Self  esomeprazole (NEXIUM) 40 MG capsule [id number] No TAKE 1 CAPSULE EVERY DAY Corwin Levins, MD Taking Active   ezetimibe (ZETIA) 10 MG tablet [id number] No TAKE 1 TABLET EVERY DAY Hilty, Lisette Abu, MD Taking Active   famotidine (PEPCID) 40 MG tablet  [id number] No TAKE 1 TABLET EVERY DAY Esterwood, Amy S, PA-C Taking Active   metoprolol tartrate (LOPRESSOR) 25 MG tablet [id number] No TAKE 1/2 TABLET TWICE DAILY Corwin Levins, MD Taking Active   metroNIDAZOLE (METROGEL) 0.75 % gel [id number] No Apply 1 application topically at bedtime. Corwin Levins, MD Taking Active Self  Multiple Vitamin [facility] WITH MINERALS) TABS [id number] No Take 1 tablet by mouth daily. [provider] Taking Active Self  NEXLETOL 180 MG TABS [id number] No TAKE 1 TABLET EVERY DAY Hilty, Lisette Abu, MD Taking Active   nitroGLYCERIN (NITROSTAT) 0.4 MG SL tablet [id number] No Place 1 tablet (0.4 mg total) under the tongue every 5 (five) minutes x 3 doses as needed for chest pain.  Patient not taking: Reported on 08/07/2022   Runell Gess, MD Not Taking Active            Med Note [facility], REBECCA G   Tue Aug 07, 2022  9:58 AM) Never needed  pimecrolimus (ELIDEL) 1 % cream [id number] No Apply 1 application topically every morning. Corwin Levins, MD Taking Active Self  valsartan-hydrochlorothiazide (DIOVAN-HCT) 160-12.5 MG tablet [id number] No TAKE 1 TABLET EVERY DAY Corwin Levins, MD Taking Active             Home Care and Equipment/Supplies: Were Home Health Services Ordered?: NA Any new equipment or medical supplies ordered?: NA  Functional Questionnaire: Do you need assistance with bathing/showering or dressing?: No Do you need assistance with meal preparation?: No Do you need assistance with eating?: No Do you have difficulty maintaining continence: No Do you need assistance with getting out of bed/getting out of a chair/moving?: No Do you have  difficulty managing or taking your medications?: No  Follow up appointments reviewed: PCP Follow-up appointment confirmed?: NA Specialist Hospital Follow-up appointment confirmed?: Yes Date of Specialist follow-up appointment?: 08/07/22 Follow-Up Specialty Provider:: Dr. Linden Dolin Do you need  transportation to your follow-up appointment?: No Do you understand care options if your condition(s) worsen?: Yes-patient verbalized understanding    SIGNATURE Agnes Lawrence, CMA (AAMA)  [facility] [phone]

## 2022-08-09 NOTE — Telephone Encounter (Signed)
Spoke with the patient. Moved to a sooner appointment on the primary GI schedule. Continue as recommended by ER provider. Patient last seen here 05/05/2020.

## 2022-08-15 LAB — LIPID PANEL
Chol/HDL Ratio: 2.5 ratio (ref 0.0–4.4)
Cholesterol, Total: 147 mg/dL (ref 100–199)
HDL: 59 mg/dL (ref >39)
LDL Chol Calc (NIH): 71 mg/dL (ref 0–99)
Triglycerides: 89 mg/dL (ref 0–149)
VLDL Cholesterol Cal: 17 mg/dL (ref 5–40)

## 2022-08-20 ENCOUNTER — Ambulatory Visit: Payer: Medicare Other | Attending: Internal Medicine | Admitting: Internal Medicine

## 2022-08-20 ENCOUNTER — Telehealth: Payer: Self-pay | Admitting: *Deleted

## 2022-08-20 ENCOUNTER — Encounter: Payer: Self-pay | Admitting: Internal Medicine

## 2022-08-20 VITALS — BP 138/76 | HR 63 | Ht 63.5 in | Wt 153.5 lb

## 2022-08-20 DIAGNOSIS — T466X5D Adverse effect of antihyperlipidemic and antiarteriosclerotic drugs, subsequent encounter: Secondary | ICD-10-CM

## 2022-08-20 DIAGNOSIS — E785 Hyperlipidemia, unspecified: Secondary | ICD-10-CM | POA: Diagnosis present

## 2022-08-20 DIAGNOSIS — T466X5A Adverse effect of antihyperlipidemic and antiarteriosclerotic drugs, initial encounter: Secondary | ICD-10-CM | POA: Diagnosis present

## 2022-08-20 DIAGNOSIS — M791 Myalgia, unspecified site: Secondary | ICD-10-CM

## 2022-08-20 DIAGNOSIS — I2510 Atherosclerotic heart disease of native coronary artery without angina pectoris: Secondary | ICD-10-CM | POA: Diagnosis present

## 2022-08-20 NOTE — Addendum Note (Signed)
Addended by: Johney Frame A on: 08/20/2022 09:26 AM   Modules accepted: Orders

## 2022-08-20 NOTE — Telephone Encounter (Signed)
  Patient Consent for Virtual Visit        Heather Merritt has provided verbal consent on 08/20/2022 for a virtual visit (video or telephone).   CONSENT FOR VIRTUAL VISIT FOR:  Heather Merritt  By participating in this virtual visit I agree to the following:  I hereby voluntarily request, consent and authorize [facility] and its employed or contracted physicians, physician assistants, nurse practitioners or other licensed health care professionals (the Practitioner), to provide me with telemedicine health care services (the "Services") as deemed necessary by the treating Practitioner. I acknowledge and consent to receive the Services by the Practitioner via telemedicine. I understand that the telemedicine visit will involve communicating with the Practitioner through live audiovisual communication technology and the disclosure of certain medical information by electronic transmission. I acknowledge that I have been given the opportunity to request an in-person assessment or other available alternative prior to the telemedicine visit and am voluntarily participating in the telemedicine visit.  I understand that I have the right to withhold or withdraw my consent to the use of telemedicine in the course of my care at any time, without affecting my right to future care or treatment, and that the Practitioner or I may terminate the telemedicine visit at any time. I understand that I have the right to inspect all information obtained and/or recorded in the course of the telemedicine visit and may receive copies of available information for a reasonable fee.  I understand that some of the potential risks of receiving the Services via telemedicine include:  Delay or interruption in medical evaluation due to technological equipment failure or disruption; Information transmitted may not be sufficient (e.g. poor resolution of images) to allow for appropriate medical decision making by the  Practitioner; and/or  In rare instances, security protocols could fail, causing a breach of personal health information.  Furthermore, I acknowledge that it is my responsibility to provide information about my medical history, conditions and care that is complete and accurate to the best of my ability. I acknowledge that Practitioner's advice, recommendations, and/or decision may be based on factors not within their control, such as incomplete or inaccurate data provided by me or distortions of diagnostic images or specimens that may result from electronic transmissions. I understand that the practice of medicine is not an exact science and that Practitioner makes no warranties or guarantees regarding treatment outcomes. I acknowledge that a copy of this consent can be made available to me via my patient portal [facility] MyChart), or I can request a printed copy by calling the office of [facility].    I understand that my insurance will be billed for this visit.   I have read or had this consent read to me. I understand the contents of this consent, which adequately explains the benefits and risks of the Services being provided via telemedicine.  I have been provided ample opportunity to ask questions regarding this consent and the Services and have had my questions answered to my satisfaction. I give my informed consent for the services to be provided through the use of telemedicine in my medical care

## 2022-08-20 NOTE — Progress Notes (Signed)
Virtual Visit via Video Note   Because of Heather Merritt's co-morbid illnesses, she is at least at moderate risk for complications without adequate follow up.  This format is felt to be most appropriate for this patient at this time.  All issues noted in this document were discussed and addressed.  A limited physical exam was performed with this format.  Please refer to the patient's chart for her consent to telehealth for [facility].      Date:  08/20/2022   ID:  JOLINA HOLBROOK, DOB 02-28-1956, MRN [id number] The patient was identified using 2 identifiers.  Evaluation Performed:  Follow-Up Visit  Patient Location:  [address] [place] [facility] [id number]  Provider location:   [address], [address] [place], [place] [address]  PCP:  Corwin Levins, MD  Cardiologist:  Nanetta Batty, MD Electrophysiologist:  None   Chief Complaint:  Follow-up dyslipidemia  History of Present Illness:    SHEVA ODER is a 67 y.o. female who presents via audio/video conferencing for a telehealth visit today.  This is a 68 year old female patient of Dr. Gery Pray who was referred by Azalee Course, PA-C, for management of dyslipidemia.  Unfortunately she recently had chest pain was found to have a non-STEMI.  Cath in July 2020 demonstrated some coronary disease specifically in the right coronary artery requiring a drug-eluting stent placement.  Since then she has done very well without recurrent chest pain.  She started cardiac rehabilitation.  She does have a longstanding history of dyslipidemia including family history of coronary disease.  She had previously been on a number of different statin medications including atorvastatin, rosuvastatin, simvastatin, pravastatin and others with significant myalgias, particularly at the higher doses.  She had been taking atorvastatin 10 mg every other day prior to this event and was convinced to take it on a daily basis afterwards.  So far that seems to  be reasonably well tolerated with some mild myalgias.  She has not had reassessment of her cholesterol since July however her total cholesterol the time was 204, triglycerides 280, HDL 36 and LDL of 112.  05/08/2019  Ms. schwartzman returns today for follow-up.  Overall she is doing fairly well.  Her cholesterol has improved on the Zetia.  LDL is down to 81 from 113.  Total cholesterol is 158 with triglycerides increased to 167.  This is been high in the past.  She thinks is mostly dietary related.  She is on over-the-counter fish oil which I have advised against due to lack of trial data on this.  We discussed further at length today about Vascepa and how this could be a good addition to her regimen because of the significant cardiovascular risk reduction that was seen in the reduce it trial.  09/10/2019  Ms. Neder is seen today in follow-up.  Unfortunately she could not afford Vascepa but did make some dietary changes.  She started taking over-the-counter fish oil.  She noted that her LDL cholesterol is actually gone now up and her triglycerides though have come down.  Most recent lipid showed total cholesterol 171, triglycerides 154 and LDL of 99.  I advised that there was no data for cardiovascular risk reduction with over-the-counter fish oils and I would recommend not taking it.  Since she cannot afford the Vascepa, we will have to focus on better LDL control.  She is only on low-dose atorvastatin due to side effects.  05/30/2020  Ms. Vamos is seen today in follow-up.  Unfortunately she was intolerant of Repatha as well.  She did take 2 doses but had what is described as a immune or allergic response.  She had some throat tightening and facial flushing.  This improved with antihistamine.  She did have significant lipid lowering however on the medication with total cholesterol now 85, HDL 48, triglycerides 111 and LDL 17.  Unfortunately, due to her allergic response, this will not be an option for  her going forward.  10/06/2020  Ms. Estay returns today for follow-up.  She is having issues with Repatha that caused allergic symptoms.  Her cholesterol however did respond as above significantly with LDL down to 17.  Repeat testing about 2 weeks ago showed total cholesterol 172, triglycerides 165, HDL 43 and LDL 100.  She is able to tolerate atorvastatin 10 mg daily but remains above target.  02/13/2021  Ms. Prueter returns today for follow-up.  She reported initially having some GI upset related to [facility] however had stopped it for a while and then restarted it with no issues.  She has remained on it in addition to ezetimibe and low-dose atorvastatin 10 mg daily.  Lipids are markedly improved with total cholesterol 117, triglycerides 139, HDL 42 and LDL 51, down from 100.  08/22/2021  Ms. Casablanca is seen today for follow-up of her dyslipidemia.  There has been slight increase in her LDL cholesterol from 51-68 but otherwise pretty good control.  Total now 136, triglycerides 122 and HDL 46.  She has actually had 10 pound weight loss.  Recently blood pressure had spiked and she was prescribed low-dose amlodipine but she has not taken it as her blood pressure did improve.  In fact she was seen in the urgent care for this.  Blood pressure today was 127/80.  08/20/2022  Ms. Urness is seen today in follow-up. She was in the ER earlier this month with pain between her shoulder blades, which was thought to be reflux. She did rule-out of MI. Seen by Dr. Allyson Sabal after that. Plans to see Dr. Marina Goodell with GI.  Lipids look somewhat better. TC 147, TG 89, HDL 59, LDL 71. She has been exercising more and her HDL is higher (was 46) - LDL stable.  Prior CV studies:   The following studies were reviewed today:  Chart reviewed, labwork  PMHx:  Past Medical History:  Diagnosis Date   Allergy    SEASONAL   ANXIETY 10/28/2007   Arthritis    CAD (coronary artery disease), native coronary artery    a. Cath  10/06/2018 - S/p DES to [facility]; medical therapy for high grade ostial/pro 2nd digonal    Cataracts, bilateral    immature   Chronic back pain    Coronary artery disease    DIVERTICULOSIS, COLON 10/28/2007   Dry skin    red patch on right knee area   GERD 10/28/2007   takes Prevacid daily   H/O hiatal hernia    HLD (hyperlipidemia) 12/12/2017   Hx of colonic polyps    HYPERLIPIDEMIA 10/28/2007   unable to take meds d/t allergies   HYPERTENSION 10/28/2007   takes Diovan daily   Internal hemorrhoids    Irritable bowel syndrome (IBS)    Joint pain    Joint swelling    LIVER FUNCTION TESTS, ABNORMAL, HX OF 10/29/2007   Lumbar disc disease    Migraine    Myocardial infarction (HCC)    Obesity    OSTEOPENIA 10/29/2007   Osteoporosis    Presbyacusis 01/04/2010  Rosacea    Seasonal allergies    takes Clarinex daily   Vertigo    hx of   Vitamin D deficiency    takes Vit D daily    Past Surgical History:  Procedure Laterality Date   ANTERIOR CERVICAL DECOMP/DISCECTOMY FUSION  08/02/2011   Procedure: ANTERIOR CERVICAL DECOMPRESSION/DISCECTOMY FUSION 3 LEVELS;  Surgeon: Mariam Dollar, MD;  Location: [facility]UR[facility] OR[facility];  Service: Neurosurgery;  Laterality: N/A;  Cervical Three-Four, Cervical Four-Five, Cervical Five-Six  Anterior Cervical Decompression Fusion    CARDIAC CATHETERIZATION     COLONOSCOPY  2001/2013   CORONARY STENT INTERVENTION N/A 10/06/2018   Procedure: CORONARY STENT INTERVENTION;  Surgeon: Runell Gess, MD;  Location: [facility] INVASIVE CV LAB;  Service: Cardiovascular;  Laterality: N/A;   ESOPHAGOGASTRODUODENOSCOPY     LEFT HEART CATH AND CORONARY ANGIOGRAPHY N/A 10/06/2018   Procedure: LEFT HEART CATH AND CORONARY ANGIOGRAPHY;  Surgeon: Runell Gess, MD;  Location: [facility] INVASIVE CV LAB;  Service: Cardiovascular;  Laterality: N/A;   POLYPECTOMY     TONSILLECTOMY     TONSILLECTOMY  at age 59    FAMHx:  Family History  Problem Relation Age of Onset   Atrial fibrillation Mother     Hyperlipidemia Mother    Stroke Mother    Heart disease Mother    Atrial fibrillation Father    Hypothyroidism Sister    Hypothyroidism Maternal Grandmother    Diabetes Maternal Grandmother    Anesthesia problems Neg Hx    Hypotension Neg Hx    Malignant hyperthermia Neg Hx    Pseudochol deficiency Neg Hx    Colon cancer Neg Hx    Stomach cancer Neg Hx    Rectal cancer Neg Hx    Esophageal cancer Neg Hx     SOCHx:   reports that she has quit smoking. Her smoking use included cigarettes. She has never used smokeless tobacco. She reports that she does not currently use alcohol. She reports that she does not use drugs.  ALLERGIES:  Allergies  Allergen Reactions   Penicillins Hives, Itching and Rash    Did it involve swelling of the face/tongue/throat, SOB, or low BP? Unknown Did it involve sudden or severe rash/hives, skin peeling, or any reaction on the inside of your mouth or nose? Unknown Did you need to seek medical attention at a hospital or doctor's office? Unknown When did it last happen?      pt cant recall If all above answers are "NO", may proceed with cephalosporin use.    Rosuvastatin Other (See Comments)    REACTION: muscle weakness   Simvastatin Other (See Comments)    REACTION: muscle weakness   Statins Other (See Comments)    Myalgias and weakness   Sulfonamide Derivatives Other (See Comments)    Nausea, vomiting, abd pain   Evolocumab Swelling and Other (See Comments)    Swelling, itchy eyes, mouth swelling, red/puffy/hot face   Misc. Sulfonamide Containing Compounds Other (See Comments)   Other    Penicillin V Potassium Other (See Comments)   Brilinta [Ticagrelor] Rash    Itchy rash to torso   Doxycycline Itching and Rash   Doxycycline Hyclate Itching and Rash   Sulfa Antibiotics Other (See Comments)    Nausea, vomiting, abd pain    MEDS:  Current Meds  Medication Sig   aspirin 81 MG tablet Take 81 mg by mouth daily.   atorvastatin (LIPITOR)  10 MG tablet TAKE 1 TABLET EVERY DAY   Calcium Citrate (  CITRACAL PO) Take 2 tablets by mouth daily.    Cholecalciferol (VITAMIN D) 2000 UNITS CAPS Take 2,000 Units by mouth daily.   clopidogrel (PLAVIX) 75 MG tablet Take 75 mg by mouth daily.   esomeprazole (NEXIUM) 40 MG capsule TAKE 1 CAPSULE EVERY DAY   ezetimibe (ZETIA) 10 MG tablet TAKE 1 TABLET EVERY DAY   famotidine (PEPCID) 40 MG tablet TAKE 1 TABLET EVERY DAY   metoprolol tartrate (LOPRESSOR) 25 MG tablet TAKE 1/2 TABLET TWICE DAILY   metroNIDAZOLE (METROGEL) 0.75 % gel Apply 1 application topically at bedtime.   Multiple Vitamin (MULITIVITAMIN WITH MINERALS) TABS Take 1 tablet by mouth daily.   NEXLETOL 180 MG TABS TAKE 1 TABLET EVERY DAY   nitroGLYCERIN (NITROSTAT) 0.4 MG SL tablet Place 1 tablet (0.4 mg total) under the tongue every 5 (five) minutes x 3 doses as needed for chest pain.   pimecrolimus (ELIDEL) 1 % cream Apply 1 application topically every morning.   valsartan-hydrochlorothiazide (DIOVAN-HCT) 160-12.5 MG tablet TAKE 1 TABLET EVERY DAY     ROS: Pertinent items noted in HPI and remainder of comprehensive ROS otherwise negative.  Labs/Other Tests and Data Reviewed:    Recent Labs: 09/12/2021: ALT 32; TSH 1.62 08/06/2022: BUN 22; Creatinine, Ser 0.65; Hemoglobin 13.1; Platelets 261; Potassium 4.3; Sodium 137   Recent Lipid Panel Lab Results  Component Value Date/Time   CHOL 147 08/14/2022 08:50 AM   TRIG 89 08/14/2022 08:50 AM   HDL 59 08/14/2022 08:50 AM   CHOLHDL 2.5 08/14/2022 08:50 AM   CHOLHDL 4 09/12/2020 12:17 PM   LDLCALC 71 08/14/2022 08:50 AM   LDLDIRECT 177.0 06/05/2017 01:01 PM    Wt Readings from Last 3 Encounters:  08/20/22 153 lb 8 oz (69.6 kg)  08/07/22 153 lb 9.6 oz (69.7 kg)  08/06/22 157 lb 13.6 oz (71.6 kg)     Exam:    Vital Signs:  BP 138/76   Pulse 63   Ht 5' 3.5" (1.613 m)   Wt 153 lb 8 oz (69.6 kg)   SpO2 98%   BMI 26.76 kg/m    General appearance: alert and no  distress Lungs: no wheezes Abdomen: normal Extremities: extremities normal, atraumatic, no cyanosis or edema Neurologic: Grossly normal  ASSESSMENT & PLAN:    Mixed dyslipidemia, goal LDL <70 Relative statin intolerance Allergic reaction to Repatha CAD with recent NSTEMI and PCI to the RCA (10/2018)  Doing well on current regimen. HDL is much higher, probably related to increased exercise. I think she is doing well on her current regimen. I think it is reasonable to target her LDL to <70.  Would recommend we continue her regimen.    Patient Risk:   After full review of this patients clinical status, I feel that they are at least moderate risk at this time.  Time:   Today, I have spent 15 minutes with the patient with telehealth technology discussing dyslipidemia.     Medication Adjustments/Labs and Tests Ordered: Current medicines are reviewed at length with the patient today.  Concerns regarding medicines are outlined above.   Tests Ordered: No orders of the defined types were placed in this encounter.   Medication Changes: No orders of the defined types were placed in this encounter.   Disposition:  in 1 year(s)  Chrystie Nose, MD, [facility], FACP    [facility]  Medical Director of the Advanced Lipid Disorders &  Cardiovascular Risk Reduction Clinic Diplomate of the American Board of Clinical Lipidology  Attending Cardiologist  Direct Dial: [phone]  Fax: [phone]  Website:  [email or web address]  Chrystie Nose, MD  08/20/2022 9:20 AM

## 2022-08-20 NOTE — Patient Instructions (Signed)
Medication Instructions:  Your physician recommends that you continue on your current medications as directed. Please refer to the Current Medication list given to you today.  *If you need a refill on your cardiac medications before your next appointment, please call your pharmacy*  Follow-Up: At [facility], you and your health needs are our priority.  As part of our continuing mission to provide you with exceptional heart care, we have created designated Provider Care Teams.  These Care Teams include your primary Cardiologist (physician) and Advanced Practice Providers (APPs -  Physician Assistants and Nurse Practitioners) who all work together to provide you with the care you need, when you need it.  We recommend signing up for the patient portal called "MyChart".  Sign up information is provided on this After Visit Summary.  MyChart is used to connect with patients for Virtual Visits (Telemedicine).  Patients are able to view lab/test results, encounter notes, upcoming appointments, etc.  Non-urgent messages can be sent to your provider as well.   To learn more about what you can do with MyChart, go to [email or web address].    Your next appointment:   Dr. Hilty recommends that you schedule a follow up visit with him the in the LIPID CLINIC in 12 months. Please have fasting blood work about 1 week prior to this visit and he will review the blood work results with you at your appointment.   

## 2022-08-29 LAB — HM MAMMOGRAPHY

## 2022-09-06 ENCOUNTER — Ambulatory Visit: Payer: Medicare Other | Admitting: Internal Medicine

## 2022-09-18 ENCOUNTER — Ambulatory Visit: Payer: Medicare Other | Admitting: Internal Medicine

## 2022-09-21 ENCOUNTER — Ambulatory Visit: Payer: Medicare Other | Admitting: Cardiovascular Disease

## 2022-09-24 ENCOUNTER — Ambulatory Visit (INDEPENDENT_AMBULATORY_CARE_PROVIDER_SITE_OTHER): Payer: Medicare Other | Admitting: Internal Medicine

## 2022-09-24 ENCOUNTER — Encounter: Payer: Self-pay | Admitting: Internal Medicine

## 2022-09-24 ENCOUNTER — Telehealth: Payer: Self-pay

## 2022-09-24 VITALS — BP 120/72 | HR 64 | Temp 98.0°F | Ht 63.0 in | Wt 154.0 lb

## 2022-09-24 VITALS — BP 126/68 | HR 64 | Ht 63.0 in | Wt 155.2 lb

## 2022-09-24 DIAGNOSIS — K219 Gastro-esophageal reflux disease without esophagitis: Secondary | ICD-10-CM

## 2022-09-24 DIAGNOSIS — K6289 Other specified diseases of anus and rectum: Secondary | ICD-10-CM | POA: Diagnosis not present

## 2022-09-24 DIAGNOSIS — I10 Essential (primary) hypertension: Secondary | ICD-10-CM

## 2022-09-24 DIAGNOSIS — E559 Vitamin D deficiency, unspecified: Secondary | ICD-10-CM

## 2022-09-24 DIAGNOSIS — R739 Hyperglycemia, unspecified: Secondary | ICD-10-CM | POA: Diagnosis not present

## 2022-09-24 DIAGNOSIS — E782 Mixed hyperlipidemia: Secondary | ICD-10-CM | POA: Diagnosis not present

## 2022-09-24 DIAGNOSIS — E538 Deficiency of other specified B group vitamins: Secondary | ICD-10-CM | POA: Diagnosis not present

## 2022-09-24 DIAGNOSIS — R1030 Lower abdominal pain, unspecified: Secondary | ICD-10-CM | POA: Diagnosis not present

## 2022-09-24 DIAGNOSIS — R194 Change in bowel habit: Secondary | ICD-10-CM | POA: Diagnosis not present

## 2022-09-24 LAB — HEMOGLOBIN A1C: Hgb A1c MFr Bld: 5.5 % (ref 4.6–6.5)

## 2022-09-24 LAB — VITAMIN B12: Vitamin B-12: 968 pg/mL — ABNORMAL HIGH (ref 211–911)

## 2022-09-24 LAB — TSH: TSH: 1.59 u[IU]/mL (ref 0.35–5.50)

## 2022-09-24 LAB — VITAMIN D 25 HYDROXY (VIT D DEFICIENCY, FRACTURES): VITD: 91.98 ng/mL (ref 30.00–100.00)

## 2022-09-24 MED ORDER — ESOMEPRAZOLE MAGNESIUM 40 MG PO CPDR: 40.0000 mg | DELAYED_RELEASE_CAPSULE | Freq: Two times a day (BID) | ORAL | 3 refills | Status: DC

## 2022-09-24 MED ORDER — NA SULFATE-K SULFATE-MG SULF 17.5-3.13-1.6 GM/177ML PO SOLN: 1.0000 | Freq: Once | ORAL | 0 refills | Status: AC

## 2022-09-24 NOTE — Telephone Encounter (Signed)
     Primary Cardiologist: Nanetta Batty, MD  Chart reviewed as part of pre-operative protocol coverage. Given past medical history and time since last visit, based on ACC/AHA guidelines, Heather Merritt would be at acceptable risk for the planned procedure without further cardiovascular testing.   His Plavix may be held for 5 days prior to his procedure.  Please resume as soon as hemostasis is achieved.  His aspirin will need to be continued throughout the perioperative setting.  I will route this recommendation to the requesting party via Epic fax function and remove from pre-op pool.  Please call with questions. [name]. Katty Fretwell NP-C     09/24/2022, 2:25 PM [facility] [address] Office [phone] Fax [phone]

## 2022-09-24 NOTE — Telephone Encounter (Signed)
[facility] Pre-operative Risk Assessment     Request for surgical clearance:     Endoscopy Procedure  What type of surgery is being performed?     Endo/colon  When is this surgery scheduled?     10/15/2022  What type of clearance is required ?   Pharmacy  Are there any medications that need to be held prior to surgery and how long? Plavix - 5 days  Practice name and name of physician performing surgery?      [facility]  What is your office phone and fax number?      Phone- [phone]  Fax- [phone]  Anesthesia type (None, local, MAC, general) ?       MAC

## 2022-09-24 NOTE — Progress Notes (Unsigned)
Patient ID: Heather Merritt, female   DOB: 1955/04/22, 67 y.o.   MRN: [id number]        Chief Complaint: follow up HTN, HLD and hyperglycemia        HPI:  Heather Merritt is a 67 y.o. female here  Did see Dr Marina Goodell after recent episode reflux, for [facility] : [facility] and colonoscopy for July 15 with Dr Marina Goodell.  Seeing Dr Rennis Golden for lipids doing ok, requiring low does 3 med for reducing ldl.  Had Uti mar 2024, tx per GYN.         Wt Readings from Last 3 Encounters:  09/24/22 154 lb (69.9 kg)  09/24/22 155 lb 3.2 oz (70.4 kg)  08/20/22 153 lb 8 oz (69.6 kg)   BP Readings from Last 3 Encounters:  09/24/22 120/72  09/24/22 126/68  08/20/22 138/76         Past Medical History:  Diagnosis Date   Allergy    SEASONAL   ANXIETY 10/28/2007   Arthritis    CAD (coronary artery disease), native coronary artery    a. Cath 10/06/2018 - S/p DES to [facility]; medical therapy for high grade ostial/pro 2nd digonal    Cataracts, bilateral    immature   Chronic back pain    Coronary artery disease    DIVERTICULOSIS, COLON 10/28/2007   Dry skin    red patch on right knee area   GERD 10/28/2007   takes Prevacid daily   H/O hiatal hernia    HLD (hyperlipidemia) 12/12/2017   Hx of colonic polyps    HYPERLIPIDEMIA 10/28/2007   unable to take meds d/t allergies   HYPERTENSION 10/28/2007   takes Diovan daily   Internal hemorrhoids    Irritable bowel syndrome (IBS)    Joint pain    Joint swelling    LIVER FUNCTION TESTS, ABNORMAL, HX OF 10/29/2007   Lumbar disc disease    Migraine    Myocardial infarction (HCC)    Obesity    OSTEOPENIA 10/29/2007   Osteoporosis    Presbyacusis 01/04/2010   Rosacea    Seasonal allergies    takes Clarinex daily   Vertigo    hx of   Vitamin D deficiency    takes Vit D daily   Past Surgical History:  Procedure Laterality Date   ANTERIOR CERVICAL DECOMP/DISCECTOMY FUSION  08/02/2011   Procedure: ANTERIOR CERVICAL DECOMPRESSION/DISCECTOMY FUSION 3 LEVELS;  Surgeon: Mariam Dollar, MD;   Location: [facility];  Service: Neurosurgery;  Laterality: N/A;  Cervical Three-Four, Cervical Four-Five, Cervical Five-Six  Anterior Cervical Decompression Fusion    CARDIAC CATHETERIZATION     COLONOSCOPY  2001/2013   CORONARY STENT INTERVENTION N/A 10/06/2018   Procedure: CORONARY STENT INTERVENTION;  Surgeon: Runell Gess, MD;  Location: [facility];  Service: Cardiovascular;  Laterality: N/A;   ESOPHAGOGASTRODUODENOSCOPY     LEFT HEART CATH AND CORONARY ANGIOGRAPHY N/A 10/06/2018   Procedure: LEFT HEART CATH AND CORONARY ANGIOGRAPHY;  Surgeon: Runell Gess, MD;  Location: [facility];  Service: Cardiovascular;  Laterality: N/A;   POLYPECTOMY     TONSILLECTOMY     TONSILLECTOMY  at age 85    reports that she has quit smoking. Her smoking use included cigarettes. She has never used smokeless tobacco. She reports that she does not currently use alcohol. She reports that she does not use drugs. family history includes Atrial fibrillation in her father and mother; Diabetes in her maternal grandmother; Heart disease in her  mother; Hyperlipidemia in her mother; Hypothyroidism in her maternal grandmother and sister; Stroke in her mother. Allergies  Allergen Reactions   Penicillins Hives, Itching and Rash    Did it involve swelling of the face/tongue/throat, SOB, or low BP? Unknown Did it involve sudden or severe rash/hives, skin peeling, or any reaction on the inside of your mouth or nose? Unknown Did you need to seek medical attention at a hospital or doctor's office? Unknown When did it last happen?      pt cant recall If all above answers are "NO", may proceed with cephalosporin use.    Rosuvastatin Other (See Comments)    REACTION: muscle weakness   Simvastatin Other (See Comments)    REACTION: muscle weakness   Statins Other (See Comments)    Myalgias and weakness   Sulfonamide Derivatives Other (See Comments)    Nausea, vomiting, abd pain   Evolocumab Swelling  and Other (See Comments)    Swelling, itchy eyes, mouth swelling, red/puffy/hot face   Misc. Sulfonamide Containing Compounds Other (See Comments)   Other    Penicillin V Potassium Other (See Comments)   Brilinta [Ticagrelor] Rash    Itchy rash to torso   Doxycycline Itching and Rash   Doxycycline Hyclate Itching and Rash   Sulfa Antibiotics Other (See Comments)    Nausea, vomiting, abd pain   Current Outpatient Medications on File Prior to Visit  Medication Sig Dispense Refill   aspirin 81 MG tablet Take 81 mg by mouth daily.     atorvastatin (LIPITOR) 10 MG tablet TAKE 1 TABLET EVERY DAY 90 tablet 3   Calcium Citrate (CITRACAL PO) Take 2 tablets by mouth daily.      Cholecalciferol (VITAMIN D) 2000 UNITS CAPS Take 2,000 Units by mouth daily.     clopidogrel (PLAVIX) 75 MG tablet Take 75 mg by mouth daily.     esomeprazole (NEXIUM) 40 MG capsule Take 1 capsule (40 mg total) by mouth in the morning and at bedtime. 180 capsule 3   ezetimibe (ZETIA) 10 MG tablet TAKE 1 TABLET EVERY DAY 90 tablet 3   famotidine (PEPCID) 40 MG tablet TAKE 1 TABLET EVERY DAY 90 tablet 4   metoprolol tartrate (LOPRESSOR) 25 MG tablet TAKE 1/2 TABLET TWICE DAILY 90 tablet 2   metroNIDAZOLE (METROGEL) 0.75 % gel Apply 1 application topically at bedtime. 45 g 0   Multiple Vitamin (MULITIVITAMIN WITH MINERALS) TABS Take 1 tablet by mouth daily.     Na Sulfate-K Sulfate-Mg Sulf 17.5-3.13-1.6 GM/177ML SOLN Take 1 kit by mouth once for 1 dose. 354 mL 0   NEXLETOL 180 MG TABS TAKE 1 TABLET EVERY DAY 90 tablet 3   nitroGLYCERIN (NITROSTAT) 0.4 MG SL tablet Place 1 tablet (0.4 mg total) under the tongue every 5 (five) minutes x 3 doses as needed for chest pain. 25 tablet 6   pimecrolimus (ELIDEL) 1 % cream Apply 1 application topically every morning. 30 g 0   valsartan-hydrochlorothiazide (DIOVAN-HCT) 160-12.5 MG tablet TAKE 1 TABLET EVERY DAY 90 tablet 2   No current facility-administered medications on file prior to  visit.        ROS:  All others reviewed and negative.  Objective        PE:  BP 120/72 (BP Location: Left Arm, Patient Position: Sitting, Cuff Size: Normal)   Pulse 64   Temp 98 F (36.7 C) (Oral)   Ht 5\' 3"  (1.6 m)   Wt 154 lb (69.9 kg)   SpO2 99%  BMI 27.28 kg/m                 Constitutional: Pt appears in NAD               HENT: Head: NCAT.                Right Ear: External ear normal.                 Left Ear: External ear normal.                Eyes: . Pupils are equal, round, and reactive to light. Conjunctivae and EOM are normal               Nose: without d/c or deformity               Neck: Neck supple. Gross normal ROM               Cardiovascular: Normal rate and regular rhythm.                 Pulmonary/Chest: Effort normal and breath sounds without rales or wheezing.                Abd:  Soft, NT, ND, + BS, no organomegaly               Neurological: Pt is alert. At baseline orientation, motor grossly intact               Skin: Skin is warm. No rashes, no other new lesions, LE edema - ***               Psychiatric: Pt behavior is normal without agitation   Micro: none  Cardiac tracings I have personally interpreted today:  none  Pertinent Radiological findings (summarize): none   Lab Results  Component Value Date   WBC 4.3 08/06/2022   HGB 13.1 08/06/2022   HCT 39.4 08/06/2022   PLT 261 08/06/2022   GLUCOSE 95 08/06/2022   CHOL 147 08/14/2022   TRIG 89 08/14/2022   HDL 59 08/14/2022   LDLDIRECT 177.0 06/05/2017   LDLCALC 71 08/14/2022   ALT 32 09/12/2021   AST 30 09/12/2021   NA 137 08/06/2022   K 4.3 08/06/2022   CL 99 08/06/2022   CREATININE 0.65 08/06/2022   BUN 22 08/06/2022   CO2 30 08/06/2022   TSH 1.62 09/12/2021   INR 0.9 10/03/2018   HGBA1C 5.5 09/12/2021   Assessment/Plan:  JAVIA DILLOW is a 67 y.o. White or Caucasian [1] female with  has a past medical history of Allergy, ANXIETY (10/28/2007), Arthritis, CAD (coronary artery  disease), native coronary artery, Cataracts, bilateral, Chronic back pain, Coronary artery disease, DIVERTICULOSIS, COLON (10/28/2007), Dry skin, GERD (10/28/2007), H/O hiatal hernia, HLD (hyperlipidemia) (12/12/2017), colonic polyps, HYPERLIPIDEMIA (10/28/2007), HYPERTENSION (10/28/2007), Internal hemorrhoids, Irritable bowel syndrome (IBS), Joint pain, Joint swelling, LIVER FUNCTION TESTS, ABNORMAL, HX OF (10/29/2007), Lumbar disc disease, Migraine, Myocardial infarction (HCC), Obesity, OSTEOPENIA (10/29/2007), Osteoporosis, Presbyacusis (01/04/2010), Rosacea, Seasonal allergies, Vertigo, and Vitamin D deficiency.  No problem-specific Assessment & Plan notes found for this encounter.  Followup: No follow-ups on file.  Oliver Barre, MD 09/24/2022 2:41 PM [facility] Internal Medicine

## 2022-09-24 NOTE — Patient Instructions (Signed)

## 2022-09-24 NOTE — Progress Notes (Signed)
HISTORY OF PRESENT ILLNESS:  Heather Merritt is a 67 y.o. female with multiple medical problems including coronary artery disease with prior stent placement for which she is on chronic Plavix therapy.  She has a history of GERD for which she is on PPI.  She is sent today at the urging of her cardiologist regarding dyspeptic complaints of chest pain.  She states that her previous anginal equivalent was chest discomfort with funny sensation with swallowing issues and reflux.  She is gone to the emergency room several times with similar complaints determined not to be cardiac in origin.  GI evaluation was recommended.  She also describes issues with her bowel habits.  Somewhat irregular.  Vague abdominal pain, particularly in the left lower quadrant.  Some rectal pain.  No overt bleeding.  She takes Nexium once daily and Pepcid at night.  Despite this, she will experience breakthrough reflux symptoms with dietary indiscretion and at other times.  She is concerned.  Previous upper endoscopy in March 2017 was performed to evaluate epigastric pain, gas bloat type symptoms, lower chest pressure with negative cardiac workups, and vague dysphagia.  This all sounds quite similar to the current symptom complex for which evaluation is being requested.  Her last colonoscopy was March 2017.  She did have a history of adenomatous colon polyps previously.  However, the most recent examination revealed diverticulosis but was otherwise negative.  She was seen in this office by the GI physician assistant February 2022 regarding swallowing complaints and chest discomfort.  See that dictation.  A barium esophagram was ordered and performed May 13, 2020.  She was noted to have some changes consistent with dysmotility which were similar to previous exams.  Esophagram was otherwise negative.  Blood work from Aug 06, 2022 shows unremarkable basic metabolic panel.  Normal CBC with hemoglobin 13.1.  REVIEW OF SYSTEMS:  All  non-GI ROS negative unless otherwise stated in the HPI except for sinus and allergy trouble, arthritis, back pain  Past Medical History:  Diagnosis Date   Allergy    SEASONAL   ANXIETY 10/28/2007   Arthritis    CAD (coronary artery disease), native coronary artery    a. Cath 10/06/2018 - S/p DES to [facility]; medical therapy for high grade ostial/pro 2nd digonal    Cataracts, bilateral    immature   Chronic back pain    Coronary artery disease    DIVERTICULOSIS, COLON 10/28/2007   Dry skin    red patch on right knee area   GERD 10/28/2007   takes Prevacid daily   H/O hiatal hernia    HLD (hyperlipidemia) 12/12/2017   Hx of colonic polyps    HYPERLIPIDEMIA 10/28/2007   unable to take meds d/t allergies   HYPERTENSION 10/28/2007   takes Diovan daily   Internal hemorrhoids    Irritable bowel syndrome (IBS)    Joint pain    Joint swelling    LIVER FUNCTION TESTS, ABNORMAL, HX OF 10/29/2007   Lumbar disc disease    Migraine    Myocardial infarction (HCC)    Obesity    OSTEOPENIA 10/29/2007   Osteoporosis    Presbyacusis 01/04/2010   Rosacea    Seasonal allergies    takes Clarinex daily   Vertigo    hx of   Vitamin D deficiency    takes Vit D daily    Past Surgical History:  Procedure Laterality Date   ANTERIOR CERVICAL DECOMP/DISCECTOMY FUSION  08/02/2011   Procedure: ANTERIOR CERVICAL DECOMPRESSION/DISCECTOMY FUSION  3 LEVELS;  Surgeon: Mariam Dollar, MD;  Location: [facility];  Service: Neurosurgery;  Laterality: N/A;  Cervical Three-Four, Cervical Four-Five, Cervical Five-Six  Anterior Cervical Decompression Fusion    CARDIAC CATHETERIZATION     COLONOSCOPY  2001/2013   CORONARY STENT INTERVENTION N/A 10/06/2018   Procedure: CORONARY STENT INTERVENTION;  Surgeon: Runell Gess, MD;  Location: [facility];  Service: Cardiovascular;  Laterality: N/A;   ESOPHAGOGASTRODUODENOSCOPY     LEFT HEART CATH AND CORONARY ANGIOGRAPHY N/A 10/06/2018   Procedure: LEFT HEART CATH AND  CORONARY ANGIOGRAPHY;  Surgeon: Runell Gess, MD;  Location: [facility];  Service: Cardiovascular;  Laterality: N/A;   POLYPECTOMY     TONSILLECTOMY     TONSILLECTOMY  at age 78    Social History CARI BURGO  reports that she has quit smoking. Her smoking use included cigarettes. She has never used smokeless tobacco. She reports that she does not currently use alcohol. She reports that she does not use drugs.  family history includes Atrial fibrillation in her father and mother; Diabetes in her maternal grandmother; Heart disease in her mother; Hyperlipidemia in her mother; Hypothyroidism in her maternal grandmother and sister; Stroke in her mother.  Allergies  Allergen Reactions   Penicillins Hives, Itching and Rash    Did it involve swelling of the face/tongue/throat, SOB, or low BP? Unknown Did it involve sudden or severe rash/hives, skin peeling, or any reaction on the inside of your mouth or nose? Unknown Did you need to seek medical attention at a hospital or doctor's office? Unknown When did it last happen?      pt cant recall If all above answers are "NO", may proceed with cephalosporin use.    Rosuvastatin Other (See Comments)    REACTION: muscle weakness   Simvastatin Other (See Comments)    REACTION: muscle weakness   Statins Other (See Comments)    Myalgias and weakness   Sulfonamide Derivatives Other (See Comments)    Nausea, vomiting, abd pain   Evolocumab Swelling and Other (See Comments)    Swelling, itchy eyes, mouth swelling, red/puffy/hot face   Misc. Sulfonamide Containing Compounds Other (See Comments)   Other    Penicillin V Potassium Other (See Comments)   Brilinta [Ticagrelor] Rash    Itchy rash to torso   Doxycycline Itching and Rash   Doxycycline Hyclate Itching and Rash   Sulfa Antibiotics Other (See Comments)    Nausea, vomiting, abd pain       PHYSICAL EXAMINATION: Vital signs: BP 126/68   Pulse 64   Ht 5\' 3"  (1.6 m)   Wt  155 lb 3.2 oz (70.4 kg)   SpO2 98%   BMI 27.49 kg/m   Constitutional: generally well-appearing, no acute distress Psychiatric: alert and oriented x3, cooperative Eyes: extraocular movements intact, anicteric, conjunctiva pink Mouth: oral pharynx moist, no lesions Neck: supple no lymphadenopathy Cardiovascular: heart regular rate and rhythm, no murmur Lungs: clear to auscultation bilaterally Abdomen: soft, nontender, nondistended, no obvious ascites, no peritoneal signs, normal bowel sounds, no organomegaly Rectal: Deferred until colonoscopy Extremities: no clubbing, cyanosis, or lower extremity edema bilaterally Skin: no lesions on visible extremities Neuro: No focal deficits.  Cranial nerves intact  ASSESSMENT:  1.  Atypical chest pain.  This is a patient with coronary artery disease.  This in a patient with GERD.  This in the patient with probable esophageal dysmotility. 2.  GERD with intermittent breakthrough symptoms. 3.  Change in  bowel habits 4.  History of adenomatous colon polyps 5.  Multiple general medical problems   PLAN:  1.  Increase Nexium to 40 mg p.o. twice daily.  Prescription rewritten.  Medication risk reviewed. 2.  Initiate Citrucel 2 tablespoons daily for irregular bowel habits 3.  Schedule upper endoscopy to evaluate atypical chest pain.  The patient is high risk given her comorbidities and the need to address Plavix therapy.The nature of the procedure, as well as the risks, benefits, and alternatives were carefully and thoroughly reviewed with the patient. Ample time for discussion and questions allowed. The patient understood, was satisfied, and agreed to proceed. 4.  Reflux precautions 5.  Schedule colonoscopy to evaluate change in bowel habits and lower abdominal discomfort.  Patient is high risk as above.The nature of the procedure, as well as the risks, benefits, and alternatives were carefully and thoroughly reviewed with the patient. Ample time for  discussion and questions allowed. The patient understood, was satisfied, and agreed to proceed. 6.  Would like to have the patient hold Plavix for 5 days prior to her procedures but continue on aspirin throughout.  We will confer with her cardiologist in this regard.  A total time of 45 minutes was spent preparing to see the patient, obtaining interval history, reviewing myriad of data, reaching out to her cardiologist, performing medically appropriate physical examination, counseling and educating the patient regarding the above listed issues, ordering medication, ordering multiple endoscopic procedures, and documenting clinical information in the health record

## 2022-09-24 NOTE — Patient Instructions (Addendum)
We have sent the following medications to your pharmacy for you to pick up at your convenience:  Nexium - increased to twice a day    You have been scheduled for an endoscopy and colonoscopy. Please follow the written instructions given to you at your visit today. Please pick up your prep supplies at the pharmacy within the next 1-3 days. If you use inhalers (even only as needed), please bring them with you on the day of your procedure.   Take 2 tablespoons of Citrucel daily  _______________________________________________________  If your blood pressure at your visit was 140/90 or greater, please contact your primary care physician to follow up on this.  _______________________________________________________  If you are age 43 or older, your body mass index should be between 23-30. Your Body mass index is 27.49 kg/m. If this is out of the aforementioned range listed, please consider follow up with your Primary Care Provider.  If you are age 72 or younger, your body mass index should be between 19-25. Your Body mass index is 27.49 kg/m. If this is out of the aformentioned range listed, please consider follow up with your Primary Care Provider.   ________________________________________________________  The [facility] providers would like to encourage you to use [facility] to communicate with providers for non-urgent requests or questions.  Due to long hold times on the telephone, sending your provider a message by [facility] may be a faster and more efficient way to get a response.  Please allow 48 business hours for a response.  Please remember that this is for non-urgent requests.  _______________________________________________________

## 2022-09-25 ENCOUNTER — Encounter: Payer: Self-pay | Admitting: Internal Medicine

## 2022-09-25 LAB — URINALYSIS, ROUTINE W REFLEX MICROSCOPIC
Bilirubin Urine: NEGATIVE
Hgb urine dipstick: NEGATIVE
Ketones, ur: NEGATIVE
Leukocytes,Ua: NEGATIVE
Nitrite: NEGATIVE
RBC / HPF: NONE SEEN (ref 0–?)
Specific Gravity, Urine: 1.015 (ref 1.000–1.030)
Total Protein, Urine: NEGATIVE
Urine Glucose: NEGATIVE
Urobilinogen, UA: 0.2 (ref 0.0–1.0)
pH: 7 (ref 5.0–8.0)

## 2022-09-25 LAB — HEPATIC FUNCTION PANEL
ALT: 29 U/L (ref 0–35)
AST: 38 U/L — ABNORMAL HIGH (ref 0–37)
Albumin: 4.9 g/dL (ref 3.5–5.2)
Alkaline Phosphatase: 47 U/L (ref 39–117)
Bilirubin, Direct: 0.1 mg/dL (ref 0.0–0.3)
Total Bilirubin: 0.6 mg/dL (ref 0.2–1.2)
Total Protein: 7.9 g/dL (ref 6.0–8.3)

## 2022-09-25 NOTE — Assessment & Plan Note (Signed)
Lab Results  Component Value Date   LDLCALC 71 08/14/2022   Uncontrolled, goal ldl < 70, pt to continue current statin lipitor 10 mg and lower chol diet, declines change for now

## 2022-09-25 NOTE — Assessment & Plan Note (Signed)
Lab Results  Component Value Date   HGBA1C 5.5 09/24/2022   Stable, pt to continue current medical treatment  - diet wt control

## 2022-09-25 NOTE — Progress Notes (Signed)
The test results show that your current treatment is OK, as the tests are stable.  Please continue the same plan.  There is no other need for change of treatment or further evaluation based on these results, at this time.  thanks 

## 2022-09-25 NOTE — Assessment & Plan Note (Signed)
BP Readings from Last 3 Encounters:  09/24/22 120/72  09/24/22 126/68  08/20/22 138/76   Stable, pt to continue medical treatment lopressor 12.5 bid,

## 2022-09-26 ENCOUNTER — Encounter: Payer: Self-pay | Admitting: Internal Medicine

## 2022-09-26 ENCOUNTER — Encounter (HOSPITAL_BASED_OUTPATIENT_CLINIC_OR_DEPARTMENT_OTHER): Payer: Self-pay | Admitting: Urology

## 2022-09-26 ENCOUNTER — Other Ambulatory Visit: Payer: Self-pay

## 2022-09-26 ENCOUNTER — Emergency Department (HOSPITAL_BASED_OUTPATIENT_CLINIC_OR_DEPARTMENT_OTHER)
Admission: EM | Admit: 2022-09-26 | Discharge: 2022-09-26 | Disposition: A | Discharge status: Home / Self Care | Payer: Medicare Other | Attending: Emergency Medicine | Admitting: Emergency Medicine

## 2022-09-26 DIAGNOSIS — Z7902 Long term (current) use of antithrombotics/antiplatelets: Secondary | ICD-10-CM | POA: Insufficient documentation

## 2022-09-26 DIAGNOSIS — N3090 Cystitis, unspecified without hematuria: Secondary | ICD-10-CM | POA: Insufficient documentation

## 2022-09-26 DIAGNOSIS — Z7982 Long term (current) use of aspirin: Secondary | ICD-10-CM | POA: Diagnosis not present

## 2022-09-26 DIAGNOSIS — Z79899 Other long term (current) drug therapy: Secondary | ICD-10-CM | POA: Diagnosis not present

## 2022-09-26 DIAGNOSIS — Z87891 Personal history of nicotine dependence: Secondary | ICD-10-CM | POA: Diagnosis not present

## 2022-09-26 DIAGNOSIS — I10 Essential (primary) hypertension: Secondary | ICD-10-CM | POA: Diagnosis not present

## 2022-09-26 DIAGNOSIS — I2510 Atherosclerotic heart disease of native coronary artery without angina pectoris: Secondary | ICD-10-CM | POA: Insufficient documentation

## 2022-09-26 DIAGNOSIS — R300 Dysuria: Secondary | ICD-10-CM | POA: Diagnosis present

## 2022-09-26 LAB — URINALYSIS, W/ REFLEX TO CULTURE (INFECTION SUSPECTED)
Bilirubin Urine: NEGATIVE
Glucose, UA: 100 mg/dL — AB
Ketones, ur: NEGATIVE mg/dL
Nitrite: POSITIVE — AB
Protein, ur: 100 mg/dL — AB
Specific Gravity, Urine: >=1.03 (ref 1.005–1.030)
pH: 5 (ref 5.0–8.0)

## 2022-09-26 MED ORDER — CEPHALEXIN 500 MG PO CAPS: 500.0000 mg | ORAL_CAPSULE | Freq: Four times a day (QID) | ORAL | 0 refills | Status: DC

## 2022-09-26 MED ORDER — NITROFURANTOIN MONOHYD MACRO 100 MG PO CAPS
100.0000 mg | ORAL_CAPSULE | Freq: Two times a day (BID) | ORAL | 0 refills | Status: DC
Start: 2022-09-26 — End: 2022-09-26

## 2022-09-26 MED ORDER — PHENAZOPYRIDINE HCL 200 MG PO TABS: 200.0000 mg | ORAL_TABLET | Freq: Three times a day (TID) | ORAL | 1 refills | Status: DC | PRN

## 2022-09-26 NOTE — Telephone Encounter (Signed)
Patient is concerned that her labs show a possible UTI. She would like a call back at [phone].

## 2022-09-26 NOTE — Telephone Encounter (Signed)
Patient called back and said she needs to speak with someone. She is adamant that she has a UTI and would like someone to call her ASAP. Best callback is [phone].

## 2022-09-26 NOTE — Discharge Instructions (Addendum)
As discussed, your urine did show signs of infection.  Will treat this with antibiotics in outpatient setting.  Given how many urinary tract infections you have had this year, recommend follow-up with urology for potential underlying cause.  Continue proper hygiene as we discussed to decrease likelihood of recurrence of urinary tract infection.  We have also obtained a urine culture and we will call you if the antibiotic is not alligned with the bacteria in your urine.  Please do not hesitate to return to emergency department if the worrisome signs and symptoms we discussed become apparent.

## 2022-09-26 NOTE — Telephone Encounter (Signed)
Ok this is done, thanks 

## 2022-09-26 NOTE — ED Triage Notes (Signed)
Pt states dysuria x 4 days, worsening since this am  State burning with urination and frequency  Denies back pain

## 2022-09-26 NOTE — ED Provider Notes (Signed)
[place] [facility]MENT AT [facility] Provider Note   CSN: [id number] Arrival date & time: 09/26/22  1629     History  Chief Complaint  Patient presents with   Dysuria    Heather Merritt is a 67 y.o. female.   Dysuria   67 year old female presents emergency department with complaints of dysuria, urinary frequency, "bladder pressure."  Patient states that symptoms have been present for the past 2 days.  States she has a history of UTI and this feels the exact same.  Denies any fever, flank pain, nausea, vomiting, vaginal symptoms, change in bowel habits.  Has not trying at home Azo which has helped with symptoms but due to persistence, decided come to the emergency department to check for infection.  Patient states that she last used Azo around 11 AM this morning  Past medical history significant for hyperlipidemia, CAD, IBS, MI, chronic back pain, seasonal allergy  Home Medications Prior to Admission medications   Medication Sig Start Date End Date Taking? Authorizing Provider  cephALEXin (KEFLEX) 500 MG capsule Take 1 capsule (500 mg total) by mouth 4 (four) times daily. 09/26/22  Yes Sherian Maroon A, PA  aspirin 81 MG tablet Take 81 mg by mouth daily.    [provider]  atorvastatin (LIPITOR) 10 MG tablet TAKE 1 TABLET EVERY DAY 01/31/22   Hilty, Lisette Abu, MD  Calcium Citrate (CITRACAL PO) Take 2 tablets by mouth daily.     [provider]  Cholecalciferol (VITAMIN D) 2000 UNITS CAPS Take 2,000 Units by mouth daily.    [provider]  clopidogrel (PLAVIX) 75 MG tablet Take 75 mg by mouth daily.    [provider]  esomeprazole (NEXIUM) 40 MG capsule Take 1 capsule (40 mg total) by mouth in the morning and at bedtime. 09/24/22   Hilarie Fredrickson, MD  ezetimibe (ZETIA) 10 MG tablet TAKE 1 TABLET EVERY DAY 01/31/22   Chrystie Nose, MD  famotidine (PEPCID) 40 MG tablet TAKE 1 TABLET EVERY DAY 08/09/21   Esterwood, Amy S, PA-C   metoprolol tartrate (LOPRESSOR) 25 MG tablet TAKE 1/2 TABLET TWICE DAILY 01/31/22   Corwin Levins, MD  metroNIDAZOLE (METROGEL) 0.75 % gel Apply 1 application topically at bedtime. 06/11/17   Corwin Levins, MD  Multiple Vitamin (MULITIVITAMIN WITH MINERALS) TABS Take 1 tablet by mouth daily.    [provider]  NEXLETOL 180 MG TABS TAKE 1 TABLET EVERY DAY 11/03/21   Hilty, Lisette Abu, MD  nitroGLYCERIN (NITROSTAT) 0.4 MG SL tablet Place 1 tablet (0.4 mg total) under the tongue every 5 (five) minutes x 3 doses as needed for chest pain. 04/07/21   Runell Gess, MD  phenazopyridine (PYRIDIUM) 200 MG tablet Take 1 tablet (200 mg total) by mouth 3 (three) times daily as needed for pain. 09/26/22   Corwin Levins, MD  pimecrolimus (ELIDEL) 1 % cream Apply 1 application topically every morning. 06/11/17   Corwin Levins, MD  valsartan-hydrochlorothiazide (DIOVAN-HCT) 160-12.5 MG tablet TAKE 1 TABLET EVERY DAY 01/31/22   Corwin Levins, MD      Allergies    Penicillins, Rosuvastatin, Simvastatin, Statins, Sulfonamide derivatives, Evolocumab, Misc. sulfonamide containing compounds, Other, Penicillin v potassium, Brilinta [ticagrelor], Doxycycline, Doxycycline hyclate, and Sulfa antibiotics    Review of Systems   Review of Systems  Genitourinary:  Positive for dysuria.  All other systems reviewed and are negative.   Physical Exam Updated Vital Signs BP (!) 155/80 (BP  Location: Left Arm)   Pulse 70   Temp 97.7 F (36.5 C) (Oral)   Resp 18   Ht 5\' 3"  (1.6 m)   Wt 69.8 kg   SpO2 95%   BMI 27.28 kg/m  Physical Exam Vitals and nursing note reviewed.  Constitutional:      General: She is not in acute distress.    Appearance: She is well-developed.  HENT:     Head: Normocephalic and atraumatic.  Eyes:     Conjunctiva/sclera: Conjunctivae normal.  Cardiovascular:     Rate and Rhythm: Normal rate and regular rhythm.  Pulmonary:     Effort: Pulmonary effort is normal. No respiratory  distress.     Breath sounds: Normal breath sounds.  Abdominal:     Palpations: Abdomen is soft.     Tenderness: There is no right CVA tenderness or left CVA tenderness.     Comments: Very mild supra tenderness.  Musculoskeletal:        General: No swelling.     Cervical back: Neck supple.  Skin:    General: Skin is warm and dry.     Capillary Refill: Capillary refill takes less than 2 seconds.  Neurological:     Mental Status: She is alert.  Psychiatric:        Mood and Affect: Mood normal.     ED Results / Procedures / Treatments   Labs (all labs ordered are listed, but only abnormal results are displayed) Labs Reviewed  URINALYSIS, W/ REFLEX TO CULTURE (INFECTION SUSPECTED) - Abnormal; Notable for the following components:      Result Value   Color, Urine ORANGE (*)    APPearance CLOUDY (*)    Glucose, UA 100 (*)    Hgb urine dipstick LARGE (*)    Protein, ur 100 (*)    Nitrite POSITIVE (*)    Leukocytes,Ua SMALL (*)    Non Squamous Epithelial PRESENT (*)    Bacteria, UA FEW (*)    All other components within normal limits  URINE CULTURE    EKG None  Radiology No results found.  Procedures Procedures    Medications Ordered in ED Medications - No data to display  ED Course/ Medical Decision Making/ A&P                             Medical Decision Making Amount and/or Complexity of Data Reviewed Labs: ordered.  Risk Prescription drug management.   This patient presents to the ED for concern of urinary symptoms, this involves an extensive number of treatment options, and is a complaint that carries with it a high risk of complications and morbidity.  The differential diagnosis includes cystitis, pyelonephritis, sepsis   Co morbidities that complicate the patient evaluation  See HPI   Additional history obtained:  Additional history obtained from EMR External records from outside source obtained and reviewed including hospital records   Lab  Tests:  I Ordered, and personally interpreted labs.  The pertinent results include: UA few bacteria, 21-50 RBCs, 11-20 WBCs with small leukocytes, positive nitrite.  UA also with 100 proteins, 100 glucose and large hemoglobin.   Imaging Studies ordered:  N/a   Cardiac Monitoring: / EKG:  The patient was maintained on a cardiac monitor.  I personally viewed and interpreted the cardiac monitored which showed an underlying rhythm of: Sinus rhythm   Consultations Obtained:  N/a   Problem List / ED Course / Critical interventions /  Medication management  Cystitis Reevaluation of the patient showed that the patient stayed the same I have reviewed the patients home medicines and have made adjustments as needed   Social Determinants of Health:  Former cigarette use.  Denies illicit drug use.   Test / Admission - Considered:  Cystitis Vitals signs significant for hypertension with blood pressure 155/80. Otherwise within normal range and stable throughout visit. Laboratory studies significant for: See above 67 year old female presents emergency department with urinary symptoms present for the past 2 to 3 days.  Patient found with evidence of urinary tract infection.  No evidence of fever, tachycardia, tachypnea, flank pain so low suspicion for sepsis, pyelonephritis.  Will treat empirically with antibiotics and culture urine for isolation of bacterial culprit.  Will recommend follow-up with urology in the outpatient setting given the patient has had 3-4 urinary tract infections this year which is abnormal for her with being mindful of proper hygiene at home.  Treatment plan discussed at length with patient and she knowledge understanding was agreeable to said plan.  Patient overall well-appearing, febrile no acute distress.   Worrisome signs and symptoms were discussed with the patient, and the patient acknowledged understanding to return to the ED if noticed. Patient was stable upon  discharge.          Final Clinical Impression(s) / ED Diagnoses Final diagnoses:  Cystitis    Rx / DC Orders ED Discharge Orders          Ordered    cephALEXin (KEFLEX) 500 MG capsule  4 times daily        09/26/22 1731              Peter Garter, [place] 09/26/22 1904    Loetta Rough, MD 09/26/22 1945

## 2022-09-27 ENCOUNTER — Telehealth: Payer: Self-pay

## 2022-09-27 NOTE — Transitions of Care (Post Inpatient/ED Visit) (Signed)
09/27/2022  Name: Heather Merritt MRN: [id number] DOB: 04-Dec-1955  Today's TOC FU Call Status: Today's TOC FU Call Status:: Successful TOC FU Call Competed TOC FU Call Complete Date: 09/27/22  Transition Care Management Follow-up Telephone Call Date of Discharge: 09/26/22 Discharge Facility: [facility] Type of Discharge: Emergency Department Reason for ED Visit: Other: (cystitis) How have you been since you were released from the hospital?: Better Any questions or concerns?: No  Items Reviewed: Did you receive and understand the discharge instructions provided?: Yes Medications obtained,verified, and reconciled?: Yes (Medications Reviewed) Any new allergies since your discharge?: No Dietary orders reviewed?: Yes Do you have support at home?: No  Medications Reviewed Today: Medications Reviewed Today     Reviewed by Karena Addison, LPN (Licensed Practical Nurse) on 09/27/22 at 1117  Med List Status: <None>   Medication Order Taking? Sig Documenting Provider Last Dose Status Informant  aspirin 81 MG tablet [id number] No Take 81 mg by mouth daily. [provider] Taking Active Self  atorvastatin (LIPITOR) 10 MG tablet [id number] No TAKE 1 TABLET EVERY DAY Hilty, Lisette Abu, MD Taking Active   Calcium Citrate (CITRACAL PO) [id number] No Take 2 tablets by mouth daily.  [provider] Taking Active Self  cephALEXin (KEFLEX) 500 MG capsule [id number]  Take 1 capsule (500 mg total) by mouth 4 (four) times daily. Sherian Maroon A, PA  Active   Cholecalciferol (VITAMIN D) 2000 UNITS CAPS [id number] No Take 2,000 Units by mouth daily. [provider] Taking Active Self  clopidogrel (PLAVIX) 75 MG tablet [id number] No Take 75 mg by mouth daily. [provider] Taking Active Self  esomeprazole (NEXIUM) 40 MG capsule [id number] No Take 1 capsule (40 mg total) by mouth in the morning and at bedtime. Hilarie Fredrickson, MD Taking Active   ezetimibe (ZETIA) 10 MG  tablet [id number] No TAKE 1 TABLET EVERY DAY Hilty, Lisette Abu, MD Taking Active   famotidine (PEPCID) 40 MG tablet [id number] No TAKE 1 TABLET EVERY DAY Esterwood, Amy S, PA-C Taking Active   metoprolol tartrate (LOPRESSOR) 25 MG tablet [id number] No TAKE 1/2 TABLET TWICE DAILY Corwin Levins, MD Taking Active   metroNIDAZOLE (METROGEL) 0.75 % gel [id number] No Apply 1 application topically at bedtime. Corwin Levins, MD Taking Active Self  Multiple Vitamin [name] WITH MINERALS) TABS [id number] No Take 1 tablet by mouth daily. [provider] Taking Active Self  NEXLETOL 180 MG TABS [id number] No TAKE 1 TABLET EVERY DAY Hilty, Lisette Abu, MD Taking Active   nitroGLYCERIN (NITROSTAT) 0.4 MG SL tablet [id number] No Place 1 tablet (0.4 mg total) under the tongue every 5 (five) minutes x 3 doses as needed for chest pain. Runell Gess, MD Taking Active            Med Note [facility], REBECCA G   Tue Aug 07, 2022  9:58 AM) Never needed  phenazopyridine (PYRIDIUM) 200 MG tablet [id number]  Take 1 tablet (200 mg total) by mouth 3 (three) times daily as needed for pain. Corwin Levins, MD  Active   pimecrolimus (ELIDEL) 1 % cream [id number] No Apply 1 application topically every morning. Corwin Levins, MD Taking Active Self  valsartan-hydrochlorothiazide (DIOVAN-HCT) 160-12.5 MG tablet [id number] No TAKE 1 TABLET EVERY DAY Corwin Levins, MD Taking Active             Home Care and Equipment/Supplies: Were Home Health Services Ordered?: NA Any new equipment or medical supplies ordered?: NA  Functional  Questionnaire: Do you need assistance with bathing/showering or dressing?: No Do you need assistance with meal preparation?: No Do you need assistance with eating?: No Do you have difficulty maintaining continence: No Do you need assistance with getting out of bed/getting out of a chair/moving?: No Do you have difficulty managing or taking your medications?: No  Follow up appointments  reviewed: PCP Follow-up appointment confirmed?: NA Specialist Hospital Follow-up appointment confirmed?: No Reason Specialist Follow-Up Not Confirmed: Patient has Specialist Provider Number and will Call for Appointment Do you need transportation to your follow-up appointment?: No Do you understand care options if your condition(s) worsen?: Yes-patient verbalized understanding    SIGNATURE Karena Addison, LPN [facility] Nurse Health Advisor Direct Dial [phone]

## 2022-09-28 ENCOUNTER — Telehealth: Payer: Self-pay

## 2022-09-28 NOTE — Telephone Encounter (Signed)
Spoke to patient and told her that per cardiology, she could hold her Plavix for 5 days prior to her procedure.  Patient agreed.

## 2022-09-28 NOTE — Telephone Encounter (Signed)
-----   Message from Hilarie Fredrickson, MD sent at 09/25/2022 10:58 AM EDT ----- Thanks. Verlon Au, Note. JP ----- Message ----- From: Runell Gess, MD Sent: 09/25/2022  10:57 AM EDT To: Hilarie Fredrickson, MD  John, I think it's fine to hold plavix for 5 days prior to your GI procedures.  JJB ----- Message ----- From: Hilarie Fredrickson, MD Sent: 09/24/2022   3:17 PM EDT To: Runell Gess, MD  JB, Would like to hold her Plavix for 5 days prior to colonoscopy and upper endoscopy, but have her remain on aspirin throughout.  Wanted to check with you to see if this was acceptable. Thanks, JP

## 2022-09-29 LAB — URINE CULTURE: Culture: >=100000 — AB

## 2022-09-30 ENCOUNTER — Telehealth (HOSPITAL_BASED_OUTPATIENT_CLINIC_OR_DEPARTMENT_OTHER): Payer: Self-pay | Admitting: *Deleted

## 2022-09-30 NOTE — Telephone Encounter (Signed)
Post ED Visit - Positive Culture Follow-up  Culture report reviewed by antimicrobial stewardship pharmacist: [facility] []  Enzo Bi, Pharm.D. []  Celedonio Miyamoto, Pharm.D., BCPS AQ-ID []  Garvin Fila, Pharm.D., BCPS []  Georgina Pillion, Pharm.D., BCPS []  [place], [address].D., BCPS, AAHIVP []  Estella Husk, Pharm.D., BCPS, AAHIVP []  Lysle Pearl, PharmD, BCPS []  Phillips Climes, PharmD, BCPS []  Agapito Games, PharmD, BCPS []  Verlan Friends, PharmD []  Mervyn Gay, PharmD, BCPS [x] Jerline Pain, PharmD  [facility] []  Len Childs, PharmD []  Greer Pickerel, PharmD []  Adalberto Cole, PharmD []  Perlie Gold, Rph []  Lonell Face) Jean Rosenthal, PharmD []  Earl Many, PharmD []  Junita Push, PharmD []  Dorna Leitz, PharmD []  Terrilee Files, PharmD []  Lynann Beaver, PharmD []  Keturah Barre, PharmD []  Loralee Pacas, PharmD []  Bernadene Person, PharmD   Positive urine culture Treated with Cephalexin, organism sensitive to the same and no further patient follow-up is required at this time.  Patsey Berthold 09/30/2022, 12:35 PM

## 2022-10-12 ENCOUNTER — Ambulatory Visit: Payer: Medicare Other | Admitting: Physician Assistant

## 2022-10-15 ENCOUNTER — Encounter (HOSPITAL_BASED_OUTPATIENT_CLINIC_OR_DEPARTMENT_OTHER): Payer: Self-pay

## 2022-10-15 ENCOUNTER — Encounter: Payer: Self-pay | Admitting: Internal Medicine

## 2022-10-15 ENCOUNTER — Telehealth: Payer: Self-pay | Admitting: Gastroenterology

## 2022-10-15 ENCOUNTER — Ambulatory Visit (AMBULATORY_SURGERY_CENTER): Payer: Medicare Other | Admitting: Internal Medicine

## 2022-10-15 ENCOUNTER — Other Ambulatory Visit: Payer: Self-pay

## 2022-10-15 ENCOUNTER — Emergency Department (HOSPITAL_BASED_OUTPATIENT_CLINIC_OR_DEPARTMENT_OTHER)
Admission: EM | Admit: 2022-10-15 | Discharge: 2022-10-16 | Disposition: A | Discharge status: Home / Self Care | Payer: Medicare Other | Attending: Emergency Medicine | Admitting: Emergency Medicine

## 2022-10-15 VITALS — BP 98/50 | HR 57 | Temp 97.5°F | Resp 10 | Ht 63.0 in | Wt 155.0 lb

## 2022-10-15 DIAGNOSIS — Z79899 Other long term (current) drug therapy: Secondary | ICD-10-CM | POA: Insufficient documentation

## 2022-10-15 DIAGNOSIS — K317 Polyp of stomach and duodenum: Secondary | ICD-10-CM

## 2022-10-15 DIAGNOSIS — I10 Essential (primary) hypertension: Secondary | ICD-10-CM | POA: Diagnosis not present

## 2022-10-15 DIAGNOSIS — R194 Change in bowel habit: Secondary | ICD-10-CM | POA: Diagnosis not present

## 2022-10-15 DIAGNOSIS — R1013 Epigastric pain: Secondary | ICD-10-CM | POA: Diagnosis present

## 2022-10-15 DIAGNOSIS — K819 Cholecystitis, unspecified: Secondary | ICD-10-CM

## 2022-10-15 DIAGNOSIS — Z7982 Long term (current) use of aspirin: Secondary | ICD-10-CM | POA: Insufficient documentation

## 2022-10-15 DIAGNOSIS — K6289 Other specified diseases of anus and rectum: Secondary | ICD-10-CM | POA: Diagnosis not present

## 2022-10-15 DIAGNOSIS — I2510 Atherosclerotic heart disease of native coronary artery without angina pectoris: Secondary | ICD-10-CM | POA: Insufficient documentation

## 2022-10-15 DIAGNOSIS — K219 Gastro-esophageal reflux disease without esophagitis: Secondary | ICD-10-CM

## 2022-10-15 DIAGNOSIS — R109 Unspecified abdominal pain: Secondary | ICD-10-CM

## 2022-10-15 DIAGNOSIS — R1030 Lower abdominal pain, unspecified: Secondary | ICD-10-CM

## 2022-10-15 DIAGNOSIS — Z7902 Long term (current) use of antithrombotics/antiplatelets: Secondary | ICD-10-CM | POA: Diagnosis not present

## 2022-10-15 DIAGNOSIS — Z87891 Personal history of nicotine dependence: Secondary | ICD-10-CM | POA: Insufficient documentation

## 2022-10-15 MED ORDER — SODIUM CHLORIDE 0.9 % IV SOLN
500.0000 mL | Freq: Once | INTRAVENOUS | Status: DC
Start: 2022-10-15 — End: 2022-10-15

## 2022-10-15 NOTE — Op Note (Signed)
[facility] Patient Name: Heather Merritt Procedure Date: 10/15/2022 2:22 PM MRN: [id number] Endoscopist: Wilhemina Bonito. Marina Goodell , MD, [id number] Age: 67 Referring MD:  Date of Birth: Jun 27, 1955 Gender: Female Account #: [id number] Procedure:                Upper GI endoscopy Indications:              Abdominal pain, Esophageal reflux, atypical chest                            pain Medicines:                Monitored Anesthesia Care Procedure:                Pre-Anesthesia Assessment:                           - Prior to the procedure, a History and Physical                            was performed, and patient medications and                            allergies were reviewed. The patient's tolerance of                            previous anesthesia was also reviewed. The risks                            and benefits of the procedure and the sedation                            options and risks were discussed with the patient.                            All questions were answered, and informed consent                            was obtained. Prior Anticoagulants: The patient has                            taken Plavix (clopidogrel), last dose was 6 days                            prior to procedure. ASA Grade Assessment: III - A                            patient with severe systemic disease. After                            reviewing the risks and benefits, the patient was                            deemed in satisfactory condition to undergo the  procedure.                           After obtaining informed consent, the endoscope was                            passed under direct vision. Throughout the                            procedure, the patient's blood pressure, pulse, and                            oxygen saturations were monitored continuously. The                            Olympus Scope [id number] was introduced through the                             mouth, and advanced to the second part of duodenum.                            The upper GI endoscopy was accomplished without                            difficulty. The patient tolerated the procedure                            well. Scope In: Scope Out: Findings:                 The esophagus was normal.                           The stomach was essentially normal. Small hiatal                            hernia. Incidental small benign fundic gland type                            polyps.                           The examined duodenum was normal.                           The cardia and gastric fundus were normal on                            retroflexion. Complications:            No immediate complications. Estimated Blood Loss:     Estimated blood loss: none. Impression:               - Normal esophagus.                           - Normal stomach. Small hiatal hernia. Incidental  benign fundic gland polyps.                           - Normal examined duodenum.                           - No specimens collected. Recommendation:           - Patient has a contact number available for                            emergencies. The signs and symptoms of potential                            delayed complications were discussed with the                            patient. Return to normal activities tomorrow.                            Written discharge instructions were provided to the                            patient.                           - Resume previous diet.                           - Continue present medications. We discussed taking                            Nexium twice daily if reflux symptoms persist on                            once daily therapy.                           ?" Return to the care of your primary provider                           ?" Resume Plavix today Kenyon Eshleman N. Marina Goodell, MD 10/15/2022 3:04:34 PM This report has been signed  electronically.

## 2022-10-15 NOTE — Telephone Encounter (Signed)
Patient called tonight. Had EGD and colonoscopy today - I reviewed those procedures which were uneventful. She states she felt fine after the exams. Had chick filet for dinner tonight and afterwards has developed some epigastric pain. Ongoing for 1.5 hrs at this point, does not radiate. No nausea / no vomiting. No chest pains. No shortness of breath. No fevers. No rectal bleeding.   She has just taken her nightly dose of pepcid. Does not feel like GERD to her. She has had a cardiac stent placed in the past, does not feel like prior anginal symptoms. Discussed ddx. I think likelihood of complication from procedure such as perforation would be quite low based on her description and time course post procedure. Retained air possible, she is trying to pass gas but not helping too much.   She will hold off on eating anything further tonight. Has some TUMS / Maalox at home which she can try, or some Gas-ex. Will see how she does tonight. Gave precautions to go to the ED if worsening / intractable pain, nausea / vomiting, fevers, shortness of breath, etc. She understands. Hopefully with time this works itself out and further intervention is not needed. [facility] will contact her in the AM to reassess her. However, again gave precautions overnight to go to ED if any worsening or alarm symptoms otherwise as outlined. She agrees.  Heather Merritt.

## 2022-10-15 NOTE — Progress Notes (Signed)
HISTORY OF PRESENT ILLNESS:   Heather Merritt is a 67 y.o. female with multiple medical problems including coronary artery disease with prior stent placement for which she is on chronic Plavix therapy.  She has a history of GERD for which she is on PPI.  She is sent today at the urging of her cardiologist regarding dyspeptic complaints of chest pain.  She states that her previous anginal equivalent was chest discomfort with funny sensation with swallowing issues and reflux.  She is gone to the emergency room several times with similar complaints determined not to be cardiac in origin.  GI evaluation was recommended.  She also describes issues with her bowel habits.  Somewhat irregular.  Vague abdominal pain, particularly in the left lower quadrant.  Some rectal pain.  No overt bleeding.  She takes Nexium once daily and Pepcid at night.  Despite this, she will experience breakthrough reflux symptoms with dietary indiscretion and at other times.  She is concerned.   Previous upper endoscopy in March 2017 was performed to evaluate epigastric pain, gas bloat type symptoms, lower chest pressure with negative cardiac workups, and vague dysphagia.  This all sounds quite similar to the current symptom complex for which evaluation is being requested.   Her last colonoscopy was March 2017.  She did have a history of adenomatous colon polyps previously.  However, the most recent examination revealed diverticulosis but was otherwise negative.   She was seen in this office by the GI physician assistant February 2022 regarding swallowing complaints and chest discomfort.  See that dictation.  A barium esophagram was ordered and performed May 13, 2020.  She was noted to have some changes consistent with dysmotility which were similar to previous exams.  Esophagram was otherwise negative.  Blood work from Aug 06, 2022 shows unremarkable basic metabolic panel.  Normal CBC with hemoglobin 13.1.   REVIEW OF SYSTEMS:    All non-GI ROS negative unless otherwise stated in the HPI except for sinus and allergy trouble, arthritis, back pain       Past Medical History:  Diagnosis Date   Allergy      SEASONAL   ANXIETY 10/28/2007   Arthritis     CAD (coronary artery disease), native coronary artery      a. Cath 10/06/2018 - S/p DES to [facility]; medical therapy for high grade ostial/pro 2nd digonal    Cataracts, bilateral      immature   Chronic back pain     Coronary artery disease     DIVERTICULOSIS, COLON 10/28/2007   Dry skin      red patch on right knee area   GERD 10/28/2007    takes Prevacid daily   H/O hiatal hernia     HLD (hyperlipidemia) 12/12/2017   Hx of colonic polyps     HYPERLIPIDEMIA 10/28/2007    unable to take meds d/t allergies   HYPERTENSION 10/28/2007    takes Diovan daily   Internal hemorrhoids     Irritable bowel syndrome (IBS)     Joint pain     Joint swelling     LIVER FUNCTION TESTS, ABNORMAL, HX OF 10/29/2007   Lumbar disc disease     Migraine     Myocardial infarction (HCC)     Obesity     OSTEOPENIA 10/29/2007   Osteoporosis     Presbyacusis 01/04/2010   Rosacea     Seasonal allergies      takes Clarinex daily   Vertigo  hx of   Vitamin D deficiency      takes Vit D daily               Past Surgical History:  Procedure Laterality Date   ANTERIOR CERVICAL DECOMP/DISCECTOMY FUSION   08/02/2011    Procedure: ANTERIOR CERVICAL DECOMPRESSION/DISCECTOMY FUSION 3 LEVELS;  Surgeon: Mariam Dollar, MD;  Location: [facility];  Service: Neurosurgery;  Laterality: N/A;  Cervical Three-Four, Cervical Four-Five, Cervical Five-Six  Anterior Cervical Decompression Fusion     CARDIAC CATHETERIZATION       COLONOSCOPY   2001/2013   CORONARY STENT INTERVENTION N/A 10/06/2018    Procedure: CORONARY STENT INTERVENTION;  Surgeon: Runell Gess, MD;  Location: [facility];  Service: Cardiovascular;  Laterality: N/A;   ESOPHAGOGASTRODUODENOSCOPY       LEFT HEART CATH AND  CORONARY ANGIOGRAPHY N/A 10/06/2018    Procedure: LEFT HEART CATH AND CORONARY ANGIOGRAPHY;  Surgeon: Runell Gess, MD;  Location: [facility];  Service: Cardiovascular;  Laterality: N/A;   POLYPECTOMY       TONSILLECTOMY       TONSILLECTOMY   at age 89          Social History Heather Merritt  reports that she has quit smoking. Her smoking use included cigarettes. She has never used smokeless tobacco. She reports that she does not currently use alcohol. She reports that she does not use drugs.   family history includes Atrial fibrillation in her father and mother; Diabetes in her maternal grandmother; Heart disease in her mother; Hyperlipidemia in her mother; Hypothyroidism in her maternal grandmother and sister; Stroke in her mother.   Allergies       Allergies  Allergen Reactions   Penicillins Hives, Itching and Rash      Did it involve swelling of the face/tongue/throat, SOB, or low BP? Unknown Did it involve sudden or severe rash/hives, skin peeling, or any reaction on the inside of your mouth or nose? Unknown Did you need to seek medical attention at a hospital or doctor's office? Unknown When did it last happen?      pt cant recall If all above answers are "NO", may proceed with cephalosporin use.     Rosuvastatin Other (See Comments)      REACTION: muscle weakness   Simvastatin Other (See Comments)      REACTION: muscle weakness   Statins Other (See Comments)      Myalgias and weakness   Sulfonamide Derivatives Other (See Comments)      Nausea, vomiting, abd pain   Evolocumab Swelling and Other (See Comments)      Swelling, itchy eyes, mouth swelling, red/puffy/hot face   Misc. Sulfonamide Containing Compounds Other (See Comments)   Other     Penicillin V Potassium Other (See Comments)   Brilinta [Ticagrelor] Rash      Itchy rash to torso   Doxycycline Itching and Rash   Doxycycline Hyclate Itching and Rash   Sulfa Antibiotics Other (See Comments)       Nausea, vomiting, abd pain            PHYSICAL EXAMINATION: Vital signs: BP 126/68   Pulse 64   Ht 5\' 3"  (1.6 m)   Wt 155 lb 3.2 oz (70.4 kg)   SpO2 98%   BMI 27.49 kg/m   Constitutional: generally well-appearing, no acute distress Psychiatric: alert and oriented x3, cooperative Eyes: extraocular movements intact, anicteric, conjunctiva pink Mouth: oral pharynx moist,  no lesions Neck: supple no lymphadenopathy Cardiovascular: heart regular rate and rhythm, no murmur Lungs: clear to auscultation bilaterally Abdomen: soft, nontender, nondistended, no obvious ascites, no peritoneal signs, normal bowel sounds, no organomegaly Rectal: Deferred until colonoscopy Extremities: no clubbing, cyanosis, or lower extremity edema bilaterally Skin: no lesions on visible extremities Neuro: No focal deficits.  Cranial nerves intact   ASSESSMENT:   1.  Atypical chest pain.  This is a patient with coronary artery disease.  This in a patient with GERD.  This in the patient with probable esophageal dysmotility. 2.  GERD with intermittent breakthrough symptoms. 3.  Change in bowel habits 4.  History of adenomatous colon polyps 5.  Multiple general medical problems     PLAN:   1.  Increase Nexium to 40 mg p.o. twice daily.  Prescription rewritten.  Medication risk reviewed. 2.  Initiate Citrucel 2 tablespoons daily for irregular bowel habits 3.  Schedule upper endoscopy to evaluate atypical chest pain.  The patient is high risk given her comorbidities and the need to address Plavix therapy.The nature of the procedure, as well as the risks, benefits, and alternatives were carefully and thoroughly reviewed with the patient. Ample time for discussion and questions allowed. The patient understood, was satisfied, and agreed to proceed. 4.  Reflux precautions 5.  Schedule colonoscopy to evaluate change in bowel habits and lower abdominal discomfort.  Patient is high risk as above.The nature of the  procedure, as well as the risks, benefits, and alternatives were carefully and thoroughly reviewed with the patient. Ample time for discussion and questions allowed. The patient understood, was satisfied, and agreed to proceed. 6.  Would like to have the patient hold Plavix for 5 days prior to her procedures but continue on aspirin throughout.  We will confer with her cardiologist in this regard.

## 2022-10-15 NOTE — Telephone Encounter (Signed)
Uneventful procedures. Thanks Brett Canales for the notification.

## 2022-10-15 NOTE — ED Triage Notes (Addendum)
POV from home, A&O x 4, GCS 15, amb to triage  Had colonoscopy and endoscopy today, abd pain began after eating. Took pepsid and mylanta without relief. Sts bloating and feels like gas.

## 2022-10-15 NOTE — Progress Notes (Signed)
Sedate, gd SR, tolerated procedure well, VSS, report to RN 

## 2022-10-15 NOTE — Patient Instructions (Signed)
Discharge instructions given. Handouts on Diverticulosis,Hemorrhoids and Hiatal Hernia. Resume Plavix today. YOU HAD AN ENDOSCOPIC PROCEDURE TODAY AT THE [facility]:   Refer to the procedure report that was given to you for any specific questions about what was found during the examination.  If the procedure report does not answer your questions, please call your gastroenterologist to clarify.  If you requested that your care partner not be given the details of your procedure findings, then the procedure report has been included in a sealed envelope for you to review at your convenience later.  YOU SHOULD EXPECT: Some feelings of bloating in the abdomen. Passage of more gas than usual.  Walking can help get rid of the air that was put into your GI tract during the procedure and reduce the bloating. If you had a lower endoscopy (such as a colonoscopy or flexible sigmoidoscopy) you may notice spotting of blood in your stool or on the toilet paper. If you underwent a bowel prep for your procedure, you may not have a normal bowel movement for a few days.  Please Note:  You might notice some irritation and congestion in your nose or some drainage.  This is from the oxygen used during your procedure.  There is no need for concern and it should clear up in a day or so.  SYMPTOMS TO REPORT IMMEDIATELY:  Following lower endoscopy (colonoscopy or flexible sigmoidoscopy):  Excessive amounts of blood in the stool  Significant tenderness or worsening of abdominal pains  Swelling of the abdomen that is new, acute  Fever of 100F or higher  Following upper endoscopy (EGD)  Vomiting of blood or coffee ground material  New chest pain or pain under the shoulder blades  Painful or persistently difficult swallowing  New shortness of breath  Fever of 100F or higher  Black, tarry-looking stools  For urgent or emergent issues, a gastroenterologist can be reached at any hour by calling [phone]  [phone]. Do not use MyChart messaging for urgent concerns.    DIET:  We do recommend a small meal at first, but then you may proceed to your regular diet.  Drink plenty of fluids but you should avoid alcoholic beverages for 24 hours.  ACTIVITY:  You should plan to take it easy for the rest of today and you should NOT DRIVE or use heavy machinery until tomorrow (because of the sedation medicines used during the test).    FOLLOW UP: Our staff will call the number listed on your records the next business day following your procedure.  We will call around 7:15- 8:00 am to check on you and address any questions or concerns that you may have regarding the information given to you following your procedure. If we do not reach you, we will leave a message.     If any biopsies were taken you will be contacted by phone or by letter within the next 1-3 weeks.  Please call us at [phone] if you have not heard about the biopsies in 3 weeks.    SIGNATURES/CONFIDENTIALITY: You and/or your care partner have signed paperwork which will be entered into your electronic medical record.  These signatures attest to the fact that that the information above on your After Visit Summary has been reviewed and is understood.  Full responsibility of the confidentiality of this discharge information lies with you and/or your care-partner.

## 2022-10-15 NOTE — Op Note (Signed)
[facility] Patient Name: Heather Merritt Procedure Date: 10/15/2022 2:23 PM MRN: [id number] Endoscopist: Wilhemina Bonito. Marina Goodell , MD, [id number] Age: 67 Referring MD:  Date of Birth: 1956/02/25 Gender: Female Account #: [id number] Procedure:                Colonoscopy Indications:              Lower abdominal pain, Rectal discomfort, Change in                            bowel habits. Previous examination 2017. Personal                            history of adenomatous polyps. Medicines:                Monitored Anesthesia Care Procedure:                Pre-Anesthesia Assessment:                           - Prior to the procedure, a History and Physical                            was performed, and patient medications and                            allergies were reviewed. The patient's tolerance of                            previous anesthesia was also reviewed. The risks                            and benefits of the procedure and the sedation                            options and risks were discussed with the patient.                            All questions were answered, and informed consent                            was obtained. Prior Anticoagulants: The patient has                            taken Plavix (clopidogrel), last dose was 6 days                            prior to procedure. ASA Grade Assessment: III - A                            patient with severe systemic disease. After                            reviewing the risks and benefits, the patient was  deemed in satisfactory condition to undergo the                            procedure.                           After obtaining informed consent, the colonoscope                            was passed under direct vision. Throughout the                            procedure, the patient's blood pressure, pulse, and                            oxygen saturations were monitored continuously. The                             Olympus Scope [id number] was introduced through the                            anus and advanced to the the cecum, identified by                            appendiceal orifice and ileocecal valve. The                            ileocecal valve, appendiceal orifice, and rectum                            were photographed. The quality of the bowel                            preparation was excellent. The colonoscopy was                            performed without difficulty. The patient tolerated                            the procedure well. The bowel preparation used was                            SUPREP via split dose instruction. Scope In: 2:40:33 PM Scope Out: 2:49:21 PM Scope Withdrawal Time: 0 hours 6 minutes 38 seconds  Total Procedure Duration: 0 hours 8 minutes 48 seconds  Findings:                 Multiple diverticula were found in the left colon                            and right colon.                           Hemorrhoids were found during retroflexion. The  hemorrhoids were small. External hemorrhoid tag.                           The exam was otherwise without abnormality on                            direct and retroflexion views. Complications:            No immediate complications. Estimated blood loss:                            None. Estimated Blood Loss:     Estimated blood loss: none. Impression:               - Diverticulosis in the left colon and in the right                            colon.                           - Hemorrhoids.                           - The examination was otherwise normal on direct                            and retroflexion views.                           - No specimens collected. Recommendation:           - Repeat colonoscopy in 10 years for surveillance.                           - Resume Plavix (clopidogrel) today at prior dose.                           - Patient has a contact  number available for                            emergencies. The signs and symptoms of potential                            delayed complications were discussed with the                            patient. Return to normal activities tomorrow.                            Written discharge instructions were provided to the                            patient.                           - Resume previous diet.                           -  Continue present medications.                           - Citrucel fortified fiber. Please take 2                            tablespoons daily and 14 ounces of water or juice.                            This will help regulate your bowels and may make                            cleaning after bowel movements easier. Wilhemina Bonito. Marina Goodell, MD 10/15/2022 2:56:16 PM This report has been signed electronically.

## 2022-10-16 ENCOUNTER — Telehealth: Payer: Self-pay

## 2022-10-16 ENCOUNTER — Emergency Department (HOSPITAL_BASED_OUTPATIENT_CLINIC_OR_DEPARTMENT_OTHER): Payer: Medicare Other

## 2022-10-16 DIAGNOSIS — K819 Cholecystitis, unspecified: Secondary | ICD-10-CM | POA: Diagnosis not present

## 2022-10-16 LAB — URINALYSIS, ROUTINE W REFLEX MICROSCOPIC
Bilirubin Urine: NEGATIVE
Glucose, UA: NEGATIVE mg/dL
Hgb urine dipstick: NEGATIVE
Ketones, ur: NEGATIVE mg/dL
Leukocytes,Ua: NEGATIVE
Nitrite: NEGATIVE
Protein, ur: NEGATIVE mg/dL
Specific Gravity, Urine: >=1.03 (ref 1.005–1.030)
pH: 5.5 (ref 5.0–8.0)

## 2022-10-16 LAB — CBC WITH DIFFERENTIAL/PLATELET
Abs Immature Granulocytes: 0.02 10*3/uL (ref 0.00–0.07)
Basophils Absolute: 0 10*3/uL (ref 0.0–0.1)
Basophils Relative: 0 %
Eosinophils Absolute: 0.1 10*3/uL (ref 0.0–0.5)
Eosinophils Relative: 1 %
HCT: 35.1 % — ABNORMAL LOW (ref 36.0–46.0)
Hemoglobin: 11.7 g/dL — ABNORMAL LOW (ref 12.0–15.0)
Immature Granulocytes: 0 %
Lymphocytes Relative: 20 %
Lymphs Abs: 0.9 10*3/uL (ref 0.7–4.0)
MCH: 30.3 pg (ref 26.0–34.0)
MCHC: 33.3 g/dL (ref 30.0–36.0)
MCV: 90.9 fL (ref 80.0–100.0)
Monocytes Absolute: 0.4 10*3/uL (ref 0.1–1.0)
Monocytes Relative: 8 %
Neutro Abs: 3.3 10*3/uL (ref 1.7–7.7)
Neutrophils Relative %: 71 %
Platelets: 261 10*3/uL (ref 150–400)
RBC: 3.86 MIL/uL — ABNORMAL LOW (ref 3.87–5.11)
RDW: 12.2 % (ref 11.5–15.5)
WBC: 4.7 10*3/uL (ref 4.0–10.5)
nRBC: 0 % (ref 0.0–0.2)

## 2022-10-16 LAB — COMPREHENSIVE METABOLIC PANEL
ALT: 72 U/L — ABNORMAL HIGH (ref 0–44)
AST: 73 U/L — ABNORMAL HIGH (ref 15–41)
Albumin: 4 g/dL (ref 3.5–5.0)
Alkaline Phosphatase: 47 U/L (ref 38–126)
Anion gap: 10 (ref 5–15)
BUN: 14 mg/dL (ref 8–23)
CO2: 28 mmol/L (ref 22–32)
Calcium: 8.8 mg/dL — ABNORMAL LOW (ref 8.9–10.3)
Chloride: 102 mmol/L (ref 98–111)
Creatinine, Ser: 0.74 mg/dL (ref 0.44–1.00)
GFR, Estimated: >60 mL/min (ref >60)
Glucose, Bld: 115 mg/dL — ABNORMAL HIGH (ref 70–99)
Potassium: 3.9 mmol/L (ref 3.5–5.1)
Sodium: 140 mmol/L (ref 135–145)
Total Bilirubin: 0.6 mg/dL (ref 0.3–1.2)
Total Protein: 6.5 g/dL (ref 6.5–8.1)

## 2022-10-16 LAB — LIPASE, BLOOD: Lipase: 40 U/L (ref 11–51)

## 2022-10-16 MED ORDER — ALUM & MAG HYDROXIDE-SIMETH 200-200-20 MG/5ML PO SUSP
30.0000 mL | Freq: Once | ORAL | Status: AC
  Administered 2022-10-16: 30 mL via ORAL
  Filled 2022-10-16: qty 30

## 2022-10-16 MED ORDER — OXYCODONE HCL 5 MG PO TABS: 5.0000 mg | ORAL_TABLET | Freq: Four times a day (QID) | ORAL | 0 refills | Status: DC | PRN

## 2022-10-16 MED ORDER — SODIUM CHLORIDE 0.9 % IV BOLUS
1000.0000 mL | Freq: Once | INTRAVENOUS | Status: AC
  Administered 2022-10-16: 1000 mL via INTRAVENOUS

## 2022-10-16 MED ORDER — IOHEXOL 300 MG/ML  SOLN
100.0000 mL | Freq: Once | INTRAMUSCULAR | Status: AC | PRN
  Administered 2022-10-16: 100 mL via INTRAVENOUS

## 2022-10-16 MED ORDER — KETOROLAC TROMETHAMINE 15 MG/ML IJ SOLN
15.0000 mg | Freq: Once | INTRAMUSCULAR | Status: AC
  Administered 2022-10-16: 15 mg via INTRAVENOUS
  Filled 2022-10-16: qty 1

## 2022-10-16 MED ORDER — CEPHALEXIN 500 MG PO CAPS: 500.0000 mg | ORAL_CAPSULE | Freq: Three times a day (TID) | ORAL | 0 refills | Status: AC

## 2022-10-16 MED ORDER — METRONIDAZOLE 500 MG PO TABS: 500.0000 mg | ORAL_TABLET | Freq: Two times a day (BID) | ORAL | 0 refills | Status: DC

## 2022-10-16 NOTE — ED Provider Notes (Signed)
[facility] Provider Note  CSN: [id number] Arrival date & time: 10/15/22 2321  Chief Complaint(s) Abdominal Pain  HPI Heather Merritt is a 67 y.o. female with past medical history as below, significant for CAD, diverticulosis, anxiety, HLD, HTN, IBS who presents to the ED with complaint of epigastric pain patient had colonoscopy/endoscopy yesterday, she tried to eat lunch and had severe epigastric discomfort.  Took Mylanta and some Pepcid which provided mild relief of her symptoms.  Symptoms still ongoing.  Sharp, stabbing, nonradiating.  Epigastric, right upper quadrant.  Denies similar complaints in the past with endoscopy/colonoscopy.  Endoscopy yesterday by Dr. Marina Goodell, procedure was normal per patient.  Past Medical History Past Medical History:  Diagnosis Date   Allergy    SEASONAL   ANXIETY 10/28/2007   Arthritis    CAD (coronary artery disease), native coronary artery    a. Cath 10/06/2018 - S/p DES to [facility]; medical therapy for high grade ostial/pro 2nd digonal    Cataracts, bilateral    immature   Chronic back pain    Coronary artery disease    DIVERTICULOSIS, COLON 10/28/2007   Dry skin    red patch on right knee area   GERD 10/28/2007   takes Prevacid daily   H/O hiatal hernia    HLD (hyperlipidemia) 12/12/2017   Hx of colonic polyps    HYPERLIPIDEMIA 10/28/2007   unable to take meds d/t allergies   HYPERTENSION 10/28/2007   takes Diovan daily   Internal hemorrhoids    Irritable bowel syndrome (IBS)    Joint pain    Joint swelling    LIVER FUNCTION TESTS, ABNORMAL, HX OF 10/29/2007   Lumbar disc disease    Migraine    Myocardial infarction (HCC)    Obesity    OSTEOPENIA 10/29/2007   Osteoporosis    Presbyacusis 01/04/2010   Rosacea    Seasonal allergies    takes Clarinex daily   Vertigo    hx of   Vitamin D deficiency    takes Vit D daily   Patient Active Problem List   Diagnosis Date Noted   Complex cyst of left  ovary 05/01/2022   UTI (urinary tract infection) 03/07/2022   Dysfunction of right eustachian tube 11/09/2021   Tinnitus aurium, 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   URI (upper respiratory infection) 03/19/2021   Lesion of ovary 07/26/2020   Osteopenia 07/26/2020   Polyp of corpus uteri 07/26/2020   Body mass index (BMI) 31.0-31.9, adult 03/17/2020   Hyperglycemia 10/15/2018   NSTEMI (non-ST elevated myocardial infarction) (HCC) 10/03/2018   Aphthous ulcer of mouth 12/19/2017   HLD (hyperlipidemia) 12/12/2017   Microhematuria 06/05/2016   Vertigo 03/09/2016   Rhinitis, chronic 12/31/2015   Bilateral temporomandibular joint pain 12/31/2015   PAC (premature atrial contraction) 04/29/2015   History of colonic polyps 04/29/2015   Neck pain 02/11/2015   Degeneration of intervertebral disc of cervical spine without prolapsed disc 01/25/2015   Pain in the chest 04/15/2014   Lumbosacral radiculitis 04/07/2013   Thoracic radiculopathy 01/26/2013   Displacement of lumbar intervertebral disc without myelopathy 10/14/2012   Osteoporosis 02/27/2011   Lumbar disc disease 02/27/2011   Back pain 02/27/2011   Encounter for well adult exam with abnormal findings 02/24/2011   Presbyacusis 01/04/2010   LIVER FUNCTION TESTS, ABNORMAL, HX OF 10/29/2007   Essential (primary) hypertension 10/28/2007   Gastro-esophageal reflux 10/28/2007   DIVERTICULOSIS, COLON 10/28/2007   Home Medication(s) Prior to Admission medications   Medication Sig  Start Date End Date Taking? Authorizing Provider  cephALEXin (KEFLEX) 500 MG capsule Take 1 capsule (500 mg total) by mouth 3 (three) times daily for 7 days. 10/16/22 10/23/22 Yes Tanda Rockers A, DO  metroNIDAZOLE (FLAGYL) 500 MG tablet Take 1 tablet (500 mg total) by mouth 2 (two) times daily. 10/16/22  Yes Tanda Rockers A, DO  oxyCODONE (ROXICODONE) 5 MG immediate release tablet Take 1 tablet (5 mg total) by mouth every 6 (six) hours as needed for severe pain. 10/16/22  Yes Tanda Rockers  A, DO  aspirin 81 MG tablet Take 81 mg by mouth daily.    [provider]  atorvastatin (LIPITOR) 10 MG tablet TAKE 1 TABLET EVERY DAY 01/31/22   Hilty, Lisette Abu, MD  Calcium Citrate (CITRACAL PO) Take 2 tablets by mouth daily.     [provider]  Cholecalciferol (VITAMIN D) 2000 UNITS CAPS Take 2,000 Units by mouth daily.    [provider]  clopidogrel (PLAVIX) 75 MG tablet Take 75 mg by mouth daily.    [provider]  esomeprazole (NEXIUM) 40 MG capsule Take 1 capsule (40 mg total) by mouth in the morning and at bedtime. 09/24/22   Hilarie Fredrickson, MD  ezetimibe (ZETIA) 10 MG tablet TAKE 1 TABLET EVERY DAY 01/31/22   Chrystie Nose, MD  famotidine (PEPCID) 40 MG tablet TAKE 1 TABLET EVERY DAY 08/09/21   Esterwood, Amy S, PA-C  metoprolol tartrate (LOPRESSOR) 25 MG tablet TAKE 1/2 TABLET TWICE DAILY 01/31/22   Corwin Levins, MD  metroNIDAZOLE (METROGEL) 0.75 % gel Apply 1 application topically at bedtime. 06/11/17   Corwin Levins, MD  Multiple Vitamin (MULITIVITAMIN WITH MINERALS) TABS Take 1 tablet by mouth daily.    [provider]  NEXLETOL 180 MG TABS TAKE 1 TABLET EVERY DAY 11/03/21   Hilty, Lisette Abu, MD  nitroGLYCERIN (NITROSTAT) 0.4 MG SL tablet Place 1 tablet (0.4 mg total) under the tongue every 5 (five) minutes x 3 doses as needed for chest pain. Patient not taking: Reported on 10/15/2022 04/07/21   Runell Gess, MD  phenazopyridine (PYRIDIUM) 200 MG tablet Take 1 tablet (200 mg total) by mouth 3 (three) times daily as needed for pain. Patient not taking: Reported on 10/15/2022 09/26/22   Corwin Levins, MD  pimecrolimus (ELIDEL) 1 % cream Apply 1 application topically every morning. 06/11/17   Corwin Levins, MD  valsartan-hydrochlorothiazide (DIOVAN-HCT) 160-12.5 MG tablet TAKE 1 TABLET EVERY DAY 01/31/22   Corwin Levins, MD                                                                                                                                     Past Surgical History Past Surgical History:  Procedure Laterality Date   ANTERIOR CERVICAL DECOMP/DISCECTOMY FUSION  08/02/2011   Procedure: ANTERIOR CERVICAL DECOMPRESSION/DISCECTOMY FUSION 3 LEVELS;  Surgeon: Mariam Dollar, MD;  Location:  [facility];  Service: Neurosurgery;  Laterality: N/A;  Cervical Three-Four, Cervical Four-Five, Cervical Five-Six  Anterior Cervical Decompression Fusion    CARDIAC CATHETERIZATION     COLONOSCOPY  2001/2013   CORONARY STENT INTERVENTION N/A 10/06/2018   Procedure: CORONARY STENT INTERVENTION;  Surgeon: Runell Gess, MD;  Location: [facility];  Service: Cardiovascular;  Laterality: N/A;   ESOPHAGOGASTRODUODENOSCOPY     LEFT HEART CATH AND CORONARY ANGIOGRAPHY N/A 10/06/2018   Procedure: LEFT HEART CATH AND CORONARY ANGIOGRAPHY;  Surgeon: Runell Gess, MD;  Location: [facility];  Service: Cardiovascular;  Laterality: N/A;   POLYPECTOMY     TONSILLECTOMY     TONSILLECTOMY  at age 6   Family History Family History  Problem Relation Age of Onset   Atrial fibrillation Mother    Hyperlipidemia Mother    Stroke Mother    Heart disease Mother    Atrial fibrillation Father    Hypothyroidism Sister    Hypothyroidism Maternal Grandmother    Diabetes Maternal Grandmother    Anesthesia problems Neg Hx    Hypotension Neg Hx    Malignant hyperthermia Neg Hx    Pseudochol deficiency Neg Hx    Colon cancer Neg Hx    Stomach cancer Neg Hx    Rectal cancer Neg Hx    Esophageal cancer Neg Hx     Social History Social History   Tobacco Use   Smoking status: Former    Types: Cigarettes   Smokeless tobacco: Never   Tobacco comments:    quit in 37yrs ago  Vaping Use   Vaping status: Never Used  Substance Use Topics   Alcohol use: Not Currently    Alcohol/week: 0.0 standard drinks of alcohol    Comment: rare   Drug use: No   Allergies Penicillins, Rosuvastatin, Simvastatin, Statins, Sulfonamide derivatives, Evolocumab,  Misc. sulfonamide containing compounds, Other, Penicillin v potassium, Brilinta [ticagrelor], Doxycycline, Doxycycline hyclate, and Sulfa antibiotics  Review of Systems Review of Systems  Constitutional:  Negative for chills and fever.  HENT:  Negative for facial swelling and trouble swallowing.   Eyes:  Negative for photophobia and visual disturbance.  Respiratory:  Negative for cough and shortness of breath.   Cardiovascular:  Negative for chest pain and palpitations.  Gastrointestinal:  Negative for abdominal pain, nausea and vomiting.  Endocrine: Negative for polydipsia and polyuria.  Genitourinary:  Negative for difficulty urinating and hematuria.  Musculoskeletal:  Negative for gait problem and joint swelling.  Skin:  Negative for pallor and rash.  Neurological:  Negative for syncope and headaches.  Psychiatric/Behavioral:  Negative for agitation and confusion.     Physical Exam Vital Signs  I have reviewed the triage vital signs BP 122/60   Pulse 62   Temp 97.7 F (36.5 C)   Resp 18   Ht 5\' 3"  (1.6 m)   Wt 70.3 kg   SpO2 100%   BMI 27.46 kg/m  Physical Exam Vitals and nursing note reviewed.  Constitutional:      General: She is not in acute distress.    Appearance: Normal appearance. She is well-developed. She is not ill-appearing.  HENT:     Head: Normocephalic and atraumatic.     Right Ear: External ear normal.     Left Ear: External ear normal.     Nose: Nose normal.     Mouth/Throat:     Mouth: Mucous membranes are moist.  Eyes:     General: No scleral icterus.  Right eye: No discharge.        Left eye: No discharge.  Cardiovascular:     Rate and Rhythm: Normal rate and regular rhythm.     Pulses: Normal pulses.     Heart sounds: Normal heart sounds.  Pulmonary:     Effort: Pulmonary effort is normal. No respiratory distress.     Breath sounds: Normal breath sounds. No stridor.  Abdominal:     General: Abdomen is flat. There is no distension.      Tenderness: There is abdominal tenderness in the right upper quadrant and epigastric area. Positive signs include Murphy's sign. Negative signs include Rovsing's sign.    Musculoskeletal:     Cervical back: No rigidity.     Right lower leg: No edema.     Left lower leg: No edema.  Skin:    General: Skin is warm and dry.     Capillary Refill: Capillary refill takes less than 2 seconds.  Neurological:     Mental Status: She is alert.  Psychiatric:        Mood and Affect: Mood normal.        Behavior: Behavior normal. Behavior is cooperative.     ED Results and Treatments Labs (all labs ordered are listed, but only abnormal results are displayed) Labs Reviewed  CBC WITH DIFFERENTIAL/PLATELET - Abnormal; Notable for the following components:      Result Value   RBC 3.86 (*)    Hemoglobin 11.7 (*)    HCT 35.1 (*)    All other components within normal limits  COMPREHENSIVE METABOLIC PANEL - Abnormal; Notable for the following components:   Glucose, Bld 115 (*)    Calcium 8.8 (*)    AST 73 (*)    ALT 72 (*)    All other components within normal limits  LIPASE, BLOOD  URINALYSIS, ROUTINE W REFLEX MICROSCOPIC                                                                                                                          Radiology CT ABDOMEN PELVIS W CONTRAST  Result Date: 10/16/2022 CLINICAL DATA:  Right upper quadrant epigastric abdominal pain EXAM: CT ABDOMEN AND PELVIS WITH CONTRAST TECHNIQUE: Multidetector CT imaging of the abdomen and pelvis was performed using the standard protocol following bolus administration of intravenous contrast. RADIATION DOSE REDUCTION: This exam was performed according to the departmental dose-optimization program which includes automated exposure control, adjustment of the mA and/or kV according to patient size and/or use of iterative reconstruction technique. CONTRAST:  OMNIPAQUE IOHEXOL 300 MG/ML  SOLN COMPARISON:  None Available.  FINDINGS: Lower chest: Extensive right coronary artery calcification. Global cardiac size within normal limits. Visualized lung bases are clear., Hepatobiliary: The gallbladder is mildly distended and there is trace pericholecystic inflammatory stranding which may reflect changes of early acute cholecystitis. No pericholecystic fluid collections are identified. The liver is unremarkable. No intra or extrahepatic biliary ductal dilation. Pancreas: Unremarkable Spleen: Unremarkable  Adrenals/Urinary Tract: Adrenal glands are unremarkable. Kidneys are normal, without renal calculi, focal lesion, or hydronephrosis. Bladder is unremarkable. Stomach/Bowel: Stomach is within normal limits. Appendix appears normal. No evidence of bowel wall thickening, distention, or inflammatory changes. Vascular/Lymphatic: Aortic atherosclerosis. No enlarged abdominal or pelvic lymph nodes. Reproductive: Uterus and bilateral adnexa are unremarkable. Other: No abdominal wall hernia or abnormality. No abdominopelvic ascites. Musculoskeletal: Degenerative changes are seen within the lumbar spine. No acute bone abnormality. No lytic or blastic bone lesion. IMPRESSION: 1. Mild gallbladder distention with trace pericholecystic inflammatory stranding which may reflect changes of early acute cholecystitis. Correlation with liver enzymes and possible right upper quadrant sonography may be helpful for confirmation. 2. Extensive right coronary artery calcification. 3. Aortic atherosclerosis. Aortic Atherosclerosis (ICD10-I70.0). Electronically Signed   By: Helyn Numbers M.D.   On: 10/16/2022 01:34    Pertinent labs & imaging results that were available during my care of the patient were reviewed by me and considered in my medical decision making (see MDM for details).  Medications Ordered in ED Medications  sodium chloride 0.9 % bolus 1,000 mL (1,000 mLs Intravenous New Bag/Given 10/16/22 0117)  ketorolac (TORADOL) 15 MG/ML injection 15 mg (15  mg Intravenous Given 10/16/22 0052)  alum & mag hydroxide-simeth (MAALOX/MYLANTA) 200-200-20 MG/5ML suspension 30 mL (30 mLs Oral Given 10/16/22 0051)  iohexol (OMNIPAQUE) 300 MG/ML solution 100 mL (100 mLs Intravenous Contrast Given 10/16/22 0104)                                                                                                                                     Procedures Procedures  (including critical care time)  Medical Decision Making / ED Course    Medical Decision Making:    LYNDSY GILBERTO is a 67 y.o. female with past medical history as below, significant for CAD, diverticulosis, anxiety, HLD, HTN, IBS who presents to the ED with complaint of epigastric pain.. The complaint involves an extensive differential diagnosis and also carries with it a high risk of complications and morbidity.  Serious etiology was considered. Ddx includes but is not limited to: Differential diagnosis includes but is not exclusive to acute cholecystitis, intrathoracic causes for epigastric abdominal pain, gastritis, duodenitis, pancreatitis, small bowel or large bowel obstruction, abdominal aortic aneurysm, hernia, gastritis, etc.   Complete initial physical exam performed, notably the patient  was no acute distress, sitting upright, abdomen TTP to epigastrium/ruq, nontender..    Reviewed and confirmed nursing documentation for past medical history, family history, social history.  Vital signs reviewed.    Clinical Course as of 10/16/22 0204  Tue Oct 16, 2022  0058 Hemoglobin(!): 11.7 Around 13 two months ago. She denies and BRBPR or hematemesis, no abnormal rashes or abnormal bleeding noted  [SG]  0137 AST(!): 73 [SG]  0137 ALT(!): 72 Increased from prior  [SG]  0137 CTAP concerning for early acute cholecystitis, she has RUQ pain (+ murphy  sign), post prandial pain after eating fried chicken earlier, no fever/jaundice, tbili is wnl although AST/LFT mild elev [SG]  0155 Discussed w/ pt  at length in regards to imaging results and concern for early acute cholecystitis, I recommended admission for surgical eval to which pt refuses. She wants to go home and consider her options and would prefer to f/u with surgery in the office. Recommend low fat diet, give analgesics, abx for home, strict return precautions were emphasized  [SG]    Clinical Course User Index [SG] Sloan Leiter, DO   She is feeling much better on recheck, she still has tenderness to right upper quadrant, positive Murphy sign.  Discussion with patient as above.  She is not interested in pursuing surgical management.  Will start oral antibiotics, recommend low-fat diet, follow-up with surgery in the office.  Advised to return if she changes her mind.  Strict return precautions were emphasized.  Pt refuses admission, does not want surgery at this time; will follow up in the office  The patient improved significantly and was discharged in stable condition. Detailed discussions were had with the patient regarding current findings, and need for close f/u with PCP or on call doctor. The patient has been instructed to return immediately if the symptoms worsen in any way for re-evaluation. Patient verbalized understanding and is in agreement with current care plan. All questions answered prior to discharge.          Additional history obtained: -Additional history obtained from na -External records from outside source obtained and reviewed including: Chart review including previous notes, labs, imaging, consultation notes including primary care recommendation, prior ED visits, prior labs. Reviewed transcription from Dr. Marina Goodell, endoscopy with small hiatal hernia, otherwise stable, no biopsy.  Colonoscopy shows diverticulosis, hemorrhoids, otherwise stable    Lab Tests: -I ordered, reviewed, and interpreted labs.   The pertinent results include:   Labs Reviewed  CBC WITH DIFFERENTIAL/PLATELET - Abnormal; Notable  for the following components:      Result Value   RBC 3.86 (*)    Hemoglobin 11.7 (*)    HCT 35.1 (*)    All other components within normal limits  COMPREHENSIVE METABOLIC PANEL - Abnormal; Notable for the following components:   Glucose, Bld 115 (*)    Calcium 8.8 (*)    AST 73 (*)    ALT 72 (*)    All other components within normal limits  LIPASE, BLOOD  URINALYSIS, ROUTINE W REFLEX MICROSCOPIC    Notable for as above  EKG   EKG Interpretation Date/Time:    Ventricular Rate:    PR Interval:    QRS Duration:    QT Interval:    QTC Calculation:   R Axis:      Text Interpretation:           Imaging Studies ordered: I ordered imaging studies including CTAP I independently visualized the following imaging with scope of interpretation limited to determining acute life threatening conditions related to emergency care; findings noted above, significant for cholecystitis  I independently visualized and interpreted imaging. I agree with the radiologist interpretation   Medicines ordered and prescription drug management: Meds ordered this encounter  Medications   sodium chloride 0.9 % bolus 1,000 mL   ketorolac (TORADOL) 15 MG/ML injection 15 mg   alum & mag hydroxide-simeth (MAALOX/MYLANTA) 200-200-20 MG/5ML suspension 30 mL   iohexol (OMNIPAQUE) 300 MG/ML solution 100 mL   oxyCODONE (ROXICODONE) 5 MG immediate release tablet  Sig: Take 1 tablet (5 mg total) by mouth every 6 (six) hours as needed for severe pain.    Dispense:  12 tablet    Refill:  0   cephALEXin (KEFLEX) 500 MG capsule    Sig: Take 1 capsule (500 mg total) by mouth 3 (three) times daily for 7 days.    Dispense:  21 capsule    Refill:  0   metroNIDAZOLE (FLAGYL) 500 MG tablet    Sig: Take 1 tablet (500 mg total) by mouth 2 (two) times daily.    Dispense:  14 tablet    Refill:  0    -I have reviewed the patients home medicines and have made adjustments as needed   Consultations  Obtained: na   Cardiac Monitoring: The patient was maintained on a cardiac monitor.  I personally viewed and interpreted the cardiac monitored which showed an underlying rhythm of: nsr  Social Determinants of Health:  Diagnosis or treatment significantly limited by social determinants of health: former smoker   Reevaluation: After the interventions noted above, I reevaluated the patient and found that they have improved  Co morbidities that complicate the patient evaluation  Past Medical History:  Diagnosis Date   Allergy    SEASONAL   ANXIETY 10/28/2007   Arthritis    CAD (coronary artery disease), native coronary artery    a. Cath 10/06/2018 - S/[facility] DES to [facility]; medical therapy for high grade ostial/pro 2nd digonal    Cataracts, bilateral    immature   Chronic back pain    Coronary artery disease    DIVERTICULOSIS, COLON 10/28/2007   Dry skin    red patch on right knee area   GERD 10/28/2007   takes Prevacid daily   H/O hiatal hernia    HLD (hyperlipidemia) 12/12/2017   Hx of colonic polyps    HYPERLIPIDEMIA 10/28/2007   unable to take meds d/t allergies   HYPERTENSION 10/28/2007   takes Diovan daily   Internal hemorrhoids    Irritable bowel syndrome (IBS)    Joint pain    Joint swelling    LIVER FUNCTION TESTS, ABNORMAL, HX OF 10/29/2007   Lumbar disc disease    Migraine    Myocardial infarction (HCC)    Obesity    OSTEOPENIA 10/29/2007   Osteoporosis    Presbyacusis 01/04/2010   Rosacea    Seasonal allergies    takes Clarinex daily   Vertigo    hx of   Vitamin D deficiency    takes Vit D daily      Dispostion: Disposition decision including need for hospitalization was considered, and patient discharged from emergency department.    Final Clinical Impression(s) / ED Diagnoses Final diagnoses:  Cholecystitis  Abdominal pain, unspecified abdominal location     This chart was dictated using voice recognition software.  Despite best efforts to proofread,   errors can occur which can change the documentation meaning.    Sloan Leiter, DO 10/16/22 [phone]

## 2022-10-16 NOTE — ED Notes (Signed)
Pt unable to void at this time. 

## 2022-10-16 NOTE — ED Notes (Signed)
Patient transported to CT 

## 2022-10-16 NOTE — Telephone Encounter (Signed)
  Follow up Call-     10/15/2022    2:07 PM  Call back number  Post procedure Call Back phone  # [phone]  Permission to leave phone message Yes     Patient questions:  Do you have a fever, pain , or abdominal swelling? No. Pain Score  0 *  Have you tolerated food without any problems? No. Patient states she had pain after eating, went to ED and learned she has gallstones. Patient plans to follow up with PCP today for further treatment related to the gallstones.  Have you been able to return to your normal activities? Yes.    Do you have any questions about your discharge instructions: Diet   No. Medications  No. Follow up visit  No.  Do you have questions or concerns about your Care? No.  Actions: * If pain score is 4 or above: No action needed, pain <4.

## 2022-10-16 NOTE — Discharge Instructions (Addendum)
It was a pleasure caring for you today in the emergency department.  Please return to the emergency department for any worsening or worrisome symptoms.  If you develop any worsening symptoms please return for evaluation.  If you develop fever, nausea, skin yellowing, severe abdominal pain, any worsening / worrisome symptoms please return.  Please also return if you change your mind in regards to surgical management of your gallbladder/cholecystitis. Otherwise please see surgeon within the next 2 weeks as an outpatient.

## 2022-10-17 ENCOUNTER — Telehealth: Payer: Self-pay

## 2022-10-17 NOTE — Transitions of Care (Post Inpatient/ED Visit) (Signed)
10/17/2022  Name: Heather Merritt MRN: [id number] DOB: [birth year]  Today's TOC FU Call Status: Today's TOC FU Call Status:: Successful TOC FU Call Competed TOC FU Call Complete Date: 10/17/22  Transition Care Management Follow-up Telephone Call Date of Discharge: 10/16/22 Discharge Facility: [facility] Type of Discharge: Emergency Department Reason for ED Visit: Other: (cholecystitis) How have you been since you were released from the hospital?: Better Any questions or concerns?: No  Items Reviewed: Did you receive and understand the discharge instructions provided?: Yes Medications obtained,verified, and reconciled?: Yes (Medications Reviewed) Any new allergies since your discharge?: No Dietary orders reviewed?: Yes Do you have support at home?: No  Medications Reviewed Today: Medications Reviewed Today     Reviewed by Karena Addison, LPN (Licensed Practical Nurse) on 10/17/22 at 1109  Med List Status: <None>   Medication Order Taking? Sig Documenting Provider Last Dose Status Informant  aspirin 81 MG tablet [id number] No Take 81 mg by mouth daily. [provider] 10/15/2022 Active Self  atorvastatin (LIPITOR) 10 MG tablet [id number] No TAKE 1 TABLET EVERY DAY Hilty, Lisette Abu, MD 10/14/2022 Active   Calcium Citrate (CITRACAL PO) [id number] No Take 2 tablets by mouth daily.  [provider] Past Week Active Self  cephALEXin (KEFLEX) 500 MG capsule [id number]  Take 1 capsule (500 mg total) by mouth 3 (three) times daily for 7 days. Sloan Leiter, DO  Active   Cholecalciferol (VITAMIN D) 2000 UNITS CAPS [id number] No Take 2,000 Units by mouth daily. [provider] Past Week Active Self  clopidogrel (PLAVIX) 75 MG tablet [id number] No Take 75 mg by mouth daily. [provider] 10/09/2022 Active Self  esomeprazole (NEXIUM) 40 MG capsule [id number] No Take 1 capsule (40 mg total) by mouth in the morning and at bedtime. Hilarie Fredrickson, MD 10/15/2022  Active   ezetimibe (ZETIA) 10 MG tablet [id number] No TAKE 1 TABLET EVERY DAY Chrystie Nose, MD 10/14/2022 Active   famotidine (PEPCID) 40 MG tablet [id number] No TAKE 1 TABLET EVERY DAY Esterwood, Amy S, PA-C 10/14/2022 Active   metoprolol tartrate (LOPRESSOR) 25 MG tablet [id number] No TAKE 1/2 TABLET TWICE DAILY Corwin Levins, MD 10/15/2022 Active   metroNIDAZOLE (FLAGYL) 500 MG tablet [id number]  Take 1 tablet (500 mg total) by mouth 2 (two) times daily. Tanda Rockers A, DO  Active   metroNIDAZOLE (METROGEL) 0.75 % gel [id number] No Apply 1 application topically at bedtime. Corwin Levins, MD 10/14/2022 Active Self  Multiple Vitamin (MULITIVITAMIN WITH MINERALS) TABS [id number] No Take 1 tablet by mouth daily. [provider] Past Week Active Self  NEXLETOL 180 MG TABS [id number] No TAKE 1 TABLET EVERY DAY Hilty, Lisette Abu, MD 10/14/2022 Active   nitroGLYCERIN (NITROSTAT) 0.4 MG SL tablet [id number] No Place 1 tablet (0.4 mg total) under the tongue every 5 (five) minutes x 3 doses as needed for chest pain.  Patient not taking: Reported on 10/15/2022   Runell Gess, MD Not Taking Active            Med Note [facility], REBECCA G   Tue Aug 07, 2022  9:58 AM) Never needed  oxyCODONE (ROXICODONE) 5 MG immediate release tablet [id number]  Take 1 tablet (5 mg total) by mouth every 6 (six) hours as needed for severe pain. Sloan Leiter, DO  Active   phenazopyridine (PYRIDIUM) 200 MG tablet [id number] No Take 1 tablet (200 mg total) by mouth 3 (three) times daily as needed for pain.  Patient  not taking: Reported on 10/15/2022   Corwin Levins, MD Not Taking Active   pimecrolimus (ELIDEL) 1 % cream [id number] No Apply 1 application topically every morning. Corwin Levins, MD 10/14/2022 Active Self  valsartan-hydrochlorothiazide (DIOVAN-HCT) 160-12.5 MG tablet [id number] No TAKE 1 TABLET EVERY DAY Corwin Levins, MD 10/15/2022 Active             Home Care and Equipment/Supplies: Were Home Health Services  Ordered?: NA Any new equipment or medical supplies ordered?: NA  Functional Questionnaire: Do you need assistance with bathing/showering or dressing?: No Do you need assistance with meal preparation?: No Do you need assistance with eating?: No Do you have difficulty maintaining continence: No Do you need assistance with getting out of bed/getting out of a chair/moving?: No Do you have difficulty managing or taking your medications?: No  Follow up appointments reviewed: PCP Follow-up appointment confirmed?: NA Specialist Hospital Follow-up appointment confirmed?: No Reason Specialist Follow-Up Not Confirmed: Patient has Specialist Provider Number and will Call for Appointment Do you need transportation to your follow-up appointment?: No Do you understand care options if your condition(s) worsen?: Yes-patient verbalized understanding    SIGNATURE Karena Addison, LPN [facility] Nurse Health Advisor Direct Dial [phone]

## 2022-10-23 ENCOUNTER — Ambulatory Visit: Payer: Medicare Other | Admitting: Internal Medicine

## 2022-10-27 ENCOUNTER — Other Ambulatory Visit: Payer: Self-pay | Admitting: Physician Assistant

## 2022-10-27 ENCOUNTER — Other Ambulatory Visit: Payer: Self-pay | Admitting: Internal Medicine

## 2022-10-29 ENCOUNTER — Other Ambulatory Visit: Payer: Self-pay

## 2022-10-31 ENCOUNTER — Telehealth: Payer: Self-pay | Admitting: *Deleted

## 2022-10-31 ENCOUNTER — Telehealth: Payer: Self-pay | Admitting: Internal Medicine

## 2022-10-31 ENCOUNTER — Other Ambulatory Visit: Payer: Self-pay | Admitting: General Surgery

## 2022-10-31 DIAGNOSIS — K8020 Calculus of gallbladder without cholecystitis without obstruction: Secondary | ICD-10-CM

## 2022-10-31 NOTE — Telephone Encounter (Signed)
Inbound call from patient requesting a call  back to discuss gallbladder surgery. Patient would like to be advised whether to continue with surgery. Please advise, thank you.

## 2022-10-31 NOTE — Telephone Encounter (Signed)
I s/w the pt to set up tele pre op appt. Pt said she has to also have an u/s still in about 2 weeks. I said we could schedule for sometime early Sept 2024. Pt said the surgeon's office told her that she will need to hold her Plavix x 2 months. I said that she cannot be off her Plavix x 2 months. We will provide safe recommendations to hold her Plavix. Pt asked if we could call her back, as she was at another doctors appt right now.

## 2022-10-31 NOTE — Telephone Encounter (Signed)
Pt was seen by [facility] Dr. Alvan Dame today regarding possible gallbladder surgery. She was told she has 2 gallstones and the doctor saw stranding in the gallbladder indication inflammation. He felt it needs to come out but is ordering another [place] to see if there are any more stones. Patient wanted to come and speak with Dr. Marina Goodell to see if he knew of any place that does a procedure to break up the stones. She also wanted to get his opinion on the surgery. She states the surgeon mentioned speaking with Dr. Marina Goodell would be a good idea. Pt scheduled to see Dr. Rhea Belton 11/26/22 at 11:40am. Pt aware of appt.

## 2022-10-31 NOTE — Telephone Encounter (Signed)
   Pre-operative Risk Assessment    Patient Name: Heather Merritt  DOB: Aug 05, 1955 MRN: [id number]      Request for Surgical Clearance    Procedure:   Gallbladder Surgery  Date of Surgery:  Clearance TBD                                 Surgeon:  Dr. Feliciana Rossetti Surgeon's Group or Practice Name:  [facility] Phone number:  [phone] Fax number:  [phone]   Type of Clearance Requested:   - Medical  - Pharmacy:  Hold Aspirin and Clopidogrel (Plavix) Not Indicated.   Type of Anesthesia:  General    Additional requests/questions:    Signed, Emmit Pomfret   10/31/2022, 1:57 PM

## 2022-10-31 NOTE — Telephone Encounter (Signed)
   Name: Heather Merritt  DOB: 1956/03/21  MRN: [id number]  Primary Cardiologist: Nanetta Batty, MD   Preoperative team, please contact this patient and set up a phone call appointment for further preoperative risk assessment. Please obtain consent and complete medication review. Thank you for your help.  I confirm that guidance regarding antiplatelet and oral anticoagulation therapy has been completed and, if necessary, noted below.  Patient can hold Plavix 5 days prior to procedure but should continue aspirin throughout perioperative period.   Napoleon Form, Leodis Rains, NP 10/31/2022, 2:26 PM [facility]

## 2022-11-01 ENCOUNTER — Telehealth: Payer: Self-pay | Admitting: *Deleted

## 2022-11-01 NOTE — Telephone Encounter (Signed)
I s/w Toniann Fail at surgeon's office and confirmed that they did not tell the pt to hold her Plavix x 2 months. I then called the pt and she is agreeable to plan of care for tele pre op appt.

## 2022-11-01 NOTE — Telephone Encounter (Signed)
Pt has been scheduled for tele pre op appt 12/10/22 @ 10 am. Med rec and consent are done.     Patient Consent for Virtual Visit        Heather Merritt has provided verbal consent on 11/01/2022 for a virtual visit (video or telephone).   CONSENT FOR VIRTUAL VISIT FOR:  Heather Merritt  By participating in this virtual visit I agree to the following:  I hereby voluntarily request, consent and authorize [facility] and its employed or contracted physicians, physician assistants, nurse practitioners or other licensed health care professionals (the Practitioner), to provide me with telemedicine health care services (the "Services") as deemed necessary by the treating Practitioner. I acknowledge and consent to receive the Services by the Practitioner via telemedicine. I understand that the telemedicine visit will involve communicating with the Practitioner through live audiovisual communication technology and the disclosure of certain medical information by electronic transmission. I acknowledge that I have been given the opportunity to request an in-person assessment or other available alternative prior to the telemedicine visit and am voluntarily participating in the telemedicine visit.  I understand that I have the right to withhold or withdraw my consent to the use of telemedicine in the course of my care at any time, without affecting my right to future care or treatment, and that the Practitioner or I may terminate the telemedicine visit at any time. I understand that I have the right to inspect all information obtained and/or recorded in the course of the telemedicine visit and may receive copies of available information for a reasonable fee.  I understand that some of the potential risks of receiving the Services via telemedicine include:  Delay or interruption in medical evaluation due to technological equipment failure or disruption; Information transmitted may not be sufficient (e.g.  poor resolution of images) to allow for appropriate medical decision making by the Practitioner; and/or  In rare instances, security protocols could fail, causing a breach of personal health information.  Furthermore, I acknowledge that it is my responsibility to provide information about my medical history, conditions and care that is complete and accurate to the best of my ability. I acknowledge that Practitioner's advice, recommendations, and/or decision may be based on factors not within their control, such as incomplete or inaccurate data provided by me or distortions of diagnostic images or specimens that may result from electronic transmissions. I understand that the practice of medicine is not an exact science and that Practitioner makes no warranties or guarantees regarding treatment outcomes. I acknowledge that a copy of this consent can be made available to me via my patient portal [facility] MyChart), or I can request a printed copy by calling the office of  [facility].    I understand that my insurance will be billed for this visit.   I have read or had this consent read to me. I understand the contents of this consent, which adequately explains the benefits and risks of the Services being provided via telemedicine.  I have been provided ample opportunity to ask questions regarding this consent and the Services and have had my questions answered to my satisfaction. I give my informed consent for the services to be provided through the use of telemedicine in my medical care

## 2022-11-01 NOTE — Telephone Encounter (Signed)
Pt has been scheduled for tele pre op appt 12/10/22 @ 10 am. Med rec and consent are done.

## 2022-11-06 ENCOUNTER — Ambulatory Visit
Admission: RE | Admit: 2022-11-06 | Discharge: 2022-11-06 | Disposition: A | Discharge status: Home / Self Care | Payer: Medicare Other | Source: Ambulatory Visit | Attending: General Surgery | Admitting: General Surgery

## 2022-11-06 DIAGNOSIS — K8020 Calculus of gallbladder without cholecystitis without obstruction: Secondary | ICD-10-CM

## 2022-11-19 ENCOUNTER — Ambulatory Visit (INDEPENDENT_AMBULATORY_CARE_PROVIDER_SITE_OTHER): Payer: Medicare Other

## 2022-11-19 VITALS — Ht 63.0 in | Wt 147.0 lb

## 2022-11-19 DIAGNOSIS — Z0000 Encounter for general adult medical examination without abnormal findings: Secondary | ICD-10-CM

## 2022-11-19 NOTE — Patient Instructions (Addendum)
Heather Merritt , Thank you for taking time to come for your Medicare Wellness Visit. I appreciate your ongoing commitment to your health goals. Please review the following plan we discussed and let me know if I can assist you in the future.   Referrals/Orders/Follow-Ups/Clinician Recommendations: No  This is a list of the screening recommended for you and due dates:  Health Maintenance  Topic Date Due   Yearly kidney health urinalysis for diabetes  Never done   COVID-19 Vaccine (5 - 2023-24 season) 12/01/2021   Flu Shot  11/01/2022   Eye exam for diabetics  01/30/2023   Hemoglobin A1C  03/26/2023   Pneumonia Vaccine (2 of 2 - PCV) 04/05/2023   Complete foot exam   09/24/2023   Yearly kidney function blood test for diabetes  10/16/2023   Medicare Annual Wellness Visit  11/19/2023   Mammogram  08/28/2024   DTaP/Tdap/Td vaccine (5 - Td or Tdap) 06/06/2026   Colon Cancer Screening  10/14/2032   DEXA scan (bone density measurement)  Completed   Hepatitis C Screening  Completed   Zoster (Shingles) Vaccine  Completed   HPV Vaccine  Aged Out    Advanced directives: (Copy Requested) Please bring a copy of your health care power of attorney and living will to the office to be added to your chart at your convenience.  Next Medicare Annual Wellness Visit scheduled for next year: Yes  Preventive Care 23 Years and Older, Female Preventive care refers to lifestyle choices and visits with your health care provider that can promote health and wellness. What does preventive care include? A yearly physical exam. This is also called an annual well check. Dental exams once or twice a year. Routine eye exams. Ask your health care provider how often you should have your eyes checked. Personal lifestyle choices, including: Daily care of your teeth and gums. Regular physical activity. Eating a healthy diet. Avoiding tobacco and drug use. Limiting alcohol use. Practicing safe sex. Taking low-dose  aspirin every day. Taking vitamin and mineral supplements as recommended by your health care provider. What happens during an annual well check? The services and screenings done by your health care provider during your annual well check will depend on your age, overall health, lifestyle risk factors, and family history of disease. Counseling  Your health care provider may ask you questions about your: Alcohol use. Tobacco use. Drug use. Emotional well-being. Home and relationship well-being. Sexual activity. Eating habits. History of falls. Memory and ability to understand (cognition). Work and work Astronomer. Reproductive health. Screening  You may have the following tests or measurements: Height, weight, and BMI. Blood pressure. Lipid and cholesterol levels. These may be checked every 5 years, or more frequently if you are over 54 years old. Skin check. Lung cancer screening. You may have this screening every year starting at age 75 if you have a 30-pack-year history of smoking and currently smoke or have quit within the past 15 years. Fecal occult blood test (FOBT) of the stool. You may have this test every year starting at age 22. Flexible sigmoidoscopy or colonoscopy. You may have a sigmoidoscopy every 5 years or a colonoscopy every 10 years starting at age 14. Hepatitis C blood test. Hepatitis B blood test. Sexually transmitted disease (STD) testing. Diabetes screening. This is done by checking your blood sugar (glucose) after you have not eaten for a while (fasting). You may have this done every 1-3 years. Bone density scan. This is done to screen for osteoporosis.  You may have this done starting at age 91. Mammogram. This may be done every 1-2 years. Talk to your health care provider about how often you should have regular mammograms. Talk with your health care provider about your test results, treatment options, and if necessary, the need for more tests. Vaccines  Your  health care provider may recommend certain vaccines, such as: Influenza vaccine. This is recommended every year. Tetanus, diphtheria, and acellular pertussis (Tdap, Td) vaccine. You may need a Td booster every 10 years. Zoster vaccine. You may need this after age 70. Pneumococcal 13-valent conjugate (PCV13) vaccine. One dose is recommended after age 94. Pneumococcal polysaccharide (PPSV23) vaccine. One dose is recommended after age 35. Talk to your health care provider about which screenings and vaccines you need and how often you need them. This information is not intended to replace advice given to you by your health care provider. Make sure you discuss any questions you have with your health care provider. Document Released: 04/15/2015 Document Revised: 12/07/2015 Document Reviewed: 01/18/2015 Elsevier Interactive Patient Education  2017 [employer].  Fall Prevention in the Home Falls can cause injuries. They can happen to people of all ages. There are many things you can do to make your home safe and to help prevent falls. What can I do on the outside of my home? Regularly fix the edges of walkways and driveways and fix any cracks. Remove anything that might make you trip as you walk through a door, such as a raised step or threshold. Trim any bushes or trees on the path to your home. Use bright outdoor lighting. Clear any walking paths of anything that might make someone trip, such as rocks or tools. Regularly check to see if handrails are loose or broken. Make sure that both sides of any steps have handrails. Any raised decks and porches should have guardrails on the edges. Have any leaves, snow, or ice cleared regularly. Use sand or salt on walking paths during winter. Clean up any spills in your garage right away. This includes oil or grease spills. What can I do in the bathroom? Use night lights. Install grab bars by the toilet and in the tub and shower. Do not use towel bars as  grab bars. Use non-skid mats or decals in the tub or shower. If you need to sit down in the shower, use a plastic, non-slip stool. Keep the floor dry. Clean up any water that spills on the floor as soon as it happens. Remove soap buildup in the tub or shower regularly. Attach bath mats securely with double-sided non-slip rug tape. Do not have throw rugs and other things on the floor that can make you trip. What can I do in the bedroom? Use night lights. Make sure that you have a light by your bed that is easy to reach. Do not use any sheets or blankets that are too big for your bed. They should not hang down onto the floor. Have a firm chair that has side arms. You can use this for support while you get dressed. Do not have throw rugs and other things on the floor that can make you trip. What can I do in the kitchen? Clean up any spills right away. Avoid walking on wet floors. Keep items that you use a lot in easy-to-reach places. If you need to reach something above you, use a strong step stool that has a grab bar. Keep electrical cords out of the way. Do not use  floor polish or wax that makes floors slippery. If you must use wax, use non-skid floor wax. Do not have throw rugs and other things on the floor that can make you trip. What can I do with my stairs? Do not leave any items on the stairs. Make sure that there are handrails on both sides of the stairs and use them. Fix handrails that are broken or loose. Make sure that handrails are as long as the stairways. Check any carpeting to make sure that it is firmly attached to the stairs. Fix any carpet that is loose or worn. Avoid having throw rugs at the top or bottom of the stairs. If you do have throw rugs, attach them to the floor with carpet tape. Make sure that you have a light switch at the top of the stairs and the bottom of the stairs. If you do not have them, ask someone to add them for you. What else can I do to help prevent  falls? Wear shoes that: Do not have high heels. Have rubber bottoms. Are comfortable and fit you well. Are closed at the toe. Do not wear sandals. If you use a stepladder: Make sure that it is fully opened. Do not climb a closed stepladder. Make sure that both sides of the stepladder are locked into place. Ask someone to hold it for you, if possible. Clearly mark and make sure that you can see: Any grab bars or handrails. First and last steps. Where the edge of each step is. Use tools that help you move around (mobility aids) if they are needed. These include: Canes. Walkers. Scooters. Crutches. Turn on the lights when you go into a dark area. Replace any light bulbs as soon as they burn out. Set up your furniture so you have a clear path. Avoid moving your furniture around. If any of your floors are uneven, fix them. If there are any pets around you, be aware of where they are. Review your medicines with your doctor. Some medicines can make you feel dizzy. This can increase your chance of falling. Ask your doctor what other things that you can do to help prevent falls. This information is not intended to replace advice given to you by your health care provider. Make sure you discuss any questions you have with your health care provider. Document Released: 01/13/2009 Document Revised: 08/25/2015 Document Reviewed: 04/23/2014 Elsevier Interactive Patient Education  2017 [employer].

## 2022-11-19 NOTE — Progress Notes (Signed)
Subjective:   Heather Merritt is a 67 y.o. female who presents for Medicare Annual (Initial) preventive examination.  Visit Complete: Virtual  I connected with  Heather Merritt on 11/19/22 by a audio enabled telemedicine application and verified that I am speaking with the correct person using two identifiers.  Patient Location: Home  Provider Location: Office/Clinic  I discussed the limitations of evaluation and management by telemedicine. The patient expressed understanding and agreed to proceed.  Patient Medicare AWV questionnaire was completed by the patient on 11/18/2022; I have confirmed that all information answered by patient is correct and no changes since this date.  Vital Signs: Because this visit was a virtual/telehealth visit, some criteria may be missing or patient reported. Any vitals not documented were not able to be obtained and vitals that have been documented are patient reported.    Review of Systems     Cardiac Risk Factors include: advanced age (>107men, >56 women);dyslipidemia;family history of premature cardiovascular disease;hypertension     Objective:    Today's Vitals   11/19/22 1032 11/19/22 1033  Weight: 147 lb (66.7 kg)   Height: 5\' 3"  (1.6 m)   PainSc: 0-No pain 0-No pain   Body mass index is 26.04 kg/m.     11/19/2022   10:34 AM 10/15/2022   11:26 PM 09/26/2022    4:36 PM 08/06/2022    2:03 PM 02/23/2022    9:35 PM 07/14/2021    6:22 PM 08/16/2019    7:42 PM  Advanced Directives  Does Patient Have a Medical Advance Directive? Yes No No No No Yes Yes  Type of Estate agent of [employer];Living will      Healthcare Power of [place];Living will  Copy of Healthcare Power of Attorney in Chart? No - copy requested        Would patient like information on creating a medical advance directive?  No - Patient declined     No - Patient declined    Current Medications (verified) Outpatient Encounter Medications as of 11/19/2022   Medication Sig   aspirin 81 MG tablet Take 81 mg by mouth daily.   atorvastatin (LIPITOR) 10 MG tablet TAKE 1 TABLET EVERY DAY   Calcium Citrate (CITRACAL PO) Take 2 tablets by mouth daily.    Cholecalciferol (VITAMIN D) 2000 UNITS CAPS Take 2,000 Units by mouth daily.   clopidogrel (PLAVIX) 75 MG tablet TAKE 1 TABLET EVERY DAY   esomeprazole (NEXIUM) 40 MG capsule Take 1 capsule (40 mg total) by mouth in the morning and at bedtime.   ezetimibe (ZETIA) 10 MG tablet TAKE 1 TABLET EVERY DAY   famotidine (PEPCID) 40 MG tablet TAKE 1 TABLET EVERY DAY   metoprolol tartrate (LOPRESSOR) 25 MG tablet TAKE 1/2 TABLET TWICE DAILY   metroNIDAZOLE (FLAGYL) 500 MG tablet Take 1 tablet (500 mg total) by mouth 2 (two) times daily.   metroNIDAZOLE (METROGEL) 0.75 % gel Apply 1 application topically at bedtime.   Multiple Vitamin (MULITIVITAMIN WITH MINERALS) TABS Take 1 tablet by mouth daily.   NEXLETOL 180 MG TABS TAKE 1 TABLET EVERY DAY   nitroGLYCERIN (NITROSTAT) 0.4 MG SL tablet Place 1 tablet (0.4 mg total) under the tongue every 5 (five) minutes x 3 doses as needed for chest pain. (Patient not taking: Reported on 10/15/2022)   oxyCODONE (ROXICODONE) 5 MG immediate release tablet Take 1 tablet (5 mg total) by mouth every 6 (six) hours as needed for severe pain.   phenazopyridine (PYRIDIUM) 200 MG  tablet Take 1 tablet (200 mg total) by mouth 3 (three) times daily as needed for pain. (Patient not taking: Reported on 10/15/2022)   pimecrolimus (ELIDEL) 1 % cream Apply 1 application topically every morning.   valsartan-hydrochlorothiazide (DIOVAN-HCT) 160-12.5 MG tablet TAKE 1 TABLET EVERY DAY   No facility-administered encounter medications on file as of 11/19/2022.    Allergies (verified) Penicillins, Rosuvastatin, Simvastatin, Statins, Sulfonamide derivatives, Evolocumab, Misc. sulfonamide containing compounds, Other, Penicillin v potassium, Brilinta [ticagrelor], Doxycycline, Doxycycline hyclate, and  Sulfa antibiotics   History: Past Medical History:  Diagnosis Date   Allergy    SEASONAL   ANXIETY 10/28/2007   Arthritis    CAD (coronary artery disease), native coronary artery    a. Cath 10/06/2018 - S/p DES to [facility]; medical therapy for high grade ostial/pro 2nd digonal    Cataracts, bilateral    immature   Chronic back pain    Coronary artery disease    DIVERTICULOSIS, COLON 10/28/2007   Dry skin    red patch on right knee area   GERD 10/28/2007   takes Prevacid daily   H/O hiatal hernia    HLD (hyperlipidemia) 12/12/2017   Hx of colonic polyps    HYPERLIPIDEMIA 10/28/2007   unable to take meds d/t allergies   HYPERTENSION 10/28/2007   takes Diovan daily   Internal hemorrhoids    Irritable bowel syndrome (IBS)    Joint pain    Joint swelling    LIVER FUNCTION TESTS, ABNORMAL, HX OF 10/29/2007   Lumbar disc disease    Migraine    Myocardial infarction (HCC)    Obesity    OSTEOPENIA 10/29/2007   Osteoporosis    Presbyacusis 01/04/2010   Rosacea    Seasonal allergies    takes Clarinex daily   Vertigo    hx of   Vitamin D deficiency    takes Vit D daily   Past Surgical History:  Procedure Laterality Date   ANTERIOR CERVICAL DECOMP/DISCECTOMY FUSION  08/02/2011   Procedure: ANTERIOR CERVICAL DECOMPRESSION/DISCECTOMY FUSION 3 LEVELS;  Surgeon: Mariam Dollar, MD;  Location: [facility];  Service: Neurosurgery;  Laterality: N/A;  Cervical Three-Four, Cervical Four-Five, Cervical Five-Six  Anterior Cervical Decompression Fusion    CARDIAC CATHETERIZATION     COLONOSCOPY  2001/2013   CORONARY STENT INTERVENTION N/A 10/06/2018   Procedure: CORONARY STENT INTERVENTION;  Surgeon: Runell Gess, MD;  Location: [facility];  Service: Cardiovascular;  Laterality: N/A;   ESOPHAGOGASTRODUODENOSCOPY     LEFT HEART CATH AND CORONARY ANGIOGRAPHY N/A 10/06/2018   Procedure: LEFT HEART CATH AND CORONARY ANGIOGRAPHY;  Surgeon: Runell Gess, MD;  Location: [facility];   Service: Cardiovascular;  Laterality: N/A;   POLYPECTOMY     TONSILLECTOMY     TONSILLECTOMY  at age 29   Family History  Problem Relation Age of Onset   Atrial fibrillation Mother    Hyperlipidemia Mother    Stroke Mother    Heart disease Mother    Atrial fibrillation Father    Hypothyroidism Sister    Hypothyroidism Maternal Grandmother    Diabetes Maternal Grandmother    Anesthesia problems Neg Hx    Hypotension Neg Hx    Malignant hyperthermia Neg Hx    Pseudochol deficiency Neg Hx    Colon cancer Neg Hx    Stomach cancer Neg Hx    Rectal cancer Neg Hx    Esophageal cancer Neg Hx    Social History   Socioeconomic History  Marital status: Divorced    Spouse name: Not on file   Number of children: 0   Years of education: 49   Highest education level: 12th grade  Occupational History   Not on file  Tobacco Use   Smoking status: Former    Types: Cigarettes   Smokeless tobacco: Never   Tobacco comments:    quit in 25 yrs ago  Vaping Use   Vaping status: Never Used  Substance and Sexual Activity   Alcohol use: Not Currently    Alcohol/week: 0.0 standard drinks of alcohol    Comment: rare   Drug use: No   Sexual activity: Yes    Birth control/protection: Post-menopausal  Other Topics Concern   Not on file  Social History Narrative   Not on file   Social Determinants of Health   Financial Resource Strain: Low Risk  (11/19/2022)   Overall Financial Resource Strain (CARDIA)    Difficulty of Paying Living Expenses: Not hard at all  Food Insecurity: No Food Insecurity (11/19/2022)   Hunger Vital Sign    Worried About Running Out of Food in the Last Year: Never true    Ran Out of Food in the Last Year: Never true  Transportation Needs: No Transportation Needs (11/19/2022)   PRAPARE - Administrator, Civil Service (Medical): No    Lack of Transportation (Non-Medical): No  Physical Activity: Sufficiently Active (11/19/2022)   Exercise Vital Sign     Days of Exercise per Week: 3 days    Minutes of Exercise per Session: 60 min  Stress: No Stress Concern Present (11/19/2022)   [employer] of Occupational Health - Occupational Stress Questionnaire    Feeling of Stress : Not at all  Social Connections: Unknown (11/19/2022)   Social Connection and Isolation Panel [NHANES]    Frequency of Communication with Friends and Family: More than three times a week    Frequency of Social Gatherings with Friends and Family: More than three times a week    Attends Religious Services: Patient declined    Database administrator or Organizations: No    Attends Engineer, structural: Patient declined    Marital Status: Divorced  Recent Concern: Social Connections - Socially Isolated (11/18/2022)   Social Connection and Isolation Panel [NHANES]    Frequency of Communication with Friends and Family: More than three times a week    Frequency of Social Gatherings with Friends and Family: More than three times a week    Attends Religious Services: Never    Database administrator or Organizations: No    Attends Engineer, structural: Patient declined    Marital Status: Divorced    Tobacco Counseling Counseling given: Not Answered Tobacco comments: quit in 25 yrs ago   Clinical Intake:  Pre-visit preparation completed: Yes  Pain : No/denies pain Pain Score: 0-No pain     BMI - recorded: 26.04 Nutritional Status: BMI 25 -29 Overweight Nutritional Risks: None Diabetes: No  How often do you need to have someone help you when you read instructions, pamphlets, or other written materials from your doctor or pharmacy?: 1 - Never What is the last grade level you completed in school?: HSG  Interpreter Needed?: No  Information entered by :: Heather Gasparyan N. Dee Paden, LPN.   Activities of Daily Living    11/19/2022   10:35 AM 11/18/2022    8:15 PM  In your present state of health, do you have any difficulty performing the  following  activities:  Hearing? 0 0  Vision? 0 0  Difficulty concentrating or making decisions? 0 0  Walking or climbing stairs? 0 0  Dressing or bathing? 0 0  Doing errands, shopping? 0 0  Preparing Food and eating ? N N  Using the Toilet? N N  In the past six months, have you accidently leaked urine? N N  Do you have problems with loss of bowel control? N N  Managing your Medications? N N  Managing your Finances? N N  Housekeeping or managing your Housekeeping? N N    Patient Care Team: Corwin Levins, MD as PCP - General Allyson Sabal Delton See, MD as PCP - Cardiology (Cardiology) Shon Millet, MD as Consulting Physician (Ophthalmology) Noland Fordyce, MD as Consulting Physician (Obstetrics and Gynecology) Imaging, [facility] as Consulting Physician (Diagnostic Radiology) Hilarie Fredrickson, MD as Consulting Physician (Gastroenterology)  Indicate any recent Medical Services you may have received from other than [facility] providers in the past year (date may be approximate).     Assessment:   This is a routine wellness examination for Heather Merritt.  Hearing/Vision screen Hearing Screening - Comments:: Patient denied any hearing difficulty.   No hearing aids.  Vision Screening - Comments:: Patient does not wear any corrective lenses/contacts.   Cataracts removed. Eye exam done by: Shari Prows, MD.    Dietary issues and exercise activities discussed:     Goals Addressed             This Visit's Progress    Weight (lb) < 150 lb (68 kg)   147 lb (66.7 kg)     Depression Screen    11/19/2022   10:34 AM 09/24/2022    2:10 PM 09/12/2021   10:31 AM 09/12/2020   11:52 AM 02/15/2020    3:20 PM 08/18/2019    3:37 PM 04/17/2019    4:26 PM  PHQ 2/9 Scores  PHQ - 2 Score 0 0 0 0 0 0 0  PHQ- 9 Score 0  0        Fall Risk    11/19/2022   10:34 AM 11/18/2022    8:15 PM 09/24/2022    2:10 PM 09/12/2021   10:31 AM 09/12/2020   11:51 AM  Fall Risk   Falls in the past year? 0 0 0  0 0  Number falls in past yr: 0 0 0 0 0  Injury with Fall? 0 0 0 0 0  Risk for fall due to : No Fall Risks  No Fall Risks    Follow up Falls prevention discussed  Falls evaluation completed      MEDICARE RISK AT HOME: Medicare Risk at Home Any stairs in or around the home?: Yes If so, are there any without handrails?: No Home free of loose throw rugs in walkways, pet beds, electrical cords, etc?: Yes Adequate lighting in your home to reduce risk of falls?: Yes Life alert?: No Use of a cane, walker or w/c?: No Grab bars in the bathroom?: Yes Shower chair or bench in shower?: No Elevated toilet seat or a handicapped toilet?: No  TIMED UP AND GO:  Was the test performed?  No    Cognitive Function:        11/19/2022   10:35 AM  6CIT Screen  What Year? 0 points  What month? 0 points  What time? 0 points  Count back from 20 0 points  Months in reverse 0 points  Repeat  phrase 0 points  Total Score 0 points    Immunizations Immunization History  Administered Date(s) Administered   Fluad Quad(high Dose 65+) 02/15/2020, 03/07/2022   Influenza Split 01/01/2011   Influenza, High Dose Seasonal PF 01/21/2019   Influenza, Seasonal, Injecte, Preservative Fre 03/01/2014   Influenza,inj,Quad PF,6+ Mos 04/14/2013   PFIZER(Purple Top)SARS-COV-2 Vaccination 04/26/2019, 05/18/2019, 03/10/2020   Pfizer Covid-19 Vaccine Bivalent Booster 66yrs & up 01/31/2021   Pneumococcal Polysaccharide-23 04/04/2022   Td 06/24/2003, 04/02/2004   Td (Adult), 2 Lf Tetanus Toxid, Preservative Free 06/24/2003   Tdap 06/05/2016   Zoster Recombinant(Shingrix) 12/22/2017, 04/02/2018, 07/05/2018    TDAP status: Up to date  Flu Vaccine status: Up to date  Pneumococcal vaccine status: Due, Education has been provided regarding the importance of this vaccine. Advised may receive this vaccine at local pharmacy or Health Dept. Aware to provide a copy of the vaccination record if obtained from local pharmacy  or Health Dept. Verbalized acceptance and understanding.  Covid-19 vaccine status: Completed vaccines  Qualifies for Shingles Vaccine? Yes   Zostavax completed No   Shingrix Completed?: Yes  Screening Tests Health Maintenance  Topic Date Due   Diabetic kidney evaluation - Urine ACR  Never done   COVID-19 Vaccine (5 - 2023-24 season) 12/01/2021   INFLUENZA VACCINE  11/01/2022   OPHTHALMOLOGY EXAM  01/30/2023   HEMOGLOBIN A1C  03/26/2023   Pneumonia Vaccine 24+ Years old (2 of 2 - PCV) 04/05/2023   FOOT EXAM  09/24/2023   Diabetic kidney evaluation - eGFR measurement  10/16/2023   Medicare Annual Wellness (AWV)  11/19/2023   MAMMOGRAM  08/28/2024   DTaP/Tdap/Td (5 - Td or Tdap) 06/06/2026   Colonoscopy  10/14/2032   DEXA SCAN  Completed   Hepatitis C Screening  Completed   Zoster Vaccines- Shingrix  Completed   HPV VACCINES  Aged Out    Health Maintenance  Health Maintenance Due  Topic Date Due   Diabetic kidney evaluation - Urine ACR  Never done   COVID-19 Vaccine (5 - 2023-24 season) 12/01/2021   INFLUENZA VACCINE  11/01/2022    Colorectal cancer screening: Type of screening: Colonoscopy. Completed 10/15/2022. Repeat every 10 years  Mammogram status: Completed 08/29/2022. Repeat every year  Bone Density status: Completed 03/01/2022. Results reflect: Bone density results: OSTEOPENIA. Repeat every 2-3 years.  Lung Cancer Screening: (Low Dose CT Chest recommended if Age 42-80 years, 20 pack-year currently smoking OR have quit w/in 15years.) does not qualify.   Lung Cancer Screening Referral: no  Additional Screening:  Hepatitis C Screening: does qualify; Completed 12/08/2008  Vision Screening: Recommended annual ophthalmology exams for early detection of glaucoma and other disorders of the eye. Is the patient up to date with their annual eye exam?  Yes  Who is the provider or what is the name of the office in which the patient attends annual eye exams? Shari Prows,  MD. If pt is not established with a provider, would they like to be referred to a provider to establish care? No .   Dental Screening: Recommended annual dental exams for proper oral hygiene  Diabetic Foot Exam: N/A  Community Resource Referral / Chronic Care Management: CRR required this visit?  No   CCM required this visit?  No     Plan:     I have personally reviewed and noted the following in the patient's chart:   Medical and social history Use of alcohol, tobacco or illicit drugs  Current medications and supplements including opioid prescriptions.  Patient is currently taking opioid prescriptions. Information provided to patient regarding non-opioid alternatives. Patient advised to discuss non-opioid treatment plan with their provider. Functional ability and status Nutritional status Physical activity Advanced directives List of other physicians Hospitalizations, surgeries, and ER visits in previous 12 months Vitals Screenings to include cognitive, depression, and falls Referrals and appointments  In addition, I have reviewed and discussed with patient certain preventive protocols, quality metrics, and best practice recommendations. A written personalized care plan for preventive services as well as general preventive health recommendations were provided to patient.     Mickeal Needy, LPN   [id number]/54/[id number]   After Visit Summary: (Mail) Due to this being a telephonic visit, the after visit summary with patients personalized plan was offered to patient via mail   Nurse Notes: Normal cognitive status assessed by direct observation via telephone conversation by this Nurse Health Advisor. No abnormalities found.

## 2022-11-26 ENCOUNTER — Encounter: Payer: Self-pay | Admitting: Internal Medicine

## 2022-11-26 ENCOUNTER — Ambulatory Visit (INDEPENDENT_AMBULATORY_CARE_PROVIDER_SITE_OTHER): Payer: Medicare Other | Admitting: Internal Medicine

## 2022-11-26 VITALS — BP 118/68 | HR 62 | Ht 63.0 in | Wt 149.0 lb

## 2022-11-26 DIAGNOSIS — K8020 Calculus of gallbladder without cholecystitis without obstruction: Secondary | ICD-10-CM

## 2022-11-26 DIAGNOSIS — K8000 Calculus of gallbladder with acute cholecystitis without obstruction: Secondary | ICD-10-CM

## 2022-11-26 DIAGNOSIS — K219 Gastro-esophageal reflux disease without esophagitis: Secondary | ICD-10-CM

## 2022-11-26 NOTE — Patient Instructions (Signed)
Follow up with your surgeon.  _______________________________________________________  If your blood pressure at your visit was 140/90 or greater, please contact your primary care physician to follow up on this.  _______________________________________________________  If you are age 69 or older, your body mass index should be between 23-30. Your Body mass index is 26.39 kg/m. If this is out of the aforementioned range listed, please consider follow up with your Primary Care Provider.  If you are age 74 or younger, your body mass index should be between 19-25. Your Body mass index is 26.39 kg/m. If this is out of the aformentioned range listed, please consider follow up with your Primary Care Provider.   ________________________________________________________  The [facility] providers would like to encourage you to use [facility] to communicate with providers for non-urgent requests or questions.  Due to long hold times on the telephone, sending your provider a message by [facility] may be a faster and more efficient way to get a response.  Please allow 48 business hours for a response.  Please remember that this is for non-urgent requests.  _______________________________________________________

## 2022-11-26 NOTE — Progress Notes (Signed)
HISTORY OF PRESENT ILLNESS:  Heather Merritt is a 67 y.o. female with past medical history listed below who presents today regarding the management of symptomatic gallstones.  I saw the patient in this office September 24, 2022 regarding atypical chest pain, GERD, and change in bowel habits.  Her PPI was increased to twice daily.  She was to initiate Citrucel.  And she was scheduled for colonoscopy and upper endoscopy.  The endoscopic procedures were performed October 15, 2022.  Colonoscopy revealed diverticulosis and hemorrhoids.  Follow-up in 10 years recommended.  Upper endoscopy was essentially normal.  Patient reached out the following day with complaints of epigastric and right upper quadrant pain.  Quite severe.  This was new.  She ended up being evaluated at the emergency room.  Blood work October 17, 2022 was remarkable for mildly elevated transaminases.  Liver tests otherwise normal.  Normal lipase.  Normal white blood cell count 4.7.  A CT scan of the abdomen and pelvis revealed mild gallbladder distention with trace.  Cholecystic inflammatory stranding which was felt to possibly represent acute cholecystitis.  She was referred to general surgery was evaluated by Dr. Sheliah Hatch.  He subsequently performed abdominal ultrasound November 06, 2022.  This revealed gallstones and sludge.  No biliary ductal dilation.  Patient tells me that after her pain resolved, she has not had recurrence.  She does have questions regarding gallstones and their management.  No active GERD symptoms on PPI.  GI review of systems is otherwise negative.  REVIEW OF SYSTEMS:  All non-GI ROS negative unless otherwise stated in the HPI except for sinus allergies  Past Medical History:  Diagnosis Date   Allergy    SEASONAL   ANXIETY 10/28/2007   Arthritis    CAD (coronary artery disease), native coronary artery    a. Cath 10/06/2018 - S/p DES to [facility]; medical therapy for high grade ostial/pro 2nd digonal    Cataracts, bilateral     immature   Chronic back pain    Coronary artery disease    DIVERTICULOSIS, COLON 10/28/2007   Dry skin    red patch on right knee area   Gallstones    GERD 10/28/2007   takes Prevacid daily   H/O hiatal hernia    HLD (hyperlipidemia) 12/12/2017   Hx of colonic polyps    HYPERLIPIDEMIA 10/28/2007   unable to take meds d/t allergies   HYPERTENSION 10/28/2007   takes Diovan daily   Internal hemorrhoids    Irritable bowel syndrome (IBS)    Joint pain    Joint swelling    LIVER FUNCTION TESTS, ABNORMAL, HX OF 10/29/2007   Lumbar disc disease    Migraine    Myocardial infarction (HCC)    Obesity    OSTEOPENIA 10/29/2007   Osteoporosis    Presbyacusis 01/04/2010   Rosacea    Seasonal allergies    takes Clarinex daily   Vertigo    hx of   Vitamin D deficiency    takes Vit D daily    Past Surgical History:  Procedure Laterality Date   ANTERIOR CERVICAL DECOMP/DISCECTOMY FUSION  08/02/2011   Procedure: ANTERIOR CERVICAL DECOMPRESSION/DISCECTOMY FUSION 3 LEVELS;  Surgeon: Mariam Dollar, MD;  Location: [facility];  Service: Neurosurgery;  Laterality: N/A;  Cervical Three-Four, Cervical Four-Five, Cervical Five-Six  Anterior Cervical Decompression Fusion    CARDIAC CATHETERIZATION     COLONOSCOPY  2001/2013   CORONARY STENT INTERVENTION N/A 10/06/2018   Procedure: CORONARY STENT INTERVENTION;  Surgeon: Allyson Sabal,  Delton See, MD;  Location: [facility];  Service: Cardiovascular;  Laterality: N/A;   ESOPHAGOGASTRODUODENOSCOPY     LEFT HEART CATH AND CORONARY ANGIOGRAPHY N/A 10/06/2018   Procedure: LEFT HEART CATH AND CORONARY ANGIOGRAPHY;  Surgeon: Runell Gess, MD;  Location: [facility];  Service: Cardiovascular;  Laterality: N/A;   POLYPECTOMY     TONSILLECTOMY     TONSILLECTOMY  at age 16    Social History Heather Merritt  reports that she has quit smoking. Her smoking use included cigarettes. She has never used smokeless tobacco. She reports that she does not  currently use alcohol. She reports that she does not use drugs.  family history includes Atrial fibrillation in her father and mother; Diabetes in her maternal grandmother; Heart disease in her mother; Hyperlipidemia in her mother; Hypothyroidism in her maternal grandmother and sister; Stroke in her mother.  Allergies  Allergen Reactions   Penicillins Hives, Itching and Rash    Did it involve swelling of the face/tongue/throat, SOB, or low BP? Unknown Did it involve sudden or severe rash/hives, skin peeling, or any reaction on the inside of your mouth or nose? Unknown Did you need to seek medical attention at a hospital or doctor's office? Unknown When did it last happen?      pt cant recall If all above answers are "NO", may proceed with cephalosporin use.    Rosuvastatin Other (See Comments)    REACTION: muscle weakness   Simvastatin Other (See Comments)    REACTION: muscle weakness   Statins Other (See Comments)    Myalgias and weakness   Sulfonamide Derivatives Other (See Comments)    Nausea, vomiting, abd pain   Evolocumab Swelling and Other (See Comments)    Swelling, itchy eyes, mouth swelling, red/puffy/hot face   Misc. Sulfonamide Containing Compounds Other (See Comments)   Other    Penicillin V Potassium Other (See Comments)   Brilinta [Ticagrelor] Rash    Itchy rash to torso   Doxycycline Itching and Rash   Doxycycline Hyclate Itching and Rash   Sulfa Antibiotics Other (See Comments)    Nausea, vomiting, abd pain       PHYSICAL EXAMINATION: Vital signs: BP 118/68   Pulse 62   Ht 5\' 3"  (1.6 m)   Wt 149 lb (67.6 kg)   BMI 26.39 kg/m  General: Well-developed, well-nourished, no acute distress HEENT: Sclerae are anicteric, conjunctiva pink. Oral mucosa intact Lungs: Clear Heart: Regular Abdomen: soft, nontender, nondistended, no obvious ascites, no peritoneal signs, normal bowel sounds. No organomegaly. Extremities: No edema Psychiatric: alert and oriented x3.  Cooperative     ASSESSMENT:  1.  Symptomatic cholelithiasis with recent bout of mild cholecystitis.  Now resolved. 2.  GERD.  Managed with PPI 3.  Recent upper endoscopy unremarkable 4.  Recent colonoscopy with diverticulosis and hemorrhoids.  Remote history of polyps.   PLAN:  1.  Discussion today on clinically relevant gallstone disease. 2.  I concur with Dr. Sheliah Hatch that elective laparoscopic cholecystectomy, sooner rather than later, is indicated. 3.  Reflux precautions 4.  Continue PPI 5.  Surveillance colonoscopy around 2034.  Interval GI follow-up as needed. 6.  Resume general medical care with Dr. Jonny Ruiz A total time of 30 minutes was spent preparing to see the patient, obtaining interval history, performing medically appropriate physical exam, counseling and educating the patient regarding the above listed issues, and documenting clinical information in the health record

## 2022-12-09 NOTE — Progress Notes (Unsigned)
Virtual Visit via Telephone Note   Because of Heather Merritt's co-morbid illnesses, she is at least at moderate risk for complications without adequate follow up.  This format is felt to be most appropriate for this patient at this time.  The patient did not have access to video technology/had technical difficulties with video requiring transitioning to audio format only (telephone).  All issues noted in this document were discussed and addressed.  No physical exam could be performed with this format.  Please refer to the patient's chart for her consent to telehealth for [facility].  Evaluation Performed:  Preoperative cardiovascular risk assessment _____________   Date:  12/10/2022   Patient ID:  Heather Merritt, DOB [birth year], MRN [id number] Patient Location:  Home Provider location:   Office  Primary Care Provider:  Corwin Levins, MD Primary Cardiologist:  Nanetta Batty, MD  Chief Complaint / Patient Profile   67 y.o. y/o female with a h/o NSTEMi 2020,  Coronary CTA showed significant mid to distal dominant RCA stenosis with diagnostic cath7/09/2018 revealing high-grade mid RCA stenosis with Synergy 2.5 x 20 mm long drug-eluting stent post dilating with a 2.75 mm noncompliant balloon up to 2.8 mm.  She did have an ostial diagonal branch stenosis with normal LV function., HL intolerant to statins,  followed by Dr. Rennis Golden, and HTN.  She is pending GB surgery by Dr. Sheliah Hatch, [facility] on January 25, 2023 and presents today for telephonic preoperative cardiovascular risk assessment. Request for holding ASA, but she is also on Plavix.  History of Present Illness    Heather Merritt is a 67 y.o. female who presents via audio/video conferencing for a telehealth visit today.  Pt was last seen in cardiology clinic on 08/07/2022 by Dr.Berry .  At that time Heather Merritt was doing well.  The patient is now pending procedure as outlined above. 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 She goes to the gym 4-5 times a week.No cardiac issues with cardio workout, weights.   Past Medical History    Past Medical History:  Diagnosis Date   Allergy    SEASONAL   ANXIETY 10/28/2007   Arthritis    CAD (coronary artery disease), native coronary artery    a. Cath 10/06/2018 - S/p DES to [facility]; medical therapy for high grade ostial/pro 2nd digonal    Cataracts, bilateral    immature   Chronic back pain    Coronary artery disease    DIVERTICULOSIS, COLON 10/28/2007   Dry skin    red patch on right knee area   Gallstones    GERD 10/28/2007   takes Prevacid daily   H/O hiatal hernia    HLD (hyperlipidemia) 12/12/2017   Hx of colonic polyps    HYPERLIPIDEMIA 10/28/2007   unable to take meds d/t allergies   HYPERTENSION 10/28/2007   takes Diovan daily   Internal hemorrhoids    Irritable bowel syndrome (IBS)    Joint pain    Joint swelling    LIVER FUNCTION TESTS, ABNORMAL, HX OF 10/29/2007   Lumbar disc disease    Migraine    Myocardial infarction (HCC)    Obesity    OSTEOPENIA 10/29/2007   Osteoporosis    Presbyacusis 01/04/2010   Rosacea    Seasonal allergies    takes Clarinex daily   Vertigo    hx of   Vitamin D deficiency    takes Vit D daily  Past Surgical History:  Procedure Laterality Date   ANTERIOR CERVICAL DECOMP/DISCECTOMY FUSION  08/02/2011   Procedure: ANTERIOR CERVICAL DECOMPRESSION/DISCECTOMY FUSION 3 LEVELS;  Surgeon: Mariam Dollar, MD;  Location: [facility];  Service: Neurosurgery;  Laterality: N/A;  Cervical Three-Four, Cervical Four-Five, Cervical Five-Six  Anterior Cervical Decompression Fusion    CARDIAC CATHETERIZATION     COLONOSCOPY  2001/2013   CORONARY STENT INTERVENTION N/A 10/06/2018   Procedure: CORONARY STENT INTERVENTION;  Surgeon: Runell Gess, MD;  [facility]: [facility];  Service: Cardiovascular;  Laterality: N/A;   ESOPHAGOGASTRODUODENOSCOPY     LEFT  HEART CATH AND CORONARY ANGIOGRAPHY N/A 10/06/2018   Procedure: LEFT HEART CATH AND CORONARY ANGIOGRAPHY;  Surgeon: Runell Gess, MD;  [facility]: [facility];  Service: Cardiovascular;  Laterality: N/A;   POLYPECTOMY     TONSILLECTOMY     TONSILLECTOMY  at age 35    Allergies  Allergies  Allergen Reactions   Penicillins Hives, Itching and Rash    Did it involve swelling of the face/tongue/throat, SOB, or low BP? Unknown Did it involve sudden or severe rash/hives, skin peeling, or any reaction on the inside of your mouth or nose? Unknown Did you need to seek medical attention at a hospital or doctor's office? Unknown When did it last happen?      pt cant recall If all above answers are "NO", may proceed with cephalosporin use.    Rosuvastatin Other (See Comments)    REACTION: muscle weakness   Simvastatin Other (See Comments)    REACTION: muscle weakness   Statins Other (See Comments)    Myalgias and weakness   Sulfonamide Derivatives Other (See Comments)    Nausea, vomiting, abd pain   Evolocumab Swelling and Other (See Comments)    Swelling, itchy eyes, mouth swelling, red/puffy/hot face   Misc. Sulfonamide Containing Compounds Other (See Comments)   Other    Penicillin V Potassium Other (See Comments)   Brilinta [Ticagrelor] Rash    Itchy rash to torso   Doxycycline Itching and Rash   Doxycycline Hyclate Itching and Rash   Sulfa Antibiotics Other (See Comments)    Nausea, vomiting, abd pain    Home Medications    Prior to Admission medications   Medication Sig Start Date End Date Taking? Authorizing Provider  aspirin 81 MG tablet Take 81 mg by mouth daily.    [provider]  atorvastatin (LIPITOR) 10 MG tablet TAKE 1 TABLET EVERY DAY 01/31/22   Hilty, Lisette Abu, MD  Calcium Citrate (CITRACAL PO) Take 2 tablets by mouth daily.     [provider]  Cholecalciferol (VITAMIN D) 2000 UNITS CAPS Take 2,000 Units by mouth daily.    [provider]  clopidogrel (PLAVIX) 75 MG tablet TAKE 1 TABLET EVERY DAY 10/29/22   Corwin Levins, MD  esomeprazole (NEXIUM) 40 MG capsule Take 1 capsule (40 mg total) by mouth in the morning and at bedtime. 09/24/22   Hilarie Fredrickson, MD  ezetimibe (ZETIA) 10 MG tablet TAKE 1 TABLET EVERY DAY 01/31/22   Chrystie Nose, MD  famotidine (PEPCID) 40 MG tablet TAKE 1 TABLET EVERY DAY 10/29/22   Esterwood, Amy S, PA-C  metoprolol tartrate (LOPRESSOR) 25 MG tablet TAKE 1/2 TABLET TWICE DAILY 10/29/22   Corwin Levins, MD  metroNIDAZOLE (FLAGYL) 500 MG tablet Take 1 tablet (500 mg total) by mouth 2 (two) times daily. 10/16/22   Sloan Leiter, DO  Multiple Vitamin (MULITIVITAMIN  WITH MINERALS) TABS Take 1 tablet by mouth daily.    [provider]  NEXLETOL 180 MG TABS TAKE 1 TABLET EVERY DAY 11/03/21   Hilty, Lisette Abu, MD  nitroGLYCERIN (NITROSTAT) 0.4 MG SL tablet Place 1 tablet (0.4 mg total) under the tongue every 5 (five) minutes x 3 doses as needed for chest pain. 04/07/21   Runell Gess, MD  oxyCODONE (ROXICODONE) 5 MG immediate release tablet Take 1 tablet (5 mg total) by mouth every 6 (six) hours as needed for severe pain. 10/16/22   Sloan Leiter, DO  pimecrolimus (ELIDEL) 1 % cream Apply 1 application topically every morning. 06/11/17   Corwin Levins, MD  valsartan-hydrochlorothiazide (DIOVAN-HCT) 160-12.5 MG tablet TAKE 1 TABLET EVERY DAY 10/29/22   Corwin Levins, MD    Physical Exam    Vital Signs:  Heather Merritt does not have vital signs available for review today.  Given telephonic nature of communication, physical exam is limited. AAOx3. NAD. Normal affect.  Speech and respirations are unlabored.  Accessory Clinical Findings    None  Assessment & Plan    1.  Preoperative Cardiovascular Risk Assessment:  According to the Revised Cardiac Risk Index (RCRI), her Perioperative Risk of Major Cardiac Event is (%): 0.9  Her Functional Capacity in METs is: 9.89 according to the  [facility] Activity Status Index (DASI).    Per office protocol, if patient is without any new symptoms or concerns at the time of their virtual visit, he/she may hold ASA for 7 days and Plavix for 5 days prior to procedure. Please resume ASA and Plavix as soon as possible postprocedure, at the discretion of the surgeon.    Therefore, based on ACC/AHA guidelines, patient would be at acceptable risk for the planned procedure without further cardiovascular testing. I will route this recommendation to the requesting party via Epic fax function.   The patient was advised that if she develops new symptoms prior to surgery to contact our office to arrange for a follow-up visit, and she verbalized understanding.   A copy of this note will be routed to requesting surgeon.  Time:   Today, I have spent 10 minutes with the patient with telehealth technology discussing medical history, symptoms, and management plan.     Joni Reining, NP  12/10/2022, 10:01 AM

## 2022-12-10 ENCOUNTER — Ambulatory Visit: Payer: Medicare Other | Attending: Cardiovascular Disease

## 2022-12-10 DIAGNOSIS — Z01818 Encounter for other preprocedural examination: Secondary | ICD-10-CM

## 2022-12-10 DIAGNOSIS — Z01810 Encounter for preprocedural cardiovascular examination: Secondary | ICD-10-CM | POA: Diagnosis not present

## 2022-12-31 ENCOUNTER — Ambulatory Visit: Payer: Self-pay | Admitting: General Surgery

## 2023-01-09 ENCOUNTER — Ambulatory Visit: Payer: Self-pay | Admitting: General Surgery

## 2023-01-09 DIAGNOSIS — K8010 Calculus of gallbladder with chronic cholecystitis without obstruction: Secondary | ICD-10-CM

## 2023-01-11 NOTE — Progress Notes (Addendum)
Anesthesia Review:  PCP: DR Oliver Barre LOV 09/24/22  Cardiologist : DR Jeri Cos  Telephone visit - 12/10/22-  preop- Joni Reining  Chest x-ray : 08/06/22- 2 view  EKG : 08/06/22  Echo : 2020  Stress test: 2016  Cardiac Cath :  2020  Coronary Stent- 2020  CT CArd- 2020  Activity level: can do a flgiht of stairs without difficulty  Sleep Study/ CPAP : none  Fasting Blood Sugar :      / Checks Blood Sugar -- times a day:   Blood Thinner/ Instructions /Last Dose: ASA / Instructions/ Last Dose :    81 mg aspirin - last dose on 01/18/23  Plavix - lst dose on 10/20 /2024

## 2023-01-11 NOTE — Patient Instructions (Signed)
SURGICAL WAITING ROOM VISITATION  Patients having surgery or a procedure may have no more than 2 support people in the waiting area - these visitors may rotate.    Children under the age of 35 must have an adult with them who is not the patient.  Due to an increase in RSV and influenza rates and associated hospitalizations, children ages 87 and under may not visit patients in [facility] hospitals.  If the patient needs to stay at the hospital during part of their recovery, the visitor guidelines for inpatient rooms apply. Pre-op nurse will coordinate an appropriate time for 1 support person to accompany patient in pre-op.  This support person may not rotate.    Please refer to the [facility] website for the visitor guidelines for Inpatients (after your surgery is over and you are in a regular room).       Your procedure is scheduled on:  100 pm    Report to [facility] Main Entrance    Report to admitting at   100 pm    Call this number if you have problems the morning of surgery [phone]   Do not eat food :After Midnight.   After Midnight you may have the following liquids until ___ 1200 noon ___ AM DAY OF SURGERY  Water Non-Citrus Juices (without pulp, NO RED-Apple, White grape, White cranberry) Black Coffee (NO MILK/CREAM OR CREAMERS, sugar ok)  Clear Tea (NO MILK/CREAM OR CREAMERS, sugar ok) regular and decaf                             Plain Jell-O (NO RED)                                           Fruit ices (not with fruit pulp, NO RED)                                     Popsicles (NO RED)                                                               Sports drinks like Gatorade (NO RED)                   The day of surgery:  Drink ONE (1) Pre-Surgery Clear Ensure or G2 at   100 pm ( have completed by )  the morning of surgery. Drink in one sitting. Do not sip.  This drink was given to you during your hospital  pre-op appointment visit. Nothing else to  drink after completing the  Pre-Surgery Clear Ensure or G2.          If you have questions, please contact your surgeon's office.       Oral Hygiene is also important to reduce your risk of infection.                                    Remember - BRUSH YOUR TEETH  THE MORNING OF SURGERY WITH YOUR REGULAR TOOTHPASTE  DENTURES WILL BE REMOVED PRIOR TO SURGERY PLEASE DO NOT APPLY "Poly grip" OR ADHESIVES!!!   Do NOT smoke after Midnight   Stop all vitamins and herbal supplements 7 days before surgery.   Take these medicines the morning of surgery with A SIP OF WATER:   nexium, pepcid, metoprolol   DO NOT TAKE ANY ORAL DIABETIC MEDICATIONS DAY OF YOUR SURGERY  Bring CPAP mask and tubing day of surgery.                              You may not have any metal on your body including hair pins, jewelry, and body piercing             Do not wear make-up, lotions, powders, perfumes/cologne, or deodorant  Do not wear nail polish including gel and S&S, artificial/acrylic nails, or any other type of covering on natural nails including finger and toenails. If you have artificial nails, gel coating, etc. that needs to be removed by a nail salon please have this removed prior to surgery or surgery may need to be canceled/ delayed if the surgeon/ anesthesia feels like they are unable to be safely monitored.   Do not shave  48 hours prior to surgery.               Men may shave face and neck.   Do not bring valuables to the hospital. [facility] IS NOT             RESPONSIBLE   FOR VALUABLES.   Contacts, glasses, dentures or bridgework may not be worn into surgery.   Bring small overnight bag day of surgery.   DO NOT BRING YOUR HOME MEDICATIONS TO THE HOSPITAL. PHARMACY WILL DISPENSE MEDICATIONS LISTED ON YOUR MEDICATION LIST TO YOU DURING YOUR ADMISSION IN THE HOSPITAL!    Patients discharged on the day of surgery will not be allowed to drive home.  Someone NEEDS to stay with you for the  first 24 hours after anesthesia.   Special Instructions: Bring a copy of your healthcare power of attorney and living will documents the day of surgery if you haven't scanned them before.              Please read over the following fact sheets you were given: IF YOU HAVE QUESTIONS ABOUT YOUR PRE-OP INSTRUCTIONS PLEASE CALL [phone]   If you received a COVID test during your pre-op visit  it is requested that you wear a mask when out in public, stay away from anyone that may not be feeling well and notify your surgeon if you develop symptoms. If you test positive for Covid or have been in contact with anyone that has tested positive in the last 10 days please notify you surgeon.    [place] - Preparing for Surgery Before surgery, you can play an important role.  Because skin is not sterile, your skin needs to be as free of germs as possible.  You can reduce the number of germs on your skin by washing with CHG (chlorahexidine gluconate) soap before surgery.  CHG is an antiseptic cleaner which kills germs and bonds with the skin to continue killing germs even after washing. Please DO NOT use if you have an allergy to CHG or antibacterial soaps.  If your skin becomes reddened/irritated stop using the CHG and inform your nurse when you arrive at  Short Stay. Do not shave (including legs and underarms) for at least 48 hours prior to the first CHG shower.  You may shave your face/neck. Please follow these instructions carefully:  1.  Shower with CHG Soap the night before surgery and the  morning of Surgery.  2.  If you choose to wash your hair, wash your hair first as usual with your  normal  shampoo.  3.  After you shampoo, rinse your hair and body thoroughly to remove the  shampoo.                           4.  Use CHG as you would any other liquid soap.  You can apply chg directly  to the skin and wash                       Gently with a scrungie or clean washcloth.  5.  Apply the CHG Soap to  your body ONLY FROM THE NECK DOWN.   Do not use on face/ open                           Wound or open sores. Avoid contact with eyes, ears mouth and genitals (private parts).                       Wash face,  Genitals (private parts) with your normal soap.             6.  Wash thoroughly, paying special attention to the area where your surgery  will be performed.  7.  Thoroughly rinse your body with warm water from the neck down.  8.  DO NOT shower/wash with your normal soap after using and rinsing off  the CHG Soap.                9.  Pat yourself dry with a clean towel.            10.  Wear clean pajamas.            11.  Place clean sheets on your bed the night of your first shower and do not  sleep with pets. Day of Surgery : Do not apply any lotions/deodorants the morning of surgery.  Please wear clean clothes to the hospital/surgery center.  FAILURE TO FOLLOW THESE INSTRUCTIONS MAY RESULT IN THE CANCELLATION OF YOUR SURGERY PATIENT SIGNATURE_________________________________  NURSE SIGNATURE__________________________________  ________________________________________________________________________

## 2023-01-15 ENCOUNTER — Other Ambulatory Visit: Payer: Self-pay

## 2023-01-15 ENCOUNTER — Encounter (HOSPITAL_COMMUNITY): Payer: Self-pay

## 2023-01-15 ENCOUNTER — Encounter (HOSPITAL_COMMUNITY)
Admission: RE | Admit: 2023-01-15 | Discharge: 2023-01-15 | Disposition: A | Discharge status: Home / Self Care | Payer: Medicare Other | Source: Ambulatory Visit | Attending: General Surgery | Admitting: General Surgery

## 2023-01-15 VITALS — BP 140/79 | HR 57 | Temp 98.2°F | Resp 16 | Ht 63.5 in | Wt 145.0 lb

## 2023-01-15 DIAGNOSIS — Z01818 Encounter for other preprocedural examination: Secondary | ICD-10-CM | POA: Diagnosis present

## 2023-01-15 HISTORY — DX: Nausea with vomiting, unspecified: R11.2

## 2023-01-15 HISTORY — DX: Other specified postprocedural states: R11.2

## 2023-01-15 HISTORY — DX: Other specified postprocedural states: Z98.890

## 2023-01-15 LAB — BASIC METABOLIC PANEL
Anion gap: 8 (ref 5–15)
BUN: 22 mg/dL (ref 8–23)
CO2: 28 mmol/L (ref 22–32)
Calcium: 9.5 mg/dL (ref 8.9–10.3)
Chloride: 99 mmol/L (ref 98–111)
Creatinine, Ser: 0.66 mg/dL (ref 0.44–1.00)
GFR, Estimated: >60 mL/min (ref >60)
Glucose, Bld: 101 mg/dL — ABNORMAL HIGH (ref 70–99)
Potassium: 4.3 mmol/L (ref 3.5–5.1)
Sodium: 135 mmol/L (ref 135–145)

## 2023-01-15 LAB — CBC
HCT: 41.6 % (ref 36.0–46.0)
Hemoglobin: 13.8 g/dL (ref 12.0–15.0)
MCH: 31.1 pg (ref 26.0–34.0)
MCHC: 33.2 g/dL (ref 30.0–36.0)
MCV: 93.7 fL (ref 80.0–100.0)
Platelets: 247 10*3/uL (ref 150–400)
RBC: 4.44 MIL/uL (ref 3.87–5.11)
RDW: 11.9 % (ref 11.5–15.5)
WBC: 3.6 10*3/uL — ABNORMAL LOW (ref 4.0–10.5)
nRBC: 0 % (ref 0.0–0.2)

## 2023-01-16 NOTE — Progress Notes (Signed)
Anesthesia Chart Review   Case: [id number] Date/Time: 01/25/23 1445   Procedure: LAPAROSCOPIC CHOLECYSTECTOMY   Anesthesia type: General   Pre-op diagnosis: GALLSTONES   Location: [facility] 01 / [facility]   Surgeons: Rodman Pickle, MD       DISCUSSION:67 y.o. former smoker with h/o PONV, HTN, CAD s/p DES to RCA 2020, gallstones scheduled for above procedure 01/25/2023 with Dr. Feliciana Rossetti.   Per cardiology preoperative evaluation 12/10/2022, "According to the Revised Cardiac Risk Index (RCRI), her Perioperative Risk of Major Cardiac Event is (%): 0.9   Her Functional Capacity in METs is: 9.89 according to the Duke Activity Status Index (DASI).     Per office protocol, if patient is without any new symptoms or concerns at the time of their virtual visit, he/she may hold ASA for 7 days and Plavix for 5 days prior to procedure. Please resume ASA and Plavix as soon as possible postprocedure, at the discretion of the surgeon.     Therefore, based on ACC/AHA guidelines, patient would be at acceptable risk for the planned procedure without further cardiovascular testing. I will route this recommendation to the requesting party via Epic fax function."  VS: BP (!) 140/79   Pulse (!) 57   Temp 36.8 C (Oral)   Resp 16   Ht 5' 3.5" (1.613 m)   Wt 65.8 kg   SpO2 100%   BMI 25.28 kg/m   PROVIDERS: Corwin Levins, MD is PCP   Nanetta Batty, MD is Cardiologist  LABS: Labs reviewed: Acceptable for surgery. (all labs ordered are listed, but only abnormal results are displayed)  Labs Reviewed  BASIC METABOLIC PANEL - Abnormal; Notable for the following components:      Result Value   Glucose, Bld 101 (*)    All other components within normal limits  CBC - Abnormal; Notable for the following components:   WBC 3.6 (*)    All other components within normal limits     IMAGES:   EKG:   CV: Echo 10/06/2018 1. The left ventricle has normal systolic function, with an ejection   fraction of 55-60%. The cavity size was normal. Left ventricular diastolic  parameters were normal. No evidence of left ventricular regional wall  motion abnormalities.   2. The right ventricle has normal systolic function. The cavity was  normal. There is no increase in right ventricular wall thickness. Right  ventricular systolic pressure could not be assessed.   3. No evidence of mitral valve stenosis.   4. The aortic valve is tricuspid. No stenosis of the aortic valve.   5. The aortic root and aortic arch are normal in size and structure.   Past Medical History:  Diagnosis Date   Allergy    SEASONAL   Arthritis    CAD (coronary artery disease), native coronary artery    a. Cath 10/06/2018 - S/p DES to [facility]; medical therapy for high grade ostial/pro 2nd digonal    Cataracts, bilateral    immature   Chronic back pain    Coronary artery disease    DIVERTICULOSIS, COLON 10/28/2007   Dry skin    red patch on right knee area   Gallstones    GERD 10/28/2007   takes Prevacid daily   H/O hiatal hernia    HLD (hyperlipidemia) 12/12/2017   Hx of colonic polyps    HYPERLIPIDEMIA 10/28/2007   unable to take meds d/t allergies   HYPERTENSION 10/28/2007   takes Diovan daily   Internal  hemorrhoids    Irritable bowel syndrome (IBS)    Joint pain    Joint swelling    LIVER FUNCTION TESTS, ABNORMAL, HX OF 10/29/2007   Lumbar disc disease    Myocardial infarction (HCC)    Obesity    OSTEOPENIA 10/29/2007   Osteoporosis    PONV (postoperative nausea and vomiting)    with 2013  neck surgery   Presbyacusis 01/04/2010   Rosacea    Seasonal allergies    takes Clarinex daily   Vertigo    hx of   Vitamin D deficiency    takes Vit D daily    Past Surgical History:  Procedure Laterality Date   ANTERIOR CERVICAL DECOMP/DISCECTOMY FUSION  08/02/2011   Procedure: ANTERIOR CERVICAL DECOMPRESSION/DISCECTOMY FUSION 3 LEVELS;  Surgeon: Mariam Dollar, MD;  Location: [facility]UR[facility];  Service:  Neurosurgery;  Laterality: N/A;  Cervical Three-Four, Cervical Four-Five, Cervical Five-Six  Anterior Cervical Decompression Fusion    CARDIAC CATHETERIZATION     COLONOSCOPY  2001/2013   CORONARY STENT INTERVENTION N/A 10/06/2018   Procedure: CORONARY STENT INTERVENTION;  Surgeon: Runell Gess, MD;  Location: [facility] INVASIVE CV LAB;  Service: Cardiovascular;  Laterality: N/A;   ESOPHAGOGASTRODUODENOSCOPY     LEFT HEART CATH AND CORONARY ANGIOGRAPHY N/A 10/06/2018   Procedure: LEFT HEART CATH AND CORONARY ANGIOGRAPHY;  Surgeon: Runell Gess, MD;  Location: [facility] INV[facility] CV LAB;  Service: Cardiovascular;  Laterality: N/A;   POLYPECTOMY     TONSILLECTOMY     TONSILLECTOMY  at age 12    MEDICATIONS:  aspirin 81 MG tablet   atorvastatin (LIPITOR) 10 MG tablet   Calcium Citrate (CITRACAL PO)   Cholecalciferol (VITAMIN D) 2000 UNITS CAPS   clopidogrel (PLAVIX) 75 MG tablet   esomeprazole (NEXIUM) 40 MG capsule   estradiol (ESTRACE) 0.1 MG/GM vaginal cream   ezetimibe (ZETIA) 10 MG tablet   famotidine (PEPCID) 40 MG tablet   metoprolol tartrate (LOPRESSOR) 25 MG tablet   metroNIDAZOLE (FLAGYL) 500 MG tablet   metroNIDAZOLE (METROGEL) 1 % gel   Multiple Vitamin (MULITIVITAMIN WITH MINERALS) TABS   NEXLETOL 180 MG TABS   nitroGLYCERIN (NITROSTAT) 0.4 MG SL tablet   oxyCODONE (ROXICODONE) 5 MG immediate release tablet   pimecrolimus (ELIDEL) 1 % cream   valsartan-hydrochlorothiazide (DIOVAN-HCT) 160-12.5 MG tablet   No current facility-administered medications for this encounter.     Jodell Cipro Ward, PA-C [facility] Pre-Surgical Testing [phone]

## 2023-01-16 NOTE — Anesthesia Preprocedure Evaluation (Addendum)
Anesthesia Evaluation  Patient identified by MRN, date of birth, ID band Patient awake    Reviewed: Allergy & Precautions, H&P , NPO status , Patient's Chart, lab work & pertinent test results, reviewed documented beta blocker date and time   History of Anesthesia Complications (+) PONV and history of anesthetic complications  Airway Mallampati: II   Neck ROM: full    Dental  (+) Teeth Intact, Dental Advisory Given, Caps,    Pulmonary former smoker   breath sounds clear to auscultation       Cardiovascular hypertension, Pt. on home beta blockers (-) angina + CAD, + Past MI and + Cardiac Stents (RCA)   Rhythm:regular Rate:Normal     Neuro/Psych  Neuromuscular disease    GI/Hepatic hiatal hernia,GERD  Medicated and Controlled,,GALLSTONES   Endo/Other  negative endocrine ROS    Renal/GU negative Renal ROS     Musculoskeletal  (+) Arthritis ,    Abdominal   Peds  Hematology   Anesthesia Other Findings   Reproductive/Obstetrics                             Anesthesia Physical Anesthesia Plan  ASA: 3  Anesthesia Plan: General   Post-op Pain Management:    Induction: Intravenous  PONV Risk Score and Plan: 4 or greater and Ondansetron, Dexamethasone, Midazolam, Scopolamine patch - Pre-op, Treatment may vary due to age or medical condition and TIVA  Airway Management Planned: Oral ETT  Additional Equipment:   Intra-op Plan:   Post-operative Plan: Extubation in OR  Informed Consent: I have reviewed the patients History and Physical, chart, labs and discussed the procedure including the risks, benefits and alternatives for the proposed anesthesia with the patient or authorized representative who has indicated his/her understanding and acceptance.     Dental advisory given  Plan Discussed with: CRNA, Anesthesiologist and Surgeon  Anesthesia Plan Comments: (See PAT note 01/15/2023,  Christeen Douglas, PA)       Anesthesia Quick Evaluation

## 2023-01-23 ENCOUNTER — Other Ambulatory Visit: Payer: Self-pay | Admitting: Internal Medicine

## 2023-01-25 ENCOUNTER — Encounter (HOSPITAL_COMMUNITY): Payer: Self-pay | Admitting: General Surgery

## 2023-01-25 ENCOUNTER — Observation Stay (HOSPITAL_COMMUNITY)
Admission: RE | Admit: 2023-01-25 | Discharge: 2023-01-26 | Disposition: A | Discharge status: Home / Self Care | Payer: Medicare Other | Source: Ambulatory Visit | Attending: General Surgery | Admitting: General Surgery

## 2023-01-25 ENCOUNTER — Other Ambulatory Visit: Payer: Self-pay

## 2023-01-25 ENCOUNTER — Ambulatory Visit (HOSPITAL_BASED_OUTPATIENT_CLINIC_OR_DEPARTMENT_OTHER): Payer: Medicare Other | Admitting: Anesthesiology

## 2023-01-25 ENCOUNTER — Ambulatory Visit (HOSPITAL_COMMUNITY): Payer: Medicare Other | Admitting: Physician Assistant

## 2023-01-25 ENCOUNTER — Encounter (HOSPITAL_COMMUNITY)
Admission: RE | Disposition: A | Discharge status: Home / Self Care | Payer: Self-pay | Source: Ambulatory Visit | Attending: General Surgery

## 2023-01-25 DIAGNOSIS — Z87891 Personal history of nicotine dependence: Secondary | ICD-10-CM | POA: Insufficient documentation

## 2023-01-25 DIAGNOSIS — I10 Essential (primary) hypertension: Secondary | ICD-10-CM | POA: Insufficient documentation

## 2023-01-25 DIAGNOSIS — Z955 Presence of coronary angioplasty implant and graft: Secondary | ICD-10-CM | POA: Diagnosis not present

## 2023-01-25 DIAGNOSIS — Z01818 Encounter for other preprocedural examination: Secondary | ICD-10-CM

## 2023-01-25 DIAGNOSIS — K8020 Calculus of gallbladder without cholecystitis without obstruction: Secondary | ICD-10-CM | POA: Diagnosis present

## 2023-01-25 DIAGNOSIS — Z9049 Acquired absence of other specified parts of digestive tract: Principal | ICD-10-CM

## 2023-01-25 DIAGNOSIS — I2510 Atherosclerotic heart disease of native coronary artery without angina pectoris: Secondary | ICD-10-CM | POA: Diagnosis not present

## 2023-01-25 DIAGNOSIS — Z79899 Other long term (current) drug therapy: Secondary | ICD-10-CM | POA: Diagnosis not present

## 2023-01-25 DIAGNOSIS — K8012 Calculus of gallbladder with acute and chronic cholecystitis without obstruction: Secondary | ICD-10-CM | POA: Diagnosis present

## 2023-01-25 DIAGNOSIS — Z7982 Long term (current) use of aspirin: Secondary | ICD-10-CM | POA: Diagnosis not present

## 2023-01-25 HISTORY — PX: CHOLECYSTECTOMY: SHX55

## 2023-01-25 SURGERY — LAPAROSCOPIC CHOLECYSTECTOMY
Anesthesia: General

## 2023-01-25 MED ORDER — FENTANYL CITRATE (PF) 100 MCG/2ML IJ SOLN
INTRAMUSCULAR | Status: AC
  Filled 2023-01-25: qty 2

## 2023-01-25 MED ORDER — DIPHENHYDRAMINE HCL 12.5 MG/5ML PO LIQD: 12.5000 mg | Freq: Four times a day (QID) | ORAL | Status: DC | PRN

## 2023-01-25 MED ORDER — ENSURE PRE-SURGERY PO LIQD: 296.0000 mL | Freq: Once | ORAL | Status: DC

## 2023-01-25 MED ORDER — ONDANSETRON HCL 4 MG/2ML IJ SOLN: 4.0000 mg | Freq: Four times a day (QID) | INTRAMUSCULAR | Status: DC | PRN

## 2023-01-25 MED ORDER — CHLORHEXIDINE GLUCONATE CLOTH 2 % EX PADS: 6.0000 | MEDICATED_PAD | Freq: Once | CUTANEOUS | Status: DC

## 2023-01-25 MED ORDER — ONDANSETRON HCL 4 MG/2ML IJ SOLN
INTRAMUSCULAR | Status: DC | PRN
  Administered 2023-01-25: 4 mg via INTRAVENOUS

## 2023-01-25 MED ORDER — ACETAMINOPHEN 500 MG PO TABS
ORAL_TABLET | ORAL | Status: AC
  Administered 2023-01-25: 1000 mg via ORAL
  Filled 2023-01-25: qty 2

## 2023-01-25 MED ORDER — BUPIVACAINE-EPINEPHRINE 0.25% -1:200000 IJ SOLN
INTRAMUSCULAR | Status: DC | PRN
  Administered 2023-01-25: 20 mL

## 2023-01-25 MED ORDER — ENOXAPARIN SODIUM 40 MG/0.4ML IJ SOSY
40.0000 mg | PREFILLED_SYRINGE | INTRAMUSCULAR | Status: DC
  Filled 2023-01-25: qty 0.4

## 2023-01-25 MED ORDER — OXYCODONE HCL 5 MG/5ML PO SOLN: 5.0000 mg | Freq: Once | ORAL | Status: DC | PRN

## 2023-01-25 MED ORDER — BUPIVACAINE-EPINEPHRINE 0.25% -1:200000 IJ SOLN
INTRAMUSCULAR | Status: AC
  Filled 2023-01-25: qty 1

## 2023-01-25 MED ORDER — DIPHENHYDRAMINE HCL 50 MG/ML IJ SOLN
12.5000 mg | Freq: Once | INTRAMUSCULAR | Status: AC
  Administered 2023-01-25: 12.5 mg via INTRAVENOUS

## 2023-01-25 MED ORDER — SUGAMMADEX SODIUM 200 MG/2ML IV SOLN
INTRAVENOUS | Status: DC | PRN
  Administered 2023-01-25: 200 mg via INTRAVENOUS

## 2023-01-25 MED ORDER — OXYCODONE HCL 5 MG PO TABS
ORAL_TABLET | ORAL | Status: AC
  Filled 2023-01-25: qty 1

## 2023-01-25 MED ORDER — OXYCODONE HCL 5 MG PO TABS: 5.0000 mg | ORAL_TABLET | Freq: Once | ORAL | Status: DC | PRN

## 2023-01-25 MED ORDER — ACETAMINOPHEN 500 MG PO TABS: 1000.0000 mg | ORAL_TABLET | ORAL | Status: AC

## 2023-01-25 MED ORDER — ONDANSETRON HCL 4 MG/2ML IJ SOLN
INTRAMUSCULAR | Status: AC
  Filled 2023-01-25: qty 2

## 2023-01-25 MED ORDER — KETOROLAC TROMETHAMINE 15 MG/ML IJ SOLN
INTRAMUSCULAR | Status: DC | PRN
Start: 2023-01-25 — End: 2023-01-25
  Administered 2023-01-25: 15 mg via INTRAVENOUS

## 2023-01-25 MED ORDER — DEXAMETHASONE SODIUM PHOSPHATE 10 MG/ML IJ SOLN
INTRAMUSCULAR | Status: AC
  Filled 2023-01-25: qty 1

## 2023-01-25 MED ORDER — AMISULPRIDE (ANTIEMETIC) 5 MG/2ML IV SOLN
INTRAVENOUS | Status: AC
  Filled 2023-01-25: qty 4

## 2023-01-25 MED ORDER — ROCURONIUM BROMIDE 100 MG/10ML IV SOLN
INTRAVENOUS | Status: DC | PRN
  Administered 2023-01-25: 40 mg via INTRAVENOUS

## 2023-01-25 MED ORDER — MIDAZOLAM HCL 5 MG/5ML IJ SOLN
INTRAMUSCULAR | Status: DC | PRN
  Administered 2023-01-25: 2 mg via INTRAVENOUS

## 2023-01-25 MED ORDER — KCL IN DEXTROSE-NACL 20-5-0.9 MEQ/L-%-% IV SOLN: INTRAVENOUS | Status: DC

## 2023-01-25 MED ORDER — DEXAMETHASONE SODIUM PHOSPHATE 10 MG/ML IJ SOLN
10.0000 mg | Freq: Once | INTRAMUSCULAR | Status: AC
  Administered 2023-01-25: 10 mg via INTRAVENOUS

## 2023-01-25 MED ORDER — OXYCODONE HCL 5 MG PO TABS: 5.0000 mg | ORAL_TABLET | Freq: Four times a day (QID) | ORAL | 0 refills | Status: DC | PRN

## 2023-01-25 MED ORDER — CEFAZOLIN SODIUM-DEXTROSE 2-4 GM/100ML-% IV SOLN
2.0000 g | INTRAVENOUS | Status: AC
  Administered 2023-01-25: 2 g via INTRAVENOUS

## 2023-01-25 MED ORDER — LACTATED RINGERS IV SOLN: INTRAVENOUS | Status: DC | PRN

## 2023-01-25 MED ORDER — FENTANYL CITRATE PF 50 MCG/ML IJ SOSY
25.0000 ug | PREFILLED_SYRINGE | INTRAMUSCULAR | Status: DC | PRN
  Administered 2023-01-25: 50 ug via INTRAVENOUS
  Administered 2023-01-25 (×2): 25 ug via INTRAVENOUS

## 2023-01-25 MED ORDER — SCOPOLAMINE 1 MG/3DAYS TD PT72
1.0000 | MEDICATED_PATCH | Freq: Once | TRANSDERMAL | Status: DC
  Administered 2023-01-25: 1.5 mg via TRANSDERMAL
  Filled 2023-01-25: qty 1

## 2023-01-25 MED ORDER — FENTANYL CITRATE (PF) 100 MCG/2ML IJ SOLN
INTRAMUSCULAR | Status: DC | PRN
  Administered 2023-01-25: 50 ug via INTRAVENOUS
  Administered 2023-01-25: 25 ug via INTRAVENOUS
  Administered 2023-01-25 (×3): 50 ug via INTRAVENOUS

## 2023-01-25 MED ORDER — PROPOFOL 500 MG/50ML IV EMUL
INTRAVENOUS | Status: DC | PRN
  Administered 2023-01-25: 100 ug/kg/min via INTRAVENOUS

## 2023-01-25 MED ORDER — LACTATED RINGERS IV SOLN: INTRAVENOUS | Status: DC

## 2023-01-25 MED ORDER — MIDAZOLAM HCL 2 MG/2ML IJ SOLN
INTRAMUSCULAR | Status: AC
  Filled 2023-01-25: qty 2

## 2023-01-25 MED ORDER — OXYCODONE HCL 5 MG PO TABS
5.0000 mg | ORAL_TABLET | ORAL | Status: DC | PRN
Start: 2023-01-25 — End: 2023-01-26
  Administered 2023-01-25: 5 mg via ORAL

## 2023-01-25 MED ORDER — HYDROMORPHONE HCL 1 MG/ML IJ SOLN: 1.0000 mg | INTRAMUSCULAR | Status: DC | PRN

## 2023-01-25 MED ORDER — ONDANSETRON 4 MG PO TBDP: 4.0000 mg | ORAL_TABLET | Freq: Four times a day (QID) | ORAL | Status: DC | PRN

## 2023-01-25 MED ORDER — METHOCARBAMOL 500 MG PO TABS: 500.0000 mg | ORAL_TABLET | Freq: Four times a day (QID) | ORAL | Status: DC | PRN

## 2023-01-25 MED ORDER — FENTANYL CITRATE PF 50 MCG/ML IJ SOSY
PREFILLED_SYRINGE | INTRAMUSCULAR | Status: AC
  Filled 2023-01-25: qty 1

## 2023-01-25 MED ORDER — EPHEDRINE 5 MG/ML INJ
INTRAVENOUS | Status: AC
  Filled 2023-01-25: qty 5

## 2023-01-25 MED ORDER — CHLORHEXIDINE GLUCONATE 0.12 % MT SOLN
15.0000 mL | Freq: Once | OROMUCOSAL | Status: AC
  Administered 2023-01-25: 15 mL via OROMUCOSAL

## 2023-01-25 MED ORDER — ONDANSETRON 4 MG PO TBDP: 4.0000 mg | ORAL_TABLET | Freq: Four times a day (QID) | ORAL | 0 refills | Status: DC | PRN

## 2023-01-25 MED ORDER — ONDANSETRON HCL 4 MG/2ML IJ SOLN
4.0000 mg | Freq: Four times a day (QID) | INTRAMUSCULAR | Status: AC | PRN
  Administered 2023-01-25: 4 mg via INTRAVENOUS

## 2023-01-25 MED ORDER — CEFAZOLIN SODIUM-DEXTROSE 2-4 GM/100ML-% IV SOLN
INTRAVENOUS | Status: AC
  Filled 2023-01-25: qty 100

## 2023-01-25 MED ORDER — LIDOCAINE HCL (CARDIAC) PF 100 MG/5ML IV SOSY
PREFILLED_SYRINGE | INTRAVENOUS | Status: DC | PRN
  Administered 2023-01-25: 80 mg via INTRAVENOUS

## 2023-01-25 MED ORDER — ORAL CARE MOUTH RINSE: 15.0000 mL | Freq: Once | OROMUCOSAL | Status: AC

## 2023-01-25 MED ORDER — AMISULPRIDE (ANTIEMETIC) 5 MG/2ML IV SOLN
10.0000 mg | Freq: Once | INTRAVENOUS | Status: AC | PRN
  Administered 2023-01-25: 10 mg via INTRAVENOUS

## 2023-01-25 MED ORDER — IBUPROFEN 800 MG PO TABS: 800.0000 mg | ORAL_TABLET | Freq: Three times a day (TID) | ORAL | 0 refills | Status: DC | PRN

## 2023-01-25 MED ORDER — PROPOFOL 10 MG/ML IV BOLUS
INTRAVENOUS | Status: DC | PRN
  Administered 2023-01-25: 120 mg via INTRAVENOUS

## 2023-01-25 SURGICAL SUPPLY — 47 items
APL PRP STRL LF DISP 70% ISPRP (MISCELLANEOUS) ×1
APL SKNCLS STERI-STRIP NONHPOA (GAUZE/BANDAGES/DRESSINGS) ×1
APPLIER CLIP 5 13 M/L LIGAMAX5 (MISCELLANEOUS) ×1
APPLIER CLIP ROT 10 11.4 M/L (STAPLE)
APR CLP MED LRG 11.4X10 (STAPLE)
APR CLP MED LRG 5 ANG JAW (MISCELLANEOUS) ×1
BAG COUNTER SPONGE SURGICOUNT (BAG) IMPLANT
BAG SPEC RTRVL 10 TROC 200 (ENDOMECHANICALS) ×1
BAG SPNG CNTER NS LX DISP (BAG)
BENZOIN TINCTURE PRP APPL 2/3 (GAUZE/BANDAGES/DRESSINGS) IMPLANT
BNDG ADH 1X3 SHEER STRL LF (GAUZE/BANDAGES/DRESSINGS) IMPLANT
BNDG ADH THN 3X1 STRL LF (GAUZE/BANDAGES/DRESSINGS) ×4
CABLE HIGH FREQUENCY MONO STRZ (ELECTRODE) ×2 IMPLANT
CHLORAPREP W/TINT 26 (MISCELLANEOUS) ×2 IMPLANT
CLIP APPLIE 5 13 M/L LIGAMAX5 (MISCELLANEOUS) ×2 IMPLANT
CLIP APPLIE ROT 10 11.4 M/L (STAPLE) IMPLANT
CLIP LIGATING HEM O LOK PURPLE (MISCELLANEOUS) IMPLANT
CLIP LIGATING HEMO O LOK GREEN (MISCELLANEOUS) ×2 IMPLANT
COVER MAYO STAND XLG (MISCELLANEOUS) ×2 IMPLANT
COVER SURGICAL LIGHT HANDLE (MISCELLANEOUS) ×2 IMPLANT
DRAIN CHANNEL 19F RND (DRAIN) IMPLANT
DRAPE C-ARM 42X120 X-RAY (DRAPES) IMPLANT
EVACUATOR SILICONE 100CC (DRAIN) IMPLANT
GLOVE BIOGEL PI IND STRL 7.0 (GLOVE) ×2 IMPLANT
GLOVE SURG SS PI 7.0 STRL IVOR (GLOVE) ×2 IMPLANT
GOWN STRL REUS W/ TWL LRG LVL3 (GOWN DISPOSABLE) ×2 IMPLANT
GOWN STRL REUS W/TWL LRG LVL3 (GOWN DISPOSABLE) ×1
GRASPER SUT TROCAR 14GX15 (MISCELLANEOUS) ×2 IMPLANT
IRRIG SUCT STRYKERFLOW 2 WTIP (MISCELLANEOUS) ×1
IRRIGATION SUCT STRKRFLW 2 WTP (MISCELLANEOUS) ×2 IMPLANT
KIT BASIN OR (CUSTOM PROCEDURE TRAY) ×2 IMPLANT
KIT TURNOVER KIT A (KITS) IMPLANT
POUCH RETRIEVAL ECOSAC 10 (ENDOMECHANICALS) ×2 IMPLANT
SCISSORS LAP 5X35 DISP (ENDOMECHANICALS) ×2 IMPLANT
SET CHOLANGIOGRAPH MIX (MISCELLANEOUS) IMPLANT
SET TUBE SMOKE EVAC HIGH FLOW (TUBING) ×2 IMPLANT
SLEEVE Z-THREAD 5X100MM (TROCAR) ×4 IMPLANT
SPIKE FLUID TRANSFER (MISCELLANEOUS) ×2 IMPLANT
STRIP CLOSURE SKIN 1/2X4 (GAUZE/BANDAGES/DRESSINGS) IMPLANT
SUT ETHILON 2 0 PS N (SUTURE) IMPLANT
SUT MNCRL AB 4-0 PS2 18 (SUTURE) ×2 IMPLANT
SUT VICRYL 0 ENDOLOOP (SUTURE) IMPLANT
TOWEL OR 17X26 10 PK STRL BLUE (TOWEL DISPOSABLE) ×2 IMPLANT
TOWEL OR NON WOVEN STRL DISP B (DISPOSABLE) IMPLANT
TRAY LAPAROSCOPIC (CUSTOM PROCEDURE TRAY) ×2 IMPLANT
TROCAR ADV FIXATION 12X100MM (TROCAR) ×2 IMPLANT
TROCAR Z-THREAD OPTICAL 5X100M (TROCAR) ×2 IMPLANT

## 2023-01-25 NOTE — Progress Notes (Signed)
Patient with chronic nausea has received several antiemetics which helped some but patient c/o chronic nausea still, Dr.Turk in see patient reported would need be admitted for chronic nausea s/p lap chole procedure writer notified Dr.Kinsinger he said would be 1 hour before he would be home to input admission orders for Rn. Report given to Laura,B oncomming Rn.  Patient's family member back to see her Chip Boer) to bring her overnight bag to her.

## 2023-01-25 NOTE — H&P (Signed)
Chief Complaint: Discuss [place] results  History of Present Illness: Heather Merritt is a 67 y.o. female who is seen today for gallbladder follow up.  She has not had any attacks in the last month. She does get occasional back pain with certain foods. She has followed a low fat diet.  She underwent US showing stones and sludge. She would like proceed with surgery.  Review of Systems: A complete review of systems was obtained from the patient. I have reviewed this information and discussed as appropriate with the patient. See HPI as well for other ROS.  Review of Systems  Constitutional: Negative.  HENT: Negative.  Eyes: Negative.  Respiratory: Negative.  Cardiovascular: Negative.  Gastrointestinal: Negative.  Genitourinary: Negative.  Musculoskeletal: Negative.  Skin: Negative.  Neurological: Negative.  Endo/Heme/Allergies: Negative.  Psychiatric/Behavioral: Negative.    Medical History: Past Medical History:  Diagnosis Date  Arthritis  GERD (gastroesophageal reflux disease)  Hyperlipidemia  Hypertension   There is no problem list on file for this patient.  Past Surgical History:  Procedure Laterality Date  DISCECTOMY SURGERY 08/01/2016  CORONARY STENT 10/06/2018  EGD 10/06/2018  LEFT CARDIAC CATH 10/06/2018  CARDIAC CATH  2001, 2013  COLONOSCOPY  TONSILLECTOMY    Allergies  Allergen Reactions  Sulfa (Sulfonamide Antibiotics) Anaphylaxis, Nausea, Nausea And Vomiting and Other (See Comments)  Nausea, vomiting, abd pain  also abd pain  Penicillins Hives, Itching and Rash  Did it involve swelling of the face/tongue/throat, SOB, or low BP? Unknown Did it involve sudden or severe rash/hives, skin peeling, or any reaction on the inside of your mouth or nose? Unknown Did you need to seek medical attention at a hospital or doctor's office? Unknown When did it last happen? pt cant recall If all above answers are "NO", may proceed with cephalosporin use.  Did it involve  swelling of the face/tongue/throat, SOB, or low BP? Unknown, Did it involve sudden or severe rash/hives, skin peeling, or any reaction on the inside of your mouth or nose? Unknown, Did you need to seek medical attention at a hospital or doctor's office? Unknown, When did it last happen? pt cant recall, If all above answers are "NO", may proceed with cephalosporin use.  Statins-Hmg-Coa Reductase Inhibitors Other (See Comments), Nausea and Vomiting  REACTION: muscle weakness  Sulfur (Not Sulfa) Hives  Evolocumab Swelling and Other (See Comments)  Swelling, itchy eyes, mouth swelling, red/puffy/hot face  Other Other (See Comments)  Sulfites Other (See Comments)  Doxycycline Itching and Rash  Ticagrelor Rash  Itchy rash to torso   Current Outpatient Medications on File Prior to Visit  Medication Sig Dispense Refill  aspirin 81 MG chewable tablet  atorvastatin (LIPITOR) 10 MG tablet Take by mouth daily  calcium citrate-vitamin D3 (CITRACAL+D PETITES) 200 mg-6.25 mcg (250 unit) tablet Take 2 tablets by mouth once daily  cholecalciferol (VITAMIN D3) 2,000 unit capsule Take 2,000 Units by mouth once daily  clopidogreL (PLAVIX) 75 mg tablet  ezetimibe (ZETIA) 10 mg tablet  famotidine (PEPCID) 40 MG tablet Take 1 tablet by mouth once daily  metoprolol tartrate (LOPRESSOR) 25 MG tablet  metroNIDAZOLE (FLAGYL) 500 MG tablet Take 500 mg by mouth 2 (two) times daily  multivitamin with iron-minerals (SUPER THERA VITE M) tablet Take 1 tablet by mouth  NEXLETOL 180 mg tablet  nitroGLYcerin (NITROSTAT) 0.4 MG SL tablet as directed  pimecrolimus (ELIDEL) 1 % cream 1 APPLICCATION TO AFFECTED AREA TWICE A DAY  valsartan-hydroCHLOROthiazide (DIOVAN-HCT) 160-12.5 mg tablet Take 1 tablet by mouth once daily  No current facility-administered medications on file prior to visit.   History reviewed. No pertinent family history.   Social History   Tobacco Use  Smoking Status Former  Current packs/day: 0.00   Types: Cigarettes  Quit date: 1986  Years since quitting: 38.7  Smokeless Tobacco Never    Social History   Socioeconomic History  Marital status: Divorced  Tobacco Use  Smoking status: Former  Current packs/day: 0.00  Types: Cigarettes  Quit date: 1986  Years since quitting: 38.7  Smokeless tobacco: Never  Substance and Sexual Activity  Alcohol use: Yes  Drug use: Never   Social Determinants of Health   Financial Resource Strain: Low Risk (11/19/2022)  Received from [facility]  Overall Financial Resource Strain (CARDIA)  Difficulty of Paying Living Expenses: Not hard at all  Food Insecurity: No Food Insecurity (11/19/2022)  Received from [facility]  Hunger Vital Sign  Worried About Running Out of Food in the Last Year: Never true  Ran Out of Food in the Last Year: Never true  Transportation Needs: No Transportation Needs (11/19/2022)  Received from [facility] - Transportation  Lack of Transportation (Medical): No  Lack of Transportation (Non-Medical): No  Physical Activity: Sufficiently Active (11/19/2022)  Received from [facility]  Exercise Vital Sign  Days of Exercise per Week: 3 days  Minutes of Exercise per Session: 60 min  Stress: No Stress Concern Present (11/19/2022)  Received from [facility] Occupational Health - Occupational Stress Questionnaire  Feeling of Stress : Not at all  Social Connections: Unknown (11/19/2022)  Received from  [facility]  Social Connection and Isolation Panel [NHANES]  Frequency of Communication with Friends and Family: More than three times a week  Frequency of Social Gatherings with Friends and Family: More than three times a week  Attends Religious Services: Patient declined  Database administrator or Organizations: No  Attends Banker Meetings: Patient declined  Marital Status: Divorced  Recent Concern: Social Connections - Socially Isolated (09/23/2022)  Received from [facility]   Social Connection and Isolation Panel [NHANES]  Frequency of Communication with Friends and Family: Three times a week  Frequency of Social Gatherings with Friends and Family: Twice a week  Attends Religious Services: Never  Database administrator or Organizations: No  Marital Status: Divorced   Objective:   Vitals:  12/05/22 0908  PainSc: 0-No pain   There is no height or weight on file to calculate BMI.  Physical Exam Constitutional:  Appearance: Normal appearance.  HENT:  Head: Normocephalic and atraumatic.  Pulmonary:  Effort: Pulmonary effort is normal.  Musculoskeletal:  General: Normal range of motion.  Cervical back: Normal range of motion.  Neurological:  General: No focal deficit present.  Mental Status: She is alert and oriented to person, place, and time. Mental status is at baseline.  Psychiatric:  Mood and Affect: Mood normal.  Behavior: Behavior normal.  Thought Content: Thought content normal.   Labs, Imaging and Diagnostic Testing:  I reviewed images of US showing sludge and stones throughout the gallbladder.  Assessment and Plan:   Diagnoses and all orders for this visit:  Calculus of gallbladder without cholecystitis without obstruction  We discussed the etiology of gallstones and probably cause pain. We discussed exacerbating factors including fatty meals. We discussed the details of surgery for removal of the gallbladder including general anesthesia, 4 small incisions in the patient's abdomen, removal of the patient's gallbladder with the liver and common  bile duct, and most likely outpatient procedure. We discussed risks of common bile duct injury, cystic duct stump leak, injury to liver, bleeding, infection, need for open procedure, and post cholecystectomy syndrome. The patient showed good understanding and wanted to proceed with laparoscopic cholecystectomy.

## 2023-01-25 NOTE — Anesthesia Postprocedure Evaluation (Signed)
Anesthesia Post Note  Patient: AUBRIAUNA WHITTIER  Procedure(s) Performed: LAPAROSCOPIC CHOLECYSTECTOMY     Patient location during evaluation: PACU Anesthesia Type: General Level of consciousness: awake and alert Pain management: pain level controlled Vital Signs Assessment: post-procedure vital signs reviewed and stable Respiratory status: spontaneous breathing, nonlabored ventilation, respiratory function stable and patient connected to nasal cannula oxygen Cardiovascular status: blood pressure returned to baseline and stable Anesthetic complications: no Comments: Persistent nausea despite TIVA, multimodal anti-nausea medications. Will need to admit for IV hydration, observation.   No notable events documented.  Last Vitals:  Vitals:   01/25/23 1830 01/25/23 1845  BP: 130/62 120/60  Pulse: 60 (!) 52  Resp: 20 18  Temp: 36.4 C   SpO2: 94% 95%    Last Pain:  Vitals:   01/25/23 1830  TempSrc:   PainSc: 0-No pain                 Collene Schlichter

## 2023-01-25 NOTE — Op Note (Signed)
PATIENT:  Heather Merritt  67 y.o. female  PRE-OPERATIVE DIAGNOSIS:  GALLSTONES  POST-OPERATIVE DIAGNOSIS:  GALLSTONES  PROCEDURE:  Procedure(s): LAPAROSCOPIC CHOLECYSTECTOMY   SURGEON:  Blima Jaimes, De Blanch, MD   ASSISTANT: none  ANESTHESIA:   local and general  Indications for procedure: Heather Merritt is a 67 y.o. female with symptoms of Abdominal pain consistent with gallbladder disease, Confirmed by ultrasound.  Description of procedure: The patient was brought into the operative suite, placed supine. Anesthesia was administered with endotracheal tube. Patient was strapped in place and foot board was secured. All pressure points were offloaded by foam padding. The patient was prepped and draped in the usual sterile fashion.  A periumbilical incision was made and optical entry was used to enter the abdomen. 2 5 mm trocars were placed on in the right lateral space on in the right subcostal space. A 12mm trocar was placed in the subxiphoid space. Marcaine was infused to the subxiphoid space and lateral upper right abdomen in the transversus abdominis plane. Next the patient was placed in reverse trendelenberg. The gallbladder appeareddilated and chronically inflamed. Omentum was adhered to the gallbladder and was taken down with cautery/blunt dissection.  The gallbladder was retracted cephalad and lateral. The peritoneum was reflected off the infundibulum working lateral to medial. The cystic duct and cystic artery were identified and further dissection revealed a critical view. The cystic duct and cystic artery were doubly clipped and ligated.   The gallbladder was removed off the liver bed with cautery. The Gallbladder was placed in a specimen bag. The gallbladder fossa was irrigated and hemostasis was applied with cautery. The gallbladder was removed via the 12mm trocar. The fascial defect was closed with interrupted 0 vicryl suture via laparoscopic trans-fascial suture passer.  Pneumoperitoneum was removed, all trocar were removed. All incisions were closed with 4-0 monocryl subcuticular stitch. The patient woke from anesthesia and was brought to PACU in stable condition. All counts were correct  Findings: chronic cholecystitis  Specimen: gallbladder  Blood loss: 20 ml  Local anesthesia: 20 ml Marcaine  Complications: none  PLAN OF CARE: Discharge to home after PACU  PATIENT DISPOSITION:  PACU - hemodynamically stable.   De Blanch [facility], [place]

## 2023-01-25 NOTE — Transfer of Care (Signed)
Immediate Anesthesia Transfer of Care Note  Patient: Heather Merritt  Procedure(s) Performed: LAPAROSCOPIC CHOLECYSTECTOMY  Patient Location: PACU  Anesthesia Type:General  Level of Consciousness: awake and alert   Airway & Oxygen Therapy: Patient Spontanous Breathing and Patient connected to nasal cannula oxygen  Post-op Assessment: Report given to RN and Post -op Vital signs reviewed and stable  Post vital signs: Reviewed and stable  Last Vitals:  Vitals Value Taken Time  BP 128/69 01/25/23 1626  Temp    Pulse 56 01/25/23 1627  Resp 9 01/25/23 1627  SpO2 94 % 01/25/23 1627  Vitals shown include unfiled device data.  Last Pain:  Vitals:   01/25/23 1304  PainSc: 0-No pain         Complications: No notable events documented.

## 2023-01-25 NOTE — Anesthesia Procedure Notes (Signed)
Procedure Name: Intubation Date/Time: 01/25/2023 3:04 PM  Performed by: Deri Fuelling, CRNAPre-anesthesia Checklist: Patient identified, Emergency Drugs available, Suction available and Patient being monitored Patient Re-evaluated:Patient Re-evaluated prior to induction Oxygen Delivery Method: Circle system utilized Preoxygenation: Pre-oxygenation with 100% oxygen Induction Type: IV induction Ventilation: Mask ventilation without difficulty Laryngoscope Size: Mac and 3 Grade View: Grade II Tube type: Oral Tube size: 7.0 mm Number of attempts: 1 Airway Equipment and Method: Stylet and Oral airway Placement Confirmation: ETT inserted through vocal cords under direct vision, positive ETCO2 and breath sounds checked- equal and bilateral Secured at: 22 cm Tube secured with: Tape Dental Injury: Teeth and Oropharynx as per pre-operative assessment

## 2023-01-26 DIAGNOSIS — K8012 Calculus of gallbladder with acute and chronic cholecystitis without obstruction: Secondary | ICD-10-CM | POA: Diagnosis not present

## 2023-01-26 MED ORDER — ALBUMIN HUMAN 5 % IV SOLN
INTRAVENOUS | Status: AC
  Administered 2023-01-26: 12.5 g via INTRAVENOUS
  Filled 2023-01-26: qty 250

## 2023-01-26 MED ORDER — KCL IN DEXTROSE-NACL 20-5-0.9 MEQ/L-%-% IV SOLN
INTRAVENOUS | Status: AC
  Filled 2023-01-26: qty 1000

## 2023-01-26 MED ORDER — ALBUMIN HUMAN 5 % IV SOLN: 12.5000 g | Freq: Once | INTRAVENOUS | Status: AC

## 2023-01-26 MED ORDER — ONDANSETRON HCL 4 MG PO TABS: 4.0000 mg | ORAL_TABLET | Freq: Three times a day (TID) | ORAL | 0 refills | Status: DC | PRN

## 2023-01-26 NOTE — Progress Notes (Addendum)
AVS reviewed w/ pt using teach back method. Discharge meds reviewed w/ pt - pt was given 2 prescriptions for zofran - pt verbalized an understanding that her insurance may not cover both. Bandaids CDI to lab sites. Pt will remove later on today when she showers. Removal of scopolamine patch reviewed w/ patient, pt verbalized precautions. PIV removed as noted. Pt to main lobby via w/c to car where her friend was waiting. Pt had concerns about hospital charges since she was in PACU until approx 25 mins ago. This RN will call pt later on today with more info or contact info regarding billing questions.  Late entry at 1525: Call back to [facility] with a follow up # [phone], patient experience to obtain more information about her stay regarding her concerns for billing since she was only in the room for approx 45 min. Pt thanked Charity fundraiser for the return call and contact info. Pt states she is resting and doing well.

## 2023-01-26 NOTE — Discharge Summary (Signed)
Physician Discharge Summary  Patient ID: Heather Merritt MRN: [id number] DOB/AGE: 69-Jan-1957 69 y.o.  Admit date: 01/25/2023 Discharge date: 01/26/2023  Admission Diagnoses:gallstones   Discharge Diagnoses:  Principal Problem:   S/P laparoscopic cholecystectomy Active Problems:   Gallstones   Discharged Condition: good  Hospital Course: pt had PONV and was kept overnight  She felt well the next day and was discharged home      Treatments: surgery: lap cholecystectomy   Discharge Exam: Blood pressure (!) 102/57, pulse 74, temperature 97.6 F (36.4 C), resp. rate (!) 21, height 5' 3.5" (1.613 m), weight 65.8 kg, SpO2 97%. General appearance: alert and cooperative Resp: clear to auscultation bilaterally Cardio: NSR Incision/Wound:CDI  Disposition: Discharge disposition: 01-Home or Self Care       Discharge Instructions     Call MD for:  difficulty breathing, headache or visual disturbances   Complete by: As directed    Call MD for:  persistant nausea and vomiting   Complete by: As directed    Call MD for:  redness, tenderness, or signs of infection (pain, swelling, redness, odor or green/yellow discharge around incision site)   Complete by: As directed    Call MD for:  severe uncontrolled pain   Complete by: As directed    Call MD for:  temperature >100.4   Complete by: As directed    Diet - low sodium heart healthy   Complete by: As directed    Diet - low sodium heart healthy   Complete by: As directed    Discharge wound care:   Complete by: As directed    Remove bandaids tomorrow. Ok to shower tomorrow  Steristrips will likely peel off in 1-3 weeks   Increase activity slowly   Complete by: As directed    Increase activity slowly   Complete by: As directed    Lifting restrictions   Complete by: As directed    Do not lift more than 20 pounds for 3-4 weeks      Allergies as of 01/26/2023       Reactions   Penicillins Hives, Itching, Rash   Did  it involve swelling of the face/tongue/throat, SOB, or low BP? Unknown Did it involve sudden or severe rash/hives, skin peeling, or any reaction on the inside of your mouth or nose? Unknown Did you need to seek medical attention at a hospital or doctor's office? Unknown When did it last happen?      pt cant recall If all above answers are "NO", may proceed with cephalosporin use.   Rosuvastatin Other (See Comments)   REACTION: muscle weakness   Simvastatin Other (See Comments)   REACTION: muscle weakness   Statins Other (See Comments)   Myalgias and weakness   Sulfonamide Derivatives Other (See Comments)   Nausea, vomiting, abd pain   Evolocumab Swelling, Other (See Comments)   Swelling, itchy eyes, mouth swelling, red/puffy/hot face   Misc. Sulfonamide Containing Compounds Other (See Comments)   Other    Penicillin V Potassium Other (See Comments)   Brilinta [ticagrelor] Rash   Itchy rash to torso   Doxycycline Itching, Rash   Doxycycline Hyclate Itching, Rash   Sulfa Antibiotics Other (See Comments)   Nausea, vomiting, abd pain        Medication List     TAKE these medications    aspirin 81 MG tablet Take 81 mg by mouth daily.   atorvastatin 10 MG tablet Commonly known as: LIPITOR TAKE 1 TABLET EVERY DAY  CITRACAL PO Take 2 tablets by mouth daily.   clopidogrel 75 MG tablet Commonly known as: PLAVIX TAKE 1 TABLET EVERY DAY   esomeprazole 40 MG capsule Commonly known as: NEXIUM Take 1 capsule (40 mg total) by mouth in the morning and at bedtime.   estradiol 0.1 MG/GM vaginal cream Commonly known as: ESTRACE Place 1 Applicatorful vaginally every other day.   ezetimibe 10 MG tablet Commonly known as: ZETIA TAKE 1 TABLET EVERY DAY   famotidine 40 MG tablet Commonly known as: PEPCID TAKE 1 TABLET EVERY DAY   ibuprofen 800 MG tablet Commonly known as: ADVIL Take 1 tablet (800 mg total) by mouth every 8 (eight) hours as needed.   metoprolol tartrate 25  MG tablet Commonly known as: LOPRESSOR TAKE 1/2 TABLET TWICE DAILY   metroNIDAZOLE 1 % gel Commonly known as: METROGEL Apply 1 Application topically daily.   metroNIDAZOLE 500 MG tablet Commonly known as: FLAGYL Take 1 tablet (500 mg total) by mouth 2 (two) times daily.   multivitamin with minerals Tabs tablet Take 1 tablet by mouth daily.   Nexletol 180 MG Tabs Generic drug: Bempedoic Acid TAKE 1 TABLET EVERY DAY   nitroGLYCERIN 0.4 MG SL tablet Commonly known as: NITROSTAT Place 1 tablet (0.4 mg total) under the tongue every 5 (five) minutes x 3 doses as needed for chest pain.   ondansetron 4 MG disintegrating tablet Commonly known as: ZOFRAN-ODT Take 1 tablet (4 mg total) by mouth every 6 (six) hours as needed for up to 20 doses for nausea or vomiting (Place under tongue).   ondansetron 4 MG tablet Commonly known as: Zofran Take 1 tablet (4 mg total) by mouth every 8 (eight) hours as needed for nausea or vomiting.   oxyCODONE 5 MG immediate release tablet Commonly known as: Roxicodone Take 1 tablet (5 mg total) by mouth every 6 (six) hours as needed for severe pain. What changed: Another medication with the same name was added. Make sure you understand how and when to take each.   oxyCODONE 5 MG immediate release tablet Commonly known as: Oxy IR/ROXICODONE Take 1 tablet (5 mg total) by mouth every 6 (six) hours as needed for severe pain (pain score 7-10). What changed: You were already taking a medication with the same name, and this prescription was added. Make sure you understand how and when to take each.   pimecrolimus 1 % cream Commonly known as: ELIDEL Apply 1 application topically every morning.   valsartan-hydrochlorothiazide 160-12.5 MG tablet Commonly known as: DIOVAN-HCT TAKE 1 TABLET EVERY DAY   Vitamin D 50 MCG (2000 UT) Caps Take 2,000 Units by mouth daily.               Discharge Care Instructions  (From admission, onward)            Start     Ordered   01/25/23 0000  Discharge wound care:       Comments: Remove bandaids tomorrow. Ok to shower tomorrow  Steristrips will likely peel off in 1-3 weeks   01/25/23 1531            Follow-up Information     Kinsinger, De Blanch, MD Follow up on 02/14/2023.   Specialty: General Surgery Contact information: [address] [place] [address] [phone]                 Signed: Dortha Schwalbe 01/26/2023, 7:46 AM

## 2023-01-26 NOTE — Discharge Instructions (Signed)
[facility] ______[facility] LAPAROSCOPIC SURGERY: POST OP INSTRUCTIONS Always review your discharge instruction sheet given to you by the facility where your surgery was performed. IF YOU HAVE DISABILITY OR FAMILY LEAVE FORMS, YOU MUST BRING THEM TO THE OFFICE FOR PROCESSING.   DO NOT GIVE THEM TO YOUR DOCTOR.  A prescription for pain medication may be given to you upon discharge.  Take your pain medication as prescribed, if needed.  If narcotic pain medicine is not needed, then you may take acetaminophen (Tylenol) or ibuprofen (Advil) as needed. Take your usually prescribed medications unless otherwise directed. If you need a refill on your pain medication, please contact your pharmacy.  They will contact our office to request authorization. Prescriptions will not be filled after 5pm or on week-ends. You should follow a light diet the first few days after arrival home, such as soup and crackers, etc.  Be sure to include lots of fluids daily. Most patients will experience some swelling and bruising in the area of the incisions.  Ice packs will help.  Swelling and bruising can take several days to resolve.  It is common to experience some constipation if taking pain medication after surgery.  Increasing fluid intake and taking a stool softener (such as Colace) will usually help or prevent this problem from occurring.  A mild laxative (Milk of Magnesia or Miralax) should be taken according to package instructions if there are no bowel movements after 48 hours. Unless discharge instructions indicate otherwise, you may remove your bandages 24-48 hours after surgery, and you may shower at that time.  You may have steri-strips (small skin tapes) in place directly over the incision.  These strips should be left on the skin for 7-10 days.  If your surgeon used skin glue on the incision, you may shower in 24 hours.  The glue will flake off over the next 2-3 weeks.  Any sutures or staples will be  removed at the office during your follow-up visit. ACTIVITIES:  You may resume regular (light) daily activities beginning the next day--such as daily self-care, walking, climbing stairs--gradually increasing activities as tolerated.  You may have sexual intercourse when it is comfortable.  Refrain from any heavy lifting or straining until approved by your doctor. You may drive when you are no longer taking prescription pain medication, you can comfortably wear a seatbelt, and you can safely maneuver your car and apply brakes. RETURN TO WORK:  __________________________________________________________ Heather Merritt should see your doctor in the office for a follow-up appointment approximately 2-3 weeks after your surgery.  Make sure that you call for this appointment within a day or two after you arrive home to insure a convenient appointment time. OTHER INSTRUCTIONS: __________________________________________________________________________________________________________________________ __________________________________________________________________________________________________________________________ WHEN TO CALL YOUR DOCTOR: Fever over 101.0 Inability to urinate Continued bleeding from incision. Increased pain, redness, or drainage from the incision. Increasing abdominal pain  The clinic staff is available to answer your questions during regular business hours.  Please don't hesitate to call and ask to speak to one of the nurses for clinical concerns.  If you have a medical emergency, go to the nearest emergency room or call 911.  A surgeon from [facility] is always on call at the hospital. [address], [address], [place], [place]  [address] ? [address] [address], [place], [place]   [address] [phone] ? [phone] ? FAX [phone] Web site: [email or web address]   Restart ASA and plavix  Monday

## 2023-01-27 ENCOUNTER — Encounter (HOSPITAL_COMMUNITY): Payer: Self-pay | Admitting: General Surgery

## 2023-01-28 LAB — SURGICAL PATHOLOGY

## 2023-02-26 NOTE — Progress Notes (Unsigned)
New Patient Note  RE: [name] MRN: [id number] DOB: 1955-08-30 Date of Office Visit: 02/27/2023  Consult requested by: Rodman Pickle, * Primary care provider: Corwin Levins, MD  Chief Complaint: No chief complaint on file.  History of Present Illness: I had the pleasure of seeing Heather Merritt for initial evaluation at the [facility] on 02/26/2023. She is a 67 y.o. female, who is referred here by Corwin Levins, MD for the evaluation of food allergy.  Discussed the use of AI scribe software for clinical note transcription with the patient, who gave verbal consent to proceed.  History of Present Illness             She reports food allergy to ***. The reaction occurred at the age of ***, after she ate *** amount of ***. Symptoms started within *** and was in the form of *** hives, swelling, wheezing, abdominal pain, diarrhea, vomiting. ***Denies any associated cofactors such as exertion, infection, NSAID use, or alcohol consumption. The symptoms lasted for ***. She was evaluated in ED and received ***. Since this episode, she does *** not report other accidental exposures to ***. She does *** not have access to epinephrine autoinjector and *** needed to use it.   Past work up includes: ***. Dietary History: patient has been eating other foods including ***milk, ***eggs, ***peanut, ***treenuts, ***sesame, ***shellfish, ***fish, ***soy, ***wheat, ***meats, ***fruits and ***vegetables.  She reports reading labels and avoiding *** in diet completely. She tolerates ***baked egg and baked milk products.   Assessment and Plan: [name] is a 67 y.o. female with: ***  Assessment and Plan               No follow-ups on file.  No orders of the defined types were placed in this encounter.  Lab Orders  No laboratory test(s) ordered today    Other allergy screening: Asthma: {Blank single:19197::"yes","no"} Rhino conjunctivitis: {Blank  single:19197::"yes","no"} Food allergy: {Blank single:19197::"yes","no"} Medication allergy: {Blank single:19197::"yes","no"} Hymenoptera allergy: {Blank single:19197::"yes","no"} Urticaria: {Blank single:19197::"yes","no"} Eczema:{Blank single:19197::"yes","no"} History of recurrent infections suggestive of immunodeficency: {Blank single:19197::"yes","no"}  Diagnostics: Spirometry:  Tracings reviewed. Her effort: {Blank single:19197::"Good reproducible efforts.","It was hard to get consistent efforts and there is a question as to whether this reflects a maximal maneuver.","Poor effort, data can not be interpreted."} FVC: ***L FEV1: ***L, ***% predicted FEV1/FVC ratio: ***% Interpretation: {Blank single:19197::"Spirometry consistent with mild obstructive disease","Spirometry consistent with moderate obstructive disease","Spirometry consistent with severe obstructive disease","Spirometry consistent with possible restrictive disease","Spirometry consistent with mixed obstructive and restrictive disease","Spirometry uninterpretable due to technique","Spirometry consistent with normal pattern","No overt abnormalities noted given today's efforts"}.  Please see scanned spirometry results for details.  Skin Testing: {Blank single:19197::"Select foods","Environmental allergy panel","Environmental allergy panel and select foods","Food allergy panel","None","Deferred due to recent antihistamines use"}. *** Results discussed with patient/family.   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   URI (upper respiratory infection) 03/19/2021   Lesion of ovary 07/26/2020   Osteopenia 07/26/2020   Polyp of corpus uteri 07/26/2020   Body  mass index (BMI) 31.0-31.9, adult 03/17/2020   Hyperglycemia 10/15/2018   NSTEMI (non-ST elevated myocardial infarction) (HCC) 10/03/2018   Aphthous ulcer of mouth 12/19/2017   HLD (hyperlipidemia) 12/12/2017  Microhematuria 06/05/2016   Vertigo 03/09/2016   Rhinitis, chronic 12/31/2015   Bilateral temporomandibular joint pain 12/31/2015   PAC (premature atrial contraction) 04/29/2015   History of colonic polyps 04/29/2015   Neck pain 02/11/2015   Degeneration of intervertebral disc of cervical spine without prolapsed disc 01/25/2015   Pain in the chest 04/15/2014   Lumbosacral radiculitis 04/07/2013   Thoracic radiculopathy 01/26/2013   Displacement of lumbar intervertebral disc without myelopathy 10/14/2012   Osteoporosis 02/27/2011   Lumbar disc disease 02/27/2011   Back pain 02/27/2011   Encounter for well adult exam with abnormal findings 02/24/2011   Presbyacusis 01/04/2010   LIVER FUNCTION TESTS, ABNORMAL, HX OF 10/29/2007   Essential (primary) hypertension 10/28/2007   Gastro-esophageal reflux 10/28/2007   DIVERTICULOSIS, COLON 10/28/2007   Past Medical History:  Diagnosis Date   Allergy    SEASONAL   Arthritis    CAD (coronary artery disease), native coronary artery    a. Cath 10/06/2018 - S/p DES to [facility]; medical therapy for high grade ostial/pro 2nd digonal    Cataracts, bilateral    immature   Chronic back pain    Coronary artery disease    DIVERTICULOSIS, COLON 10/28/2007   Dry skin    red patch on right knee area   Gallstones    GERD 10/28/2007   takes Prevacid daily   H/O hiatal hernia    HLD (hyperlipidemia) 12/12/2017   Hx of colonic polyps    HYPERLIPIDEMIA 10/28/2007   unable to take meds d/t allergies   HYPERTENSION 10/28/2007   takes Diovan daily   Internal hemorrhoids    Irritable bowel syndrome (IBS)    Joint pain    Joint swelling    LIVER FUNCTION TESTS, ABNORMAL, HX OF 10/29/2007   Lumbar disc disease    Myocardial infarction (HCC)     Obesity    OSTEOPENIA 10/29/2007   Osteoporosis    PONV (postoperative nausea and vomiting)    with 2013  neck surgery   Presbyacusis 01/04/2010   Rosacea    Seasonal allergies    takes Clarinex daily   Vertigo    hx of   Vitamin D deficiency    takes Vit D daily   Past Surgical History: Past Surgical History:  Procedure Laterality Date   ANTERIOR CERVICAL DECOMP/DISCECTOMY FUSION  08/02/2011   Procedure: ANTERIOR CERVICAL DECOMPRESSION/DISCECTOMY FUSION 3 LEVELS;  Surgeon: Mariam Dollar, MD;  Location: [facility];  Service: Neurosurgery;  Laterality: N/A;  Cervical Three-Four, Cervical Four-Five, Cervical Five-Six  Anterior Cervical Decompression Fusion    CARDIAC CATHETERIZATION     CHOLECYSTECTOMY N/A 01/25/2023   Procedure: LAPAROSCOPIC CHOLECYSTECTOMY;  Surgeon: Sheliah Hatch De Blanch, MD;  Location: [facility];  Service: General;  Laterality: N/A;   COLONOSCOPY  2001/2013   CORONARY STENT INTERVENTION N/A 10/06/2018   Procedure: CORONARY STENT INTERVENTION;  Surgeon: Runell Gess, MD;  Location: [facility];  Service: Cardiovascular;  Laterality: N/A;   ESOPHAGOGASTRODUODENOSCOPY     LEFT HEART CATH AND CORONARY ANGIOGRAPHY N/A 10/06/2018   Procedure: LEFT HEART CATH AND CORONARY ANGIOGRAPHY;  Surgeon: Runell Gess, MD;  Location: [facility];  Service: Cardiovascular;  Laterality: N/A;   POLYPECTOMY     TONSILLECTOMY     TONSILLECTOMY  at age 39   Medication List:  Current Outpatient Medications  Medication Sig Dispense Refill   aspirin 81 MG tablet Take 81 mg by mouth daily.     atorvastatin (LIPITOR) 10 MG tablet TAKE  1 TABLET EVERY DAY 90 tablet 3   Calcium Citrate (CITRACAL PO) Take 2 tablets by mouth daily.      Cholecalciferol (VITAMIN D) 2000 UNITS CAPS Take 2,000 Units by mouth daily.     clopidogrel (PLAVIX) 75 MG tablet TAKE 1 TABLET EVERY DAY 90 tablet 3   esomeprazole (NEXIUM) 40 MG capsule Take 1 capsule (40 mg total) by mouth in the morning  and at bedtime. 180 capsule 3   estradiol (ESTRACE) 0.1 MG/GM vaginal cream Place 1 Applicatorful vaginally every other day.     ezetimibe (ZETIA) 10 MG tablet TAKE 1 TABLET EVERY DAY 90 tablet 3   famotidine (PEPCID) 40 MG tablet TAKE 1 TABLET EVERY DAY 90 tablet 3   ibuprofen (ADVIL) 800 MG tablet Take 1 tablet (800 mg total) by mouth every 8 (eight) hours as needed. 30 tablet 0   metoprolol tartrate (LOPRESSOR) 25 MG tablet TAKE 1/2 TABLET TWICE DAILY 90 tablet 3   metroNIDAZOLE (FLAGYL) 500 MG tablet Take 1 tablet (500 mg total) by mouth 2 (two) times daily. (Patient not taking: Reported on 01/03/2023) 14 tablet 0   metroNIDAZOLE (METROGEL) 1 % gel Apply 1 Application topically daily.     Multiple Vitamin (MULITIVITAMIN WITH MINERALS) TABS Take 1 tablet by mouth daily.     NEXLETOL 180 MG TABS TAKE 1 TABLET EVERY DAY 90 tablet 3   nitroGLYCERIN (NITROSTAT) 0.4 MG SL tablet Place 1 tablet (0.4 mg total) under the tongue every 5 (five) minutes x 3 doses as needed for chest pain. 25 tablet 6   ondansetron (ZOFRAN) 4 MG tablet Take 1 tablet (4 mg total) by mouth every 8 (eight) hours as needed for nausea or vomiting. 20 tablet 0   ondansetron (ZOFRAN-ODT) 4 MG disintegrating tablet Take 1 tablet (4 mg total) by mouth every 6 (six) hours as needed for up to 20 doses for nausea or vomiting (Place under tongue). 20 tablet 0   oxyCODONE (OXY IR/ROXICODONE) 5 MG immediate release tablet Take 1 tablet (5 mg total) by mouth every 6 (six) hours as needed for severe pain (pain score 7-10). 12 tablet 0   oxyCODONE (ROXICODONE) 5 MG immediate release tablet Take 1 tablet (5 mg total) by mouth every 6 (six) hours as needed for severe pain. 12 tablet 0   pimecrolimus (ELIDEL) 1 % cream Apply 1 application topically every morning. 30 g 0   valsartan-hydrochlorothiazide (DIOVAN-HCT) 160-12.5 MG tablet TAKE 1 TABLET EVERY DAY 90 tablet 3   No current facility-administered medications for this visit.    Allergies: Allergies  Allergen Reactions   Penicillins Hives, Itching and Rash    Did it involve swelling of the face/tongue/throat, SOB, or low BP? Unknown Did it involve sudden or severe rash/hives, skin peeling, or any reaction on the inside of your mouth or nose? Unknown Did you need to seek medical attention at a hospital or doctor's office? Unknown When did it last happen?      pt cant recall If all above answers are "NO", may proceed with cephalosporin use.    Rosuvastatin Other (See Comments)    REACTION: muscle weakness   Simvastatin Other (See Comments)    REACTION: muscle weakness   Statins Other (See Comments)    Myalgias and weakness   Sulfonamide Derivatives Other (See Comments)    Nausea, vomiting, abd pain   Evolocumab Swelling and Other (See Comments)    Swelling, itchy eyes, mouth swelling, red/puffy/hot face   Misc. Sulfonamide Containing Compounds  Other (See Comments)   Other    Penicillin V Potassium Other (See Comments)   Brilinta [Ticagrelor] Rash    Itchy rash to torso   Doxycycline Itching and Rash   Doxycycline Hyclate Itching and Rash   Sulfa Antibiotics Other (See Comments)    Nausea, vomiting, abd pain   Social History: Social History   Socioeconomic History   Marital status: Divorced    Spouse name: Not on file   Number of children: 0   Years of education: 12   Highest education level: 12th grade  Occupational History   Not on file  Tobacco Use   Smoking status: Former    Types: Cigarettes   Smokeless tobacco: Never   Tobacco comments:    quit in 25 yrs ago  Vaping Use   Vaping status: Never Used  Substance and Sexual Activity   Alcohol use: Not Currently    Alcohol/week: 0.0 standard drinks of alcohol    Comment: rare   Drug use: No   Sexual activity: Yes    Birth control/protection: Post-menopausal  Other Topics Concern   Not on file  Social History Narrative   Not on file   Social Determinants of Health   Financial  Resource Strain: Low Risk  (11/19/2022)   Overall Financial Resource Strain (CARDIA)    Difficulty of Paying Living Expenses: Not hard at all  Food Insecurity: No Food Insecurity (11/19/2022)   Hunger Vital Sign    Worried About Running Out of Food in the Last Year: Never true    Ran Out of Food in the Last Year: Never true  Transportation Needs: No Transportation Needs (11/19/2022)   PRAPARE - Administrator, Civil Service (Medical): No    Lack of Transportation (Non-Medical): No  Physical Activity: Sufficiently Active (11/19/2022)   Exercise Vital Sign    Days of Exercise per Week: 3 days    Minutes of Exercise per Session: 60 min  Stress: No Stress Concern Present (11/19/2022)   [employer] of Occupational Health - Occupational Stress Questionnaire    Feeling of Stress : Not at all  Social Connections: Unknown (11/19/2022)   Social Connection and Isolation Panel [NHANES]    Frequency of Communication with Friends and Family: More than three times a week    Frequency of Social Gatherings with Friends and Family: More than three times a week    Attends Religious Services: Patient declined    Database administrator or Organizations: No    Attends Engineer, structural: Patient declined    Marital Status: Divorced  Recent Concern: Social Connections - Socially Isolated (11/18/2022)   Social Connection and Isolation Panel [NHANES]    Frequency of Communication with Friends and Family: More than three times a week    Frequency of Social Gatherings with Friends and Family: More than three times a week    Attends Religious Services: Never    Database administrator or Organizations: No    Attends Banker Meetings: Patient declined    Marital Status: Divorced   Lives in a ***. Smoking: *** Occupation: ***  Environmental HistorySurveyor, minerals in the house: Copywriter, advertising in the family room: {Blank  single:19197::"yes","no"} Carpet in the bedroom: {Blank single:19197::"yes","no"} Heating: {Blank single:19197::"electric","gas","heat pump"} Cooling: {Blank single:19197::"central","window","heat pump"} Pet: {Blank single:19197::"yes ***","no"}  Family History: Family History  Problem Relation Age of Onset   Atrial fibrillation Mother    Hyperlipidemia Mother    Stroke Mother  Heart disease Mother    Atrial fibrillation Father    Hypothyroidism Sister    Hypothyroidism Maternal Grandmother    Diabetes Maternal Grandmother    Anesthesia problems Neg Hx    Hypotension Neg Hx    Malignant hyperthermia Neg Hx    Pseudochol deficiency Neg Hx    Colon cancer Neg Hx    Stomach cancer Neg Hx    Rectal cancer Neg Hx    Esophageal cancer Neg Hx    Problem                               Relation Asthma                                   *** Eczema                                *** Food allergy                          *** Allergic rhino conjunctivitis     ***  Review of Systems  Constitutional:  Negative for appetite change, chills, fever and unexpected weight change.  HENT:  Negative for congestion and rhinorrhea.   Eyes:  Negative for itching.  Respiratory:  Negative for cough, chest tightness, shortness of breath and wheezing.   Cardiovascular:  Negative for chest pain.  Gastrointestinal:  Negative for abdominal pain.  Genitourinary:  Negative for difficulty urinating.  Skin:  Negative for rash.  Neurological:  Negative for headaches.    Objective: There were no vitals taken for this visit. There is no height or weight on file to calculate BMI. Physical Exam Vitals and nursing note reviewed.  Constitutional:      Appearance: Normal appearance. She is well-developed.  HENT:     Head: Normocephalic and atraumatic.     Right Ear: Tympanic membrane and external ear normal.     Left Ear: Tympanic membrane and external ear normal.     Nose: Nose normal.     Mouth/Throat:      Mouth: Mucous membranes are moist.     Pharynx: Oropharynx is clear.  Eyes:     Conjunctiva/sclera: Conjunctivae normal.  Cardiovascular:     Rate and Rhythm: Normal rate and regular rhythm.     Heart sounds: Normal heart sounds. No murmur heard.    No friction rub. No gallop.  Pulmonary:     Effort: Pulmonary effort is normal.     Breath sounds: Normal breath sounds. No wheezing, rhonchi or rales.  Musculoskeletal:     Cervical back: Neck supple.  Skin:    General: Skin is warm.     Findings: No rash.  Neurological:     Mental Status: She is alert and oriented to person, place, and time.  Psychiatric:        Behavior: Behavior normal.    The plan was reviewed with the patient/family, and all questions/concerned were addressed.  It was my pleasure to see Stepanie today and participate in her care. Please feel free to contact me with any questions or concerns.  Sincerely,  Wyline Mood, DO Allergy & Immunology  [facility] office: [phone] [facility] office: [phone]

## 2023-02-27 ENCOUNTER — Other Ambulatory Visit: Payer: Self-pay

## 2023-02-27 ENCOUNTER — Ambulatory Visit: Payer: Medicare Other | Admitting: Allergy

## 2023-02-27 ENCOUNTER — Encounter: Payer: Self-pay | Admitting: Allergy

## 2023-02-27 VITALS — BP 124/78 | HR 62 | Temp 98.2°F | Resp 18 | Ht 63.0 in | Wt 148.1 lb

## 2023-02-27 DIAGNOSIS — T783XXA Angioneurotic edema, initial encounter: Secondary | ICD-10-CM | POA: Diagnosis not present

## 2023-02-27 DIAGNOSIS — T7840XD Allergy, unspecified, subsequent encounter: Secondary | ICD-10-CM

## 2023-02-27 DIAGNOSIS — T7840XA Allergy, unspecified, initial encounter: Secondary | ICD-10-CM

## 2023-02-27 DIAGNOSIS — T783XXD Angioneurotic edema, subsequent encounter: Secondary | ICD-10-CM

## 2023-02-27 DIAGNOSIS — Z889 Allergy status to unspecified drugs, medicaments and biological substances status: Secondary | ICD-10-CM | POA: Diagnosis not present

## 2023-02-27 MED ORDER — EPINEPHRINE 0.3 MG/0.3ML IJ SOAJ: 0.3000 mg | INTRAMUSCULAR | 1 refills | Status: AC | PRN

## 2023-02-27 NOTE — Patient Instructions (Addendum)
Lip swelling Keep track of symptoms and write down what you had done that day. Stop Claritin.  Start zyrtec (cetirizine) 10mg  OR allegra (fexofenadine) 180mg  twice a day. If symptoms are not controlled or causes drowsiness let us know. Continue famotidine 40mg  daily as before.  Avoid the following potential triggers: alcohol, tight clothing, NSAIDs, hot showers and getting overheated. I have prescribed epinephrine injectable device and demonstrated proper use. For mild symptoms you can take over the counter antihistamines such as Benadryl 1-2 tablets = 25-50mg  and monitor symptoms closely. If symptoms worsen or if you have severe symptoms including breathing issues, throat closure, significant swelling, whole body hives, severe diarrhea and vomiting, lightheadedness then inject epinephrine and seek immediate medical care afterwards. Emergency action plan given.  Get bloodwork. We are ordering labs, so please allow 1-2 weeks for the results to come back. With the newly implemented Cures Act, the labs might be visible to you at the same time that they become visible to me. However, I will not address the results until all of the results are back, so please be patient.   Return in about 4 weeks (around 03/27/2023). Or sooner if needed.  Skin care recommendations  Bath time: Always use lukewarm water. AVOID very hot or cold water. Keep bathing time to 5-10 minutes. Do NOT use bubble bath. Use a mild soap and use just enough to wash the dirty areas. Do NOT scrub skin vigorously.  After bathing, pat dry your skin with a towel. Do NOT rub or scrub the skin.  Moisturizers and prescriptions:  ALWAYS apply moisturizers immediately after bathing (within 3 minutes). This helps to lock-in moisture. Use the moisturizer several times a day over the whole body. Good summer moisturizers include: Aveeno, CeraVe, Cetaphil. Good winter moisturizers include: Aquaphor, Vaseline, Cerave, Cetaphil, Eucerin,  Vanicream. When using moisturizers along with medications, the moisturizer should be applied about one hour after applying the medication to prevent diluting effect of the medication or moisturize around where you applied the medications. When not using medications, the moisturizer can be continued twice daily as maintenance.  Laundry and clothing: Avoid laundry products with added color or perfumes. Use unscented hypo-allergenic laundry products such as Tide free, Cheer free & gentle, and All free and clear.  If the skin still seems dry or sensitive, you can try double-rinsing the clothes. Avoid tight or scratchy clothing such as wool. Do not use fabric softeners or dyer sheets.

## 2023-03-06 LAB — C3 AND C4
Complement C3, Serum: 149 mg/dL (ref 82–167)
Complement C4, Serum: 26 mg/dL (ref 12–38)

## 2023-03-06 LAB — PROTEIN ELECTROPHORESIS, SERUM
A/G Ratio: 1.4 (ref 0.7–1.7)
Albumin ELP: 4.3 g/dL (ref 2.9–4.4)
Alpha 1: 0.3 g/dL (ref 0.0–0.4)
Alpha 2: 0.7 g/dL (ref 0.4–1.0)
Beta: 1.1 g/dL (ref 0.7–1.3)
Gamma Globulin: 1 g/dL (ref 0.4–1.8)
Globulin, Total: 3 g/dL (ref 2.2–3.9)

## 2023-03-06 LAB — PROTEIN ELECTROPHORESIS, URINE REFLEX
Albumin ELP, Urine: 100 %
Alpha-1-Globulin, U: 0 %
Alpha-2-Globulin, U: 0 %
Beta Globulin, U: 0 %
Gamma Globulin, U: 0 %
Protein, Ur: 10.1 mg/dL

## 2023-03-06 LAB — COMPREHENSIVE METABOLIC PANEL
ALT: 22 [IU]/L (ref 0–32)
AST: 29 [IU]/L (ref 0–40)
Albumin: 4.9 g/dL (ref 3.9–4.9)
Alkaline Phosphatase: 67 [IU]/L (ref 44–121)
BUN/Creatinine Ratio: 27 (ref 12–28)
BUN: 19 mg/dL (ref 8–27)
Bilirubin Total: 0.3 mg/dL (ref 0.0–1.2)
CO2: 27 mmol/L (ref 20–29)
Calcium: 9.9 mg/dL (ref 8.7–10.3)
Chloride: 100 mmol/L (ref 96–106)
Creatinine, Ser: 0.7 mg/dL (ref 0.57–1.00)
Globulin, Total: 2.4 g/dL (ref 1.5–4.5)
Glucose: 89 mg/dL (ref 70–99)
Potassium: 4.3 mmol/L (ref 3.5–5.2)
Sodium: 141 mmol/L (ref 134–144)
Total Protein: 7.3 g/dL (ref 6.0–8.5)
eGFR: 94 mL/min/{1.73_m2} (ref >59)

## 2023-03-06 LAB — CBC WITH DIFFERENTIAL/PLATELET
Basophils Absolute: 0 10*3/uL (ref 0.0–0.2)
Basos: 1 %
EOS (ABSOLUTE): 0.1 10*3/uL (ref 0.0–0.4)
Eos: 2 %
Hematocrit: 42.3 % (ref 34.0–46.6)
Hemoglobin: 14 g/dL (ref 11.1–15.9)
Immature Grans (Abs): 0 10*3/uL (ref 0.0–0.1)
Immature Granulocytes: 0 %
Lymphocytes Absolute: 1.2 10*3/uL (ref 0.7–3.1)
Lymphs: 33 %
MCH: 30.6 pg (ref 26.6–33.0)
MCHC: 33.1 g/dL (ref 31.5–35.7)
MCV: 93 fL (ref 79–97)
Monocytes Absolute: 0.3 10*3/uL (ref 0.1–0.9)
Monocytes: 9 %
Neutrophils Absolute: 2.1 10*3/uL (ref 1.4–7.0)
Neutrophils: 55 %
Platelets: 299 10*3/uL (ref 150–450)
RBC: 4.57 x10E6/uL (ref 3.77–5.28)
RDW: 11.6 % — ABNORMAL LOW (ref 11.7–15.4)
WBC: 3.7 10*3/uL (ref 3.4–10.8)

## 2023-03-06 LAB — TRYPTASE: Tryptase: 4.8 ug/L (ref 2.2–13.2)

## 2023-03-06 LAB — ALPHA-GAL PANEL: IgE (Immunoglobulin E), Serum: 71 [IU]/mL (ref 6–495)

## 2023-03-06 LAB — ANA W/REFLEX: Anti Nuclear Antibody (ANA): NEGATIVE

## 2023-03-06 LAB — C1 ESTERASE INHIBITOR: C1INH SerPl-mCnc: 28 mg/dL (ref 21–39)

## 2023-03-06 LAB — SEDIMENTATION RATE: Sed Rate: 4 mm/h (ref 0–40)

## 2023-03-06 LAB — C-REACTIVE PROTEIN: CRP: <1 mg/L (ref 0–10)

## 2023-03-06 LAB — C1 ESTERASE INHIBITOR, FUNCTIONAL: C1INH Functional/C1INH Total MFr SerPl: 96 %{normal}

## 2023-03-06 LAB — THYROID CASCADE PROFILE: TSH: 2.05 u[IU]/mL (ref 0.450–4.500)

## 2023-04-08 ENCOUNTER — Ambulatory Visit (INDEPENDENT_AMBULATORY_CARE_PROVIDER_SITE_OTHER): Payer: Medicare Other | Admitting: Internal Medicine

## 2023-04-08 ENCOUNTER — Encounter: Payer: Self-pay | Admitting: Internal Medicine

## 2023-04-08 VITALS — BP 126/78 | HR 70 | Temp 98.7°F | Ht 63.0 in | Wt 152.0 lb

## 2023-04-08 DIAGNOSIS — E782 Mixed hyperlipidemia: Secondary | ICD-10-CM

## 2023-04-08 DIAGNOSIS — E559 Vitamin D deficiency, unspecified: Secondary | ICD-10-CM | POA: Insufficient documentation

## 2023-04-08 DIAGNOSIS — J310 Chronic rhinitis: Secondary | ICD-10-CM

## 2023-04-08 DIAGNOSIS — I10 Essential (primary) hypertension: Secondary | ICD-10-CM

## 2023-04-08 DIAGNOSIS — M25511 Pain in right shoulder: Secondary | ICD-10-CM | POA: Insufficient documentation

## 2023-04-08 LAB — VITAMIN D 25 HYDROXY (VIT D DEFICIENCY, FRACTURES): VITD: 67.2 ng/mL (ref 30.00–100.00)

## 2023-04-08 MED ORDER — VALSARTAN-HYDROCHLOROTHIAZIDE 160-12.5 MG PO TABS: 1.0000 | ORAL_TABLET | Freq: Every day | ORAL | 3 refills | Status: DC

## 2023-04-08 MED ORDER — ATORVASTATIN CALCIUM 10 MG PO TABS: 10.0000 mg | ORAL_TABLET | Freq: Every day | ORAL | 3 refills | Status: DC

## 2023-04-08 MED ORDER — ESOMEPRAZOLE MAGNESIUM 40 MG PO CPDR: 40.0000 mg | DELAYED_RELEASE_CAPSULE | Freq: Two times a day (BID) | ORAL | 3 refills | Status: DC

## 2023-04-08 MED ORDER — FAMOTIDINE 40 MG PO TABS: 40.0000 mg | ORAL_TABLET | Freq: Every day | ORAL | 3 refills | Status: AC

## 2023-04-08 NOTE — Assessment & Plan Note (Signed)
 Mild chronic persistent, for otc allegra 180 every day prn

## 2023-04-08 NOTE — Assessment & Plan Note (Signed)
 Lab Results  Component Value Date   LDLCALC 71 08/14/2022   Uncontrolled,  pt to continue current zetia 10 every day, nexletol 180 every day, lipitor 10 every day and lower chol diet

## 2023-04-08 NOTE — Assessment & Plan Note (Signed)
 BP Readings from Last 3 Encounters:  04/08/23 126/78  02/27/23 124/78  01/26/23 117/61   Stable, pt to continue medical treatment lopressor 12.5 bid, diovan hct 160 12.5 qd

## 2023-04-08 NOTE — Progress Notes (Signed)
 Patient ID: Heather Merritt, female   DOB: April 27, 1955, 68 y.o.   MRN: [id number]         Chief Complaint:: yearly exam       HPI:  Heather Merritt is a 68 y.o. female here overall doing ok,  Pt denies chest pain, increased sob or doe, wheezing, orthopnea, PND, increased LE swelling, palpitations, dizziness or syncope.   Pt denies polydipsia, polyuria, or new focal neuro s/s.  Also s/p lap choly with postop lip angioedema a few days later to all food eaten or the anesthesia, saw allergist with gut histamine imbalance that likely was related, was temporary issue, should only be an issue for about 6 mo.    Wt Readings from Last 3 Encounters:  04/08/23 152 lb (68.9 kg)  02/27/23 148 lb 1.6 oz (67.2 kg)  01/25/23 145 lb (65.8 kg)   BP Readings from Last 3 Encounters:  04/08/23 126/78  02/27/23 124/78  01/26/23 117/61   Immunization History  Administered Date(s) Administered   Fluad Quad(high Dose 65+) 02/15/2020, 03/07/2022   Influenza Split 01/01/2011   Influenza, High Dose Seasonal PF 01/21/2019   Influenza, Seasonal, Injecte, Preservative Fre 03/01/2014   Influenza,inj,Quad PF,6+ Mos 04/14/2013   PFIZER(Purple Top)SARS-COV-2 Vaccination 04/26/2019, 05/18/2019, 03/10/2020   Pfizer Covid-19 Vaccine Bivalent Booster 62yrs & up 01/31/2021   Pneumococcal Polysaccharide-23 04/04/2022   Td 06/24/2003, 04/02/2004   Td (Adult), 2 Lf Tetanus Toxid, Preservative Free 06/24/2003   Tdap 06/05/2016   Zoster Recombinant(Shingrix) 12/22/2017, 04/02/2018, 07/05/2018   Health Maintenance Due  Topic Date Due   Diabetic kidney evaluation - Urine ACR  Never done   HEMOGLOBIN A1C  03/26/2023   Pneumonia Vaccine 46+ Years old (2 of 2 - PCV) 04/05/2023      Past Medical History:  Diagnosis Date   Allergy    SEASONAL   Angio-edema    Arthritis    CAD (coronary artery disease), native coronary artery    a. Cath 10/06/2018 - S/p DES to [facility]; medical therapy for high grade ostial/pro 2nd digonal     Cataracts, bilateral    immature   Chronic back pain    Coronary artery disease    DIVERTICULOSIS, COLON 10/28/2007   Dry skin    red patch on right knee area   Gallstones    GERD 10/28/2007   takes Prevacid  daily   H/O hiatal hernia    HLD (hyperlipidemia) 12/12/2017   Hx of colonic polyps    HYPERLIPIDEMIA 10/28/2007   unable to take meds d/t allergies   HYPERTENSION 10/28/2007   takes Diovan  daily   Internal hemorrhoids    Irritable bowel syndrome (IBS)    Joint pain    Joint swelling    LIVER FUNCTION TESTS, ABNORMAL, HX OF 10/29/2007   Lumbar disc disease    Myocardial infarction (HCC)    Obesity    OSTEOPENIA 10/29/2007   Osteoporosis    PONV (postoperative nausea and vomiting)    with 2013  neck surgery   Presbyacusis 01/04/2010   Rosacea    Seasonal allergies    takes Clarinex  daily   Vertigo    hx of   Vitamin D  deficiency    takes Vit D daily   Past Surgical History:  Procedure Laterality Date   ANTERIOR CERVICAL DECOMP/DISCECTOMY FUSION  08/02/2011   Procedure: ANTERIOR CERVICAL DECOMPRESSION/DISCECTOMY FUSION 3 LEVELS;  Surgeon: Arley SHAUNNA Helling, MD;  Location: [facility]UR[facility];  Service: Neurosurgery;  Laterality: N/A;  Cervical Three-Four, Cervical  Four-Five, Cervical Five-Six  Anterior Cervical Decompression Fusion    CARDIAC CATHETERIZATION     CHOLECYSTECTOMY N/A 01/25/2023   Procedure: LAPAROSCOPIC CHOLECYSTECTOMY;  Surgeon: Stevie Herlene Righter, MD;  Location: [facility] OR[facility];  Service: General;  Laterality: N/A;   COLONOSCOPY  2001/2013   CORONARY STENT INTERVENTION N/A 10/06/2018   Procedure: CORONARY STENT INTERVENTION;  Surgeon: Court Dorn PARAS, MD;  Location: [facility] INV[facility] CV [facility];  Service: Cardiovascular;  Laterality: N/A;   ESOPHAGOGASTRODUODENOSCOPY     LEFT HEART CATH AND CORONARY ANGIOGRAPHY N/A 10/06/2018   Procedure: LEFT HEART CATH AND CORONARY ANGIOGRAPHY;  Surgeon: Court Dorn PARAS, MD;  Location: [facility] CV [facility];  Service: Cardiovascular;   Laterality: N/A;   POLYPECTOMY     TONSILLECTOMY     TONSILLECTOMY  at age 26    reports that she has quit smoking. Her smoking use included cigarettes. She has never used smokeless tobacco. She reports that she does not currently use alcohol. She reports that she does not use drugs. family history includes Atrial fibrillation in her father and mother; Diabetes in her maternal grandmother; Heart disease in her mother; Hyperlipidemia in her mother; Hypothyroidism in her maternal grandmother and sister; Stroke in her mother. Allergies  Allergen Reactions   Penicillins Hives, Itching and Rash    Did it involve swelling of the face/tongue/throat, SOB, or low BP? Unknown Did it involve sudden or severe rash/hives, skin peeling, or any reaction on the inside of your mouth or nose? Unknown Did you need to seek medical attention at a hospital or doctor's office? Unknown When did it last happen?      pt cant recall If all above answers are "NO", may proceed with cephalosporin use.    Rosuvastatin Other (See Comments)    REACTION: muscle weakness   Simvastatin Other (See Comments)    REACTION: muscle weakness   Statins Other (See Comments)    Myalgias and weakness   Sulfonamide Derivatives Other (See Comments)    Nausea, vomiting, abd pain   Evolocumab  Swelling and Other (See Comments)    Swelling, itchy eyes, mouth swelling, red/puffy/hot face   Misc. Sulfonamide Containing Compounds Other (See Comments)   Other    Penicillin V Potassium Other (See Comments)   Brilinta  [Ticagrelor ] Rash    Itchy rash to torso   Doxycycline Itching and Rash   Doxycycline Hyclate Itching and Rash   Sulfa Antibiotics Other (See Comments)    Nausea, vomiting, abd pain   Current Outpatient Medications on File Prior to Visit  Medication Sig Dispense Refill   aspirin  81 MG tablet Take 81 mg by mouth daily.     Calcium  Citrate (CITRACAL PO) Take 2 tablets by mouth daily.      Cholecalciferol  (VITAMIN D ) 2000  UNITS CAPS Take 2,000 Units by mouth daily.     clopidogrel  (PLAVIX ) 75 MG tablet TAKE 1 TABLET EVERY DAY 90 tablet 3   EPINEPHrine  0.3 mg/0.3 mL IJ SOAJ injection Inject 0.3 mg into the muscle as needed for anaphylaxis. 2 each 1   estradiol (ESTRACE) 0.1 MG/GM vaginal cream Place 1 Applicatorful vaginally every other day.     ezetimibe  (ZETIA ) 10 MG tablet TAKE 1 TABLET EVERY DAY 90 tablet 3   metoprolol  tartrate (LOPRESSOR ) 25 MG tablet TAKE 1/2 TABLET TWICE DAILY 90 tablet 3   Multiple Vitamin (MULITIVITAMIN WITH MINERALS) TABS Take 1 tablet by mouth daily.     NEXLETOL  180 MG TABS TAKE 1 TABLET EVERY DAY 90 tablet 3  nitroGLYCERIN  (NITROSTAT ) 0.4 MG SL tablet Place 1 tablet (0.4 mg total) under the tongue every 5 (five) minutes x 3 doses as needed for chest pain. 25 tablet 6   pimecrolimus  (ELIDEL ) 1 % cream Apply 1 application topically every morning. 30 g 0   No current facility-administered medications on file prior to visit.        ROS:  All others reviewed and negative.  Objective        PE:  BP 126/78 (BP Location: Left Arm, Patient Position: Sitting, Cuff Size: Normal)   Pulse 70   Temp 98.7 F (37.1 C) (Oral)   Ht 5' 3 (1.6 m)   Wt 152 lb (68.9 kg)   SpO2 98%   BMI 26.93 kg/m                 Constitutional: Pt appears in NAD               HENT: Head: NCAT.                Right Ear: External ear normal.                 Left Ear: External ear normal.                Eyes: . Pupils are equal, round, and reactive to light. Conjunctivae and EOM are normal               Nose: without d/c or deformity               Neck: Neck supple. Gross normal ROM               Cardiovascular: Normal rate and regular rhythm.                 Pulmonary/Chest: Effort normal and breath sounds without rales or wheezing.                Abd:  Soft, NT, ND, + BS, no organomegaly               Neurological: Pt is alert. At baseline orientation, motor grossly intact               Skin: Skin is  warm. No rashes, no other new lesions, LE edema - none               Psychiatric: Pt behavior is normal without agitation   Micro: none  Cardiac tracings I have personally interpreted today:  none  Pertinent Radiological findings (summarize): none   Lab Results  Component Value Date   WBC 3.7 02/27/2023   HGB 14.0 02/27/2023   HCT 42.3 02/27/2023   PLT 299 02/27/2023   GLUCOSE 89 02/27/2023   CHOL 147 08/14/2022   TRIG 89 08/14/2022   HDL 59 08/14/2022   LDLDIRECT 177.0 06/05/2017   LDLCALC 71 08/14/2022   ALT 22 02/27/2023   AST 29 02/27/2023   NA 141 02/27/2023   K 4.3 02/27/2023   CL 100 02/27/2023   CREATININE 0.70 02/27/2023   BUN 19 02/27/2023   CO2 27 02/27/2023   TSH 2.050 02/27/2023   INR 0.9 10/03/2018   HGBA1C 5.5 09/24/2022   Assessment/Plan:  Heather Merritt is a 68 y.o. White or Caucasian [1] female with  has a past medical history of Allergy, Angio-edema, Arthritis, CAD (coronary artery disease), native coronary artery, Cataracts, bilateral, Chronic back pain, Coronary artery disease, DIVERTICULOSIS, COLON (10/28/2007), Dry  skin, Gallstones, GERD (10/28/2007), H/O hiatal hernia, HLD (hyperlipidemia) (12/12/2017), colonic polyps, HYPERLIPIDEMIA (10/28/2007), HYPERTENSION (10/28/2007), Internal hemorrhoids, Irritable bowel syndrome (IBS), Joint pain, Joint swelling, LIVER FUNCTION TESTS, ABNORMAL, HX OF (10/29/2007), Lumbar disc disease, Myocardial infarction (HCC), Obesity, OSTEOPENIA (10/29/2007), Osteoporosis, PONV (postoperative nausea and vomiting), Presbyacusis (01/04/2010), Rosacea, Seasonal allergies, Vertigo, and Vitamin D  deficiency.  Essential (primary) hypertension BP Readings from Last 3 Encounters:  04/08/23 126/78  02/27/23 124/78  01/26/23 117/61   Stable, pt to continue medical treatment lopressor  12.5 bid, diovan  hct 160 12.5 qd   HLD (hyperlipidemia) Lab Results  Component Value Date   LDLCALC 71 08/14/2022   Uncontrolled,  pt to  continue current zetia  10 every day, nexletol  180 every day, lipitor 10 every day and lower chol diet   Vitamin D  deficiency Last vitamin D  Lab Results  Component Value Date   VD25OH 67.20 04/08/2023   Stable, cont oral replacement   Rhinitis, chronic Mild chronic persistent, for otc allegra 180 every day prn  Followup: Return in about 1 year (around 04/07/2024).  Lynwood Rush, MD 04/08/2023 9:30 PM [facility]

## 2023-04-08 NOTE — Patient Instructions (Signed)

## 2023-04-08 NOTE — Progress Notes (Signed)
 The test results show that your current treatment is OK, as the tests are stable.  Please continue the same plan.  There is no other need for change of treatment or further evaluation based on these results, at this time.  thanks

## 2023-04-08 NOTE — Assessment & Plan Note (Signed)
 Last vitamin D Lab Results  Component Value Date   VD25OH 67.20 04/08/2023   Stable, cont oral replacement

## 2023-04-09 ENCOUNTER — Encounter: Payer: Self-pay | Admitting: Cardiovascular Disease

## 2023-04-09 ENCOUNTER — Ambulatory Visit: Payer: Medicare Other | Attending: Cardiovascular Disease | Admitting: Cardiovascular Disease

## 2023-04-09 VITALS — BP 126/64 | HR 70 | Ht 63.5 in | Wt 151.0 lb

## 2023-04-09 DIAGNOSIS — I10 Essential (primary) hypertension: Secondary | ICD-10-CM | POA: Diagnosis not present

## 2023-04-09 DIAGNOSIS — I214 Non-ST elevation (NSTEMI) myocardial infarction: Secondary | ICD-10-CM

## 2023-04-09 DIAGNOSIS — E782 Mixed hyperlipidemia: Secondary | ICD-10-CM

## 2023-04-09 MED ORDER — EZETIMIBE 10 MG PO TABS: 10.0000 mg | ORAL_TABLET | Freq: Every day | ORAL | 3 refills | Status: DC

## 2023-04-09 MED ORDER — CLOPIDOGREL BISULFATE 75 MG PO TABS: 75.0000 mg | ORAL_TABLET | Freq: Every day | ORAL | 3 refills | Status: DC

## 2023-04-09 MED ORDER — NEXLETOL 180 MG PO TABS: 1.0000 | ORAL_TABLET | Freq: Every day | ORAL | 3 refills | Status: DC

## 2023-04-09 MED ORDER — METOPROLOL TARTRATE 25 MG PO TABS: 12.5000 mg | ORAL_TABLET | Freq: Two times a day (BID) | ORAL | 3 refills | Status: DC

## 2023-04-09 MED ORDER — NITROGLYCERIN 0.4 MG SL SUBL: 0.4000 mg | SUBLINGUAL_TABLET | SUBLINGUAL | 6 refills | Status: AC | PRN

## 2023-04-09 NOTE — Assessment & Plan Note (Signed)
 History of hyperlipidemia on low-dose atorvastatin, Zetia and Nexletol.  Her most recent lipid profile performed 5//24 revealed total cholesterol 147, LDL of 71 and HDL 59.  She is followed by Dr. Rennis Golden in the lipid clinic.

## 2023-04-09 NOTE — Progress Notes (Signed)
 04/09/2023 Heather Merritt   1955-12-13  [id number]  Primary Physician Norleen Lynwood ORN, MD Primary Cardiologist: Dorn JINNY Lesches MD [facility] [place], [place]  HPI:  Heather Merritt is a 68 y.o.  moderately overweight divorced Caucasian female no children who works as a investment banker, operational.  I last saw her in the office 08/07/2022.  I first met her during her hospitalization in July 2020 for non-STEMI.  Her risk factors include hyperlipidemia intolerant to statin therapy, treated hypertension and family history with a mother who had PCI at age 16.  She is not a smoker.  She had chest pain on 10/03/2018 with positive enzymes.  Coronary CTA showed significant mid to distal dominant RCA stenosis.  I performed radial diagnostic cath on her 10/06/2018 revealing high-grade mid RCA stenosis.  I deployed a synergy 2.5 x 20 mm long drug-eluting stent post dilating with a 2.75 mm noncompliant balloon up to 2.8 mm.  She did have an ostial diagonal branch stenosis with normal LV function.  She was discharged home on 10/07/2018 on aspirin  and Brilinta .  Unfortunately she could not tolerate Brilinta  and was transitioned to clopidogrel .  She is also apparently statin intolerant and is seeing Dr. Mona for treatment of hyperlipidemia.  She is currently on low-dose atorvastatin  and Zetia  .  She apparently was tried on Repatha  but was allergic to it.   Since I saw her 6 months ago she continues to do well.  She is very active and works out in [employer] 3 times a week doing 30 minutes of cardio and 30 minutes of weightlifting.  She did recently have a laparoscopic cholecystectomy.  She denies chest pain or shortness of breath.   Current Meds  Medication Sig   aspirin  81 MG tablet Take 81 mg by mouth daily.   atorvastatin  (LIPITOR) 10 MG tablet Take 1 tablet (10 mg total) by mouth daily.   Calcium  Citrate (CITRACAL PO) Take 2 tablets by mouth daily.    Cholecalciferol  (VITAMIN D ) 2000 UNITS CAPS Take 2,000 Units by  mouth daily.   EPINEPHrine  0.3 mg/0.3 mL IJ SOAJ injection Inject 0.3 mg into the muscle as needed for anaphylaxis.   esomeprazole  (NEXIUM ) 40 MG capsule Take 1 capsule (40 mg total) by mouth in the morning and at bedtime.   estradiol (ESTRACE) 0.1 MG/GM vaginal cream Place 1 Applicatorful vaginally every other day.   famotidine  (PEPCID ) 40 MG tablet Take 1 tablet (40 mg total) by mouth daily.   Multiple Vitamin (MULITIVITAMIN WITH MINERALS) TABS Take 1 tablet by mouth daily.   pimecrolimus  (ELIDEL ) 1 % cream Apply 1 application topically every morning.   valsartan -hydrochlorothiazide  (DIOVAN -HCT) 160-12.5 MG tablet Take 1 tablet by mouth daily.   [DISCONTINUED] clopidogrel  (PLAVIX ) 75 MG tablet TAKE 1 TABLET EVERY DAY   [DISCONTINUED] ezetimibe  (ZETIA ) 10 MG tablet TAKE 1 TABLET EVERY DAY   [DISCONTINUED] metoprolol  tartrate (LOPRESSOR ) 25 MG tablet TAKE 1/2 TABLET TWICE DAILY   [DISCONTINUED] NEXLETOL  180 MG TABS TAKE 1 TABLET EVERY DAY   [DISCONTINUED] nitroGLYCERIN  (NITROSTAT ) 0.4 MG SL tablet Place 1 tablet (0.4 mg total) under the tongue every 5 (five) minutes x 3 doses as needed for chest pain.     Allergies  Allergen Reactions   Penicillins Hives, Itching and Rash    Did it involve swelling of the face/tongue/throat, SOB, or low BP? Unknown Did it involve sudden or severe rash/hives, skin peeling, or any reaction on the inside of your mouth or  nose? Unknown Did you need to seek medical attention at a hospital or doctor's office? Unknown When did it last happen?      pt cant recall If all above answers are "NO", may proceed with cephalosporin use.    Rosuvastatin Other (See Comments)    REACTION: muscle weakness   Simvastatin Other (See Comments)    REACTION: muscle weakness   Statins Other (See Comments)    Myalgias and weakness   Sulfonamide Derivatives Other (See Comments)    Nausea, vomiting, abd pain   Evolocumab  Swelling and Other (See Comments)    Swelling, itchy eyes,  mouth swelling, red/puffy/hot face   Misc. Sulfonamide Containing Compounds Other (See Comments)   Other    Penicillin V Potassium Other (See Comments)   Brilinta  [Ticagrelor ] Rash    Itchy rash to torso   Doxycycline Itching and Rash   Doxycycline Hyclate Itching and Rash   Sulfa Antibiotics Other (See Comments)    Nausea, vomiting, abd pain    Social History   Socioeconomic History   Marital status: Divorced    Spouse name: Not on file   Number of children: 0   Years of education: 12   Highest education level: 12th grade  Occupational History   Not on file  Tobacco Use   Smoking status: Former    Types: Cigarettes   Smokeless tobacco: Never   Tobacco comments:    quit in 25 yrs ago  Vaping Use   Vaping status: Never Used  Substance and Sexual Activity   Alcohol use: Not Currently    Alcohol/week: 0.0 standard drinks of alcohol    Comment: rare   Drug use: No   Sexual activity: Yes    Birth control/protection: Post-menopausal  Other Topics Concern   Not on file  Social History Narrative   Not on file   Social Drivers of Health   Financial Resource Strain: Low Risk  (04/07/2023)   Overall Financial Resource Strain (CARDIA)    Difficulty of Paying Living Expenses: Not hard at all  Food Insecurity: No Food Insecurity (04/07/2023)   Hunger Vital Sign    Worried About Running Out of Food in the Last Year: Never true    Ran Out of Food in the Last Year: Never true  Transportation Needs: No Transportation Needs (04/07/2023)   PRAPARE - Administrator, Civil Service (Medical): No    Lack of Transportation (Non-Medical): No  Physical Activity: Sufficiently Active (04/07/2023)   Exercise Vital Sign    Days of Exercise per Week: 5 days    Minutes of Exercise per Session: 60 min  Stress: No Stress Concern Present (04/07/2023)   [employer] of Occupational Health - Occupational Stress Questionnaire    Feeling of Stress : Not at all  Social Connections: Unknown  (04/07/2023)   Social Connection and Isolation Panel [NHANES]    Frequency of Communication with Friends and Family: More than three times a week    Frequency of Social Gatherings with Friends and Family: Three times a week    Attends Religious Services: Patient declined    Active Member of Clubs or Organizations: Yes    Attends Banker Meetings: More than 4 times per year    Marital Status: Divorced  Intimate Partner Violence: Not At Risk (11/19/2022)   Humiliation, Afraid, Rape, and Kick questionnaire    Fear of Current or Ex-Partner: No    Emotionally Abused: No    Physically Abused: No  Sexually Abused: No     Review of Systems: General: negative for chills, fever, night sweats or weight changes.  Cardiovascular: negative for chest pain, dyspnea on exertion, edema, orthopnea, palpitations, paroxysmal nocturnal dyspnea or shortness of breath Dermatological: negative for rash Respiratory: negative for cough or wheezing Urologic: negative for hematuria Abdominal: negative for nausea, vomiting, diarrhea, bright red blood per rectum, melena, or hematemesis Neurologic: negative for visual changes, syncope, or dizziness All other systems reviewed and are otherwise negative except as noted above.    Blood pressure 126/64, pulse 70, height 5' 3.5 (1.613 m), weight 151 lb (68.5 kg), SpO2 97%.  General appearance: alert and no distress Neck: no adenopathy, no carotid bruit, no JVD, supple, symmetrical, trachea midline, and thyroid  not enlarged, symmetric, no tenderness/mass/nodules Lungs: clear to auscultation bilaterally Heart: regular rate and rhythm, S1, S2 normal, no murmur, click, rub or gallop Extremities: extremities normal, atraumatic, no cyanosis or edema Pulses: 2+ and symmetric Skin: Skin color, texture, turgor normal. No rashes or lesions Neurologic: Grossly normal  EKG EKG Interpretation Date/Time:  Tuesday April 09 2023 11:24:02 EST Ventricular Rate:   70 PR Interval:  152 QRS Duration:  78 QT Interval:  420 QTC Calculation: 453 R Axis:   35  Text Interpretation: Normal sinus rhythm Low voltage QRS Cannot rule out Anterior infarct , age undetermined When compared with ECG of 06-Aug-2022 14:06, PREVIOUS ECG IS PRESENT Confirmed by Court Carrier [id number]) on 04/09/2023 11:34:25 AM    ASSESSMENT AND PLAN:   Essential (primary) hypertension History of essential hypertension blood pressure measured today at 126/64.  She is on valsartan , hydrochlorothiazide  and metoprolol .  HLD (hyperlipidemia) History of hyperlipidemia on low-dose atorvastatin , Zetia  and Nexletol .  Her most recent lipid profile performed 5//24 revealed total cholesterol 147, LDL of 71 and HDL 59.  She is followed by Dr. Mona in the lipid clinic.  NSTEMI (non-ST elevated myocardial infarction) (HCC) History of CAD status post non-STEMI 10/06/2018.  I catheterized her radially revealing a high-grade mid RCA stenosis which I stented with a 2.5 x 20 mm long Synergy drug-eluting stent postdilated to 2.8 mm.  She did have an ostial diagonal branch stenosis which we treated medically.     Carrier DOROTHA Court MD FACP,FACC,FAHA, [facility] 04/09/2023 11:34 AM

## 2023-04-09 NOTE — Patient Instructions (Signed)

## 2023-04-09 NOTE — Assessment & Plan Note (Signed)
 History of CAD status post non-STEMI 10/06/2018.  I catheterized her radially revealing a high-grade mid RCA stenosis which I stented with a 2.5 x 20 mm long Synergy drug-eluting stent postdilated to 2.8 mm.  She did have an ostial diagonal branch stenosis which we treated medically.

## 2023-04-09 NOTE — Assessment & Plan Note (Signed)
 History of essential hypertension blood pressure measured today at 126/64.  She is on valsartan, hydrochlorothiazide and metoprolol.

## 2023-04-09 NOTE — Progress Notes (Signed)
 Follow Up Note  RE: Heather Merritt MRN: [id number] DOB: 1956/02/19 Date of Office Visit: 04/10/2023  Referring provider: Norleen Lynwood ORN, MD Primary care provider: Norleen Lynwood ORN, MD  Chief Complaint: Angioedema (No concerns states that she is getting better)  History of Present Illness: I had the pleasure of seeing Heather Merritt for a follow up visit at the [facility] on 04/10/2023. She is a 68 y.o. female, who is being followed for angioedema and allergic reaction. Her previous allergy office visit was on 02/27/2023 with Dr. Luke. Today is a regular follow up visit.  Discussed the use of AI scribe software for clinical note transcription with the patient, who gave verbal consent to proceed.  The patient, with a history of episodes of lip and eyelid swelling, reports a significant improvement in symptoms since the last visit a month ago. She experienced only one or two mild episodes of lip swelling, which she associated with avocado consumption. The patient has since avoided avocado and has not experienced any further episodes. Previously, the patient reported that any food seemed to trigger the swelling, but this has resolved as she has been expanding her diet without any issues.   The patient has been managing the symptoms with a daily dose of Zyrtec 10mg  and has not needed to take Benadryl  at night. She also takes Famotidine  40mg  at bedtime for heartburn.   The patient also reports a recent cold, which has not progressed to the chest and is improving.     Assessment and Plan: Heather Merritt is a 68 y.o. female with: Angioedema, subsequent encounter Allergic reaction, subsequent encounter Past history - episodes of lip swelling and facial itching occurring after eating various foods, starting 5 days post-gallbladder surgery. No tongue swelling, throat tightness, or hives. Symptoms improve with Claritin  and Benadryl . No family history of similar symptoms. Not on ace-inhibitor.   Interim history - 2024 labs unremarkable. Episodes of lip and eyelid swelling have decreased in frequency and severity. Recent episodes associated with avocado consumption. Currently managed with Zyrtec 10mg  daily and Famotidine  40mg  at bedtime. Keep track of symptoms and write down what you had done that day. Avoid avocado. Declined bloodwork today. Continue zyrtec (cetirizine) 10mg  once a day.  May take twice a day if needed. In February try to wean to 5mg  once a day.  If you notice issues then okay to restart 10mg .  If symptoms are not controlled or causes drowsiness let us  know. Continue famotidine  40mg  daily as before.  Avoid the following potential triggers: alcohol, tight clothing, NSAIDs, hot showers and getting overheated. For mild symptoms you can take over the counter antihistamines such as Benadryl  1-2 tablets = 25-50mg  and monitor symptoms closely.  If symptoms worsen or if you have severe symptoms including breathing issues, throat closure, significant swelling, whole body hives, severe diarrhea and vomiting, lightheadedness then seek immediate medical care. Action plan in place.     Return in about 6 months (around 10/08/2023).  No orders of the defined types were placed in this encounter.  Lab Orders  No laboratory test(s) ordered today    Diagnostics: None.   Medication List:  Current Outpatient Medications  Medication Sig Dispense Refill   aspirin  81 MG tablet Take 81 mg by mouth daily.     atorvastatin  (LIPITOR) 10 MG tablet Take 1 tablet (10 mg total) by mouth daily. 90 tablet 3   Bempedoic Acid  (NEXLETOL ) 180 MG TABS Take 1 tablet (180 mg total) by  mouth daily. 90 tablet 3   Calcium  Citrate (CITRACAL PO) Take 2 tablets by mouth daily.      Cholecalciferol  (VITAMIN D ) 2000 UNITS CAPS Take 2,000 Units by mouth daily.     clopidogrel  (PLAVIX ) 75 MG tablet Take 1 tablet (75 mg total) by mouth daily. 90 tablet 3   EPINEPHrine  0.3 mg/0.3 mL IJ SOAJ injection Inject  0.3 mg into the muscle as needed for anaphylaxis. 2 each 1   esomeprazole  (NEXIUM ) 40 MG capsule Take 1 capsule (40 mg total) by mouth in the morning and at bedtime. 180 capsule 3   estradiol (ESTRACE) 0.1 MG/GM vaginal cream Place 1 Applicatorful vaginally every other day.     ezetimibe  (ZETIA ) 10 MG tablet Take 1 tablet (10 mg total) by mouth daily. 90 tablet 3   famotidine  (PEPCID ) 40 MG tablet Take 1 tablet (40 mg total) by mouth daily. 90 tablet 3   metoprolol  tartrate (LOPRESSOR ) 25 MG tablet Take 0.5 tablets (12.5 mg total) by mouth 2 (two) times daily. 90 tablet 3   Multiple Vitamin (MULITIVITAMIN WITH MINERALS) TABS Take 1 tablet by mouth daily.     nitroGLYCERIN  (NITROSTAT ) 0.4 MG SL tablet Place 1 tablet (0.4 mg total) under the tongue every 5 (five) minutes x 3 doses as needed for chest pain. 25 tablet 6   pimecrolimus  (ELIDEL ) 1 % cream Apply 1 application topically every morning. 30 g 0   valsartan -hydrochlorothiazide  (DIOVAN -HCT) 160-12.5 MG tablet Take 1 tablet by mouth daily. 90 tablet 3   No current facility-administered medications for this visit.   Allergies: Allergies  Allergen Reactions   Penicillins Hives, Itching and Rash    Did it involve swelling of the face/tongue/throat, SOB, or low BP? Unknown Did it involve sudden or severe rash/hives, skin peeling, or any reaction on the inside of your mouth or nose? Unknown Did you need to seek medical attention at a hospital or doctor's office? Unknown When did it last happen?      pt cant recall If all above answers are "NO", may proceed with cephalosporin use.    Rosuvastatin Other (See Comments)    REACTION: muscle weakness   Simvastatin Other (See Comments)    REACTION: muscle weakness   Statins Other (See Comments)    Myalgias and weakness   Sulfonamide Derivatives Other (See Comments)    Nausea, vomiting, abd pain   Evolocumab  Swelling and Other (See Comments)    Swelling, itchy eyes, mouth swelling,  red/puffy/hot face   Misc. Sulfonamide Containing Compounds Other (See Comments)   Other    Penicillin V Potassium Other (See Comments)   Brilinta  [Ticagrelor ] Rash    Itchy rash to torso   Doxycycline Itching and Rash   Doxycycline Hyclate Itching and Rash   Sulfa Antibiotics Other (See Comments)    Nausea, vomiting, abd pain   I reviewed her past medical history, social history, family history, and environmental history and no significant changes have been reported from her previous visit.  Review of Systems  Constitutional:  Negative for appetite change, chills, fever and unexpected weight change.  HENT:  Negative for congestion and rhinorrhea.        Lip swelling  Eyes:  Negative for itching.  Respiratory:  Negative for cough, chest tightness, shortness of breath and wheezing.   Cardiovascular:  Negative for chest pain.  Gastrointestinal:  Negative for abdominal pain.  Genitourinary:  Negative for difficulty urinating.  Skin:  Negative for rash.  Neurological:  Negative for headaches.  Objective: BP 120/72   Pulse 96   Temp 98.7 F (37.1 C)   Resp 16   Ht 5' 3.5 (1.613 m)   Wt 151 lb 14.4 oz (68.9 kg)   SpO2 96%   BMI 26.49 kg/m  Body mass index is 26.49 kg/m. Physical Exam Vitals and nursing note reviewed.  Constitutional:      Appearance: Normal appearance. She is well-developed.  HENT:     Head: Normocephalic and atraumatic.     Right Ear: Tympanic membrane and external ear normal.     Left Ear: Tympanic membrane and external ear normal.     Nose: Nose normal.     Mouth/Throat:     Mouth: Mucous membranes are moist.     Pharynx: Oropharynx is clear.  Eyes:     Conjunctiva/sclera: Conjunctivae normal.  Cardiovascular:     Rate and Rhythm: Normal rate and regular rhythm.     Heart sounds: Normal heart sounds. No murmur heard.    No friction rub. No gallop.  Pulmonary:     Effort: Pulmonary effort is normal.     Breath sounds: Normal breath sounds. No  wheezing, rhonchi or rales.  Musculoskeletal:     Cervical back: Neck supple.  Skin:    General: Skin is warm.     Findings: No rash.  Neurological:     Mental Status: She is alert and oriented to person, place, and time.  Psychiatric:        Behavior: Behavior normal.    Previous notes and tests were reviewed. The plan was reviewed with the patient/family, and all questions/concerned were addressed.  It was my pleasure to see Heather Merritt today and participate in her care. Please feel free to contact me with any questions or concerns.  Sincerely,  Orlan Cramp, DO Allergy & Immunology  [facility]  [place] office: [phone] [facility] office: [phone]

## 2023-04-10 ENCOUNTER — Encounter: Payer: Self-pay | Admitting: Allergy

## 2023-04-10 ENCOUNTER — Other Ambulatory Visit: Payer: Self-pay

## 2023-04-10 ENCOUNTER — Ambulatory Visit: Payer: Medicare Other | Admitting: Allergy

## 2023-04-10 VITALS — BP 120/72 | HR 96 | Temp 98.7°F | Resp 16 | Ht 63.5 in | Wt 151.9 lb

## 2023-04-10 DIAGNOSIS — T781XXD Other adverse food reactions, not elsewhere classified, subsequent encounter: Secondary | ICD-10-CM | POA: Diagnosis not present

## 2023-04-10 DIAGNOSIS — T783XXD Angioneurotic edema, subsequent encounter: Secondary | ICD-10-CM

## 2023-04-10 NOTE — Patient Instructions (Addendum)
 Lip swelling Keep track of symptoms and write down what you had done that day. Avoid avocado. Continue zyrtec (cetirizine) 10mg  once a day.  May take twice a day if needed. In February try to wean to 5mg  once a day.  If you notice issues then okay to restart 10mg .  If symptoms are not controlled or causes drowsiness let us  know. Continue famotidine  40mg  daily as before.  Avoid the following potential triggers: alcohol, tight clothing, NSAIDs, hot showers and getting overheated. For mild symptoms you can take over the counter antihistamines such as Benadryl  1-2 tablets = 25-50mg  and monitor symptoms closely.  If symptoms worsen or if you have severe symptoms including breathing issues, throat closure, significant swelling, whole body hives, severe diarrhea and vomiting, lightheadedness then seek immediate medical care. Action plan in place.   Return in about 6 months (around 10/08/2023). Or sooner if needed.

## 2023-04-16 ENCOUNTER — Encounter: Payer: Self-pay | Admitting: Internal Medicine

## 2023-04-17 ENCOUNTER — Other Ambulatory Visit (HOSPITAL_COMMUNITY): Payer: Self-pay

## 2023-04-18 NOTE — Telephone Encounter (Unsigned)
Copied from CRM [id number]. Topic: Clinical - Prescription Issue >> Apr 18, 2023 11:24 AM Elizebeth Brooking wrote: Reason for CRM: [facility] called regarding the prescription  esomeprazole (NEXIUM) 40 MG capsule due to drug on drug enter action , Reference Number [id number] and Callback Number [id number]

## 2023-04-19 ENCOUNTER — Encounter: Payer: Self-pay | Admitting: Internal Medicine

## 2023-04-19 ENCOUNTER — Other Ambulatory Visit (HOSPITAL_COMMUNITY): Payer: Self-pay

## 2023-04-19 ENCOUNTER — Telehealth: Payer: Self-pay | Admitting: Pharmacy Technician

## 2023-04-19 NOTE — Telephone Encounter (Signed)
Pharmacy Patient Advocate Encounter   Received notification from Patient Advice Request messages that prior authorization for nexletol is required/requested.   Insurance verification completed.   The patient is insured through [employer] .   Per test claim: PA required; PA submitted to above mentioned insurance via CoverMyMeds Key/confirmation #/EOC [id number] Status is pending

## 2023-04-22 ENCOUNTER — Other Ambulatory Visit (HOSPITAL_COMMUNITY): Payer: Self-pay

## 2023-04-22 NOTE — Telephone Encounter (Signed)
Pharmacy Patient Advocate Encounter  Received notification from [employer] that Prior Authorization for nexletol has been APPROVED from 04/03/23 to 04/18/24   PA #/Case ID/Reference #: [id number]

## 2023-05-21 ENCOUNTER — Telehealth: Payer: Self-pay

## 2023-05-21 NOTE — Telephone Encounter (Signed)
I would try OTC meds such as delsym for cough; also for Mucinex (or it's generic off brand) for congestion, and tylenol as needed for pain, but may need ROV if has worsening fever, sob or wheezing or other unusual symptoms

## 2023-05-21 NOTE — Telephone Encounter (Signed)
Copied from CRM [id number]. Topic: Clinical - Medical Advice >> May 21, 2023 12:08 PM Heather Merritt wrote: Reason for CRM: Patient isnt feeling good, congestion, did take a at home covid test and that was negative, stated she has had a fever on and off, no appointments in the office for today, patient would like for someone to call her back on what she should do

## 2023-05-22 NOTE — Telephone Encounter (Signed)
Called and let Pt know, she states understanding.

## 2023-05-23 ENCOUNTER — Ambulatory Visit: Payer: Medicare Other | Admitting: Internal Medicine

## 2023-05-24 ENCOUNTER — Encounter: Payer: Self-pay | Admitting: Internal Medicine

## 2023-05-24 ENCOUNTER — Ambulatory Visit: Payer: Medicare Other | Admitting: Internal Medicine

## 2023-05-24 VITALS — BP 120/74 | HR 80 | Temp 98.2°F | Ht 63.5 in | Wt 148.0 lb

## 2023-05-24 DIAGNOSIS — K219 Gastro-esophageal reflux disease without esophagitis: Secondary | ICD-10-CM

## 2023-05-24 DIAGNOSIS — E559 Vitamin D deficiency, unspecified: Secondary | ICD-10-CM | POA: Diagnosis not present

## 2023-05-24 DIAGNOSIS — I10 Essential (primary) hypertension: Secondary | ICD-10-CM

## 2023-05-24 DIAGNOSIS — J101 Influenza due to other identified influenza virus with other respiratory manifestations: Secondary | ICD-10-CM

## 2023-05-24 LAB — POCT INFLUENZA A/B
Influenza A, POC: POSITIVE — AB
Influenza B, POC: NEGATIVE

## 2023-05-24 LAB — POC COVID19 BINAXNOW: SARS Coronavirus 2 Ag: NEGATIVE

## 2023-05-24 MED ORDER — HYDROCODONE BIT-HOMATROP MBR 5-1.5 MG/5ML PO SOLN: 5.0000 mL | Freq: Four times a day (QID) | ORAL | 0 refills | Status: AC | PRN

## 2023-05-24 MED ORDER — OSELTAMIVIR PHOSPHATE 75 MG PO CAPS
75.0000 mg | ORAL_CAPSULE | Freq: Two times a day (BID) | ORAL | 0 refills | Status: AC
Start: 2023-05-24 — End: 2023-05-29

## 2023-05-24 NOTE — Patient Instructions (Addendum)
Please take all new medication as prescribed - the tamiflu, cough medicine  You can also take Delsym OTC for cough, and/or Mucinex (or it's generic off brand) for congestion, and tylenol as needed for pain.  Please continue all other medications as before, and refills have been done if requested.  Please have the pharmacy call with any other refills you may need.  Please keep your appointments with your specialists as you may have planned

## 2023-05-24 NOTE — Progress Notes (Signed)
 Patient ID: Heather Merritt, female   DOB: 05-01-1955, 68 y.o.   MRN: [id number]        Chief Complaint: follow up flu like symptoms x 3 days, htn, low vit d, gerd       HPI:  Heather Merritt is a 68 y.o. female here with ha, fever, cough and congestion x 3 days but Pt denies chest pain, increased sob or doe, wheezing, orthopnea, PND, increased LE swelling, palpitations, dizziness or syncope.   Pt denies polydipsia, polyuria, or new focal neuro s/s.    Pt denies fever, wt loss, night sweats, loss of appetite, or other constitutional symptoms  Denies worsening reflux, abd pain, dysphagia, n/v, bowel change or blood.       Wt Readings from Last 3 Encounters:  05/24/23 148 lb (67.1 kg)  04/10/23 151 lb 14.4 oz (68.9 kg)  04/09/23 151 lb (68.5 kg)   BP Readings from Last 3 Encounters:  05/24/23 120/74  04/10/23 120/72  04/09/23 126/64         Past Medical History:  Diagnosis Date   Allergy    SEASONAL   Angio-edema    Arthritis    CAD (coronary artery disease), native coronary artery    a. Cath 10/06/2018 - S/p DES to [facility]; medical therapy for high grade ostial/pro 2nd digonal    Cataracts, bilateral    immature   Chronic back pain    Coronary artery disease    DIVERTICULOSIS, COLON 10/28/2007   Dry skin    red patch on right knee area   Gallstones    GERD 10/28/2007   takes Prevacid daily   H/O hiatal hernia    HLD (hyperlipidemia) 12/12/2017   Hx of colonic polyps    HYPERLIPIDEMIA 10/28/2007   unable to take meds d/t allergies   HYPERTENSION 10/28/2007   takes Diovan daily   Internal hemorrhoids    Irritable bowel syndrome (IBS)    Joint pain    Joint swelling    LIVER FUNCTION TESTS, ABNORMAL, HX OF 10/29/2007   Lumbar disc disease    Myocardial infarction (HCC)    Obesity    OSTEOPENIA 10/29/2007   Osteoporosis    PONV (postoperative nausea and vomiting)    with 2013  neck surgery   Presbyacusis 01/04/2010   Rosacea    Seasonal allergies    takes Clarinex  daily   Vertigo    hx of   Vitamin D deficiency    takes Vit D daily   Past Surgical History:  Procedure Laterality Date   ANTERIOR CERVICAL DECOMP/DISCECTOMY FUSION  08/02/2011   Procedure: ANTERIOR CERVICAL DECOMPRESSION/DISCECTOMY FUSION 3 LEVELS;  Surgeon: Mariam Dollar, MD;  Location: [facility] NEURO ORS;  Service: Neurosurgery;  Laterality: N/A;  Cervical Three-Four, Cervical Four-Five, Cervical Five-Six  Anterior Cervical Decompression Fusion    CARDIAC CATHETERIZATION     CHOLECYSTECTOMY N/A 01/25/2023   Procedure: LAPAROSCOPIC CHOLECYSTECTOMY;  Surgeon: Sheliah Hatch De Blanch, MD;  Location: [facility] OR[facility];  Service: General;  Laterality: N/A;   COLONOSCOPY  2001/2013   CORONARY STENT INTERVENTION N/A 10/06/2018   Procedure: CORONARY STENT INTERVENTION;  Surgeon: Runell Gess, MD;  Location: [facility] INVASIVE CV LAB;  Service: Cardiovascular;  Laterality: N/A;   ESOPHAGOGASTRODUODENOSCOPY     LEFT HEART CATH AND CORONARY ANGIOGRAPHY N/A 10/06/2018   Procedure: LEFT HEART CATH AND CORONARY ANGIOGRAPHY;  Surgeon: Runell Gess, MD;  Location: [facility] INVASIVE CV LAB;  Service: Cardiovascular;  Laterality: N/A;   POLYPECTOMY  TONSILLECTOMY     TONSILLECTOMY  at age 73    reports that she has quit smoking. Her smoking use included cigarettes. She has never used smokeless tobacco. She reports that she does not currently use alcohol. She reports that she does not use drugs. family history includes Atrial fibrillation in her father and mother; Diabetes in her maternal grandmother; Heart disease in her mother; Hyperlipidemia in her mother; Hypothyroidism in her maternal grandmother and sister; Stroke in her mother. Allergies  Allergen Reactions   Penicillins Hives, Itching and Rash    Did it involve swelling of the face/tongue/throat, SOB, or low BP? Unknown Did it involve sudden or severe rash/hives, skin peeling, or any reaction on the inside of your mouth or nose? Unknown Did you need to seek medical  attention at a hospital or doctor's office? Unknown When did it last happen?      pt cant recall If all above answers are "NO", may proceed with cephalosporin use.    Heather Merritt Other (See Comments)    REACTION: muscle weakness   Simvastatin Other (See Comments)    REACTION: muscle weakness   Statins Other (See Comments)    Myalgias and weakness   Sulfonamide Derivatives Other (See Comments)    Nausea, vomiting, abd pain   Evolocumab Swelling and Other (See Comments)    Swelling, itchy eyes, mouth swelling, red/puffy/hot face   Misc. Sulfonamide Containing Compounds Other (See Comments)   Other    Penicillin V Potassium Other (See Comments)   Brilinta [Ticagrelor] Rash    Itchy rash to torso   Doxycycline Itching and Rash   Doxycycline Hyclate Itching and Rash   Sulfa Antibiotics Other (See Comments)    Nausea, vomiting, abd pain   Current Outpatient Medications on File Prior to Visit  Medication Sig Dispense Refill   aspirin 81 MG tablet Take 81 mg by mouth daily.     atorvastatin (LIPITOR) 10 MG tablet Take 1 tablet (10 mg total) by mouth daily. 90 tablet 3   Bempedoic Acid (NEXLETOL) 180 MG TABS Take 1 tablet (180 mg total) by mouth daily. 90 tablet 3   Calcium Citrate (CITRACAL PO) Take 2 tablets by mouth daily.      Cholecalciferol (VITAMIN D) 2000 UNITS CAPS Take 2,000 Units by mouth daily.     clopidogrel (PLAVIX) 75 MG tablet Take 1 tablet (75 mg total) by mouth daily. 90 tablet 3   EPINEPHrine 0.3 mg/0.3 mL IJ SOAJ injection Inject 0.3 mg into the muscle as needed for anaphylaxis. 2 each 1   esomeprazole (NEXIUM) 40 MG capsule Take 1 capsule (40 mg total) by mouth in the morning and at bedtime. 180 capsule 3   estradiol (ESTRACE) 0.1 MG/GM vaginal cream Place 1 Applicatorful vaginally every other day.     ezetimibe (ZETIA) 10 MG tablet Take 1 tablet (10 mg total) by mouth daily. 90 tablet 3   famotidine (PEPCID) 40 MG tablet Take 1 tablet (40 mg total) by mouth daily.  90 tablet 3   metoprolol tartrate (LOPRESSOR) 25 MG tablet Take 0.5 tablets (12.5 mg total) by mouth 2 (two) times daily. 90 tablet 3   Multiple Vitamin (MULITIVITAMIN WITH MINERALS) TABS Take 1 tablet by mouth daily.     nitroGLYCERIN (NITROSTAT) 0.4 MG SL tablet Place 1 tablet (0.4 mg total) under the tongue every 5 (five) minutes x 3 doses as needed for chest pain. 25 tablet 6   pimecrolimus (ELIDEL) 1 % cream Apply 1 application topically every  morning. 30 g 0   valsartan-hydrochlorothiazide (DIOVAN-HCT) 160-12.5 MG tablet Take 1 tablet by mouth daily. 90 tablet 3   No current facility-administered medications on file prior to visit.        ROS:  All others reviewed and negative.  Objective        PE:  BP 120/74 (BP Location: Left Arm, Patient Position: Sitting, Cuff Size: Normal)   Pulse 80   Temp 98.2 F (36.8 C) (Oral)   Ht 5' 3.5" (1.613 m)   Wt 148 lb (67.1 kg)   SpO2 98%   BMI 25.81 kg/m                 Constitutional: Pt appears mild ill               HENT: Head: NCAT.                Right Ear: External ear normal.                 Left Ear: External ear normal. Bilat tm's with mild erythema.  Max sinus areas non tender.  Pharynx with mild erythema, no exudate               Eyes: . Pupils are equal, round, and reactive to light. Conjunctivae and EOM are normal               Nose: without d/c or deformity               Neck: Neck supple. Gross normal ROM               Cardiovascular: Normal rate and regular rhythm.                 Pulmonary/Chest: Effort normal and breath sounds without rales or wheezing.                Abd:  Soft, NT, ND, + BS, no organomegaly               Neurological: Pt is alert. At baseline orientation, motor grossly intact               Skin: Skin is warm. No rashes, no other new lesions, LE edema - none               Psychiatric: Pt behavior is normal without agitation   Micro: none  Cardiac tracings I have personally interpreted today:   none  Pertinent Radiological findings (summarize): none   Lab Results  Component Value Date   WBC 3.7 02/27/2023   HGB 14.0 02/27/2023   HCT 42.3 02/27/2023   PLT 299 02/27/2023   GLUCOSE 89 02/27/2023   CHOL 147 08/14/2022   TRIG 89 08/14/2022   HDL 59 08/14/2022   LDLDIRECT 177.0 06/05/2017   LDLCALC 71 08/14/2022   ALT 22 02/27/2023   AST 29 02/27/2023   NA 141 02/27/2023   K 4.3 02/27/2023   CL 100 02/27/2023   CREATININE 0.70 02/27/2023   BUN 19 02/27/2023   CO2 27 02/27/2023   TSH 2.050 02/27/2023   INR 0.9 10/03/2018   HGBA1C 5.5 09/24/2022   POCT - Flu A - Positive  Assessment/Plan:  ROGER FASNACHT is a 68 y.o. White or Caucasian [1] female with  has a past medical history of Allergy, Angio-edema, Arthritis, CAD (coronary artery disease), native coronary artery, Cataracts, bilateral, Chronic back pain, Coronary artery disease, DIVERTICULOSIS, COLON (10/28/2007), Dry skin, Gallstones,  GERD (10/28/2007), H/O hiatal hernia, HLD (hyperlipidemia) (12/12/2017), colonic polyps, HYPERLIPIDEMIA (10/28/2007), HYPERTENSION (10/28/2007), Internal hemorrhoids, Irritable bowel syndrome (IBS), Joint pain, Joint swelling, LIVER FUNCTION TESTS, ABNORMAL, HX OF (10/29/2007), Lumbar disc disease, Myocardial infarction (HCC), Obesity, OSTEOPENIA (10/29/2007), Osteoporosis, PONV (postoperative nausea and vomiting), Presbyacusis (01/04/2010), Rosacea, Seasonal allergies, Vertigo, and Vitamin D deficiency.  Influenza A Acute onset, for rest, fluids, tylenol, also tamiflu 75 bid x 5 days, cough med prn, and mucinex bid prn  Vitamin D deficiency Last vitamin D Lab Results  Component Value Date   VD25OH 67.20 04/08/2023   Stable, cont oral replacement   Essential (primary) hypertension BP Readings from Last 3 Encounters:  05/24/23 120/74  04/10/23 120/72  04/09/23 126/64   Stable, pt to continue medical treatment lopressor 12.5 bid, diovan hct 160 12.5 qd   Gastro-esophageal  reflux Stable overall, cont nexium 40 mg qd  Followup: Return if symptoms worsen or fail to improve.  Oliver Barre, MD 05/26/2023 3:51 PM [facility]

## 2023-05-26 ENCOUNTER — Encounter: Payer: Self-pay | Admitting: Internal Medicine

## 2023-05-26 DIAGNOSIS — J101 Influenza due to other identified influenza virus with other respiratory manifestations: Secondary | ICD-10-CM | POA: Insufficient documentation

## 2023-05-26 NOTE — Assessment & Plan Note (Signed)
 Stable overall, cont nexium 40 mg qd

## 2023-05-26 NOTE — Assessment & Plan Note (Signed)
 Acute onset, for rest, fluids, tylenol, also tamiflu 75 bid x 5 days, cough med prn, and mucinex bid prn

## 2023-05-26 NOTE — Assessment & Plan Note (Signed)
 Last vitamin D Lab Results  Component Value Date   VD25OH 67.20 04/08/2023   Stable, cont oral replacement

## 2023-05-26 NOTE — Assessment & Plan Note (Signed)
 BP Readings from Last 3 Encounters:  05/24/23 120/74  04/10/23 120/72  04/09/23 126/64   Stable, pt to continue medical treatment lopressor 12.5 bid, diovan hct 160 12.5 qd

## 2023-07-16 ENCOUNTER — Other Ambulatory Visit: Payer: Self-pay | Admitting: *Deleted

## 2023-07-16 DIAGNOSIS — I2510 Atherosclerotic heart disease of native coronary artery without angina pectoris: Secondary | ICD-10-CM

## 2023-07-16 DIAGNOSIS — E785 Hyperlipidemia, unspecified: Secondary | ICD-10-CM

## 2023-08-23 ENCOUNTER — Encounter (HOSPITAL_BASED_OUTPATIENT_CLINIC_OR_DEPARTMENT_OTHER): Admitting: Internal Medicine

## 2023-09-02 ENCOUNTER — Encounter: Payer: Self-pay | Admitting: Internal Medicine

## 2023-09-02 LAB — HM MAMMOGRAPHY

## 2023-09-04 ENCOUNTER — Other Ambulatory Visit (HOSPITAL_COMMUNITY): Payer: Self-pay | Admitting: Internal Medicine

## 2023-09-05 ENCOUNTER — Ambulatory Visit: Payer: Self-pay | Admitting: Internal Medicine

## 2023-09-05 LAB — LIPID PANEL
Chol/HDL Ratio: 2.4 ratio (ref 0.0–4.4)
Cholesterol, Total: 124 mg/dL (ref 100–199)
HDL: 52 mg/dL (ref >39)
LDL Chol Calc (NIH): 55 mg/dL (ref 0–99)
Triglycerides: 91 mg/dL (ref 0–149)
VLDL Cholesterol Cal: 17 mg/dL (ref 5–40)

## 2023-09-10 ENCOUNTER — Other Ambulatory Visit (HOSPITAL_BASED_OUTPATIENT_CLINIC_OR_DEPARTMENT_OTHER): Payer: Self-pay | Admitting: *Deleted

## 2023-09-17 NOTE — Progress Notes (Unsigned)
  Cardiology Office Note   Date:  09/18/2023  ID:  Heather Merritt, DOB 1956/01/26, MRN [id number] PCP: Roslyn Coombe, MD  [facility] Providers Cardiologist:  Lauro Portal, MD { Click to update primary MD,subspecialty MD or APP then REFRESH:1}    PMH Coronary artery disease LHC 10/06/2018 2nd diag lesion 90% (fairly small vessel >> Rx) Prox RCA to mid RCA 95% Mid RCA 30% DES 2.5 x 20 Synergy to mid RCA Dyslipidemia Hypertension Statin intolerance Allergic to Repatha   Referred to Advanced Lipid Disorders clinic and seen by Dr. Maximo Spar 05/08/2019.  History of NSTEMI with subsequent cardiac cath July 2020 which revealed RCA disease requiring DES.  She completed cardiac rehab.  Longstanding history of dyslipidemia with an intolerance to atorvastatin , rosuvastatin, simvastatin, pravastatin and others with significant myalgias particularly at higher doses. At the time of NSTEMI she was taking atorvastatin  10 mg every other day, but increased to daily following MI. At the time of the initial visit, total cholesterol was 204, triglycerides 280, HDL 36 and LDL 112. Zetia  was added to regimen and she had improvement in LDL from 113 to 81. Triglycerides remained elevated at 167. Consideration given to starting Vascepa .   At follow-up visit 09/2019, she reported Vascepa  was cost prohibitive. Repatha  was started and she had significant improvement in lipids with triglycerides down to 111 and LDL to 17. Unfortunately, she developed throat tightening and facial flushing that improved with antihistamine so it cannot be resumed.   July 2022, LDL 100, Nexletol  was added.  November 2022, initial GI upset with Nexletol  but after a holiday she resumed with no side effects. She continued on atorvastatin  10 mg daily, ezetimibe  10 mg daily, and Nexletol  180 mg daily. Total cholesterol 117, triglycerides 139, HDL 42 and LDL 51, down from 100.   May 2024, in ER with pain between her shoulder blades thought to  be reflux. MI ruled out, referred to GI. Lipids: TC 147, trigs 89, HDL 59, LDL 71.    History of Present Illness Heather Merritt is a 68 y.o. female ***  ROS: ***  Studies Reviewed      ***  No results found for: LIPOA  Risk Assessment/Calculations {Does this patient have ATRIAL FIBRILLATION?:[id number]}         Physical Exam VS:  BP 138/78   Pulse 74   Ht 5' 3.5 (1.613 m)   Wt 148 lb (67.1 kg)   SpO2 97%   BMI 25.81 kg/m    Wt Readings from Last 3 Encounters:  09/18/23 148 lb (67.1 kg)  05/24/23 148 lb (67.1 kg)  04/10/23 151 lb 14.4 oz (68.9 kg)    GEN: Well nourished, well developed in no acute distress NECK: No JVD; No carotid bruits CARDIAC: ***RRR, no murmurs, rubs, gallops RESPIRATORY:  Clear to auscultation without rales, wheezing or rhonchi  ABDOMEN: Soft, non-tender, non-distended EXTREMITIES:  No edema; No deformity   ASSESSMENT AND PLAN ***    {Are you ordering a CV Procedure (e.g. stress test, cath, DCCV, TEE, etc)?   Press F2        :[id number]}  Dispo: ***  Signed, Slater Duncan, NP-C

## 2023-09-18 ENCOUNTER — Ambulatory Visit (HOSPITAL_BASED_OUTPATIENT_CLINIC_OR_DEPARTMENT_OTHER): Admitting: Nurse Practitioner

## 2023-09-18 ENCOUNTER — Encounter (HOSPITAL_BASED_OUTPATIENT_CLINIC_OR_DEPARTMENT_OTHER): Payer: Self-pay | Admitting: Nurse Practitioner

## 2023-09-18 VITALS — BP 135/70 | HR 74 | Ht 63.5 in | Wt 148.0 lb

## 2023-09-18 DIAGNOSIS — I2510 Atherosclerotic heart disease of native coronary artery without angina pectoris: Secondary | ICD-10-CM

## 2023-09-18 DIAGNOSIS — E785 Hyperlipidemia, unspecified: Secondary | ICD-10-CM | POA: Diagnosis not present

## 2023-09-18 DIAGNOSIS — M791 Myalgia, unspecified site: Secondary | ICD-10-CM | POA: Diagnosis not present

## 2023-09-18 DIAGNOSIS — T466X5A Adverse effect of antihyperlipidemic and antiarteriosclerotic drugs, initial encounter: Secondary | ICD-10-CM

## 2023-09-18 DIAGNOSIS — T466X5D Adverse effect of antihyperlipidemic and antiarteriosclerotic drugs, subsequent encounter: Secondary | ICD-10-CM

## 2023-09-18 DIAGNOSIS — I10 Essential (primary) hypertension: Secondary | ICD-10-CM | POA: Diagnosis not present

## 2023-09-18 NOTE — Patient Instructions (Signed)
 Medication Instructions:   Your physician recommends that you continue on your current medications as directed. Please refer to the Current Medication list given to you today.   *If you need a refill on your cardiac medications before your next appointment, please call your pharmacy*  Lab Work:  None ordered.  If you have labs (blood work) drawn today and your tests are completely normal, you will receive your results only by: MyChart Message (if you have MyChart) OR A paper copy in the mail If you have any lab test that is abnormal or we need to change your treatment, we will call you to review the results.  Testing/Procedures:  None ordered.   Follow-Up: At [facility], you and your health needs are our priority.  As part of our continuing mission to provide you with exceptional heart care, our providers are all part of one team.  This team includes your primary Cardiologist (physician) and Advanced Practice Providers or APPs (Physician Assistants and Nurse Practitioners) who all work together to provide you with the care you need, when you need it.  Your next appointment:   1 year(s)  Provider:   K. Italy Hilty, MD or Eligha Bridegroom, NP    We recommend signing up for the patient portal called "MyChart".  Sign up information is provided on this After Visit Summary.  MyChart is used to connect with patients for Virtual Visits (Telemedicine).  Patients are able to view lab/test results, encounter notes, upcoming appointments, etc.  Non-urgent messages can be sent to your provider as well.   To learn more about what you can do with MyChart, go to [email or web address].   Other Instructions  Your physician wants you to follow-up in: 1 year.  You will receive a reminder letter in the mail two months in advance. If you don't receive a letter, please call our office to schedule the follow-up appointment.

## 2023-09-19 ENCOUNTER — Encounter (HOSPITAL_BASED_OUTPATIENT_CLINIC_OR_DEPARTMENT_OTHER): Payer: Self-pay | Admitting: Nurse Practitioner

## 2023-09-24 ENCOUNTER — Other Ambulatory Visit (HOSPITAL_BASED_OUTPATIENT_CLINIC_OR_DEPARTMENT_OTHER): Payer: Self-pay | Admitting: Obstetrics

## 2023-09-24 DIAGNOSIS — M8588 Other specified disorders of bone density and structure, other site: Secondary | ICD-10-CM

## 2023-10-08 NOTE — Progress Notes (Unsigned)
 Follow Up Note  RE: [name] MRN: [id number] DOB: 1955-07-27 Date of Office Visit: 10/09/2023  Referring provider: Norleen Lynwood ORN, MD Primary care provider: Norleen Lynwood ORN, MD  Chief Complaint: No chief complaint on file.  History of Present Illness: I had the pleasure of seeing Heather Merritt for a follow up visit at the [facility] on 10/09/2023. She is a 68 y.o. 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 Her previous allergy office visit was on 04/10/2023 with Dr. Luke. Today is a regular follow up visit.  Discussed the use of AI scribe software for clinical note transcription with the patient, who gave verbal consent to proceed.  History of Present Illness            ***  Assessment and Plan: Heather Merritt is a 68 y.o. female with: Angioedema, subsequent encounter Allergic reaction, subsequent encounter Past history - episodes of lip swelling and facial itching occurring after eating various foods, starting 5 days post-gallbladder surgery. No tongue swelling, throat tightness, or hives. Symptoms improve with Claritin  and Benadryl . No family history of similar symptoms. Not on ace-inhibitor.  Interim history - 2024 labs unremarkable. Episodes of lip and eyelid swelling have decreased in frequency and severity. Recent episodes associated with avocado consumption. Currently managed with Zyrtec 10mg  daily and Famotidine  40mg  at bedtime. Keep track of symptoms and write down what you had done that day. Avoid avocado. Declined bloodwork today. Continue zyrtec (cetirizine) 10mg  once a day.  May take twice a day if needed. In February try to wean to 5mg  once a day.  If you notice issues then okay to restart 10mg .  If symptoms are not controlled or causes drowsiness let us  know. Continue famotidine  40mg  daily as before.  Avoid the following potential triggers: alcohol, tight clothing, NSAIDs, hot showers and getting overheated. For mild  symptoms you can take over the counter antihistamines such as Benadryl  1-2 tablets = 25-50mg  and monitor symptoms closely.  If symptoms worsen or if you have severe symptoms including breathing issues, throat closure, significant swelling, whole body hives, severe diarrhea and vomiting, lightheadedness then seek immediate medical care. Action plan in place.  Assessment and Plan              No follow-ups on file.  No orders of the defined types were placed in this encounter.  Lab Orders  No laboratory test(s) ordered today    Diagnostics: Spirometry:  Tracings reviewed. Her effort: {Blank single:19197::Good reproducible efforts.,It was hard to get consistent efforts and there is a question as to whether this reflects a maximal maneuver.,Poor effort, data can not be interpreted.} FVC: ***L FEV1: ***L, ***% predicted FEV1/FVC ratio: ***% Interpretation: {Blank single:19197::Spirometry consistent with mild obstructive disease,Spirometry consistent with moderate obstructive disease,Spirometry consistent with severe obstructive disease,Spirometry consistent with possible restrictive disease,Spirometry consistent with mixed obstructive and restrictive disease,Spirometry uninterpretable due to technique,Spirometry consistent with normal pattern,No overt abnormalities noted given today's efforts}.  Please see scanned spirometry results for details.  Skin Testing: {Blank single:19197::Select foods,Environmental allergy panel,Environmental allergy panel and select foods,Food allergy panel,None,Deferred due to recent antihistamines use}. *** Results discussed with patient/family.   Medication List:  Current Outpatient Medications  Medication Sig Dispense Refill   aspirin  81 MG tablet Take 81 mg by mouth daily.     atorvastatin  (LIPITOR) 10 MG tablet Take 1 tablet (10 mg total) by mouth daily. 90 tablet 3   Bempedoic Acid  (NEXLETOL ) 180 MG TABS Take 1  tablet  (180 mg total) by mouth daily. 90 tablet 3   Calcium  Citrate (CITRACAL PO) Take 2 tablets by mouth daily.      Cholecalciferol  (VITAMIN D ) 2000 UNITS CAPS Take 2,000 Units by mouth daily.     clopidogrel  (PLAVIX ) 75 MG tablet Take 1 tablet (75 mg total) by mouth daily. 90 tablet 3   EPINEPHrine  0.3 mg/0.3 mL IJ SOAJ injection Inject 0.3 mg into the muscle as needed for anaphylaxis. 2 each 1   esomeprazole  (NEXIUM ) 40 MG capsule Take 1 capsule (40 mg total) by mouth in the morning and at bedtime. 180 capsule 3   estradiol (ESTRACE) 0.1 MG/GM vaginal cream Place 1 Applicatorful vaginally every other day.     ezetimibe  (ZETIA ) 10 MG tablet Take 1 tablet (10 mg total) by mouth daily. 90 tablet 3   famotidine  (PEPCID ) 40 MG tablet Take 1 tablet (40 mg total) by mouth daily. 90 tablet 3   metoprolol  tartrate (LOPRESSOR ) 25 MG tablet Take 0.5 tablets (12.5 mg total) by mouth 2 (two) times daily. 90 tablet 3   metroNIDAZOLE  (METROGEL ) 0.75 % gel Apply topically.     Multiple Vitamin (MULITIVITAMIN WITH MINERALS) TABS Take 1 tablet by mouth daily.     nitroGLYCERIN  (NITROSTAT ) 0.4 MG SL tablet Place 1 tablet (0.4 mg total) under the tongue every 5 (five) minutes x 3 doses as needed for chest pain. 25 tablet 6   pimecrolimus  (ELIDEL ) 1 % cream Apply 1 application topically every morning. 30 g 0   valsartan -hydrochlorothiazide  (DIOVAN -HCT) 160-12.5 MG tablet Take 1 tablet by mouth daily. 90 tablet 3   No current facility-administered medications for this visit.   Allergies: Allergies  Allergen Reactions   Penicillins Hives, Itching and Rash    Did it involve swelling of the face/tongue/throat, SOB, or low BP? Unknown Did it involve sudden or severe rash/hives, skin peeling, or any reaction on the inside of your mouth or nose? Unknown Did you need to seek medical attention at a hospital or doctor's office? Unknown When did it last happen?      pt cant recall If all above answers are "NO", may proceed  with cephalosporin use.    Rosuvastatin Other (See Comments)    REACTION: muscle weakness   Simvastatin Other (See Comments)    REACTION: muscle weakness   Statins Other (See Comments)    Myalgias and weakness   Sulfonamide Derivatives Other (See Comments)    Nausea, vomiting, abd pain   Evolocumab  Swelling and Other (See Comments)    Swelling, itchy eyes, mouth swelling, red/puffy/hot face   Misc. Sulfonamide Containing Compounds Other (See Comments)   Other    Penicillin V Potassium Other (See Comments)   Brilinta  [Ticagrelor ] Rash    Itchy rash to torso   Doxycycline Itching and Rash   Doxycycline Hyclate Itching and Rash   Sulfa Antibiotics Other (See Comments)    Nausea, vomiting, abd pain   I reviewed her past medical history, social history, family history, and environmental history and no significant changes have been reported from her previous visit.  Review of Systems  Constitutional:  Negative for appetite change, chills, fever and unexpected weight change.  HENT:  Negative for congestion and rhinorrhea.        Lip swelling  Eyes:  Negative for itching.  Respiratory:  Negative for cough, chest tightness, shortness of breath and wheezing.   Cardiovascular:  Negative for chest pain.  Gastrointestinal:  Negative for abdominal pain.  Genitourinary:  Negative for difficulty urinating.  Skin:  Negative for rash.  Neurological:  Negative for headaches.    Objective: There were no vitals taken for this visit. There is no height or weight on file to calculate BMI. Physical Exam Vitals and nursing note reviewed.  Constitutional:      Appearance: Normal appearance. She is well-developed.  HENT:     Head: Normocephalic and atraumatic.     Right Ear: Tympanic membrane and external ear normal.     Left Ear: Tympanic membrane and external ear normal.     Nose: Nose normal.     Mouth/Throat:     Mouth: Mucous membranes are moist.     Pharynx: Oropharynx is clear.  Eyes:      Conjunctiva/sclera: Conjunctivae normal.  Cardiovascular:     Rate and Rhythm: Normal rate and regular rhythm.     Heart sounds: Normal heart sounds. No murmur heard.    No friction rub. No gallop.  Pulmonary:     Effort: Pulmonary effort is normal.     Breath sounds: Normal breath sounds. No wheezing, rhonchi or rales.  Musculoskeletal:     Cervical back: Neck supple.  Skin:    General: Skin is warm.     Findings: No rash.  Neurological:     Mental Status: She is alert and oriented to person, place, and time.  Psychiatric:        Behavior: Behavior normal.    Previous notes and tests were reviewed. The plan was reviewed with the patient/family, and all questions/concerned were addressed.  It was my pleasure to see Heather Merritt today and participate in her care. Please feel free to contact me with any questions or concerns.  Sincerely,  Orlan Cramp, DO Allergy & Immunology  [facility]  [facility] office: [phone] [facility] office: [phone]

## 2023-10-09 ENCOUNTER — Encounter: Payer: Self-pay | Admitting: Allergy

## 2023-10-09 ENCOUNTER — Other Ambulatory Visit: Payer: Self-pay

## 2023-10-09 ENCOUNTER — Ambulatory Visit (INDEPENDENT_AMBULATORY_CARE_PROVIDER_SITE_OTHER): Payer: Medicare Other | Admitting: Allergy

## 2023-10-09 VITALS — BP 118/68 | HR 64 | Temp 98.0°F | Resp 18 | Ht 63.5 in | Wt 152.3 lb

## 2023-10-09 DIAGNOSIS — T783XXD Angioneurotic edema, subsequent encounter: Secondary | ICD-10-CM | POA: Diagnosis not present

## 2023-10-09 DIAGNOSIS — T7840XD Allergy, unspecified, subsequent encounter: Secondary | ICD-10-CM

## 2023-10-09 NOTE — Patient Instructions (Addendum)
 Lip swelling Keep track of symptoms and write down what you had done that day. I don't think it was the egg that caused the swelling.  Avoid avocado. Continue zyrtec (cetirizine) 10mg  once a day.  May take twice a day if needed. Continue famotidine  40mg  daily as before.  Avoid the following potential triggers: alcohol, tight clothing, NSAIDs, hot showers and getting overheated. For mild symptoms you can take over the counter antihistamines (zyrtec 10mg  to 20mg ) and monitor symptoms closely.  If symptoms worsen or if you have severe symptoms including breathing issues, throat closure, significant swelling, whole body hives, severe diarrhea and vomiting, lightheadedness then use epinephrine  and seek immediate medical care afterwards. Emergency action plan in place.  Return in about 6 months (around 04/10/2024). Or sooner if needed.

## 2023-10-10 ENCOUNTER — Encounter: Payer: Self-pay | Admitting: Allergy

## 2023-11-01 ENCOUNTER — Telehealth: Payer: Self-pay | Admitting: Internal Medicine

## 2023-11-01 NOTE — Telephone Encounter (Signed)
 Copied from CRM [id number]. 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 She would like to know if the antibiotic could be sent to pharmacy for her?  [id number] [place], [place] - [address]  Phone: [phone] Fax: [phone]

## 2023-11-05 MED ORDER — AZITHROMYCIN 250 MG PO TABS: ORAL_TABLET | ORAL | 0 refills | Status: AC

## 2023-11-05 NOTE — Telephone Encounter (Signed)
 Ok for zpak due to her multiple allergies - done erx

## 2023-11-06 NOTE — Telephone Encounter (Signed)
 Called and spoke with patient. Unfortunately the appointment with the dentist was for this Monday and not next Monday. Was able to have the procedure performed with no issue. Will have prescription for the next appointment in February

## 2023-11-07 ENCOUNTER — Telehealth: Payer: Self-pay | Admitting: Cardiovascular Disease

## 2023-11-07 NOTE — Telephone Encounter (Signed)
    Primary Cardiologist: Dorn Lesches, MD  Chart reviewed as part of pre-operative protocol coverage. Simple dental extractions are considered low risk procedures per guidelines and generally do not require any specific cardiac clearance. It is also generally accepted that for simple extractions and dental cleanings, there is no need to interrupt blood thinner therapy.   SBE prophylaxis is not required for the patient.  I will route this recommendation to the requesting party via Epic fax function and remove from pre-op pool.  Please call with questions.  Orren LOISE Fabry, PA-C 11/07/2023, 1:48 PM

## 2023-11-07 NOTE — Telephone Encounter (Signed)
   Pre-operative Risk Assessment    Patient Name: Heather Merritt  DOB: 20-Apr-1955 MRN: [id number]   Date of last office visit: 09/18/23 Date of next office visit: Not yet scheduled   Request for Surgical Clearance    Procedure:  Dental cleaning - Pre medication  Date of Surgery:  Clearance  - February 2025                                Surgeon:  Dr. Mal Else Surgeon's Group or Practice Name:  [facility] Phone number:  [phone]  Fax number:  [phone]   Type of Clearance Requested:  Medical    Type of Anesthesia:     Additional requests/questions:  Caller [name]) stated patient will be having dental treatments and wants to know if patient will need to be pre-medication prior to treatments.  Signed, Herma KATHEE Blush   11/07/2023, 12:59 PM

## 2023-11-20 ENCOUNTER — Ambulatory Visit (INDEPENDENT_AMBULATORY_CARE_PROVIDER_SITE_OTHER): Payer: Medicare Other

## 2023-11-20 VITALS — Ht 63.0 in | Wt 149.0 lb

## 2023-11-20 DIAGNOSIS — Z0000 Encounter for general adult medical examination without abnormal findings: Secondary | ICD-10-CM | POA: Diagnosis not present

## 2023-11-20 DIAGNOSIS — R739 Hyperglycemia, unspecified: Secondary | ICD-10-CM | POA: Diagnosis not present

## 2023-11-20 NOTE — Progress Notes (Signed)
 Subjective:   Heather Merritt is a 68 y.o. who presents for a Medicare Wellness preventive visit.  As a reminder, Annual Wellness Visits don't include a physical exam, and some assessments may be limited, especially if this visit is performed virtually. We may recommend an in-person follow-up visit with your provider if needed.  Visit Complete: Virtual I connected with  Heather Merritt on 11/20/23 by a audio enabled telemedicine application and verified that I am speaking with the correct person using two identifiers.  Patient Location: Home  Provider Location: Office/Clinic  I discussed the limitations of evaluation and management by telemedicine. The patient expressed understanding and agreed to proceed.  Vital Signs: Because this visit was a virtual/telehealth visit, some criteria may be missing or patient reported. Any vitals not documented were not able to be obtained and vitals that have been documented are patient reported.  VideoDeclined- This patient declined Librarian, academic. Therefore the visit was completed with audio only.  Persons Participating in Visit: Patient.  AWV Questionnaire: No: Patient Medicare AWV questionnaire was not completed prior to this visit.  Cardiac Risk Factors include: advanced age (>77men, >58 women);dyslipidemia;hypertension     Objective:    Today's Vitals   11/20/23 0959  Weight: 149 lb (67.6 kg)  Height: 5' 3 (1.6 m)   Body mass index is 26.39 kg/m.     11/20/2023    9:59 AM 01/25/2023    1:02 PM 01/15/2023   11:08 AM 11/19/2022   10:34 AM 10/15/2022   11:26 PM 09/26/2022    4:36 PM 08/06/2022    2:03 PM  Advanced Directives  Does Patient Have a Medical Advance Directive? Yes Yes Yes Yes No No No  Type of Estate agent of [place];Living will Healthcare Power of [place];Living will Healthcare Power of [place];Living will Healthcare Power of [place];Living will     Does patient  want to make changes to medical advance directive? No - Patient declined No - Patient declined       Copy of Healthcare Power of Attorney in Chart? Yes - validated most recent copy scanned in chart (See row information) No - copy requested  No - copy requested     Would patient like information on creating a medical advance directive?     No - Patient declined      Current Medications (verified) Outpatient Encounter Medications as of 11/20/2023  Medication Sig   aspirin  81 MG tablet Take 81 mg by mouth daily.   atorvastatin  (LIPITOR) 10 MG tablet Take 1 tablet (10 mg total) by mouth daily.   Bempedoic Acid  (NEXLETOL ) 180 MG TABS Take 1 tablet (180 mg total) by mouth daily.   Calcium  Citrate (CITRACAL PO) Take 2 tablets by mouth daily.    Cholecalciferol  (VITAMIN D ) 2000 UNITS CAPS Take 2,000 Units by mouth daily.   clopidogrel  (PLAVIX ) 75 MG tablet Take 1 tablet (75 mg total) by mouth daily.   EPINEPHrine  0.3 mg/0.3 mL IJ SOAJ injection Inject 0.3 mg into the muscle as needed for anaphylaxis.   esomeprazole  (NEXIUM ) 40 MG capsule Take 1 capsule (40 mg total) by mouth in the morning and at bedtime.   estradiol (ESTRACE) 0.1 MG/GM vaginal cream Place 1 Applicatorful vaginally every other day.   ezetimibe  (ZETIA ) 10 MG tablet Take 1 tablet (10 mg total) by mouth daily.   famotidine  (PEPCID ) 40 MG tablet Take 1 tablet (40 mg total) by mouth daily.   metoprolol  tartrate (LOPRESSOR ) 25 MG  tablet Take 0.5 tablets (12.5 mg total) by mouth 2 (two) times daily.   metroNIDAZOLE  (METROGEL ) 0.75 % gel Apply topically.   Multiple Vitamin (MULITIVITAMIN WITH MINERALS) TABS Take 1 tablet by mouth daily.   nitroGLYCERIN  (NITROSTAT ) 0.4 MG SL tablet Place 1 tablet (0.4 mg total) under the tongue every 5 (five) minutes x 3 doses as needed for chest pain.   pimecrolimus  (ELIDEL ) 1 % cream Apply 1 application topically every morning.   valsartan -hydrochlorothiazide  (DIOVAN -HCT) 160-12.5 MG tablet Take 1 tablet by  mouth daily.   No facility-administered encounter medications on file as of 11/20/2023.    Allergies (verified) Penicillins, Rosuvastatin, Simvastatin, Statins, Sulfonamide derivatives, Avocado, Evolocumab , Misc. sulfonamide containing compounds, Other, Penicillin v potassium, Brilinta  [ticagrelor ], Doxycycline, Doxycycline hyclate, and Sulfa antibiotics   History: Past Medical History:  Diagnosis Date   Allergy    SEASONAL   Angio-edema    Arthritis    CAD (coronary artery disease), native coronary artery    a. Cath 10/06/2018 - S/p DES to [facility]; medical therapy for high grade ostial/pro 2nd digonal    Cataracts, bilateral    immature   Chronic back pain    Coronary artery disease    DIVERTICULOSIS, COLON 10/28/2007   Dry skin    red patch on right knee area   Gallstones    GERD 10/28/2007   takes Prevacid  daily   H/O hiatal hernia    HLD (hyperlipidemia) 12/12/2017   Hx of colonic polyps    HYPERLIPIDEMIA 10/28/2007   unable to take meds d/t allergies   HYPERTENSION 10/28/2007   takes Diovan  daily   Internal hemorrhoids    Irritable bowel syndrome (IBS)    Joint pain    Joint swelling    LIVER FUNCTION TESTS, ABNORMAL, HX OF 10/29/2007   Lumbar disc disease    Myocardial infarction (HCC)    Obesity    OSTEOPENIA 10/29/2007   Osteoporosis    PONV (postoperative nausea and vomiting)    with 2013  neck surgery   Presbyacusis 01/04/2010   Rosacea    Seasonal allergies    takes Clarinex  daily   Vertigo    hx of   Vitamin D  deficiency    takes Vit D daily   Past Surgical History:  Procedure Laterality Date   ANTERIOR CERVICAL DECOMP/DISCECTOMY FUSION  08/02/2011   Procedure: ANTERIOR CERVICAL DECOMPRESSION/DISCECTOMY FUSION 3 LEVELS;  Surgeon: Arley SHAUNNA Helling, MD;  Location: [facility];  Service: Neurosurgery;  Laterality: N/A;  Cervical Three-Four, Cervical Four-Five, Cervical Five-Six  Anterior Cervical Decompression Fusion    CARDIAC CATHETERIZATION      CHOLECYSTECTOMY N/A 01/25/2023   Procedure: LAPAROSCOPIC CHOLECYSTECTOMY;  Surgeon: Stevie Herlene Righter, MD;  Location: [facility];  Service: General;  Laterality: N/A;   COLONOSCOPY  2001/2013   CORONARY STENT INTERVENTION N/A 10/06/2018   Procedure: CORONARY STENT INTERVENTION;  Surgeon: Court Dorn PARAS, MD;  Location: [facility];  Service: Cardiovascular;  Laterality: N/A;   ESOPHAGOGASTRODUODENOSCOPY     LEFT HEART CATH AND CORONARY ANGIOGRAPHY N/A 10/06/2018   Procedure: LEFT HEART CATH AND CORONARY ANGIOGRAPHY;  Surgeon: Court Dorn PARAS, MD;  Location: [facility];  Service: Cardiovascular;  Laterality: N/A;   POLYPECTOMY     TONSILLECTOMY     TONSILLECTOMY  at age 66   Family History  Problem Relation Age of Onset   Atrial fibrillation Mother    Hyperlipidemia Mother    Stroke Mother    Heart disease Mother  Atrial fibrillation Father    Hypothyroidism Sister    Hypothyroidism Maternal Grandmother    Diabetes Maternal Grandmother    Anesthesia problems Neg Hx    Hypotension Neg Hx    Malignant hyperthermia Neg Hx    Pseudochol deficiency Neg Hx    Colon cancer Neg Hx    Stomach cancer Neg Hx    Rectal cancer Neg Hx    Esophageal cancer Neg Hx    Social History   Socioeconomic History   Marital status: Divorced    Spouse name: Not on file   Number of children: 0   Years of education: 12   Highest education level: 12th grade  Occupational History   Not on file  Tobacco Use   Smoking status: Former    Current packs/day: 0.00    Average packs/day: 1 pack/day for 20.0 years (20.0 ttl pk-yrs)    Types: Cigarettes    Start date: 04/02/1973    Quit date: 04/1993    Years since quitting: 30.6   Smokeless tobacco: Never   Tobacco comments:    quit in 25 yrs ago  Vaping Use   Vaping status: Never Used  Substance and Sexual Activity   Alcohol use: Not Currently    Comment: rare   Drug use: No   Sexual activity: Yes    Birth control/protection:  Post-menopausal  Other Topics Concern   Not on file  Social History Narrative   Not on file   Social Drivers of Health   Financial Resource Strain: Low Risk  (11/20/2023)   Overall Financial Resource Strain (CARDIA)    Difficulty of Paying Living Expenses: Not hard at all  Food Insecurity: No Food Insecurity (11/20/2023)   Hunger Vital Sign    Worried About Running Out of Food in the Last Year: Never true    Ran Out of Food in the Last Year: Never true  Transportation Needs: No Transportation Needs (11/20/2023)   PRAPARE - Administrator, Civil Service (Medical): No    Lack of Transportation (Non-Medical): No  Physical Activity: Sufficiently Active (11/20/2023)   Exercise Vital Sign    Days of Exercise per Week: 4 days    Minutes of Exercise per Session: 60 min  Stress: No Stress Concern Present (11/20/2023)   [employer] of Occupational Health - Occupational Stress Questionnaire    Feeling of Stress: Not at all  Social Connections: Socially Isolated (11/20/2023)   Social Connection and Isolation Panel    Frequency of Communication with Friends and Family: More than three times a week    Frequency of Social Gatherings with Friends and Family: Twice a week    Attends Religious Services: Patient declined    Database administrator or Organizations: No    Attends Engineer, structural: Never    Marital Status: Divorced    Tobacco Counseling Counseling given: No Tobacco comments: quit in 25 yrs ago    Clinical Intake:  Pre-visit preparation completed: Yes  Pain : No/denies pain     BMI - recorded: 26.39 Nutritional Status: BMI 25 -29 Overweight Nutritional Risks: None Diabetes: No  Lab Results  Component Value Date   HGBA1C 5.5 09/24/2022   HGBA1C 5.5 09/12/2021   HGBA1C 5.6 09/12/2020     How often do you need to have someone help you when you read instructions, pamphlets, or other written materials from your doctor or pharmacy?: 1 -  Never  Interpreter Needed?: No  Information entered by ::  Verdie Saba, CMA   Activities of Daily Living     11/20/2023   10:05 AM 01/15/2023   11:10 AM  In your present state of health, do you have any difficulty performing the following activities:  Hearing? 0   Vision? 0   Difficulty concentrating or making decisions? 0   Walking or climbing stairs? 0   Dressing or bathing? 0   Doing errands, shopping? 0 0  Preparing Food and eating ? N   Using the Toilet? N   In the past six months, have you accidently leaked urine? N   Do you have problems with loss of bowel control? N   Managing your Medications? N   Managing your Finances? N   Housekeeping or managing your Housekeeping? N     Patient Care Team: Norleen Lynwood ORN, MD as PCP - General Court Dorn PARAS, MD as PCP - Cardiology (Cardiology) Marcey Elspeth PARAS, MD as Consulting Physician (Ophthalmology) Kandyce Sor, MD as Consulting Physician (Obstetrics and Gynecology) Imaging, [facility] as Consulting Physician (Diagnostic Radiology) Abran Norleen SAILOR, MD as Consulting Physician (Gastroenterology)  I have updated your Care Teams any recent Medical Services you may have received from other providers in the past year.     Assessment:   This is a routine wellness examination for Neka.  Hearing/Vision screen Hearing Screening - Comments:: Denies hearing difficulties   Vision Screening - Comments:: Denies vision concerns - up-to-date exams with Elspeth Marcey   Goals Addressed               This Visit's Progress     Patient Stated (pt-stated)        Patient stated she plans to exercise 5x a week at the gym instead of 3x a week       Depression Screen     11/20/2023   10:06 AM 05/24/2023    3:54 PM 04/08/2023   10:29 AM 11/19/2022   10:34 AM 09/24/2022    2:10 PM 09/12/2021   10:31 AM 09/12/2020   11:52 AM  PHQ 2/9 Scores  PHQ - 2 Score 0 0 0 0 0 0 0  PHQ- 9 Score 0   0  0     Fall Risk      11/20/2023   10:05 AM 05/24/2023    4:05 PM 04/08/2023   10:29 AM 11/19/2022   10:34 AM 11/18/2022    8:15 PM  Fall Risk   Falls in the past year? 0 0 0 0 0  Number falls in past yr: 0 0 0 0 0  Injury with Fall? 0 0 0 0 0  Risk for fall due to : No Fall Risks No Fall Risks No Fall Risks No Fall Risks   Follow up Falls evaluation completed;Falls prevention discussed Falls evaluation completed Falls evaluation completed Falls prevention discussed     MEDICARE RISK AT HOME:  Medicare Risk at Home Any stairs in or around the home?: Yes If so, are there any without handrails?: No Home free of loose throw rugs in walkways, pet beds, electrical cords, etc?: Yes Adequate lighting in your home to reduce risk of falls?: Yes Life alert?: No Use of a cane, walker or w/c?: No Grab bars in the bathroom?: Yes Shower chair or bench in shower?: No Elevated toilet seat or a handicapped toilet?: Yes  TIMED UP AND GO:  Was the test performed?  No  Cognitive Function: 6CIT completed  11/20/2023   10:15 AM 11/19/2022   10:35 AM  6CIT Screen  What Year? 0 points 0 points  What month? 0 points 0 points  What time? 0 points 0 points  Count back from 20 0 points 0 points  Months in reverse 0 points 0 points  Repeat phrase 0 points 0 points  Total Score 0 points 0 points    Immunizations Immunization History  Administered Date(s) Administered   Fluad Quad(high Dose 65+) 02/15/2020, 03/07/2022   Influenza Split 01/01/2011   Influenza, High Dose Seasonal PF 01/21/2019   Influenza, Seasonal, Injecte, Preservative Fre 03/01/2014   Influenza,inj,Quad PF,6+ Mos 04/14/2013   PFIZER(Purple Top)SARS-COV-2 Vaccination 04/26/2019, 05/18/2019, 03/10/2020   Pfizer Covid-19 Vaccine Bivalent Booster 73yrs & up 01/31/2021   Pneumococcal Polysaccharide-23 04/04/2022   Td 06/24/2003, 04/02/2004   Td (Adult), 2 Lf Tetanus Toxid, Preservative Free 06/24/2003   Tdap 06/05/2016   Zoster  Recombinant(Shingrix) 12/22/2017, 04/02/2018, 07/05/2018    Screening Tests Health Maintenance  Topic Date Due   Diabetic kidney evaluation - Urine ACR  Never done   COVID-19 Vaccine (5 - 2024-25 season) 12/02/2022   HEMOGLOBIN A1C  03/26/2023   Pneumococcal Vaccine: 50+ Years (2 of 2 - PCV) 04/05/2023   INFLUENZA VACCINE  11/01/2023   OPHTHALMOLOGY EXAM  02/04/2024   Diabetic kidney evaluation - eGFR measurement  02/27/2024   FOOT EXAM  04/07/2024   Medicare Annual Wellness (AWV)  11/19/2024   MAMMOGRAM  09/01/2025   DTaP/Tdap/Td (5 - Td or Tdap) 06/06/2026   Colonoscopy  10/14/2032   DEXA SCAN  Completed   Hepatitis C Screening  Completed   Zoster Vaccines- Shingrix  Completed   HPV VACCINES  Aged Out   Meningococcal B Vaccine  Aged Out    Health Maintenance  Health Maintenance Due  Topic Date Due   Diabetic kidney evaluation - Urine ACR  Never done   COVID-19 Vaccine (5 - 2024-25 season) 12/02/2022   HEMOGLOBIN A1C  03/26/2023   Pneumococcal Vaccine: 50+ Years (2 of 2 - PCV) 04/05/2023   INFLUENZA VACCINE  11/01/2023   Health Maintenance Items Addressed:  Labs Ordered: Diabetic Kidney Urine ACR and a Hemoglobin A1C  Additional Screening:  Vision Screening: Recommended annual ophthalmology exams for early detection of glaucoma and other disorders of the eye. Would you like a referral to an eye doctor? No    Dental Screening: Recommended annual dental exams for proper oral hygiene  Community Resource Referral / Chronic Care Management: CRR required this visit?  No   CCM required this visit?  No   Plan:    I have personally reviewed and noted the following in the patient's chart:   Medical and social history Use of alcohol, tobacco or illicit drugs  Current medications and supplements including opioid prescriptions. Patient is not currently taking opioid prescriptions. Functional ability and status Nutritional status Physical activity Advanced  directives List of other physicians Hospitalizations, surgeries, and ER visits in previous 12 months Vitals Screenings to include cognitive, depression, and falls Referrals and appointments  In addition, I have reviewed and discussed with patient certain preventive protocols, quality metrics, and best practice recommendations. A written personalized care plan for preventive services as well as general preventive health recommendations were provided to patient.   Verdie CHRISTELLA Saba, CMA   11/20/2023   After Visit Summary: (MyChart) Due to this being a telephonic visit, the after visit summary with patients personalized plan was offered to patient via MyChart   Notes: Nothing  significant to report at this time.

## 2023-11-20 NOTE — Patient Instructions (Addendum)
 Ms. Pontarelli , Thank you for taking time out of your busy schedule to complete your Annual Wellness Visit with me. I enjoyed our conversation and look forward to speaking with you again next year. I, as well as your care team,  appreciate your ongoing commitment to your health goals. Please review the following plan we discussed and let me know if I can assist you in the future. Your Game plan/ To Do List    Referrals: If you haven't heard from the office you've been referred to, please reach out to them at the phone provided. Ordered a Diabetic Kidney Urine ACR and a Hemoglobin A1C   Follow up Visits: We will see or speak with you next year for your Next Medicare AWV with our clinical staff Have you seen your provider in the last 6 months (3 months if uncontrolled diabetes)? Yes  Clinician Recommendations:  Aim for 30 minutes of exercise or brisk walking, 6-8 glasses of water, and 5 servings of fruits and vegetables each day. Educated and advised on getting the Pneumonia vaccine in 2025.      This is a list of the screenings recommended for you:  Health Maintenance  Topic Date Due   Yearly kidney health urinalysis for diabetes  Never done   COVID-19 Vaccine (5 - 2024-25 season) 12/02/2022   Hemoglobin A1C  03/26/2023   Pneumococcal Vaccine for age over 35 (2 of 2 - PCV) 04/05/2023   Flu Shot  11/01/2023   Eye exam for diabetics  02/04/2024   Yearly kidney function blood test for diabetes  02/27/2024   Complete foot exam   04/07/2024   Medicare Annual Wellness Visit  11/19/2024   Mammogram  09/01/2025   DTaP/Tdap/Td vaccine (5 - Td or Tdap) 06/06/2026   Colon Cancer Screening  10/14/2032   DEXA scan (bone density measurement)  Completed   Hepatitis C Screening  Completed   Zoster (Shingles) Vaccine  Completed   HPV Vaccine  Aged Out   Meningitis B Vaccine  Aged Out    Advanced directives: (In Chart) A copy of your advanced directives are scanned into your chart should your provider  ever need it. Advance Care Planning is important because it:  [x]  Makes sure you receive the medical care that is consistent with your values, goals, and preferences  [x]  It provides guidance to your family and loved ones and reduces their decisional burden about whether or not they are making the right decisions based on your wishes.  Follow the link provided in your after visit summary or read over the paperwork we have mailed to you to help you started getting your Advance Directives in place. If you need assistance in completing these, please reach out to us  so that we can help you!

## 2023-11-22 ENCOUNTER — Other Ambulatory Visit (INDEPENDENT_AMBULATORY_CARE_PROVIDER_SITE_OTHER)

## 2023-11-22 ENCOUNTER — Encounter: Payer: Self-pay | Admitting: Nurse Practitioner

## 2023-11-22 ENCOUNTER — Ambulatory Visit (INDEPENDENT_AMBULATORY_CARE_PROVIDER_SITE_OTHER): Admitting: Nurse Practitioner

## 2023-11-22 VITALS — BP 110/68 | HR 75 | Ht 63.5 in | Wt 151.2 lb

## 2023-11-22 DIAGNOSIS — R1032 Left lower quadrant pain: Secondary | ICD-10-CM | POA: Diagnosis not present

## 2023-11-22 DIAGNOSIS — K5730 Diverticulosis of large intestine without perforation or abscess without bleeding: Secondary | ICD-10-CM

## 2023-11-22 LAB — CBC WITH DIFFERENTIAL/PLATELET
Basophils Absolute: 0 K/uL (ref 0.0–0.1)
Basophils Relative: 0.3 % (ref 0.0–3.0)
Eosinophils Absolute: 0 K/uL (ref 0.0–0.7)
Eosinophils Relative: 0.6 % (ref 0.0–5.0)
HCT: 37.9 % (ref 36.0–46.0)
Hemoglobin: 12.8 g/dL (ref 12.0–15.0)
Lymphocytes Relative: 23.2 % (ref 12.0–46.0)
Lymphs Abs: 1.2 K/uL (ref 0.7–4.0)
MCHC: 33.8 g/dL (ref 30.0–36.0)
MCV: 89.9 fl (ref 78.0–100.0)
Monocytes Absolute: 0.3 K/uL (ref 0.1–1.0)
Monocytes Relative: 6.3 % (ref 3.0–12.0)
Neutro Abs: 3.6 K/uL (ref 1.4–7.7)
Neutrophils Relative %: 69.6 % (ref 43.0–77.0)
Platelets: 335 K/uL (ref 150.0–400.0)
RBC: 4.21 Mil/uL (ref 3.87–5.11)
RDW: 12.9 % (ref 11.5–15.5)
WBC: 5.1 K/uL (ref 4.0–10.5)

## 2023-11-22 LAB — COMPREHENSIVE METABOLIC PANEL WITH GFR
ALT: 18 U/L (ref 0–35)
AST: 21 U/L (ref 0–37)
Albumin: 4.6 g/dL (ref 3.5–5.2)
Alkaline Phosphatase: 47 U/L (ref 39–117)
BUN: 26 mg/dL — ABNORMAL HIGH (ref 6–23)
CO2: 33 meq/L — ABNORMAL HIGH (ref 19–32)
Calcium: 9.5 mg/dL (ref 8.4–10.5)
Chloride: 100 meq/L (ref 96–112)
Creatinine, Ser: 0.72 mg/dL (ref 0.40–1.20)
GFR: 84.69 mL/min (ref >60.00)
Glucose, Bld: 103 mg/dL — ABNORMAL HIGH (ref 70–99)
Potassium: 4.3 meq/L (ref 3.5–5.1)
Sodium: 140 meq/L (ref 135–145)
Total Bilirubin: 0.3 mg/dL (ref 0.2–1.2)
Total Protein: 7.5 g/dL (ref 6.0–8.3)

## 2023-11-22 NOTE — Progress Notes (Signed)
 Noted

## 2023-11-22 NOTE — Patient Instructions (Addendum)
 Your provider has requested that you go to the basement level for lab work before leaving today. Press B on the elevator. The lab is located at the first door on the left as you exit the elevator.  Please purchase the following medications over the counter and take as directed:  You may use Gas-X 1 tablet by mouth twice daily as needed.   Go to ER if you develop severe lower pain abdominal pain.   Due to recent changes in healthcare laws, you may see the results of your imaging and laboratory studies on MyChart before your provider has had a chance to review them.  We understand that in some cases there may be results that are confusing or concerning to you. Not all laboratory results come back in the same time frame and the provider may be waiting for multiple results in order to interpret others.  Please give us  48 hours in order for your provider to thoroughly review all the results before contacting the office for clarification of your results.   _______________________________________________________  If your blood pressure at your visit was 140/90 or greater, please contact your primary care physician to follow up on this.  _______________________________________________________  If you are age 70 or older, your body mass index should be between 23-30. Your Body mass index is 26.37 kg/m. If this is out of the aforementioned range listed, please consider follow up with your Primary Care Provider.  If you are age 70 or younger, your body mass index should be between 19-25. Your Body mass index is 26.37 kg/m. If this is out of the aformentioned range listed, please consider follow up with your Primary Care Provider.   ________________________________________________________  The [facility] providers would like to encourage you to use MYCHART to communicate with providers for non-urgent requests or questions.  Due to long hold times on the telephone, sending your provider a message by  [facility] may be a faster and more efficient way to get a response.  Please allow 48 business hours for a response.  Please remember that this is for non-urgent requests.  _______________________________________________________  [facility] is using a team-based approach to care.  Your team is made up of your doctor and two to three APPS. Our APPS (Nurse Practitioners and Physician Assistants) work with your physician to ensure care continuity for you. They are fully qualified to address your health concerns and develop a treatment plan. They communicate directly with your gastroenterologist to care for you. Seeing the Advanced Practice Practitioners on your physician's team can help you by facilitating care more promptly, often allowing for earlier appointments, access to diagnostic testing, procedures, and other specialty referrals.   Thank you for choosing me and [facility].

## 2023-11-22 NOTE — Progress Notes (Signed)
 11/22/2023 Heather Merritt [id number] 06-Aug-1955   Chief Complaint: LLQ pain  History of Present Illness: Heather Merritt is a 68 year old female with a past medical history of hypertension, hyperlipidemia, coronary artery disease s/p MI and DES 10/2018 on Plavix , arthritis, GERD and colon polyps. Status postcholecystectomy 01/25/2023. She is known by Dr. Abran. She presents today for further evaluation regarding LLQ pain which started 1 and half weeks ago and comes and goes, improves after passing a bowel movement. She has abdominal gas and sometimes could not pass gas with associated increased lower abdominal pain which abated after she eventually passed gas per the rectum. On Saturday 8/16 she had LLQ pain all day long. She saw her gynecologist a few days ago and pelvic exam showed stable bilateral ovarian cysts. She denies having any constipation. She typically passes a normal formed brown stool once or twice daily no bloody or black stools. Her bowels have been better regulated since she underwent a cholecystectomy 01/2023. She developed perioral swelling and a rash over her mouth and neck 5 days status post cholecystectomy which occurred after eating multiple foods. She was seen by an allergist and started on Zyrtec and her perioral swelling and rash eventually abated.  Her most recent colonoscopy was 09/2012 which showed diverticulosis in the right and left colon otherwise was unremarkable.  A colonoscopy in 10 years was recommended. No prior history of diverticulitis. She eats a high-fiber diet. No GERD symptoms.     Latest Ref Rng & Units 02/27/2023    9:57 AM 01/15/2023   11:30 AM 10/16/2022   12:10 AM  CBC  WBC 3.4 - 10.8 x10E3/uL 3.7  3.6  4.7   Hemoglobin 11.1 - 15.9 g/dL 85.9  86.1  88.2   Hematocrit 34.0 - 46.6 % 42.3  41.6  35.1   Platelets 150 - 450 x10E3/uL 299  247  261         Latest Ref Rng & Units 02/27/2023    9:57 AM 01/15/2023   11:30 AM 10/16/2022   12:10 AM   CMP  Glucose 70 - 99 mg/dL 89  898  884   BUN 8 - 27 mg/dL 19  22  14    Creatinine 0.57 - 1.00 mg/dL 9.29  9.33  9.25   Sodium 134 - 144 mmol/L 141  135  140   Potassium 3.5 - 5.2 mmol/L 4.3  4.3  3.9   Chloride 96 - 106 mmol/L 100  99  102   CO2 20 - 29 mmol/L 27  28  28    Calcium  8.7 - 10.3 mg/dL 9.9  9.5  8.8   Total Protein 6.0 - 8.5 g/dL 7.3   6.5   Total Bilirubin 0.0 - 1.2 mg/dL 0.3   0.6   Alkaline Phos 44 - 121 IU/L 67   47   AST 0 - 40 IU/L 29   73   ALT 0 - 32 IU/L 22   72     EGD 10/15/2022: - Normal esophagus.  - Normal stomach. Small hiatal hernia. Incidental benign fundic gland polyps.  - Normal examined duodenum.  - No specimens collected.  Colonoscopy 10/15/2022: - Diverticulosis in the left colon and in the right colon.  - Hemorrhoids. - The examination was otherwise normal on direct and retroflexion views.  - No specimens collected. - Recall colonoscopy 10 years   Past Medical History:  Diagnosis Date   Allergy    SEASONAL   Angio-edema  Arthritis    CAD (coronary artery disease), native coronary artery    a. Cath 10/06/2018 - S/p DES to [facility]; medical therapy for high grade ostial/pro 2nd digonal    Cataracts, bilateral    immature   Chronic back pain    Coronary artery disease    DIVERTICULOSIS, COLON 10/28/2007   Dry skin    red patch on right knee area   Gallstones    GERD 10/28/2007   takes Prevacid  daily   H/O hiatal hernia    HLD (hyperlipidemia) 12/12/2017   Hx of colonic polyps    HYPERLIPIDEMIA 10/28/2007   unable to take meds d/t allergies   HYPERTENSION 10/28/2007   takes Diovan  daily   Internal hemorrhoids    Irritable bowel syndrome (IBS)    Joint pain    Joint swelling    LIVER FUNCTION TESTS, ABNORMAL, HX OF 10/29/2007   Lumbar disc disease    Myocardial infarction (HCC)    Obesity    OSTEOPENIA 10/29/2007   Osteoporosis    PONV (postoperative nausea and vomiting)    with 2013  neck surgery   Presbyacusis 01/04/2010    Rosacea    Seasonal allergies    takes Clarinex  daily   Vertigo    hx of   Vitamin D  deficiency    takes Vit D daily   Past Surgical History:  Procedure Laterality Date   ANTERIOR CERVICAL DECOMP/DISCECTOMY FUSION  08/02/2011   Procedure: ANTERIOR CERVICAL DECOMPRESSION/DISCECTOMY FUSION 3 LEVELS;  Surgeon: Arley SHAUNNA Helling, MD;  Location: [facility];  Service: Neurosurgery;  Laterality: N/A;  Cervical Three-Four, Cervical Four-Five, Cervical Five-Six  Anterior Cervical Decompression Fusion    CARDIAC CATHETERIZATION     CHOLECYSTECTOMY N/A 01/25/2023   Procedure: LAPAROSCOPIC CHOLECYSTECTOMY;  Surgeon: Stevie Herlene Righter, MD;  Location: [facility];  Service: General;  Laterality: N/A;   COLONOSCOPY  2001/2013   CORONARY STENT INTERVENTION N/A 10/06/2018   Procedure: CORONARY STENT INTERVENTION;  Surgeon: Court Dorn PARAS, MD;  Location: [facility];  Service: Cardiovascular;  Laterality: N/A;   ESOPHAGOGASTRODUODENOSCOPY     LEFT HEART CATH AND CORONARY ANGIOGRAPHY N/A 10/06/2018   Procedure: LEFT HEART CATH AND CORONARY ANGIOGRAPHY;  Surgeon: Court Dorn PARAS, MD;  [facility]: [facility];  Service: Cardiovascular;  Laterality: N/A;   POLYPECTOMY     TONSILLECTOMY     TONSILLECTOMY  at age 69   Current Outpatient Medications on File Prior to Visit  Medication Sig Dispense Refill   aspirin  81 MG tablet Take 81 mg by mouth daily.     atorvastatin  (LIPITOR) 10 MG tablet Take 1 tablet (10 mg total) by mouth daily. 90 tablet 3   Bempedoic Acid  (NEXLETOL ) 180 MG TABS Take 1 tablet (180 mg total) by mouth daily. 90 tablet 3   Calcium  Citrate (CITRACAL PO) Take 2 tablets by mouth daily.      Cholecalciferol  (VITAMIN D ) 2000 UNITS CAPS Take 2,000 Units by mouth daily.     clopidogrel  (PLAVIX ) 75 MG tablet Take 1 tablet (75 mg total) by mouth daily. 90 tablet 3   EPINEPHrine  0.3 mg/0.3 mL IJ SOAJ injection Inject 0.3 mg into the muscle as needed for anaphylaxis. 2 each 1   esomeprazole   (NEXIUM ) 40 MG capsule Take 1 capsule (40 mg total) by mouth in the morning and at bedtime. 180 capsule 3   estradiol (ESTRACE) 0.1 MG/GM vaginal cream Place 1 Applicatorful vaginally every other day.     ezetimibe  (ZETIA ) 10  MG tablet Take 1 tablet (10 mg total) by mouth daily. 90 tablet 3   famotidine  (PEPCID ) 40 MG tablet Take 1 tablet (40 mg total) by mouth daily. 90 tablet 3   metoprolol  tartrate (LOPRESSOR ) 25 MG tablet Take 0.5 tablets (12.5 mg total) by mouth 2 (two) times daily. 90 tablet 3   metroNIDAZOLE  (METROGEL ) 0.75 % gel Apply topically.     Multiple Vitamin (MULITIVITAMIN WITH MINERALS) TABS Take 1 tablet by mouth daily.     nitroGLYCERIN  (NITROSTAT ) 0.4 MG SL tablet Place 1 tablet (0.4 mg total) under the tongue every 5 (five) minutes x 3 doses as needed for chest pain. 25 tablet 6   pimecrolimus  (ELIDEL ) 1 % cream Apply 1 application topically every morning. 30 g 0   valsartan -hydrochlorothiazide  (DIOVAN -HCT) 160-12.5 MG tablet Take 1 tablet by mouth daily. 90 tablet 3   No current facility-administered medications on file prior to visit.   Allergies  Allergen Reactions   Penicillins Hives, Itching and Rash    Did it involve swelling of the face/tongue/throat, SOB, or low BP? Unknown Did it involve sudden or severe rash/hives, skin peeling, or any reaction on the inside of your mouth or nose? Unknown Did you need to seek medical attention at a hospital or doctor's office? Unknown When did it last happen?      pt cant recall If all above answers are "NO", may proceed with cephalosporin use.    Rosuvastatin Other (See Comments)    REACTION: muscle weakness   Simvastatin Other (See Comments)    REACTION: muscle weakness   Statins Other (See Comments)    Myalgias and weakness   Sulfonamide Derivatives Other (See Comments)    Nausea, vomiting, abd pain   Avocado Other (See Comments)   Evolocumab  Swelling and Other (See Comments)    Swelling, itchy eyes, mouth swelling,  red/puffy/hot face   Misc. Sulfonamide Containing Compounds Other (See Comments)   Other    Penicillin V Potassium Other (See Comments)   Brilinta  [Ticagrelor ] Rash    Itchy rash to torso   Doxycycline Itching and Rash   Doxycycline Hyclate Itching and Rash   Sulfa Antibiotics Other (See Comments)    Nausea, vomiting, abd pain   Current Medications, Allergies, Past Medical History, Past Surgical History, Family History and Social History were reviewed in [employer] record.  Review of Systems:   Constitutional: Negative for fever, sweats, chills or weight loss.  Respiratory: Negative for shortness of breath.   Cardiovascular: Negative for chest pain, palpitations and leg swelling.  Gastrointestinal: See HPI.  Musculoskeletal: Negative for back pain or muscle aches.  Neurological: Negative for dizziness, headaches or paresthesias.   Physical Exam: BP 110/68   Pulse 75   Ht 5' 3.5 (1.613 m)   Wt 151 lb 4 oz (68.6 kg)   BMI 26.37 kg/m  Wt Readings from Last 3 Encounters:  11/22/23 151 lb 4 oz (68.6 kg)  11/20/23 149 lb (67.6 kg)  10/09/23 152 lb 4.8 oz (69.1 kg)    General: 68 year old female in no acute distress. Head: Normocephalic and atraumatic. Eyes: No scleral icterus. Conjunctiva pink . Ears: Normal auditory acuity. Mouth: Dentition intact. No ulcers or lesions.  Lungs: Clear throughout to auscultation. Heart: Regular rate and rhythm, no murmur. Abdomen: Soft, nontender and nondistended. No masses or hepatomegaly. Normal bowel sounds x 4 quadrants.  Rectal: Deferred. Musculoskeletal: Symmetrical with no gross deformities. Extremities: No edema. Neurological: Alert oriented x 4. No focal deficits.  Psychological: Alert and cooperative. Normal mood and affect  Assessment and Recommendations:  69 year old female with LLQ pain x 1-1/2 weeks, significantly decreased over the past 2 days.  History of right and left diverticulosis per colonoscopy  10/2022.  No history of diverticulitis. - CBC, CMP - CTAP deferred for now as patient's abdominal exam today was negative - Patient to contact office if LLQ pain recurs and to go to the emergency room if she develops severe lower abdominal pain over the weekend - Drink 64 ounces of water daily, fiber diet as tolerated  GERD, stable.  EGD 10/2022 showed a small hiatal hernia and benign fundic gland polyps. - Continue his Esomeprazole  40 mg twice daily and Famotidine  40 mg daily as needed  History of colon polyps.  Colonoscopy 10/2022 showed diverticulosis in the right and left colon, no polyps. - Next colonoscopy due 09/2032  CAD s/p MI and DES 10/2018 on Plavix 

## 2023-11-25 ENCOUNTER — Ambulatory Visit: Payer: Self-pay | Admitting: Nurse Practitioner

## 2024-03-02 ENCOUNTER — Ambulatory Visit (HOSPITAL_BASED_OUTPATIENT_CLINIC_OR_DEPARTMENT_OTHER)
Admission: RE | Admit: 2024-03-02 | Discharge: 2024-03-02 | Disposition: A | Discharge status: Home / Self Care | Source: Ambulatory Visit | Attending: Obstetrics | Admitting: Obstetrics

## 2024-03-02 DIAGNOSIS — M8588 Other specified disorders of bone density and structure, other site: Secondary | ICD-10-CM | POA: Diagnosis present

## 2024-04-08 ENCOUNTER — Encounter: Payer: Self-pay | Admitting: Internal Medicine

## 2024-04-08 ENCOUNTER — Ambulatory Visit: Payer: Self-pay | Admitting: Internal Medicine

## 2024-04-08 ENCOUNTER — Ambulatory Visit: Admitting: Allergy

## 2024-04-08 ENCOUNTER — Ambulatory Visit: Payer: Medicare Other | Admitting: Internal Medicine

## 2024-04-08 ENCOUNTER — Encounter: Payer: Self-pay | Admitting: Cardiovascular Disease

## 2024-04-08 VITALS — BP 120/78 | HR 60 | Temp 98.6°F | Ht 63.5 in | Wt 157.0 lb

## 2024-04-08 DIAGNOSIS — Z23 Encounter for immunization: Secondary | ICD-10-CM | POA: Diagnosis not present

## 2024-04-08 DIAGNOSIS — K219 Gastro-esophageal reflux disease without esophagitis: Secondary | ICD-10-CM

## 2024-04-08 DIAGNOSIS — E538 Deficiency of other specified B group vitamins: Secondary | ICD-10-CM

## 2024-04-08 DIAGNOSIS — R739 Hyperglycemia, unspecified: Secondary | ICD-10-CM

## 2024-04-08 DIAGNOSIS — I10 Essential (primary) hypertension: Secondary | ICD-10-CM | POA: Diagnosis not present

## 2024-04-08 DIAGNOSIS — E782 Mixed hyperlipidemia: Secondary | ICD-10-CM | POA: Diagnosis not present

## 2024-04-08 DIAGNOSIS — E559 Vitamin D deficiency, unspecified: Secondary | ICD-10-CM | POA: Diagnosis not present

## 2024-04-08 LAB — URINALYSIS, ROUTINE W REFLEX MICROSCOPIC
Bilirubin Urine: NEGATIVE
Hgb urine dipstick: NEGATIVE
Ketones, ur: NEGATIVE
Leukocytes,Ua: NEGATIVE
Nitrite: NEGATIVE
Specific Gravity, Urine: 1.01 (ref 1.000–1.030)
Total Protein, Urine: NEGATIVE
Urine Glucose: NEGATIVE
Urobilinogen, UA: 0.2 (ref 0.0–1.0)
pH: 8 (ref 5.0–8.0)

## 2024-04-08 LAB — CBC WITH DIFFERENTIAL/PLATELET
Basophils Absolute: 0 K/uL (ref 0.0–0.1)
Basophils Relative: 0.7 % (ref 0.0–3.0)
Eosinophils Absolute: 0.1 K/uL (ref 0.0–0.7)
Eosinophils Relative: 3.6 % (ref 0.0–5.0)
HCT: 40.4 % (ref 36.0–46.0)
Hemoglobin: 13.6 g/dL (ref 12.0–15.0)
Lymphocytes Relative: 32.9 % (ref 12.0–46.0)
Lymphs Abs: 1 K/uL (ref 0.7–4.0)
MCHC: 33.6 g/dL (ref 30.0–36.0)
MCV: 90.8 fl (ref 78.0–100.0)
Monocytes Absolute: 0.3 K/uL (ref 0.1–1.0)
Monocytes Relative: 10.1 % (ref 3.0–12.0)
Neutro Abs: 1.6 K/uL (ref 1.4–7.7)
Neutrophils Relative %: 52.7 % (ref 43.0–77.0)
Platelets: 309 K/uL (ref 150.0–400.0)
RBC: 4.45 Mil/uL (ref 3.87–5.11)
RDW: 13.2 % (ref 11.5–15.5)
WBC: 3 K/uL — ABNORMAL LOW (ref 4.0–10.5)

## 2024-04-08 LAB — LIPID PANEL
Cholesterol: 129 mg/dL (ref 28–200)
HDL: 52.6 mg/dL
LDL Cholesterol: 52 mg/dL (ref 10–99)
NonHDL: 76.35
Total CHOL/HDL Ratio: 2
Triglycerides: 122 mg/dL (ref 10.0–149.0)
VLDL: 24.4 mg/dL (ref 0.0–40.0)

## 2024-04-08 LAB — HEMOGLOBIN A1C: Hgb A1c MFr Bld: 5.4 % (ref 4.6–6.5)

## 2024-04-08 LAB — BASIC METABOLIC PANEL WITH GFR
BUN: 21 mg/dL (ref 6–23)
CO2: 32 meq/L (ref 19–32)
Calcium: 9.7 mg/dL (ref 8.4–10.5)
Chloride: 100 meq/L (ref 96–112)
Creatinine, Ser: 0.65 mg/dL (ref 0.40–1.20)
GFR: 88.95 mL/min
Glucose, Bld: 94 mg/dL (ref 70–99)
Potassium: 4.1 meq/L (ref 3.5–5.1)
Sodium: 139 meq/L (ref 135–145)

## 2024-04-08 LAB — HEPATIC FUNCTION PANEL
ALT: 33 U/L (ref 3–35)
AST: 33 U/L (ref 5–37)
Albumin: 5 g/dL (ref 3.5–5.2)
Alkaline Phosphatase: 50 U/L (ref 39–117)
Bilirubin, Direct: 0.1 mg/dL (ref 0.1–0.3)
Total Bilirubin: 0.6 mg/dL (ref 0.2–1.2)
Total Protein: 7.7 g/dL (ref 6.0–8.3)

## 2024-04-08 LAB — VITAMIN B12: Vitamin B-12: 691 pg/mL (ref 211–911)

## 2024-04-08 LAB — TSH: TSH: 1.74 u[IU]/mL (ref 0.35–5.50)

## 2024-04-08 LAB — VITAMIN D 25 HYDROXY (VIT D DEFICIENCY, FRACTURES): VITD: 59.88 ng/mL (ref 30.00–100.00)

## 2024-04-08 NOTE — Progress Notes (Signed)
 Patient ID: Heather Merritt, female   DOB: [birth year], 69 y.o.   MRN: [id number]         Chief Complaint:: yearly exam       HPI:  Heather Merritt is a 69 y.o. female here overall doing ok, Pt denies chest pain, increased sob or doe, wheezing, orthopnea, PND, increased LE swelling, palpitations, dizziness or syncope.   Pt denies polydipsia, polyuria, or new focal neuro s/s.    Pt denies fever, wt loss, night sweats, loss of appetite, or other constitutional symptoms.  In fact,  Gained wt with back pain and less gym time but plans to improve.  Due for flu shot  Denies worsening reflux, abd pain, dysphagia, n/v, bowel change or blood.  Wt Readings from Last 3 Encounters:  04/08/24 157 lb (71.2 kg)  11/22/23 151 lb 4 oz (68.6 kg)  11/20/23 149 lb (67.6 kg)   BP Readings from Last 3 Encounters:  04/08/24 120/78  11/22/23 110/68  10/09/23 118/68   Immunization History  Administered Date(s) Administered   Fluad Quad(high Dose 65+) 02/15/2020, 03/07/2022   INFLUENZA, HIGH DOSE SEASONAL PF 01/21/2019, 04/08/2024   Influenza Split 01/01/2011   Influenza, Seasonal, Injecte, Preservative Fre 03/01/2014   Influenza,inj,Quad PF,6+ Mos 04/14/2013   PFIZER(Purple Top)SARS-COV-2 Vaccination 04/26/2019, 05/18/2019, 03/10/2020   Pfizer Covid-19 Vaccine Bivalent Booster 30yrs & up 01/31/2021   Pneumococcal Polysaccharide-23 04/04/2022   Td 06/24/2003, 04/02/2004   Td (Adult), 2 Lf Tetanus Toxid, Preservative Free 06/24/2003   Tdap 06/05/2016   Zoster Recombinant(Shingrix) 12/22/2017, 04/02/2018, 07/05/2018   Health Maintenance Due  Topic Date Due   Diabetic kidney evaluation - Urine ACR  Never done   Pneumococcal Vaccine: 50+ Years (2 of 2 - PCV) 04/05/2023      Past Medical History:  Diagnosis Date   Allergy    SEASONAL   Angio-edema    Arthritis    CAD (coronary artery disease), native coronary artery    a. Cath 10/06/2018 - S/p DES to [facility]; medical therapy for high grade ostial/pro 2nd  digonal    Cataracts, bilateral    immature   Chronic back pain    Coronary artery disease    DIVERTICULOSIS, COLON 10/28/2007   Dry skin    red patch on right knee area   Gallstones    GERD 10/28/2007   takes Prevacid  daily   H/O hiatal hernia    HLD (hyperlipidemia) 12/12/2017   Hx of colonic polyps    HYPERLIPIDEMIA 10/28/2007   unable to take meds d/t allergies   HYPERTENSION 10/28/2007   takes Diovan  daily   Internal hemorrhoids    Irritable bowel syndrome (IBS)    Joint pain    Joint swelling    LIVER FUNCTION TESTS, ABNORMAL, HX OF 10/29/2007   Lumbar disc disease    Myocardial infarction (HCC)    Obesity    OSTEOPENIA 10/29/2007   Osteoporosis    PONV (postoperative nausea and vomiting)    with 2013  neck surgery   Presbyacusis 01/04/2010   Rosacea    Seasonal allergies    takes Clarinex  daily   Vertigo    hx of   Vitamin D  deficiency    takes Vit D daily   Past Surgical History:  Procedure Laterality Date   ANTERIOR CERVICAL DECOMP/DISCECTOMY FUSION  08/02/2011   Procedure: ANTERIOR CERVICAL DECOMPRESSION/DISCECTOMY FUSION 3 LEVELS;  Surgeon: Arley SHAUNNA Helling, MD;  Location: [facility];  Service: Neurosurgery;  Laterality: N/A;  Cervical Three-Four, Cervical  Four-Five, Cervical Five-Six  Anterior Cervical Decompression Fusion    CARDIAC CATHETERIZATION     CHOLECYSTECTOMY N/A 01/25/2023   Procedure: LAPAROSCOPIC CHOLECYSTECTOMY;  Surgeon: Stevie Herlene Righter, MD;  Location: [facility];  Service: General;  Laterality: N/A;   COLONOSCOPY  2001/2013   CORONARY STENT INTERVENTION N/A 10/06/2018   Procedure: CORONARY STENT INTERVENTION;  Surgeon: Court Dorn PARAS, MD;  [facility]: [facility];  Service: Cardiovascular;  Laterality: N/A;   ESOPHAGOGASTRODUODENOSCOPY     LEFT HEART CATH AND CORONARY ANGIOGRAPHY N/A 10/06/2018   Procedure: LEFT HEART CATH AND CORONARY ANGIOGRAPHY;  Surgeon: Court Dorn PARAS, MD;  Location: [facility];  Service: Cardiovascular;   Laterality: N/A;   POLYPECTOMY     TONSILLECTOMY     TONSILLECTOMY  at age 39    reports that she quit smoking about 31 years ago. Her smoking use included cigarettes. She started smoking about 51 years ago. She has a 20 pack-year smoking history. She has never used smokeless tobacco. She reports that she does not currently use alcohol. She reports that she does not use drugs. family history includes Atrial fibrillation in her father and mother; Diabetes in her maternal grandmother; Heart disease in her mother; Hyperlipidemia in her mother; Hypothyroidism in her maternal grandmother and sister; Stroke in her mother. Allergies[1] Medications Ordered Prior to Encounter[2]      ROS:  All others reviewed and negative.  Objective        PE:  BP 120/78 (BP Location: Left Arm, Patient Position: Sitting, Cuff Size: Normal)   Pulse 60   Temp 98.6 F (37 C) (Oral)   Ht 5' 3.5 (1.613 m)   Wt 157 lb (71.2 kg)   SpO2 98%   BMI 27.38 kg/m                 Constitutional: Pt appears in NAD               HENT: Head: NCAT.                Right Ear: External ear normal.                 Left Ear: External ear normal.                Eyes: . Pupils are equal, round, and reactive to light. Conjunctivae and EOM are normal               Nose: without d/c or deformity               Neck: Neck supple. Gross normal ROM               Cardiovascular: Normal rate and regular rhythm.                 Pulmonary/Chest: Effort normal and breath sounds without rales or wheezing.                Abd:  Soft, NT, ND, + BS, no organomegaly               Neurological: Pt is alert. At baseline orientation, motor grossly intact               Skin: Skin is warm. No rashes, no other new lesions, LE edema - none               Psychiatric: Pt behavior is normal without agitation   Micro: none  Cardiac tracings  I have personally interpreted today:  none  Pertinent Radiological findings (summarize): none   Lab Results   Component Value Date   WBC 3.0 (L) 04/08/2024   HGB 13.6 04/08/2024   HCT 40.4 04/08/2024   PLT 309.0 04/08/2024   GLUCOSE 94 04/08/2024   CHOL 129 04/08/2024   TRIG 122.0 04/08/2024   HDL 52.60 04/08/2024   LDLDIRECT 177.0 06/05/2017   LDLCALC 52 04/08/2024   ALT 33 04/08/2024   AST 33 04/08/2024   NA 139 04/08/2024   K 4.1 04/08/2024   CL 100 04/08/2024   CREATININE 0.65 04/08/2024   BUN 21 04/08/2024   CO2 32 04/08/2024   TSH 1.74 04/08/2024   INR 0.9 10/03/2018   HGBA1C 5.4 04/08/2024   Assessment/Plan:  TASHIBA TIMONEY is a 69 y.o. White or Caucasian [1] female with  has a past medical history of Allergy, Angio-edema, Arthritis, CAD (coronary artery disease), native coronary artery, Cataracts, bilateral, Chronic back pain, Coronary artery disease, DIVERTICULOSIS, COLON (10/28/2007), Dry skin, Gallstones, GERD (10/28/2007), H/O hiatal hernia, HLD (hyperlipidemia) (12/12/2017), colonic polyps, HYPERLIPIDEMIA (10/28/2007), HYPERTENSION (10/28/2007), Internal hemorrhoids, Irritable bowel syndrome (IBS), Joint pain, Joint swelling, LIVER FUNCTION TESTS, ABNORMAL, HX OF (10/29/2007), Lumbar disc disease, Myocardial infarction (HCC), Obesity, OSTEOPENIA (10/29/2007), Osteoporosis, PONV (postoperative nausea and vomiting), Presbyacusis (01/04/2010), Rosacea, Seasonal allergies, Vertigo, and Vitamin D  deficiency.  HLD (hyperlipidemia) Lab Results  Component Value Date   LDLCALC 52 04/08/2024   Stable, pt to continue current statin lipitor 10 mg qd   Essential (primary) hypertension BP Readings from Last 3 Encounters:  04/08/24 120/78  11/22/23 110/68  10/09/23 118/68   Stable, pt to continue medical treatment lopressor  12.5 bid, diovan  hct 160 12.5  qd   Gastro-esophageal reflux Stable overall, cont pepcid  40 qd Followup: Return in about 6 months (around 10/06/2024).  Lynwood Rush, MD 04/08/2024 8:56 PM [facility] Internal  Medicine     [1]  Allergies Allergen Reactions   Sulfur Hives   Penicillins Hives, Itching and Rash    Did it involve swelling of the face/tongue/throat, SOB, or low BP? Unknown Did it involve sudden or severe rash/hives, skin peeling, or any reaction on the inside of your mouth or nose? Unknown Did you need to seek medical attention at a hospital or doctor's office? Unknown When did it last happen?      pt cant recall If all above answers are NO, may proceed with cephalosporin use.    Rosuvastatin Other (See Comments)    REACTION: muscle weakness   Simvastatin Other (See Comments)    REACTION: muscle weakness   Statins Other (See Comments)    Myalgias and weakness   Sulfonamide Derivatives Other (See Comments)    Nausea, vomiting, abd pain   Avocado Other (See Comments)   Evolocumab  Swelling and Other (See Comments)    Swelling, itchy eyes, mouth swelling, red/puffy/hot face   Misc. Sulfonamide Containing Compounds Other (See Comments)   Other    Penicillin V Potassium Other (See Comments)   Brilinta  [Ticagrelor ] Rash    Itchy rash to torso   Doxycycline Itching and Rash   Doxycycline Hyclate Itching and Rash   Sulfa Antibiotics Other (See Comments)    Nausea, vomiting, abd pain  [2]  Current Outpatient Medications on File Prior to Visit  Medication Sig Dispense Refill   aspirin  81 MG tablet Take 81 mg by mouth daily.     atorvastatin  (LIPITOR) 10 MG tablet Take  1 tablet (10 mg total) by mouth daily. 90 tablet 3   Bempedoic Acid  (NEXLETOL ) 180 MG TABS Take 1 tablet (180 mg total) by mouth daily. 90 tablet 3   Calcium  Citrate (CITRACAL PO) Take 2 tablets by mouth daily.      Cholecalciferol  (VITAMIN D ) 2000 UNITS CAPS Take 2,000 Units by mouth daily.     clopidogrel  (PLAVIX ) 75 MG tablet Take 1 tablet (75 mg total) by mouth daily. 90 tablet 3   EPINEPHrine  0.3 mg/0.3 mL IJ SOAJ injection Inject 0.3 mg into the muscle as needed for anaphylaxis. 2 each 1   esomeprazole   (NEXIUM ) 40 MG capsule Take 1 capsule (40 mg total) by mouth in the morning and at bedtime. 180 capsule 3   estradiol (ESTRACE) 0.1 MG/GM vaginal cream Place 1 Applicatorful vaginally every other day.     ezetimibe  (ZETIA ) 10 MG tablet Take 1 tablet (10 mg total) by mouth daily. 90 tablet 3   famotidine  (PEPCID ) 40 MG tablet Take 1 tablet (40 mg total) by mouth daily. 90 tablet 3   metoprolol  tartrate (LOPRESSOR ) 25 MG tablet Take 0.5 tablets (12.5 mg total) by mouth 2 (two) times daily. 90 tablet 3   metroNIDAZOLE  (METROGEL ) 0.75 % gel Apply topically.     Multiple Vitamin (MULITIVITAMIN WITH MINERALS) TABS Take 1 tablet by mouth daily.     nitroGLYCERIN  (NITROSTAT ) 0.4 MG SL tablet Place 1 tablet (0.4 mg total) under the tongue every 5 (five) minutes x 3 doses as needed for chest pain. 25 tablet 6   pimecrolimus  (ELIDEL ) 1 % cream Apply 1 application topically every morning. 30 g 0   valsartan -hydrochlorothiazide  (DIOVAN -HCT) 160-12.5 MG tablet Take 1 tablet by mouth daily. 90 tablet 3   No current facility-administered medications on file prior to visit.

## 2024-04-08 NOTE — Assessment & Plan Note (Signed)
 BP Readings from Last 3 Encounters:  04/08/24 120/78  11/22/23 110/68  10/09/23 118/68   Stable, pt to continue medical treatment lopressor  12.5 bid, diovan  hct 160 12.5  qd

## 2024-04-08 NOTE — Patient Instructions (Signed)
 You had the flu shot today  Please continue all other medications as before, and refills have been done if requested.  Please have the pharmacy call with any other refills you may need.  Please continue your efforts at being more active, low cholesterol diet, and weight control.  You are otherwise up to date with prevention measures today.  Please keep your appointments with your specialists as you may have planned  Please go to the LAB at the blood drawing area for the tests to be done  You will be contacted by phone if any changes need to be made immediately.  Otherwise, you will receive a letter about your results with an explanation, but please check with MyChart first.  Please make an Appointment to return in 6 months, or sooner if needed

## 2024-04-08 NOTE — Progress Notes (Signed)
 The test results show that your current treatment is OK, as the tests are stable.  Please continue the same plan.  There is no other need for change of treatment or further evaluation based on these results, at this time.  thanks

## 2024-04-08 NOTE — Assessment & Plan Note (Signed)
 Lab Results  Component Value Date   LDLCALC 52 04/08/2024   Stable, pt to continue current statin lipitor 10 mg qd

## 2024-04-08 NOTE — Assessment & Plan Note (Signed)
 Stable overall, cont pepcid  40 qd

## 2024-04-26 ENCOUNTER — Other Ambulatory Visit: Payer: Self-pay | Admitting: Internal Medicine

## 2024-04-29 ENCOUNTER — Other Ambulatory Visit: Payer: Self-pay | Admitting: Cardiovascular Disease

## 2024-04-30 ENCOUNTER — Telehealth: Payer: Self-pay | Admitting: Pharmacy Technician

## 2024-04-30 NOTE — Telephone Encounter (Signed)
 Pharmacy Patient Advocate Encounter   Received notification from CoverMyMeds that prior authorization for nexletol  is required/requested.   Insurance verification completed.   The patient is insured through [employer].   Per test claim: PA required; PA submitted to above mentioned insurance via Latent Key/confirmation #/EOC [id number] Status is pending

## 2024-04-30 NOTE — Telephone Encounter (Signed)
 Pharmacy Patient Advocate Encounter  Received notification from [employer] that Prior Authorization for nexletol  has been APPROVED from 04/02/24 to 04/01/25   PA #/Case ID/Reference #: [id number]

## 2024-05-01 ENCOUNTER — Telehealth: Payer: Self-pay | Admitting: Cardiovascular Disease

## 2024-05-01 ENCOUNTER — Other Ambulatory Visit: Payer: Self-pay | Admitting: Cardiovascular Disease

## 2024-05-01 ENCOUNTER — Other Ambulatory Visit: Payer: Self-pay

## 2024-05-01 ENCOUNTER — Other Ambulatory Visit: Payer: Self-pay | Admitting: Internal Medicine

## 2024-05-01 MED ORDER — ESOMEPRAZOLE MAGNESIUM 40 MG PO CPDR: 40.0000 mg | DELAYED_RELEASE_CAPSULE | Freq: Two times a day (BID) | ORAL | 3 refills | Status: AC

## 2024-05-01 MED ORDER — PIMECROLIMUS 1 % EX CREA: 1.0000 | TOPICAL_CREAM | Freq: Every morning | CUTANEOUS | 0 refills | Status: AC

## 2024-05-01 MED ORDER — VALSARTAN-HYDROCHLOROTHIAZIDE 160-12.5 MG PO TABS: 1.0000 | ORAL_TABLET | Freq: Every day | ORAL | 3 refills | Status: AC

## 2024-05-01 MED ORDER — CLOPIDOGREL BISULFATE 75 MG PO TABS: 75.0000 mg | ORAL_TABLET | Freq: Every day | ORAL | 3 refills | Status: AC

## 2024-05-01 NOTE — Telephone Encounter (Signed)
" °*  STAT* If patient is at the pharmacy, call can be transferred to refill team.   1. Which medications need to be refilled? (please list name of each medication and dose if known)   clopidogrel  (PLAVIX ) 75 MG tablet    metoprolol  tartrate (LOPRESSOR ) 25 MG tablet    2. Which pharmacy/location (including street and city if local pharmacy) is medication to be sent to? [facility] - [place], [place] - [address]   3. Do they need a 30 day or 90 day supply? 90   "

## 2024-05-01 NOTE — Telephone Encounter (Unsigned)
 Copied from CRM #[id number]. Topic: Clinical - Medication Refill >> May 01, 2024 12:11 PM Mercedes MATSU wrote: Medication: valsartan -hydrochlorothiazide  (DIOVAN -HCT) 160-12.5 MG tablet  pimecrolimus  (ELIDEL ) 1 % cream   esomeprazole  (NEXIUM ) 40 MG capsule  Has the patient contacted their pharmacy? Yes (Agent: If no, request that the patient contact the pharmacy for the refill. If patient does not wish to contact the pharmacy document the reason why and proceed with request.) (Agent: If yes, when and what did the pharmacy advise?)  This is the patient's preferred pharmacy:   [facility] - [place], [place] - [address] [place] [address] Phone: [phone] Fax: [phone]  Is this the correct pharmacy for this prescription? Yes If no, delete pharmacy and type the correct one.   Has the prescription been filled recently? Yes  Is the patient out of the medication? Yes  Has the patient been seen for an appointment in the last year OR does the patient have an upcoming appointment? Yes  Can we respond through MyChart? Yes  Agent: Please be advised that Rx refills may take up to 3 business days. We ask that you follow-up with your pharmacy.

## 2024-05-04 ENCOUNTER — Ambulatory Visit: Admitting: Cardiovascular Disease

## 2024-05-04 MED ORDER — METOPROLOL TARTRATE 25 MG PO TABS: 12.5000 mg | ORAL_TABLET | Freq: Two times a day (BID) | ORAL | 3 refills | Status: AC

## 2024-05-04 MED ORDER — CLOPIDOGREL BISULFATE 75 MG PO TABS: 75.0000 mg | ORAL_TABLET | Freq: Every day | ORAL | 1 refills | Status: AC

## 2024-06-01 ENCOUNTER — Ambulatory Visit: Admitting: Cardiovascular Disease

## 2024-07-13 ENCOUNTER — Encounter: Admitting: Internal Medicine

## 2024-08-26 ENCOUNTER — Ambulatory Visit: Admitting: Family Medicine

## 2024-11-20 ENCOUNTER — Ambulatory Visit
